# Patient Record
Sex: Male | Born: 1945
Health system: Southern US, Community
[De-identification: ages and names within clinical notes are randomized; demographics above are authoritative.]

## PROBLEM LIST (undated history)

## (undated) DIAGNOSIS — N529 Male erectile dysfunction, unspecified: Secondary | ICD-10-CM

## (undated) DIAGNOSIS — IMO0002 Reserved for concepts with insufficient information to code with codable children: Secondary | ICD-10-CM

## (undated) DIAGNOSIS — F329 Major depressive disorder, single episode, unspecified: Secondary | ICD-10-CM

## (undated) DIAGNOSIS — F32A Depression, unspecified: Secondary | ICD-10-CM

## (undated) DIAGNOSIS — I639 Cerebral infarction, unspecified: Secondary | ICD-10-CM

## (undated) DIAGNOSIS — E785 Hyperlipidemia, unspecified: Secondary | ICD-10-CM

## (undated) DIAGNOSIS — R911 Solitary pulmonary nodule: Secondary | ICD-10-CM

## (undated) DIAGNOSIS — I480 Paroxysmal atrial fibrillation: Secondary | ICD-10-CM

## (undated) DIAGNOSIS — I10 Essential (primary) hypertension: Secondary | ICD-10-CM

## (undated) HISTORY — DX: Essential (primary) hypertension: I10

## (undated) HISTORY — DX: Solitary pulmonary nodule: R91.1

## (undated) HISTORY — DX: Depression, unspecified: F32.A

## (undated) HISTORY — DX: Cerebral infarction, unspecified: I63.9

## (undated) HISTORY — DX: Major depressive disorder, single episode, unspecified: F32.9

## (undated) HISTORY — DX: Paroxysmal atrial fibrillation: I48.0

## (undated) HISTORY — DX: Male erectile dysfunction, unspecified: N52.9

## (undated) HISTORY — DX: Hyperlipidemia, unspecified: E78.5

## (undated) HISTORY — DX: Reserved for concepts with insufficient information to code with codable children: IMO0002

---

## 1999-06-01 ENCOUNTER — Encounter (INDEPENDENT_AMBULATORY_CARE_PROVIDER_SITE_OTHER): Payer: Self-pay | Admitting: Specialist

## 1999-06-01 ENCOUNTER — Ambulatory Visit (HOSPITAL_COMMUNITY): Admission: RE | Admit: 1999-06-01 | Discharge: 1999-06-01 | Payer: Self-pay | Admitting: *Deleted

## 2003-04-01 ENCOUNTER — Ambulatory Visit (HOSPITAL_COMMUNITY): Admission: RE | Admit: 2003-04-01 | Discharge: 2003-04-01 | Payer: Self-pay | Admitting: *Deleted

## 2004-04-13 ENCOUNTER — Ambulatory Visit: Payer: Self-pay | Admitting: Family Medicine

## 2004-04-25 ENCOUNTER — Ambulatory Visit: Payer: Self-pay | Admitting: Family Medicine

## 2004-08-23 ENCOUNTER — Ambulatory Visit: Payer: Self-pay | Admitting: Family Medicine

## 2004-09-12 ENCOUNTER — Ambulatory Visit: Payer: Self-pay | Admitting: Family Medicine

## 2004-10-12 ENCOUNTER — Ambulatory Visit: Payer: Self-pay | Admitting: Family Medicine

## 2005-01-31 ENCOUNTER — Ambulatory Visit: Payer: Self-pay | Admitting: Family Medicine

## 2005-04-20 ENCOUNTER — Ambulatory Visit: Payer: Self-pay | Admitting: Family Medicine

## 2005-04-27 ENCOUNTER — Ambulatory Visit: Payer: Self-pay | Admitting: Family Medicine

## 2005-05-18 ENCOUNTER — Ambulatory Visit: Payer: Self-pay | Admitting: Internal Medicine

## 2005-07-06 ENCOUNTER — Ambulatory Visit: Payer: Self-pay | Admitting: Family Medicine

## 2006-03-26 ENCOUNTER — Ambulatory Visit: Payer: Self-pay | Admitting: Family Medicine

## 2006-04-22 ENCOUNTER — Ambulatory Visit: Payer: Self-pay | Admitting: Family Medicine

## 2006-04-22 LAB — CONVERTED CEMR LAB
Albumin: 3.6 g/dL (ref 3.5–5.2)
Basophils Absolute: 0.1 10*3/uL (ref 0.0–0.1)
Bilirubin, Direct: 0.1 mg/dL (ref 0.0–0.3)
Calcium: 9.2 mg/dL (ref 8.4–10.5)
Eosinophils Absolute: 0.7 10*3/uL — ABNORMAL HIGH (ref 0.0–0.6)
Eosinophils Relative: 10.2 % — ABNORMAL HIGH (ref 0.0–5.0)
GFR calc Af Amer: 98 mL/min
GFR calc non Af Amer: 81 mL/min
Glucose, Bld: 100 mg/dL — ABNORMAL HIGH (ref 70–99)
HDL: 32.3 mg/dL — ABNORMAL LOW (ref >39.0)
Lymphocytes Relative: 30.9 % (ref 12.0–46.0)
MCHC: 35.9 g/dL (ref 30.0–36.0)
MCV: 82.7 fL (ref 78.0–100.0)
Neutro Abs: 3.8 10*3/uL (ref 1.4–7.7)
Neutrophils Relative %: 52.3 % (ref 43.0–77.0)
PSA: 1.14 ng/mL (ref 0.10–4.00)
Platelets: 231 10*3/uL (ref 150–400)
Sodium: 146 meq/L — ABNORMAL HIGH (ref 135–145)
TSH: 1.69 microintl units/mL (ref 0.35–5.50)
Triglycerides: 249 mg/dL (ref 0–149)
WBC: 7.2 10*3/uL (ref 4.5–10.5)

## 2006-04-29 ENCOUNTER — Ambulatory Visit: Payer: Self-pay | Admitting: Family Medicine

## 2006-06-24 ENCOUNTER — Ambulatory Visit: Payer: Self-pay | Admitting: Family Medicine

## 2007-04-25 ENCOUNTER — Ambulatory Visit: Payer: Self-pay | Admitting: Family Medicine

## 2007-04-25 LAB — CONVERTED CEMR LAB
Albumin: 3.8 g/dL (ref 3.5–5.2)
Basophils Absolute: 0 10*3/uL (ref 0.0–0.1)
Creatinine, Ser: 1 mg/dL (ref 0.4–1.5)
Direct LDL: 97.7 mg/dL
Glucose, Urine, Semiquant: NEGATIVE
HCT: 43.6 % (ref 39.0–52.0)
Hemoglobin: 14.8 g/dL (ref 13.0–17.0)
Lymphocytes Relative: 25.2 % (ref 12.0–46.0)
MCHC: 34 g/dL (ref 30.0–36.0)
MCV: 85 fL (ref 78.0–100.0)
Monocytes Absolute: 0.5 10*3/uL (ref 0.2–0.7)
Neutrophils Relative %: 63.5 % (ref 43.0–77.0)
PSA: 1.63 ng/mL (ref 0.10–4.00)
Potassium: 4.8 meq/L (ref 3.5–5.1)
RDW: 12.6 % (ref 11.5–14.6)
Sodium: 142 meq/L (ref 135–145)
Specific Gravity, Urine: >=1.03
Total Bilirubin: 0.7 mg/dL (ref 0.3–1.2)
Total Protein: 6.6 g/dL (ref 6.0–8.3)
WBC Urine, dipstick: NEGATIVE
pH: 5

## 2007-05-02 ENCOUNTER — Ambulatory Visit: Payer: Self-pay | Admitting: Family Medicine

## 2007-05-02 DIAGNOSIS — F528 Other sexual dysfunction not due to a substance or known physiological condition: Secondary | ICD-10-CM

## 2007-05-02 DIAGNOSIS — E785 Hyperlipidemia, unspecified: Secondary | ICD-10-CM

## 2007-05-02 DIAGNOSIS — I493 Ventricular premature depolarization: Secondary | ICD-10-CM | POA: Insufficient documentation

## 2007-05-02 DIAGNOSIS — K219 Gastro-esophageal reflux disease without esophagitis: Secondary | ICD-10-CM

## 2007-07-02 DIAGNOSIS — J45909 Unspecified asthma, uncomplicated: Secondary | ICD-10-CM

## 2007-07-08 ENCOUNTER — Ambulatory Visit: Payer: Self-pay | Admitting: Family Medicine

## 2007-07-08 DIAGNOSIS — F329 Major depressive disorder, single episode, unspecified: Secondary | ICD-10-CM

## 2007-07-08 DIAGNOSIS — F3289 Other specified depressive episodes: Secondary | ICD-10-CM | POA: Insufficient documentation

## 2007-07-08 DIAGNOSIS — I10 Essential (primary) hypertension: Secondary | ICD-10-CM

## 2007-09-01 ENCOUNTER — Telehealth: Payer: Self-pay | Admitting: *Deleted

## 2007-10-09 ENCOUNTER — Encounter: Payer: Self-pay | Admitting: Family Medicine

## 2007-11-24 ENCOUNTER — Ambulatory Visit: Payer: Self-pay | Admitting: Family Medicine

## 2007-11-24 DIAGNOSIS — J45909 Unspecified asthma, uncomplicated: Secondary | ICD-10-CM | POA: Insufficient documentation

## 2008-04-27 ENCOUNTER — Ambulatory Visit: Payer: Self-pay | Admitting: Family Medicine

## 2008-04-27 LAB — CONVERTED CEMR LAB
Albumin: 3.8 g/dL (ref 3.5–5.2)
Alkaline Phosphatase: 67 units/L (ref 39–117)
BUN: 17 mg/dL (ref 6–23)
Basophils Absolute: 0 10*3/uL (ref 0.0–0.1)
Chloride: 104 meq/L (ref 96–112)
Direct LDL: 97.3 mg/dL
Eosinophils Absolute: 0.5 10*3/uL (ref 0.0–0.7)
Eosinophils Relative: 6.7 % — ABNORMAL HIGH (ref 0.0–5.0)
GFR calc Af Amer: 97 mL/min
GFR calc non Af Amer: 80 mL/min
Glucose, Urine, Semiquant: NEGATIVE
HCT: 41.3 % (ref 39.0–52.0)
MCHC: 34.9 g/dL (ref 30.0–36.0)
MCV: 84 fL (ref 78.0–100.0)
Monocytes Absolute: 0.5 10*3/uL (ref 0.1–1.0)
Neutrophils Relative %: 61.8 % (ref 43.0–77.0)
Nitrite: NEGATIVE
Platelets: 227 10*3/uL (ref 150–400)
Potassium: 4.3 meq/L (ref 3.5–5.1)
Protein, U semiquant: NEGATIVE
RDW: 12.4 % (ref 11.5–14.6)
Sodium: 145 meq/L (ref 135–145)
TSH: 1.68 microintl units/mL (ref 0.35–5.50)
Total Bilirubin: 0.6 mg/dL (ref 0.3–1.2)
Total CHOL/HDL Ratio: 5.8
Urobilinogen, UA: 0.2
VLDL: 47 mg/dL — ABNORMAL HIGH (ref 0–40)
WBC Urine, dipstick: NEGATIVE

## 2008-05-03 ENCOUNTER — Ambulatory Visit: Payer: Self-pay | Admitting: Family Medicine

## 2008-05-03 DIAGNOSIS — K921 Melena: Secondary | ICD-10-CM

## 2008-06-22 ENCOUNTER — Encounter: Payer: Self-pay | Admitting: Family Medicine

## 2008-06-22 ENCOUNTER — Ambulatory Visit: Payer: Self-pay | Admitting: Family Medicine

## 2008-06-22 DIAGNOSIS — L820 Inflamed seborrheic keratosis: Secondary | ICD-10-CM

## 2008-07-28 ENCOUNTER — Ambulatory Visit: Payer: Self-pay | Admitting: Family Medicine

## 2008-07-28 DIAGNOSIS — M5020 Other cervical disc displacement, unspecified cervical region: Secondary | ICD-10-CM

## 2008-07-29 ENCOUNTER — Ambulatory Visit: Payer: Self-pay | Admitting: Family Medicine

## 2008-08-03 ENCOUNTER — Ambulatory Visit: Payer: Self-pay | Admitting: Family Medicine

## 2008-08-13 ENCOUNTER — Encounter: Payer: Self-pay | Admitting: Family Medicine

## 2008-08-13 ENCOUNTER — Ambulatory Visit: Payer: Self-pay

## 2008-08-30 ENCOUNTER — Telehealth: Payer: Self-pay | Admitting: Family Medicine

## 2008-10-14 ENCOUNTER — Ambulatory Visit: Payer: Self-pay | Admitting: Family Medicine

## 2008-10-14 DIAGNOSIS — L259 Unspecified contact dermatitis, unspecified cause: Secondary | ICD-10-CM

## 2008-11-09 ENCOUNTER — Telehealth: Payer: Self-pay | Admitting: Family Medicine

## 2009-05-04 ENCOUNTER — Ambulatory Visit: Payer: Self-pay | Admitting: Family Medicine

## 2009-05-04 LAB — CONVERTED CEMR LAB
AST: 20 units/L (ref 0–37)
Albumin: 3.7 g/dL (ref 3.5–5.2)
Basophils Absolute: 0 10*3/uL (ref 0.0–0.1)
Bilirubin Urine: NEGATIVE
Blood in Urine, dipstick: NEGATIVE
CO2: 27 meq/L (ref 19–32)
Cholesterol: 152 mg/dL (ref 0–200)
Eosinophils Absolute: 0.6 10*3/uL (ref 0.0–0.7)
Glucose, Bld: 97 mg/dL (ref 70–99)
Glucose, Urine, Semiquant: NEGATIVE
HCT: 40.9 % (ref 39.0–52.0)
Hemoglobin: 13.6 g/dL (ref 13.0–17.0)
Lymphs Abs: 1.8 10*3/uL (ref 0.7–4.0)
MCHC: 33.3 g/dL (ref 30.0–36.0)
MCV: 87.6 fL (ref 78.0–100.0)
Monocytes Absolute: 0.4 10*3/uL (ref 0.1–1.0)
Neutro Abs: 4.4 10*3/uL (ref 1.4–7.7)
Potassium: 4 meq/L (ref 3.5–5.1)
RDW: 13.1 % (ref 11.5–14.6)
Sodium: 140 meq/L (ref 135–145)
Specific Gravity, Urine: 1.025
TSH: 1.72 microintl units/mL (ref 0.35–5.50)
WBC Urine, dipstick: NEGATIVE
pH: 5

## 2009-05-11 ENCOUNTER — Ambulatory Visit: Payer: Self-pay | Admitting: Family Medicine

## 2009-05-19 ENCOUNTER — Telehealth: Payer: Self-pay | Admitting: Family Medicine

## 2009-10-04 ENCOUNTER — Telehealth: Payer: Self-pay | Admitting: Family Medicine

## 2009-10-20 ENCOUNTER — Telehealth: Payer: Self-pay | Admitting: Family Medicine

## 2009-10-31 ENCOUNTER — Ambulatory Visit: Payer: Self-pay | Admitting: Family Medicine

## 2009-10-31 DIAGNOSIS — R51 Headache: Secondary | ICD-10-CM

## 2009-10-31 DIAGNOSIS — J309 Allergic rhinitis, unspecified: Secondary | ICD-10-CM | POA: Insufficient documentation

## 2009-11-14 ENCOUNTER — Ambulatory Visit: Payer: Self-pay | Admitting: Family Medicine

## 2009-12-20 ENCOUNTER — Ambulatory Visit: Payer: Self-pay | Admitting: Family Medicine

## 2010-01-18 ENCOUNTER — Ambulatory Visit: Payer: Self-pay | Admitting: Family Medicine

## 2010-02-22 ENCOUNTER — Telehealth: Payer: Self-pay | Admitting: Family Medicine

## 2010-03-31 ENCOUNTER — Ambulatory Visit
Admission: RE | Admit: 2010-03-31 | Discharge: 2010-03-31 | Payer: Self-pay | Source: Home / Self Care | Attending: Family Medicine | Admitting: Family Medicine

## 2010-04-06 NOTE — Assessment & Plan Note (Signed)
Summary: URI? // RS   Vital Signs:  Patient profile:   65 year old male Height:      65.5 inches Weight:      197 pounds BMI:     32.40 Temp:     98.7 degrees F oral BP sitting:   130 / 88  (left arm) Cuff size:   regular  Vitals Entered By: Kern Reap CMA Duncan Dull) (March 31, 2010 12:05 PM) CC: chest congestion, cough   CC:  chest congestion and cough.  History of Present Illness: Luke [name] is a 65 year old male, who comes in today with a two week history of wheezing and coughing.  He has a history of underlying allergic rhinitis and asthma and he is a hobby is shaggy, dancing.  He went into a establishment at [facility] to dance with a smoke and that triggered his asthma  Allergies: 1)  Aspirin (Aspirin) 2)  Avapro (Irbesartan)  Past History:  Past medical, surgical, family and social histories (including risk factors) reviewed for relevance to current acute and chronic problems.  Past Medical History: Reviewed history from 11/24/2007 and no changes required. 1980, bleeding, peptic ulcer Depression Hyperlipidemia Hypertension erectile dysfunction Asthma  Family History: Reviewed history from 05/02/2007 and no changes required. father died 61, coronary disease, peripheral vascular disease, and hyperlipidemia mother died 15, stroke, underlying hypertension, and peripheral vascular disease two brothers, one has hypertension.  The other has throat cancer and coronary disease  Social History: Reviewed history from 05/03/2008 and no changes required. Occupation: [employer] in [place] going to retire the end of this month Married Never Smoked Alcohol use-no Drug use-no Regular exercise-yes  Review of Systems      See HPI  Physical Exam  General:  Well-developed,well-nourished,in no acute distress; alert,appropriate and cooperative throughout examination Lungs:  symmetrical breath sounds delayed expiratory wheezing bilaterally   Impression &  Recommendations:  Problem # 1:  ASTHMA (ICD-493.90) Assessment Deteriorated  His updated medication list for this problem includes:    Prednisone 20 Mg Tabs (Prednisone) ..... Uad  Complete Medication List: 1)  Citalopram Hydrobromide 20 Mg Tabs (Citalopram hydrobromide) .... 1/2 once daily 2)  Simvastatin 20 Mg Tabs (Simvastatin) .... Take 1 tablet by mouth once a day 3)  Acyclovir 200 Mg/84ml Susp (Acyclovir) .... Take one tab by mouth once daily 4)  Flonase 50 Mcg/act Susp (Fluticasone propionate) .... Uad 5)  Cozaar 100 Mg Tabs (Losartan potassium) .... Take 1 tablet by mouth every morning 6)  Norvasc 5 Mg Tabs (Amlodipine besylate) .... Take 1 tablet by mouth every morning 7)  Hydrochlorothiazide 25 Mg Tabs (Hydrochlorothiazide) .... Take 1 tablet by mouth every morning as needed 8)  Prednisone 20 Mg Tabs (Prednisone) .... Uad  Patient Instructions: 1)  please reschedule.  His physical examination for the second week in March. 2)  Prednisone two tabs x 3 days, one x 3 days, a half x 3 days, then half a tablet Monday, Wednesday, Friday, for a two week taper.  Return p.r.n. Prescriptions: PREDNISONE 20 MG TABS (PREDNISONE) UAD  #50 x 1   Entered and Authorized by:   Roderick Pee MD   Signed by:   Roderick Pee MD on 03/31/2010   Method used:   Electronically to        [facility]* (retail)       [address]       [place], [place]  [address]       Ph: [phone]  Fax: [phone]   RxID:   [id number]    Orders Added: 1)  Est. Patient Level III [66440]

## 2010-04-06 NOTE — Progress Notes (Signed)
Summary: refill  Phone Note Refill Request Message from:  Fax from Pharmacy on October 04, 2009 5:30 PM  Refills Requested: Medication #1:  ZOVIRAX 200 MG  CAPS Take 1 capsule by mouth once a day Initial call taken by: Kern Reap CMA Duncan Dull),  October 04, 2009 5:30 PM    Prescriptions: ZOVIRAX 200 MG  CAPS (ACYCLOVIR) Take 1 capsule by mouth once a day  #100 x 4   Entered by:   Kern Reap CMA (AAMA)   Authorized by:   Roderick Pee MD   Signed by:   Kern Reap CMA (AAMA) on 10/04/2009   Method used:   Faxed to ...       [facility]* (retail)             ,          Ph: [phone]       Fax: [phone]   RxID:   [id number]

## 2010-04-06 NOTE — Progress Notes (Signed)
Summary: REFILLS  Phone Note Refill Request Message from:  Fax from Pharmacy  Refills Requested: Medication #1:  COZAAR 50 MG TABS Take 1 tablet by mouth every morning.   Brand Name Necessary? No  Medication #2:  ZOVIRAX 200 MG  CAPS Take 1 capsule by mouth once a day  Medication #3:  CITALOPRAM HYDROBROMIDE 20 MG  TABS 1/2 once daily  Medication #4:  SIMVASTATIN 20 MG  TABS Take 1 tablet by mouth once a day [facility] FAX---[phone]  Initial call taken by: Warnell Forester,  May 19, 2009 3:22 PM    Prescriptions: CITALOPRAM HYDROBROMIDE 20 MG  TABS (CITALOPRAM HYDROBROMIDE) 1/2 once daily  #50 x 4   Entered by:   Kern Reap CMA (AAMA)   Authorized by:   Roderick Pee MD   Signed by:   Kern Reap CMA (AAMA) on 05/19/2009   Method used:   Electronically to        [facility]* (mail-order)             ,          Ph: [phone]       Fax: [phone]   RxID:   [id number] COZAAR 50 MG TABS (LOSARTAN POTASSIUM) Take 1 tablet by mouth every morning  #100 x 3   Entered by:   Kern Reap CMA (AAMA)   Authorized by:   Roderick Pee MD   Signed by:   Kern Reap CMA (AAMA) on 05/19/2009   Method used:   Electronically to        [facility]* (mail-order)             ,          Ph: [phone]       Fax: [phone]   RxID:   [id number] SIMVASTATIN 20 MG  TABS (SIMVASTATIN) Take 1 tablet by mouth once a day  #100 x 4   Entered by:   Kern Reap CMA (AAMA)   Authorized by:   Roderick Pee MD   Signed by:   Kern Reap CMA (AAMA) on 05/19/2009   Method used:   Electronically to        [facility]* (mail-order)             ,          Ph: [phone]       Fax: [phone]   RxID:   [id number] ZOVIRAX 200 MG  CAPS (ACYCLOVIR) Take 1 capsule by mouth once a day  #100 x 4   Entered by:   Kern Reap CMA (AAMA)   Authorized by:   Roderick Pee MD   Signed by:   Kern Reap CMA (AAMA) on 05/19/2009   Method used:    Electronically to        [facility]* (mail-order)             ,          Ph: [phone]       Fax: [phone]   RxID:   [id number]

## 2010-04-06 NOTE — Assessment & Plan Note (Signed)
Summary: ?SINUS INF/NJR   Vital Signs:  Patient profile:   65 year old male Weight:      193 pounds Temp:     99.1 degrees F oral BP sitting:   140 / 96  (left arm) Cuff size:   regular  Vitals Entered By: Kern Reap CMA Duncan Dull) (October 31, 2009 10:32 AM) CC: dull headaches   CC:  dull headaches.  History of Present Illness: Luke [name] is a 65 year old, married man nonsmoker, who comes in today with a 3 month history of bifrontal headaches.  He states about 3 months ago he noticed some pressure sensation in both frontal sinuses.  It's been constant since that time.  He rates the discomfort as a 3 on a scale of one to 10.  He sleeps well.  It doesn't wake him up at night nor does he have any neurologic symptoms.  He does have a history of allergic rhinitis.  Also he has trouble in the summer with air conditioning.  He states the air conditioning causes a lot of nasal congestion.  His blood pressures to Cozaar 50 mg daily.  BP today 140/96.  He takes his medication daily.  Again.  Neurologic review of systems negative  Allergies: 1)  Aspirin (Aspirin) 2)  Avapro (Irbesartan)  Past History:  Past medical, surgical, family and social histories (including risk factors) reviewed for relevance to current acute and chronic problems.  Past Medical History: Reviewed history from 11/24/2007 and no changes required. 1980, bleeding, peptic ulcer Depression Hyperlipidemia Hypertension erectile dysfunction Asthma  Family History: Reviewed history from 05/02/2007 and no changes required. father died 10, coronary disease, peripheral vascular disease, and hyperlipidemia mother died 80, stroke, underlying hypertension, and peripheral vascular disease two brothers, one has hypertension.  The other has throat cancer and coronary disease  Social History: Reviewed history from 05/03/2008 and no changes required. Occupation: [employer] in [place] going to retire the end of this  month Married Never Smoked Alcohol use-no Drug use-no Regular exercise-yes  Review of Systems      See HPI  Physical Exam  General:  Well-developed,well-nourished,in no acute distress; alert,appropriate and cooperative throughout examination Head:  Normocephalic and atraumatic without obvious abnormalities. No apparent alopecia or balding. Eyes:  No corneal or conjunctival inflammation noted. EOMI. Perrla. Funduscopic exam benign, without hemorrhages, exudates or papilledema. Vision grossly normal. Ears:  External ear exam shows no significant lesions or deformities.  Otoscopic examination reveals clear canals, tympanic membranes are intact bilaterally without bulging, retraction, inflammation or discharge. Hearing is grossly normal bilaterally. Nose:  the septum is in the midline is 3+ nasal edema bilaterally Mouth:  Oral mucosa and oropharynx without lesions or exudates.  Teeth in good repair. Neck:  No deformities, masses, or tenderness noted. Lungs:  Normal respiratory effort, chest expands symmetrically. Lungs are clear to auscultation, no crackles or wheezes. Neurologic:  No cranial nerve deficits noted. Station and gait are normal. Plantar reflexes are down-going bilaterally. DTRs are symmetrical throughout. Sensory, motor and coordinative functions appear intact.   Problems:  Medical Problems Added: 1)  Dx of Headache  (ICD-784.0) 2)  Dx of Rhinitis  (ICD-477.9)  Impression & Recommendations:  Problem # 1:  HYPERTENSION (ICD-401.9) Assessment Deteriorated  His updated medication list for this problem includes:    Cozaar 50 Mg Tabs (Losartan potassium) .Marland Kitchen... Take 1 tablet by mouth every morning  Problem # 2:  RHINITIS (ICD-477.9) Assessment: New  His updated medication list for this problem includes:    Flonase 50  Mcg/act Susp (Fluticasone propionate) ..... Uad  Problem # 3:  HEADACHE (ICD-784.0) Assessment: New  Complete Medication List: 1)  Citalopram  Hydrobromide 20 Mg Tabs (Citalopram hydrobromide) .... 1/2 once daily 2)  Simvastatin 20 Mg Tabs (Simvastatin) .... Take 1 tablet by mouth once a day 3)  Cozaar 50 Mg Tabs (Losartan potassium) .... Take 1 tablet by mouth every morning 4)  Acyclovir 200 Mg/64ml Susp (Acyclovir) .... Take one tab by mouth once daily 5)  Flonase 50 Mcg/act Susp (Fluticasone propionate) .... Uad  Patient Instructions: 1)  take 10 mg of plain Claritin at bedtime, and one shot of steroid nasal spray up each nostril at bedtime. 2)  Continued D. Cozaar one tablet daily in the morning.  Check your blood pressure twice daily.  Return in two weeks for follow-up. Prescriptions: FLONASE 50 MCG/ACT SUSP (FLUTICASONE PROPIONATE) UAD  #2 units x 6   Entered and Authorized by:   Roderick Pee MD   Signed by:   Roderick Pee MD on 10/31/2009   Method used:   Electronically to        [facility]* (retail)       [address]       [place], [place]  [address]       Ph: [phone]       Fax: [phone]   RxID:   [id number]

## 2010-04-06 NOTE — Assessment & Plan Note (Signed)
Summary: cpx/njr   Vital Signs:  Patient profile:   65 year old male Height:      65.5 inches Weight:      188 pounds BMI:     30.92 Temp:     98.5 degrees F oral BP sitting:   120 / 88  (left arm) Cuff size:   regular  Vitals Entered By: Kern Reap CMA Duncan Dull) (May 11, 2009 9:07 AM)  Reason for Visit cpx  History of Present Illness: niles is a 65 year old, married male, nonsmoker, who comes in today for evaluation of multiple issues.  He has recurrent HSV, for which he takes Zovirax, 200 mg daily asymptomatic on medication.  His history of mild depression, for which he takes Celexa 10 mg daily working well.  Feels well.  Mood good.  He takes simvastatin 20 mg nightly for hyperlipidemia.  Will check lipids.  He takes Benicar 20 mg daily for hypertension.  BP normal 120/88.  Will switch to Cozaar because of cost issue.  History tonight here dental care.  Colonoscopy normal.  Tetanus 2005.  He declines a seasonal flu shot.  He states he wants got the shot and then got the flu.  I explained to them why that was.  Advised annual flu shot.  Allergies: 1)  Aspirin (Aspirin) 2)  Avapro (Irbesartan)  Past History:  Past medical, surgical, family and social histories (including risk factors) reviewed, and no changes noted (except as noted below).  Past Medical History: Reviewed history from 11/24/2007 and no changes required. 1980, bleeding, peptic ulcer Depression Hyperlipidemia Hypertension erectile dysfunction Asthma  Family History: Reviewed history from 05/02/2007 and no changes required. father died 76, coronary disease, peripheral vascular disease, and hyperlipidemia mother died 85, stroke, underlying hypertension, and peripheral vascular disease two brothers, one has hypertension.  The other has throat cancer and coronary disease  Social History: Reviewed history from 05/03/2008 and no changes required. Occupation: [employer] in [place] going to retire the end of  this month Married Never Smoked Alcohol use-no Drug use-no Regular exercise-yes  Review of Systems      See HPI  Physical Exam  General:  Well-developed,well-nourished,in no acute distress; alert,appropriate and cooperative throughout examination Head:  Normocephalic and atraumatic without obvious abnormalities. No apparent alopecia or balding. Eyes:  No corneal or conjunctival inflammation noted. EOMI. Perrla. Funduscopic exam benign, without hemorrhages, exudates or papilledema. Vision grossly normal. Ears:  External ear exam shows no significant lesions or deformities.  Otoscopic examination reveals clear canals, tympanic membranes are intact bilaterally without bulging, retraction, inflammation or discharge. Hearing is grossly normal bilaterally. Nose:  External nasal examination shows no deformity or inflammation. Nasal mucosa are pink and moist without lesions or exudates. Mouth:  Oral mucosa and oropharynx without lesions or exudates.  Teeth in good repair. Neck:  No deformities, masses, or tenderness noted. Chest Wall:  No deformities, masses, tenderness or gynecomastia noted. Breasts:  No masses or gynecomastia noted Lungs:  Normal respiratory effort, chest expands symmetrically. Lungs are clear to auscultation, no crackles or wheezes. Heart:  Normal rate and regular rhythm. S1 and S2 normal without gallop, murmur, click, rub or other extra sounds. Abdomen:  Bowel sounds positive,abdomen soft and non-tender without masses, organomegaly or hernias noted. Rectal:  No external abnormalities noted. Normal sphincter tone. No rectal masses or tenderness. Genitalia:  Testes bilaterally descended without nodularity, tenderness or masses. No scrotal masses or lesions. No penis lesions or urethral discharge. Prostate:  Prostate gland firm and smooth, no enlargement,  nodularity, tenderness, mass, asymmetry or induration. Msk:  No deformity or scoliosis noted of thoracic or lumbar spine.     Pulses:  R and L carotid,radial,femoral,dorsalis pedis and posterior tibial pulses are full and equal bilaterally Extremities:  No clubbing, cyanosis, edema, or deformity noted with normal full range of motion of all joints.   Neurologic:  No cranial nerve deficits noted. Station and gait are normal. Plantar reflexes are down-going bilaterally. DTRs are symmetrical throughout. Sensory, motor and coordinative functions appear intact. Skin:  Intact without suspicious lesions or rashes Cervical Nodes:  No lymphadenopathy noted Axillary Nodes:  No palpable lymphadenopathy Inguinal Nodes:  No significant adenopathy Psych:  Cognition and judgment appear intact. Alert and cooperative with normal attention span and concentration. No apparent delusions, illusions, hallucinations   Impression & Recommendations:  Problem # 1:  HYPERTENSION (ICD-401.9) Assessment Improved  The following medications were removed from the medication list:    Benicar 40 Mg Tabs (Olmesartan medoxomil) ..... .1/2 qam His updated medication list for this problem includes:    Cozaar 50 Mg Tabs (Losartan potassium) .Marland Kitchen... Take 1 tablet by mouth every morning  Orders: Prescription Created Electronically [phone]) EKG w/ Interpretation (93000)  Problem # 2:  DEPRESSION (ICD-311) Assessment: Improved  His updated medication list for this problem includes:    Citalopram Hydrobromide 20 Mg Tabs (Citalopram hydrobromide) .Marland Kitchen... 1/2 once daily  Orders: Prescription Created Electronically [phone])  Problem # 3:  HYPERLIPIDEMIA (ICD-272.4) Assessment: Improved  His updated medication list for this problem includes:    Simvastatin 20 Mg Tabs (Simvastatin) .Marland Kitchen... Take 1 tablet by mouth once a day  Orders: Prescription Created Electronically [phone])  Problem # 4:  Preventive Health Care (ICD-V70.0) Assessment: Unchanged  Complete Medication List: 1)  Zovirax 200 Mg Caps (Acyclovir) .... Take 1 capsule by mouth once a  day 2)  Citalopram Hydrobromide 20 Mg Tabs (Citalopram hydrobromide) .... 1/2 once daily 3)  Simvastatin 20 Mg Tabs (Simvastatin) .... Take 1 tablet by mouth once a day 4)  Cozaar 50 Mg Tabs (Losartan potassium) .... Take 1 tablet by mouth every morning  Patient Instructions: 1)  Please schedule a follow-up appointment in 1 year. 2)  It is important that you exercise regularly at least 20 minutes 5 times a week. If you develop chest pain, have severe difficulty breathing, or feel very tired , stop exercising immediately and seek medical attention. 3)  Schedule a colonoscopy/sigmoidoscopy to help detect colon cancer. 4)  Take an Aspirin every day. Prescriptions: SIMVASTATIN 20 MG  TABS (SIMVASTATIN) Take 1 tablet by mouth once a day  #100 x 4   Entered and Authorized by:   Roderick Pee MD   Signed by:   Roderick Pee MD on 05/11/2009   Method used:   Electronically to        [facility]* (retail)       [address]       [place], [place]  [address]       Ph: [phone]       Fax: [phone]   RxID:   [id number] CITALOPRAM HYDROBROMIDE 20 MG  TABS (CITALOPRAM HYDROBROMIDE) 1/2 once daily  #50 x 4   Entered and Authorized by:   Roderick Pee MD   Signed by:   Roderick Pee MD on 05/11/2009   Method used:   Electronically to        [facility]* (retail)       [address]  [place], [place]  [address]       Ph: [phone]       Fax: [phone]   RxID:   [id number] ZOVIRAX 200 MG  CAPS (ACYCLOVIR) Take 1 capsule by mouth once a day  #100 x 4   Entered and Authorized by:   Roderick Pee MD   Signed by:   Roderick Pee MD on 05/11/2009   Method used:   Electronically to        [facility]* (retail)       [address]       [place], [place]  [address]       Ph: [phone]       Fax: [phone]   RxID:   [id number] COZAAR 50 MG TABS (LOSARTAN POTASSIUM) Take 1 tablet by mouth every morning  #100 x  3   Entered and Authorized by:   Roderick Pee MD   Signed by:   Roderick Pee MD on 05/11/2009   Method used:   Electronically to        [facility]* (retail)       [address]       [place], [place]  [address]       Ph: [phone]       Fax: [phone]   RxID:   [id number]

## 2010-04-06 NOTE — Progress Notes (Signed)
Summary: hctz refill  Phone Note Refill Request Message from:  Fax from Pharmacy on February 22, 2010 2:44 PM  Refills Requested: Medication #1:  HYDROCHLOROTHIAZIDE 25 MG TABS Take 1 tablet by mouth every morning as needed. Initial call taken by: Kern Reap CMA Duncan Dull),  February 22, 2010 2:44 PM    Prescriptions: HYDROCHLOROTHIAZIDE 25 MG TABS (HYDROCHLOROTHIAZIDE) Take 1 tablet by mouth every morning as needed  #100 x 2   Entered by:   Kern Reap CMA (AAMA)   Authorized by:   Roderick Pee MD   Signed by:   Kern Reap CMA (AAMA) on 02/22/2010   Method used:   Electronically to        [facility]* (retail)             ,          Ph: [phone]       Fax: [phone]   RxID:   [id number]

## 2010-04-06 NOTE — Assessment & Plan Note (Signed)
Summary: 1 month rov/njr----PT [facility] // RS   Vital Signs:  Patient profile:   65 year old male Weight:      197 pounds Temp:     98.8 degrees F oral BP sitting:   160 / 100  (left arm) Cuff size:   regular  Vitals Entered By: Kern Reap CMA Duncan Dull) (December 20, 2009 10:58 AM) CC: follow-up visit   CC:  follow-up visit.  History of Present Illness: Luke [name] comes in today for evaluation of hypertension.  We increased his Cozaar to 100 mg daily a couple weeks ago.  He is here for follow-up.  He states his blood pressure still elevated systolics running 140 to 160 diastolics running 85 to 95  Allergies: 1)  Aspirin (Aspirin) 2)  Avapro (Irbesartan)  Review of Systems      See HPI       Flu Vaccine Consent Questions     Do you have a history of severe allergic reactions to this vaccine? no    Any prior history of allergic reactions to egg and/or gelatin? no    Do you have a sensitivity to the preservative Thimersol? no    Do you have a past history of Guillan-Barre Syndrome? no    Do you currently have an acute febrile illness? no    Have you ever had a severe reaction to latex? no    Vaccine information given and explained to patient? yes    Are you currently pregnant? no    Lot Number:[id number]   Exp Date:09/02/2010   Site Given  Left Deltoid IM   Physical Exam  General:  Well-developed,well-nourished,in no acute distress; alert,appropriate and cooperative throughout examination   Impression & Recommendations:  Problem # 1:  HYPERTENSION (ICD-401.9) Assessment Unchanged  His updated medication list for this problem includes:    Cozaar 100 Mg Tabs (Losartan potassium) .Marland Kitchen... Take 1 tablet by mouth every morning    Norvasc 5 Mg Tabs (Amlodipine besylate) .Marland Kitchen... Take 1 tablet by mouth every morning  Complete Medication List: 1)  Citalopram Hydrobromide 20 Mg Tabs (Citalopram hydrobromide) .... 1/2 once daily 2)  Simvastatin 20 Mg Tabs (Simvastatin) .... Take 1 tablet  by mouth once a day 3)  Acyclovir 200 Mg/60ml Susp (Acyclovir) .... Take one tab by mouth once daily 4)  Flonase 50 Mcg/act Susp (Fluticasone propionate) .... Uad 5)  Cozaar 100 Mg Tabs (Losartan potassium) .... Take 1 tablet by mouth every morning 6)  Norvasc 5 Mg Tabs (Amlodipine besylate) .... Take 1 tablet by mouth every morning  Other Orders: Admin 1st Vaccine ([address]) Flu Vaccine 46yrs + (60454)  Patient Instructions: 1)  add Norvasc to 5 mg daily in the morning.  Check y  blood pressure daily in the morning.  Return in 4 weeks for follow-up 2)  Remember ............complete salt free diet Prescriptions: NORVASC 5 MG TABS (AMLODIPINE BESYLATE) Take 1 tablet by mouth every morning  #100 x 3   Entered and Authorized by:   Roderick Pee MD   Signed by:   Roderick Pee MD on 12/20/2009   Method used:   Electronically to        [facility]* (retail)       [address]       [place], [place]  [address]       Ph: [phone]       Fax: [phone]   RxID:   [id number]    Orders Added: 1)  Admin 1st Vaccine [  90471] 2)  Flu Vaccine 61yrs + [90658] 3)  Est. Patient Level III [16109]

## 2010-04-06 NOTE — Assessment & Plan Note (Signed)
Summary: 1 month rov/njr   Vital Signs:  Patient profile:   65 year old male Weight:      199 pounds Temp:     98.1 degrees F oral BP sitting:   130 / 92  (left arm) Cuff size:   regular  Vitals Entered By: Kern Reap CMA Duncan Dull) (January 18, 2010 8:24 AM) CC: follow-up visit   CC:  follow-up visit.  History of Present Illness: Luke [name] is a 65 year old, married male, nonsmoker, who comes in today for follow-up of hypertension.  He was on Cozaar 100 mg daily, however, was not holding his blood pressure.  On October the 18th we added Norvasc 5 mg daily.  Since that time.  His blood pressure slowly dropped and now is back to normal.  Averaging 130/80.  No side effects except for some occasional flushing and some occasional fluid retention.  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  Allergies: 1)  Aspirin (Aspirin) 2)  Avapro (Irbesartan)  Past History:  Past medical, surgical, family and social histories (including risk factors) reviewed for relevance to current acute and chronic problems.  Past Medical History: Reviewed history from 11/24/2007 and no changes required. 1980, bleeding, peptic ulcer Depression Hyperlipidemia Hypertension erectile dysfunction Asthma  Family History: Reviewed history from 05/02/2007 and no changes required. father died 75, coronary disease, peripheral vascular disease, and hyperlipidemia mother died 52, stroke, underlying hypertension, and peripheral vascular disease two brothers, one has hypertension.  The other has throat cancer and coronary disease  Social History: Reviewed history from 05/03/2008 and no changes required. Occupation: [employer] in [place] going to retire the end of this month Married Never Smoked Alcohol use-no Drug use-no Regular exercise-yes  Review of Systems      See HPI  Physical Exam  General:  Well-developed,well-nourished,in no acute distress; alert,appropriate and cooperative throughout  examination   Impression & Recommendations:  Problem # 1:  HYPERTENSION (ICD-401.9) Assessment Improved  His updated medication list for this problem includes:    Cozaar 100 Mg Tabs (Losartan potassium) .Marland Kitchen... Take 1 tablet by mouth every morning    Norvasc 5 Mg Tabs (Amlodipine besylate) .Marland Kitchen... Take 1 tablet by mouth every morning    Hydrochlorothiazide 25 Mg Tabs (Hydrochlorothiazide) .Marland Kitchen... Take 1 tablet by mouth every morning as needed  Complete Medication List: 1)  Citalopram Hydrobromide 20 Mg Tabs (Citalopram hydrobromide) .... 1/2 once daily 2)  Simvastatin 20 Mg Tabs (Simvastatin) .... Take 1 tablet by mouth once a day 3)  Acyclovir 200 Mg/24ml Susp (Acyclovir) .... Take one tab by mouth once daily 4)  Flonase 50 Mcg/act Susp (Fluticasone propionate) .... Uad 5)  Cozaar 100 Mg Tabs (Losartan potassium) .... Take 1 tablet by mouth every morning 6)  Norvasc 5 Mg Tabs (Amlodipine besylate) .... Take 1 tablet by mouth every morning 7)  Hydrochlorothiazide 25 Mg Tabs (Hydrochlorothiazide) .... Take 1 tablet by mouth every morning as needed  Patient Instructions: 1)  continue current medications.  Check your blood pressure daily in the morning ...........on Sunday 2)  If you get an elevated reading then check your blood pressure  daily for two weeks in a row.  If after two weeks [place]. [place]. all your blood pressures were back to normal then, just go back and check it  weekly.  If however, u  see a persistent elevation bring the data and the device and come back to see [place] so we can readjust y  medication Prescriptions: HYDROCHLOROTHIAZIDE 25 MG TABS (  HYDROCHLOROTHIAZIDE) Take 1 tablet by mouth every morning as needed  #100 x 2   Entered and Authorized by:   Roderick Pee MD   Signed by:   Roderick Pee MD on 01/18/2010   Method used:   Print then Give to Patient   RxID:   [id number]    Orders Added: 1)  Est. Patient Level III [25427]

## 2010-04-06 NOTE — Progress Notes (Signed)
Summary: rx change  Phone Note From Pharmacy   Summary of Call: patient would like to change from zovirax to acyclovir - [facility] Initial call taken by: Kern Reap CMA Duncan Dull),  October 20, 2009 1:54 PM    New/Updated Medications: ACYCLOVIR 200 MG/5ML SUSP (ACYCLOVIR) take one tab by mouth once daily

## 2010-04-06 NOTE — Assessment & Plan Note (Signed)
Summary: 2 week fup//ccm   Vital Signs:  Patient profile:   65 year old male Weight:      194 pounds Temp:     98.2 degrees F oral BP sitting:   130 / 90  (right arm) Cuff size:   regular  Vitals Entered By: Kathrynn Speed CMA (November 14, 2009 8:52 AM) CC: 2 wk fup BP & headaches, src Is Patient Diabetic? No   CC:  2 wk fup BP & headaches and src.  History of Present Illness: Luke [name] is a 65 year old, married male, nonsmoker, who comes in today for evaluation of two problems.  His blood pressure has been reading different numbers at home however, he has an old machine.  BP checked by me today 160/90 right arm sitting position on Cozaar 50 mg daily.  Will increase Cozaar 200 mg tablets daily.  Have him get a new digital blood pressure cuff, and follow-up in 4 weeks.  He also has underlying allergic rhinitis.  We added Flonase nasal spray in August.  His symptoms have markedly decreased.  No side effects from medication  Preventive Screening-Counseling & Management  Alcohol-Tobacco     Smoking Status: never     Passive Smoke Exposure: yes  Current Medications (verified): 1)  Citalopram Hydrobromide 20 Mg  Tabs (Citalopram Hydrobromide) .... 1/2 Once Daily 2)  Simvastatin 20 Mg  Tabs (Simvastatin) .... Take 1 Tablet By Mouth Once A Day 3)  Cozaar 50 Mg Tabs (Losartan Potassium) .... Take 1 Tablet By Mouth Every Morning 4)  Acyclovir 200 Mg/37ml Susp (Acyclovir) .... Take One Tab By Mouth Once Daily 5)  Flonase 50 Mcg/act Susp (Fluticasone Propionate) .... Uad  Allergies (verified): 1)  Aspirin (Aspirin) 2)  Avapro (Irbesartan)  Review of Systems      See HPI  Physical Exam  General:  Well-developed,well-nourished,in no acute distress; alert,appropriate and cooperative throughout examination Heart:  160/94 right arm sitting position   Impression & Recommendations:  Problem # 1:  HEADACHE (ICD-784.0) Assessment Unchanged  Orders: Prescription Created  Electronically [phone])  Problem # 2:  RHINITIS (ICD-477.9) Assessment: Improved  His updated medication list for this problem includes:    Flonase 50 Mcg/act Susp (Fluticasone propionate) ..... Uad  Orders: Prescription Created Electronically [id number])  Problem # 3:  HYPERTENSION (ICD-401.9) Assessment: Deteriorated  The following medications were removed from the medication list:    Cozaar 50 Mg Tabs (Losartan potassium) .Marland Kitchen... Take 1 tablet by mouth every morning His updated medication list for this problem includes:    Cozaar 100 Mg Tabs (Losartan potassium) .Marland Kitchen... Take 1 tablet by mouth every morning  Orders: Prescription Created Electronically [phone])  Complete Medication List: 1)  Citalopram Hydrobromide 20 Mg Tabs (Citalopram hydrobromide) .... 1/2 once daily 2)  Simvastatin 20 Mg Tabs (Simvastatin) .... Take 1 tablet by mouth once a day 3)  Acyclovir 200 Mg/58ml Susp (Acyclovir) .... Take one tab by mouth once daily 4)  Flonase 50 Mcg/act Susp (Fluticasone propionate) .... Uad 5)  Cozaar 100 Mg Tabs (Losartan potassium) .... Take 1 tablet by mouth every morning  Patient Instructions: 1)  continue her allergy medication. 2)  Increase the Cozaar to 100 mg daily check her blood pressure daily in the morning.  Return in 4 weeks with the data and the new digital pump up  device Prescriptions: COZAAR 100 MG TABS (LOSARTAN POTASSIUM) Take 1 tablet by mouth every morning  #100 x 3   Entered and Authorized by:   Roderick Pee MD  Signed by:   Roderick Pee MD on 11/14/2009   Method used:   Electronically to        [facility]* (retail)       [address]       [place], [place]  [id number]       Ph: [phone]       Fax: [phone]   RxID:   [id number]

## 2010-05-15 ENCOUNTER — Other Ambulatory Visit (INDEPENDENT_AMBULATORY_CARE_PROVIDER_SITE_OTHER): Payer: PRIVATE HEALTH INSURANCE | Admitting: Family Medicine

## 2010-05-15 DIAGNOSIS — E785 Hyperlipidemia, unspecified: Secondary | ICD-10-CM

## 2010-05-15 DIAGNOSIS — Z0000 Encounter for general adult medical examination without abnormal findings: Secondary | ICD-10-CM

## 2010-05-15 LAB — LIPID PANEL
HDL: 30.1 mg/dL — ABNORMAL LOW (ref >39.00)
Triglycerides: 238 mg/dL — ABNORMAL HIGH (ref 0.0–149.0)

## 2010-05-15 LAB — HEPATIC FUNCTION PANEL
ALT: 19 U/L (ref 0–53)
AST: 19 U/L (ref 0–37)
Alkaline Phosphatase: 64 U/L (ref 39–117)
Bilirubin, Direct: 0.1 mg/dL (ref 0.0–0.3)
Total Bilirubin: 0.5 mg/dL (ref 0.3–1.2)

## 2010-05-15 LAB — CBC WITH DIFFERENTIAL/PLATELET
Eosinophils Relative: 5.9 % — ABNORMAL HIGH (ref 0.0–5.0)
Lymphocytes Relative: 24.6 % (ref 12.0–46.0)
Monocytes Relative: 5.8 % (ref 3.0–12.0)
Neutrophils Relative %: 63.2 % (ref 43.0–77.0)
Platelets: 255 10*3/uL (ref 150.0–400.0)
WBC: 8.7 10*3/uL (ref 4.5–10.5)

## 2010-05-15 LAB — BASIC METABOLIC PANEL
BUN: 17 mg/dL (ref 6–23)
Creatinine, Ser: 1.1 mg/dL (ref 0.4–1.5)
GFR: 71.59 mL/min (ref >60.00)

## 2010-05-15 LAB — POCT URINALYSIS DIPSTICK
Ketones, UA: NEGATIVE
Protein, UA: NEGATIVE
Spec Grav, UA: 1.025

## 2010-05-15 LAB — LDL CHOLESTEROL, DIRECT: Direct LDL: 87 mg/dL

## 2010-05-15 LAB — PSA: PSA: 1.14 ng/mL (ref 0.10–4.00)

## 2010-05-16 ENCOUNTER — Other Ambulatory Visit: Payer: Self-pay

## 2010-05-16 ENCOUNTER — Encounter: Payer: Self-pay | Admitting: Family Medicine

## 2010-05-17 ENCOUNTER — Ambulatory Visit (INDEPENDENT_AMBULATORY_CARE_PROVIDER_SITE_OTHER): Payer: PRIVATE HEALTH INSURANCE | Admitting: Family Medicine

## 2010-05-17 ENCOUNTER — Encounter: Payer: Self-pay | Admitting: Family Medicine

## 2010-05-17 VITALS — BP 114/80 | HR 72 | Temp 98.5°F | Ht 66.0 in | Wt 196.0 lb

## 2010-05-17 DIAGNOSIS — F528 Other sexual dysfunction not due to a substance or known physiological condition: Secondary | ICD-10-CM

## 2010-05-17 DIAGNOSIS — B009 Herpesviral infection, unspecified: Secondary | ICD-10-CM

## 2010-05-17 DIAGNOSIS — I10 Essential (primary) hypertension: Secondary | ICD-10-CM

## 2010-05-17 DIAGNOSIS — F329 Major depressive disorder, single episode, unspecified: Secondary | ICD-10-CM

## 2010-05-17 DIAGNOSIS — J309 Allergic rhinitis, unspecified: Secondary | ICD-10-CM

## 2010-05-17 DIAGNOSIS — E785 Hyperlipidemia, unspecified: Secondary | ICD-10-CM

## 2010-05-17 MED ORDER — ACYCLOVIR 200 MG PO CAPS: 200.0000 mg | ORAL_CAPSULE | Freq: Every day | ORAL | Status: DC

## 2010-05-17 MED ORDER — SIMVASTATIN 20 MG PO TABS: 20.0000 mg | ORAL_TABLET | Freq: Every day | ORAL | Status: DC

## 2010-05-17 MED ORDER — ALBUTEROL SULFATE HFA 108 (90 BASE) MCG/ACT IN AERS: 2.0000 | INHALATION_SPRAY | Freq: Four times a day (QID) | RESPIRATORY_TRACT | Status: DC | PRN

## 2010-05-17 MED ORDER — CITALOPRAM HYDROBROMIDE 20 MG PO TABS: 10.0000 mg | ORAL_TABLET | Freq: Every day | ORAL | Status: DC

## 2010-05-17 MED ORDER — SILDENAFIL CITRATE 50 MG PO TABS: 50.0000 mg | ORAL_TABLET | ORAL | Status: DC | PRN

## 2010-05-17 MED ORDER — LOSARTAN POTASSIUM 100 MG PO TABS: 100.0000 mg | ORAL_TABLET | Freq: Every day | ORAL | Status: DC

## 2010-05-17 MED ORDER — FLUTICASONE PROPIONATE 50 MCG/ACT NA SUSP: 2.0000 | Freq: Every day | NASAL | Status: DC

## 2010-05-17 MED ORDER — AMLODIPINE BESYLATE 5 MG PO TABS: 5.0000 mg | ORAL_TABLET | Freq: Every day | ORAL | Status: DC

## 2010-05-17 NOTE — Patient Instructions (Signed)
Continue your current medications.  The dose of Viagra is a 50-mg tab......... One half tablet an hour  Prior to sex with water.  Take over the counter Prilosec 20 mg twice daily.  We will do to set up for a GI consult because of the dysphagia.  Return in one year for your annual exam

## 2010-05-17 NOTE — Progress Notes (Signed)
  Subjective:    Patient ID: Luke [name], male    DOB: 13-Apr-1945, 65 y.o.   MRN: [id number]  HPI Luke [name] is a 65 year old, married male, nonsmoker, who comes in today for evaluation of hypertension, depression, allergic rhinitis, hyperlipidemia, erectile dysfunction, reflux, esophagitis, which he states has gotten worse and now is having difficulty swallowing For hypertension.  He takes Norvasc 5 mg daily, and Cozaar 100 mg daily BP 114/80.  He takes acyclovir 200 mg daily to prevent herpes.  He takes Celexa 20 mg dose one half tab nightly for mild depression.  Doing well.  Mood good.  He uses over-the-counter Claritin, along with a steroid nasal spray for allergic rhinitis.  He takes simvastatin 20 mg nightly for hyperlipidemia.  Lipids are goal.  He has trouble with reflux esophagitis and recently is having trouble swallowing and food getting stuck in his upper esophagus.  He is also having more trouble with erectile dysfunction and would like to discuss medication  Review of Systems  Constitutional: Negative.   HENT: Negative.   Eyes: Negative.   Respiratory: Negative.   Cardiovascular: Negative.   Gastrointestinal: Negative.   Genitourinary: Negative.   Musculoskeletal: Negative.   Skin: Negative.   Neurological: Negative.   Hematological: Negative.   Psychiatric/Behavioral: Negative.        Objective:   Physical Exam  Constitutional: He is oriented to person, place, and time. He appears well-developed and well-nourished.  HENT:  Head: Normocephalic and atraumatic.  Right Ear: External ear normal.  Left Ear: External ear normal.  Nose: Nose normal.  Mouth/Throat: Oropharynx is clear and moist.  Eyes: Conjunctivae and EOM are normal. Pupils are equal, round, and reactive to light.  Neck: Normal range of motion. Neck supple. No JVD present. No tracheal deviation present. No thyromegaly present.  Cardiovascular: Normal rate, regular rhythm, normal heart sounds and  intact distal pulses.  Exam reveals no gallop and no friction rub.   No murmur heard. Pulmonary/Chest: Effort normal and breath sounds normal. No stridor. No respiratory distress. He has no wheezes. He has no rales. He exhibits no tenderness.  Abdominal: Soft. Bowel sounds are normal. He exhibits no distension and no mass. There is no tenderness. There is no rebound and no guarding.  Genitourinary: Rectum normal, prostate normal and penis normal. Guaiac negative stool. No penile tenderness.  Musculoskeletal: Normal range of motion. He exhibits no edema and no tenderness.  Lymphadenopathy:    He has no cervical adenopathy.  Neurological: He is alert and oriented to person, place, and time. He has normal reflexes. No cranial nerve deficit. He exhibits normal muscle tone.  Skin: Skin is warm and dry. No rash noted. No erythema. No pallor.  Psychiatric: He has a normal mood and affect. His behavior is normal. Judgment and thought content normal.          Assessment & Plan:  Hypertension continue medication.  History of herpes.  Continue medication.  History of depression.  Continue medication.  History of allergic rhinitis.  Continue medication.  History of hyperlipidemia.  Continue Zocor 20 nightly  History of erectile dysfunction.  Begin Viagra 50 mg p.r.n.  History reflux esophagitis now with food getting stuck in his upper esophagus.  Will begin on Prilosec 20 b.i.d. And refer to GI for consultation

## 2010-08-21 ENCOUNTER — Telehealth: Payer: Self-pay | Admitting: Family Medicine

## 2010-08-21 DIAGNOSIS — E785 Hyperlipidemia, unspecified: Secondary | ICD-10-CM

## 2010-08-21 DIAGNOSIS — I10 Essential (primary) hypertension: Secondary | ICD-10-CM

## 2010-08-21 DIAGNOSIS — F329 Major depressive disorder, single episode, unspecified: Secondary | ICD-10-CM

## 2010-08-21 NOTE — Telephone Encounter (Signed)
Refill Simvastatin, his 2 bp meds, and Citalopram, generic acyclovir. Left his meds at home. He is at the [facility]. Needs 5 day supply of each. He cannot remember what meds he is taking. Please call [facility] [place] [place]ph----[phone].

## 2010-08-22 MED ORDER — SIMVASTATIN 20 MG PO TABS: 20.0000 mg | ORAL_TABLET | Freq: Every day | ORAL | Status: DC

## 2010-08-22 MED ORDER — AMLODIPINE BESYLATE 5 MG PO TABS: 5.0000 mg | ORAL_TABLET | Freq: Every day | ORAL | Status: DC

## 2010-08-22 MED ORDER — LOSARTAN POTASSIUM 100 MG PO TABS: 100.0000 mg | ORAL_TABLET | Freq: Every day | ORAL | Status: DC

## 2010-08-22 MED ORDER — ACYCLOVIR 200 MG PO CAPS: 200.0000 mg | ORAL_CAPSULE | Freq: Every day | ORAL | Status: DC

## 2010-08-22 MED ORDER — CITALOPRAM HYDROBROMIDE 20 MG PO TABS: 10.0000 mg | ORAL_TABLET | Freq: Every day | ORAL | Status: DC

## 2010-08-22 NOTE — Telephone Encounter (Signed)
Pt called back again checking on status of his refills.

## 2010-08-22 NOTE — Telephone Encounter (Signed)
rx sent and patient aware.  

## 2010-08-22 NOTE — Telephone Encounter (Signed)
ok 

## 2010-12-12 ENCOUNTER — Telehealth: Payer: Self-pay | Admitting: Family Medicine

## 2010-12-12 NOTE — Telephone Encounter (Signed)
Pt requesting refill on sildenafil (VIAGRA) 50 MG tablet  °[facility] °

## 2010-12-12 NOTE — Telephone Encounter (Signed)
Pls advise.  

## 2010-12-12 NOTE — Telephone Encounter (Signed)
He has refills until March 2013

## 2010-12-18 ENCOUNTER — Ambulatory Visit (INDEPENDENT_AMBULATORY_CARE_PROVIDER_SITE_OTHER): Payer: PRIVATE HEALTH INSURANCE

## 2010-12-18 DIAGNOSIS — Z23 Encounter for immunization: Secondary | ICD-10-CM

## 2010-12-25 ENCOUNTER — Telehealth: Payer: Self-pay | Admitting: Family Medicine

## 2010-12-25 DIAGNOSIS — F528 Other sexual dysfunction not due to a substance or known physiological condition: Secondary | ICD-10-CM

## 2010-12-25 MED ORDER — SILDENAFIL CITRATE 50 MG PO TABS: 50.0000 mg | ORAL_TABLET | ORAL | Status: DC | PRN

## 2010-12-25 NOTE — Telephone Encounter (Signed)
Pt requesting refill on sildenafil (VIAGRA) 50 MG tablet  Please send to [facility]

## 2010-12-27 ENCOUNTER — Telehealth: Payer: Self-pay | Admitting: Family Medicine

## 2010-12-27 DIAGNOSIS — F528 Other sexual dysfunction not due to a substance or known physiological condition: Secondary | ICD-10-CM

## 2010-12-27 MED ORDER — SILDENAFIL CITRATE 50 MG PO TABS: 50.0000 mg | ORAL_TABLET | ORAL | Status: DC | PRN

## 2010-12-27 NOTE — Telephone Encounter (Signed)
Pt requesting rx for sildenafil (VIAGRA) 50 MG tablet  Be resubmitted to [facility]

## 2011-01-17 ENCOUNTER — Telehealth: Payer: Self-pay | Admitting: Family Medicine

## 2011-01-17 NOTE — Telephone Encounter (Signed)
Pt called and has an uri and has asthma. Cough, chest congestion, wheezing, general malaise.  Pt said that Dr Tawanna Cooler usually treats this with prednisone. Pt req work in ov late this afternoon, or any time tomorrow or Friday.

## 2011-01-17 NOTE — Telephone Encounter (Signed)
This patient can be scheduled depending on what Dr. Tawanna Cooler says or if he wants him to see one of the other partners.  Up to Dr. Tawanna Cooler and Fleet Contras.

## 2011-01-18 NOTE — Telephone Encounter (Signed)
Spoke with patient and appointment made

## 2011-01-19 ENCOUNTER — Encounter: Payer: Self-pay | Admitting: Family Medicine

## 2011-01-19 ENCOUNTER — Ambulatory Visit (INDEPENDENT_AMBULATORY_CARE_PROVIDER_SITE_OTHER): Payer: PRIVATE HEALTH INSURANCE | Admitting: Family Medicine

## 2011-01-19 VITALS — BP 134/90 | HR 74 | Temp 98.4°F | Wt 194.0 lb

## 2011-01-19 DIAGNOSIS — J45901 Unspecified asthma with (acute) exacerbation: Secondary | ICD-10-CM

## 2011-01-19 MED ORDER — PREDNISONE (PAK) 10 MG PO TABS: ORAL_TABLET | ORAL | Status: DC

## 2011-01-19 NOTE — Progress Notes (Signed)
  Subjective:    Patient ID: Luke [name], male    DOB: 1945-11-06, 65 y.o.   MRN: [id number]  HPI Here for 2 weeks of chest tightness and a dry cough. No fever. Some wheezing. He does not use his inhaler very much.    Review of Systems  Constitutional: Negative.   Respiratory: Positive for cough, chest tightness and wheezing. Negative for choking and shortness of breath.        Objective:   Physical Exam  Constitutional: He appears well-developed and well-nourished.  Neck: No thyromegaly present.  Cardiovascular: Normal rate, regular rhythm, normal heart sounds and intact distal pulses.   Pulmonary/Chest: Effort normal and breath sounds normal.  Lymphadenopathy:    He has no cervical adenopathy.          Assessment & Plan:  Use the inhaler as needed. Try a steroid dose pack. Mucinex prn

## 2011-02-13 ENCOUNTER — Encounter: Payer: Self-pay | Admitting: Family Medicine

## 2011-02-13 ENCOUNTER — Ambulatory Visit (INDEPENDENT_AMBULATORY_CARE_PROVIDER_SITE_OTHER): Payer: PRIVATE HEALTH INSURANCE | Admitting: Family Medicine

## 2011-02-13 ENCOUNTER — Telehealth: Payer: Self-pay | Admitting: Family Medicine

## 2011-02-13 VITALS — BP 150/90 | Temp 99.0°F | Wt 200.0 lb

## 2011-02-13 DIAGNOSIS — J069 Acute upper respiratory infection, unspecified: Secondary | ICD-10-CM

## 2011-02-13 MED ORDER — PREDNISONE 20 MG PO TABS: ORAL_TABLET | ORAL | Status: DC

## 2011-02-13 MED ORDER — HYDROCODONE-HOMATROPINE 5-1.5 MG/5ML PO SYRP: 2.5000 mL | ORAL_SOLUTION | Freq: Four times a day (QID) | ORAL | Status: AC | PRN

## 2011-02-13 NOTE — Patient Instructions (Signed)
Drink lots of liquids.  Tylenol p.r.n. For fever, and muscle aches.  Hydromet one half to 1 teaspoon 3 times daily p.r.n. Cough.  Always take a bottle of Afrin nasal spray to use to prevent head congestion from flying

## 2011-02-13 NOTE — Telephone Encounter (Signed)
Ok to work in. Appt made.

## 2011-02-13 NOTE — Telephone Encounter (Signed)
Wants to see Dr Tawanna Cooler today with a low grade fever and body aches. Has to go out of town in the morning. Please advise. Thanks.

## 2011-02-13 NOTE — Progress Notes (Signed)
  Subjective:    Patient ID: Luke [name], male    DOB: [birth year], 65 y.o.   MRN: [id number]  HPIL. Is a 65 year old, married male, nonsmoker, with an underlying history of asthma, who comes in today with a one-day history of fever, chills, and aching all over.  No sore throat.  No cough.  He came in today because he is going to [employer].  He was here couple weeks ago with a flare of his asthma and was given a short course of prednisone, which relieved his symptoms    Review of Systems General and pulmonary is systems otherwise negative    Objective:   Physical Exam  Well-developed well-nourished, male in no acute distress.  Examination of the HEENT were negative.  Neck was supple.  No adenopathy.  Lungs are clear      Assessment & Plan:  Viral syndrome.  Plan treat symptomatically, liquids, Tylenol Hydromet

## 2011-02-28 ENCOUNTER — Other Ambulatory Visit: Payer: Self-pay | Admitting: Family Medicine

## 2011-05-08 ENCOUNTER — Other Ambulatory Visit (INDEPENDENT_AMBULATORY_CARE_PROVIDER_SITE_OTHER): Payer: Medicare Other

## 2011-05-08 DIAGNOSIS — Z125 Encounter for screening for malignant neoplasm of prostate: Secondary | ICD-10-CM

## 2011-05-08 DIAGNOSIS — Z0000 Encounter for general adult medical examination without abnormal findings: Secondary | ICD-10-CM

## 2011-05-08 DIAGNOSIS — E785 Hyperlipidemia, unspecified: Secondary | ICD-10-CM | POA: Diagnosis not present

## 2011-05-08 LAB — POCT URINALYSIS DIPSTICK
Bilirubin, UA: NEGATIVE
Ketones, UA: NEGATIVE
Leukocytes, UA: NEGATIVE
Nitrite, UA: NEGATIVE
Protein, UA: NEGATIVE

## 2011-05-08 LAB — BASIC METABOLIC PANEL
CO2: 25 mEq/L (ref 19–32)
Calcium: 8.8 mg/dL (ref 8.4–10.5)
Potassium: 4.1 mEq/L (ref 3.5–5.1)
Sodium: 135 mEq/L (ref 135–145)

## 2011-05-08 LAB — HEPATIC FUNCTION PANEL
ALT: 18 U/L (ref 0–53)
AST: 15 U/L (ref 0–37)
Albumin: 3.7 g/dL (ref 3.5–5.2)

## 2011-05-08 LAB — TSH: TSH: 0.39 u[IU]/mL (ref 0.35–5.50)

## 2011-05-08 LAB — PSA: PSA: 1.97 ng/mL (ref 0.10–4.00)

## 2011-05-09 LAB — CBC WITH DIFFERENTIAL/PLATELET
Basophils Relative: 0.2 % (ref 0.0–3.0)
Eosinophils Relative: 0 % (ref 0.0–5.0)
HCT: 39 % (ref 39.0–52.0)
Hemoglobin: 13 g/dL (ref 13.0–17.0)
Lymphs Abs: 1.4 10*3/uL (ref 0.7–4.0)
Monocytes Relative: 3.5 % (ref 3.0–12.0)
Neutro Abs: 11.6 10*3/uL — ABNORMAL HIGH (ref 1.4–7.7)
RDW: 14.1 % (ref 11.5–14.6)
WBC: 13.5 10*3/uL — ABNORMAL HIGH (ref 4.5–10.5)

## 2011-05-22 ENCOUNTER — Encounter: Payer: PRIVATE HEALTH INSURANCE | Admitting: Family Medicine

## 2011-05-28 ENCOUNTER — Encounter: Payer: PRIVATE HEALTH INSURANCE | Admitting: Family Medicine

## 2011-06-13 ENCOUNTER — Ambulatory Visit (INDEPENDENT_AMBULATORY_CARE_PROVIDER_SITE_OTHER): Payer: Medicare Other | Admitting: Family Medicine

## 2011-06-13 ENCOUNTER — Encounter: Payer: Self-pay | Admitting: Family Medicine

## 2011-06-13 VITALS — BP 140/90 | Temp 98.6°F | Ht 66.5 in | Wt 199.0 lb

## 2011-06-13 DIAGNOSIS — F528 Other sexual dysfunction not due to a substance or known physiological condition: Secondary | ICD-10-CM | POA: Diagnosis not present

## 2011-06-13 DIAGNOSIS — F329 Major depressive disorder, single episode, unspecified: Secondary | ICD-10-CM

## 2011-06-13 DIAGNOSIS — Z23 Encounter for immunization: Secondary | ICD-10-CM | POA: Diagnosis not present

## 2011-06-13 DIAGNOSIS — I10 Essential (primary) hypertension: Secondary | ICD-10-CM

## 2011-06-13 DIAGNOSIS — Z0000 Encounter for general adult medical examination without abnormal findings: Secondary | ICD-10-CM

## 2011-06-13 DIAGNOSIS — D729 Disorder of white blood cells, unspecified: Secondary | ICD-10-CM

## 2011-06-13 DIAGNOSIS — E785 Hyperlipidemia, unspecified: Secondary | ICD-10-CM | POA: Diagnosis not present

## 2011-06-13 DIAGNOSIS — B009 Herpesviral infection, unspecified: Secondary | ICD-10-CM

## 2011-06-13 MED ORDER — ACYCLOVIR 200 MG PO CAPS: 200.0000 mg | ORAL_CAPSULE | Freq: Every day | ORAL | Status: DC

## 2011-06-13 MED ORDER — SILDENAFIL CITRATE 50 MG PO TABS: 50.0000 mg | ORAL_TABLET | ORAL | Status: DC | PRN

## 2011-06-13 MED ORDER — AMLODIPINE BESYLATE 5 MG PO TABS: 5.0000 mg | ORAL_TABLET | Freq: Every day | ORAL | Status: DC

## 2011-06-13 MED ORDER — LOSARTAN POTASSIUM 100 MG PO TABS: 100.0000 mg | ORAL_TABLET | Freq: Every day | ORAL | Status: DC

## 2011-06-13 MED ORDER — CITALOPRAM HYDROBROMIDE 20 MG PO TABS: 10.0000 mg | ORAL_TABLET | Freq: Every day | ORAL | Status: DC

## 2011-06-13 MED ORDER — SIMVASTATIN 20 MG PO TABS
20.0000 mg | ORAL_TABLET | Freq: Every day | ORAL | Status: DC
Start: 2011-06-13 — End: 2011-10-21

## 2011-06-13 NOTE — Patient Instructions (Signed)
Continue your current medications  Followup in 1 year sooner if any problems  Strongly consider the shingles vaccine 

## 2011-06-13 NOTE — Progress Notes (Signed)
  Subjective:    Patient ID: Luke [name], male    DOB: 09-12-1945, 66 y.o.   MRN: [id number]  HPIlayton is a 66 year old married male nonsmoker who comes in today for general Medicare wellness examination  He has a history of recurrent HSV 1 and takes acyclovir 200 mg daily  He has a history of hypertension for which he takes Norvasc 5 mg daily and losartan 100 mg daily BP at home 135/85  He takes Celexa 20 mg each bedtime for mild depression  He takes Zocor 20 mg daily along with an aspirin tablet for hyperlipidemia  He uses Viagra 50 mg when necessary for EGD  He gets routine eye care, hearing normal, regular dental care, colonoscopy and GI, seasonal flu shot 2012, tetanus 2005, information given on shingles and Pneumovax  Cognitive function normal he walks on a regular basis home health safety reviewed no issues identified, no guns in the house, he does have a health care power of attorney and living will    Review of Systems  Constitutional: Negative.   HENT: Negative.   Eyes: Negative.   Respiratory: Negative.   Cardiovascular: Negative.   Gastrointestinal: Negative.   Genitourinary: Negative.   Musculoskeletal: Negative.   Skin: Negative.   Neurological: Negative.   Hematological: Negative.   Psychiatric/Behavioral: Negative.        Objective:   Physical Exam  Constitutional: He is oriented to person, place, and time. He appears well-developed and well-nourished.  HENT:  Head: Normocephalic and atraumatic.  Right Ear: External ear normal.  Left Ear: External ear normal.  Nose: Nose normal.  Mouth/Throat: Oropharynx is clear and moist.  Eyes: Conjunctivae and EOM are normal. Pupils are equal, round, and reactive to light.  Neck: Normal range of motion. Neck supple. No JVD present. No tracheal deviation present. No thyromegaly present.  Cardiovascular: Normal rate, regular rhythm, normal heart sounds and intact distal pulses.  Exam reveals no gallop and no  friction rub.   No murmur heard. Pulmonary/Chest: Effort normal and breath sounds normal. No stridor. No respiratory distress. He has no wheezes. He has no rales. He exhibits no tenderness.  Abdominal: Soft. Bowel sounds are normal. He exhibits no distension and no mass. There is no tenderness. There is no rebound and no guarding.  Genitourinary: Rectum normal, prostate normal and penis normal. Guaiac negative stool. No penile tenderness.  Musculoskeletal: Normal range of motion. He exhibits no edema and no tenderness.  Lymphadenopathy:    He has no cervical adenopathy.  Neurological: He is alert and oriented to person, place, and time. He has normal reflexes. No cranial nerve deficit. He exhibits normal muscle tone.  Skin: Skin is warm and dry. No rash noted. No erythema. No pallor.  Psychiatric: He has a normal mood and affect. His behavior is normal. Judgment and thought content normal.          Assessment & Plan:  Healthy male  Hypertension continue current therapy  History of HSV 1 continue acyclovir 1 daily  Mild depression continue Celexa 20 mg at bedtime  Hyperlipidemia continue Zocor 20 and an aspirin tablet daily  History of ed,,,,,,,,,, continue Viagra 50 mg when necessary

## 2011-06-27 ENCOUNTER — Other Ambulatory Visit: Payer: Medicare Other

## 2011-06-28 ENCOUNTER — Other Ambulatory Visit: Payer: Medicare Other

## 2011-07-03 ENCOUNTER — Other Ambulatory Visit (INDEPENDENT_AMBULATORY_CARE_PROVIDER_SITE_OTHER): Payer: Medicare Other

## 2011-07-03 DIAGNOSIS — D729 Disorder of white blood cells, unspecified: Secondary | ICD-10-CM | POA: Diagnosis not present

## 2011-07-03 DIAGNOSIS — R7309 Other abnormal glucose: Secondary | ICD-10-CM

## 2011-07-03 LAB — CBC WITH DIFFERENTIAL/PLATELET
Basophils Relative: 0.6 % (ref 0.0–3.0)
Eosinophils Relative: 10 % — ABNORMAL HIGH (ref 0.0–5.0)
Lymphocytes Relative: 26.4 % (ref 12.0–46.0)
Monocytes Absolute: 0.4 10*3/uL (ref 0.1–1.0)
Monocytes Relative: 5.3 % (ref 3.0–12.0)
Neutrophils Relative %: 57.7 % (ref 43.0–77.0)
Platelets: 246 10*3/uL (ref 150.0–400.0)
RBC: 4.86 Mil/uL (ref 4.22–5.81)
WBC: 8.2 10*3/uL (ref 4.5–10.5)

## 2011-07-03 LAB — BASIC METABOLIC PANEL
BUN: 21 mg/dL (ref 6–23)
CO2: 24 mEq/L (ref 19–32)
Chloride: 107 mEq/L (ref 96–112)
GFR: 85.53 mL/min (ref >60.00)
Glucose, Bld: 104 mg/dL — ABNORMAL HIGH (ref 70–99)
Potassium: 4 mEq/L (ref 3.5–5.1)
Sodium: 141 mEq/L (ref 135–145)

## 2011-07-10 ENCOUNTER — Ambulatory Visit (INDEPENDENT_AMBULATORY_CARE_PROVIDER_SITE_OTHER): Payer: Medicare Other | Admitting: Family Medicine

## 2011-07-10 ENCOUNTER — Encounter: Payer: Self-pay | Admitting: Family Medicine

## 2011-07-10 VITALS — BP 120/80 | Temp 98.3°F | Wt 201.0 lb

## 2011-07-10 DIAGNOSIS — J45909 Unspecified asthma, uncomplicated: Secondary | ICD-10-CM | POA: Diagnosis not present

## 2011-07-10 MED ORDER — PREDNISONE 20 MG PO TABS: ORAL_TABLET | ORAL | Status: DC

## 2011-07-10 NOTE — Progress Notes (Signed)
  Subjective:    Patient ID: Luke [name], male    DOB: 1946-01-07, 66 y.o.   MRN: [id number]  HPI Luke [name] is a 66 year old married male nonsmoker who comes in today because of asthma  He has allergic rhinitis for which he uses steroid nasal spray and over-the-counter Claritin or Zyrtec. In the last week he began wheezing. He's using albuterol 2 puffs 3 times a day it helps but only temporarily.   Review of Systems General and pulmonary review of systems otherwise negative    Objective:   Physical Exam  Well-developed well-nourished male in no acute distress HEENT negative neck was supple no adenopathy lungs shows a respiratory rate of 12 and unlabored,,,,,,,,,,,,,, he has symmetrical breath sounds mild late expiratory wheezing      Assessment & Plan:  Allergic asthma plan prednisone burst and taper return when necessary

## 2011-07-10 NOTE — Patient Instructions (Signed)
Take the prednisone as directed  You might need to take the albuterol inhaler 2 puffs 3 times a day for a day or 2 until the prednisone takes affect  Return. When necessary

## 2011-09-14 ENCOUNTER — Other Ambulatory Visit: Payer: Self-pay | Admitting: Family Medicine

## 2011-09-18 ENCOUNTER — Telehealth: Payer: Self-pay | Admitting: Family Medicine

## 2011-09-18 DIAGNOSIS — J45909 Unspecified asthma, uncomplicated: Secondary | ICD-10-CM

## 2011-09-18 MED ORDER — PREDNISONE 20 MG PO TABS: ORAL_TABLET | ORAL | Status: DC

## 2011-09-18 NOTE — Telephone Encounter (Signed)
Prednisone 20 mg dispensed 30 tabs directions 2 tabs x3 days, 1 tab x3 days, a half a tab x3 days, then a half a tablet Monday Wednesday Friday for a 2 week taper

## 2011-09-18 NOTE — Telephone Encounter (Signed)
Rx sent to pharmacy   

## 2011-09-18 NOTE — Telephone Encounter (Signed)
Patient's spouse called stating that he has a bronchial infection again and would like to have prednisone called into a pharmacy that they are vacationing near. [place] ph. [phone]. Please advise.

## 2011-10-21 ENCOUNTER — Other Ambulatory Visit: Payer: Self-pay | Admitting: Family Medicine

## 2012-01-04 ENCOUNTER — Ambulatory Visit (INDEPENDENT_AMBULATORY_CARE_PROVIDER_SITE_OTHER): Payer: Medicare Other | Admitting: *Deleted

## 2012-01-04 DIAGNOSIS — Z23 Encounter for immunization: Secondary | ICD-10-CM

## 2012-01-17 ENCOUNTER — Other Ambulatory Visit: Payer: Self-pay | Admitting: Family Medicine

## 2012-01-18 ENCOUNTER — Telehealth: Payer: Self-pay | Admitting: Family Medicine

## 2012-01-18 DIAGNOSIS — F528 Other sexual dysfunction not due to a substance or known physiological condition: Secondary | ICD-10-CM

## 2012-01-18 MED ORDER — SILDENAFIL CITRATE 50 MG PO TABS: 50.0000 mg | ORAL_TABLET | ORAL | Status: DC | PRN

## 2012-01-18 NOTE — Telephone Encounter (Signed)
Pt called req refills of sildenafil (VIAGRA) 50 MG tablet to [facility]. Pls call pt when done.

## 2012-04-30 ENCOUNTER — Other Ambulatory Visit: Payer: Self-pay | Admitting: *Deleted

## 2012-04-30 MED ORDER — CITALOPRAM HYDROBROMIDE 20 MG PO TABS: ORAL_TABLET | ORAL | Status: DC

## 2012-04-30 MED ORDER — SIMVASTATIN 20 MG PO TABS: ORAL_TABLET | ORAL | Status: DC

## 2012-05-13 ENCOUNTER — Telehealth: Payer: Self-pay | Admitting: Family Medicine

## 2012-05-13 DIAGNOSIS — F329 Major depressive disorder, single episode, unspecified: Secondary | ICD-10-CM

## 2012-05-13 DIAGNOSIS — F528 Other sexual dysfunction not due to a substance or known physiological condition: Secondary | ICD-10-CM

## 2012-05-13 DIAGNOSIS — R5383 Other fatigue: Secondary | ICD-10-CM

## 2012-05-13 DIAGNOSIS — E785 Hyperlipidemia, unspecified: Secondary | ICD-10-CM

## 2012-05-13 DIAGNOSIS — D649 Anemia, unspecified: Secondary | ICD-10-CM

## 2012-05-13 DIAGNOSIS — N400 Enlarged prostate without lower urinary tract symptoms: Secondary | ICD-10-CM

## 2012-05-13 DIAGNOSIS — I10 Essential (primary) hypertension: Secondary | ICD-10-CM

## 2012-05-13 NOTE — Telephone Encounter (Signed)
Pt is on the bump list and has medicare which is normally an all in one appt. Pt does not want to have labs the same day as the cpx and is requesting you contact him.

## 2012-06-25 ENCOUNTER — Encounter: Payer: Medicare Other | Admitting: Family Medicine

## 2012-07-08 ENCOUNTER — Other Ambulatory Visit: Payer: Self-pay | Admitting: Family Medicine

## 2012-07-16 ENCOUNTER — Other Ambulatory Visit (INDEPENDENT_AMBULATORY_CARE_PROVIDER_SITE_OTHER): Payer: Medicare Other

## 2012-07-16 DIAGNOSIS — R5381 Other malaise: Secondary | ICD-10-CM | POA: Diagnosis not present

## 2012-07-16 DIAGNOSIS — D649 Anemia, unspecified: Secondary | ICD-10-CM

## 2012-07-16 DIAGNOSIS — I10 Essential (primary) hypertension: Secondary | ICD-10-CM | POA: Diagnosis not present

## 2012-07-16 DIAGNOSIS — R5383 Other fatigue: Secondary | ICD-10-CM | POA: Diagnosis not present

## 2012-07-16 DIAGNOSIS — N400 Enlarged prostate without lower urinary tract symptoms: Secondary | ICD-10-CM | POA: Diagnosis not present

## 2012-07-16 DIAGNOSIS — E785 Hyperlipidemia, unspecified: Secondary | ICD-10-CM

## 2012-07-16 LAB — CBC WITH DIFFERENTIAL/PLATELET
Basophils Relative: 0.6 % (ref 0.0–3.0)
Eosinophils Relative: 7.3 % — ABNORMAL HIGH (ref 0.0–5.0)
HCT: 40.9 % (ref 39.0–52.0)
Hemoglobin: 14.1 g/dL (ref 13.0–17.0)
Lymphs Abs: 1.8 10*3/uL (ref 0.7–4.0)
Monocytes Relative: 9.2 % (ref 3.0–12.0)
Neutro Abs: 4.2 10*3/uL (ref 1.4–7.7)
WBC: 7.3 10*3/uL (ref 4.5–10.5)

## 2012-07-16 LAB — BASIC METABOLIC PANEL
CO2: 27 mEq/L (ref 19–32)
Calcium: 8.7 mg/dL (ref 8.4–10.5)
Glucose, Bld: 100 mg/dL — ABNORMAL HIGH (ref 70–99)
Sodium: 140 mEq/L (ref 135–145)

## 2012-07-16 LAB — POCT URINALYSIS DIPSTICK
Bilirubin, UA: NEGATIVE
Blood, UA: NEGATIVE
Glucose, UA: NEGATIVE
Spec Grav, UA: >=1.03

## 2012-07-16 LAB — LIPID PANEL
HDL: 27.8 mg/dL — ABNORMAL LOW (ref >39.00)
Total CHOL/HDL Ratio: 5
VLDL: 34.6 mg/dL (ref 0.0–40.0)

## 2012-07-16 LAB — HEPATIC FUNCTION PANEL
AST: 19 U/L (ref 0–37)
Total Bilirubin: 0.6 mg/dL (ref 0.3–1.2)

## 2012-07-16 LAB — TSH: TSH: 2.04 u[IU]/mL (ref 0.35–5.50)

## 2012-07-29 ENCOUNTER — Encounter: Payer: Medicare Other | Admitting: Family Medicine

## 2012-09-11 ENCOUNTER — Ambulatory Visit (INDEPENDENT_AMBULATORY_CARE_PROVIDER_SITE_OTHER): Payer: Medicare Other | Admitting: Family Medicine

## 2012-09-11 ENCOUNTER — Encounter: Payer: Self-pay | Admitting: Family Medicine

## 2012-09-11 VITALS — BP 110/80 | Temp 98.5°F | Ht 65.25 in | Wt 201.0 lb

## 2012-09-11 DIAGNOSIS — J45991 Cough variant asthma: Secondary | ICD-10-CM

## 2012-09-11 DIAGNOSIS — F329 Major depressive disorder, single episode, unspecified: Secondary | ICD-10-CM

## 2012-09-11 DIAGNOSIS — E785 Hyperlipidemia, unspecified: Secondary | ICD-10-CM

## 2012-09-11 DIAGNOSIS — F528 Other sexual dysfunction not due to a substance or known physiological condition: Secondary | ICD-10-CM

## 2012-09-11 DIAGNOSIS — I10 Essential (primary) hypertension: Secondary | ICD-10-CM

## 2012-09-11 DIAGNOSIS — J309 Allergic rhinitis, unspecified: Secondary | ICD-10-CM

## 2012-09-11 DIAGNOSIS — B009 Herpesviral infection, unspecified: Secondary | ICD-10-CM

## 2012-09-11 MED ORDER — CITALOPRAM HYDROBROMIDE 20 MG PO TABS: ORAL_TABLET | ORAL | Status: DC

## 2012-09-11 MED ORDER — LOSARTAN POTASSIUM 100 MG PO TABS: ORAL_TABLET | ORAL | Status: DC

## 2012-09-11 MED ORDER — ACYCLOVIR 200 MG PO CAPS: ORAL_CAPSULE | ORAL | Status: DC

## 2012-09-11 MED ORDER — SILDENAFIL CITRATE 50 MG PO TABS: 50.0000 mg | ORAL_TABLET | ORAL | Status: DC | PRN

## 2012-09-11 MED ORDER — ALBUTEROL SULFATE HFA 108 (90 BASE) MCG/ACT IN AERS: 2.0000 | INHALATION_SPRAY | Freq: Four times a day (QID) | RESPIRATORY_TRACT | Status: DC | PRN

## 2012-09-11 MED ORDER — AMLODIPINE BESYLATE 5 MG PO TABS: ORAL_TABLET | ORAL | Status: DC

## 2012-09-11 MED ORDER — SIMVASTATIN 20 MG PO TABS: ORAL_TABLET | ORAL | Status: DC

## 2012-09-11 NOTE — Patient Instructions (Signed)
Viagra 50 mg dispensed 10,,,,,,,,,,,,,, [email or web address]  I e-mailed all in the rest of your medications  Celexa 20 mg,,,,,,,,,,, one half tab daily for 2 weeks then one half tab Monday Wednesday Friday for 2 weeks then stop  Return in one year for general wellness exam sooner if any problems  Call your insurance company and find out where you can get the best Price on your shingles vaccine

## 2012-09-11 NOTE — Progress Notes (Signed)
  Subjective:    Patient ID: Luke [name], male    DOB: 1945-11-19, 67 y.o.   MRN: [id number]  HPI Luke [name] is in 67 year old married male nonsmoker who comes in today for a Medicare wellness exam because of a history of recurrent HSV 1, occasional cough and asthma, mild hypertension, mild depression, erectile dysfunction, hyperlipidemia  His medicines reviewed the been no changes. He's now taking over-the-counter Prilosec daily because of chronic reflux.  He gets routine eye care, dental care, colonoscopy and GI, vaccinations up-to-date  He stood shingles vaccine  Cognitive function normal he walks on a regular basis and he and his wife dance, home health safety reviewed no issues identified, no guns in the house, he does have a health care power of attorney and living well   Review of Systems  Constitutional: Negative.   HENT: Negative.   Eyes: Negative.   Respiratory: Negative.   Cardiovascular: Negative.   Gastrointestinal: Negative.   Genitourinary: Negative.   Musculoskeletal: Negative.   Skin: Negative.   Neurological: Negative.   Psychiatric/Behavioral: Negative.        Objective:   Physical Exam  Nursing note and vitals reviewed. Constitutional: He is oriented to person, place, and time. He appears well-developed and well-nourished.  HENT:  Head: Normocephalic and atraumatic.  Right Ear: External ear normal.  Left Ear: External ear normal.  Nose: Nose normal.  Mouth/Throat: Oropharynx is clear and moist.  Eyes: Conjunctivae and EOM are normal. Pupils are equal, round, and reactive to light.  Neck: Normal range of motion. Neck supple. No JVD present. No tracheal deviation present. No thyromegaly present.  Cardiovascular: Regular rhythm, normal heart sounds and intact distal pulses.  Exam reveals no gallop and no friction rub.   No murmur heard. No carotid or aortic bruits peripheral pulses 2+ and symmetricalOccasional skip previous history of occasional  asymptomatic PVCs  Pulmonary/Chest: Effort normal and breath sounds normal. No stridor. No respiratory distress. He has no wheezes. He has no rales. He exhibits no tenderness.  Abdominal: Soft. Bowel sounds are normal. He exhibits no distension and no mass. There is no tenderness. There is no rebound and no guarding.  Genitourinary: Rectum normal, prostate normal and penis normal. Guaiac negative stool. No penile tenderness.  Musculoskeletal: Normal range of motion. He exhibits no edema and no tenderness.  Lymphadenopathy:    He has no cervical adenopathy.  Neurological: He is alert and oriented to person, place, and time. He has normal reflexes. No cranial nerve deficit. He exhibits normal muscle tone.  Skin: Skin is warm and dry. No rash noted. No erythema. No pallor.  Total body skin exam normal  Psychiatric: He has a normal mood and affect. His behavior is normal. Judgment and thought content normal.          Assessment & Plan:  Healthy male  History of recurrent HSV 1 continue acyclovir 200 mg daily  History of occasional cough and asthma albuterol when necessary  Hypertension continue Norvasc and Cozaar  History of mild depression taper off medication  Erectile dysfunction continue Viagra when necessary  History of hyperlipidemia continue Zocor 20 mg daily and an aspirin tablet  Reflux esophagitis continue Prilosec 20 mg daily

## 2012-12-17 ENCOUNTER — Ambulatory Visit (INDEPENDENT_AMBULATORY_CARE_PROVIDER_SITE_OTHER): Payer: Medicare Other

## 2012-12-17 DIAGNOSIS — Z23 Encounter for immunization: Secondary | ICD-10-CM

## 2013-01-16 ENCOUNTER — Ambulatory Visit (INDEPENDENT_AMBULATORY_CARE_PROVIDER_SITE_OTHER): Payer: Medicare Other | Admitting: *Deleted

## 2013-01-16 DIAGNOSIS — Z23 Encounter for immunization: Secondary | ICD-10-CM | POA: Diagnosis not present

## 2013-01-16 DIAGNOSIS — Z2911 Encounter for prophylactic immunotherapy for respiratory syncytial virus (RSV): Secondary | ICD-10-CM | POA: Diagnosis not present

## 2013-02-06 ENCOUNTER — Ambulatory Visit (INDEPENDENT_AMBULATORY_CARE_PROVIDER_SITE_OTHER): Payer: Medicare Other | Admitting: Family Medicine

## 2013-02-06 ENCOUNTER — Encounter: Payer: Self-pay | Admitting: Family Medicine

## 2013-02-06 VITALS — BP 130/80 | HR 76 | Temp 98.7°F | Wt 205.0 lb

## 2013-02-06 DIAGNOSIS — J209 Acute bronchitis, unspecified: Secondary | ICD-10-CM

## 2013-02-06 MED ORDER — METHYLPREDNISOLONE ACETATE 80 MG/ML IJ SUSP
120.0000 mg | Freq: Once | INTRAMUSCULAR | Status: AC
  Administered 2013-02-06: 120 mg via INTRAMUSCULAR

## 2013-02-06 MED ORDER — AZITHROMYCIN 250 MG PO TABS: ORAL_TABLET | ORAL | Status: DC

## 2013-02-06 NOTE — Progress Notes (Signed)
   Subjective:    Patient ID: Luke [name], male    DOB: 11/25/1945, 67 y.o.   MRN: [id number]  HPI Here for 5 days of chest tightness, coughing up green sputum, and PND. No fever. Using his inhaler.    Review of Systems  Constitutional: Negative.   HENT: Positive for congestion and postnasal drip.   Eyes: Negative.   Respiratory: Positive for cough, chest tightness and shortness of breath.        Objective:   Physical Exam  Constitutional: He appears well-developed and well-nourished. No distress.  HENT:  Right Ear: External ear normal.  Left Ear: External ear normal.  Nose: Nose normal.  Mouth/Throat: Oropharynx is clear and moist.  Eyes: Conjunctivae are normal.  Pulmonary/Chest: Effort normal. He has no rales.  Soft wheezes   Lymphadenopathy:    He has no cervical adenopathy.          Assessment & Plan:  Recheck prn

## 2013-02-06 NOTE — Addendum Note (Signed)
Addended by: Aniceto Boss A on: 02/06/2013 04:39 PM   Modules accepted: Orders

## 2013-02-06 NOTE — Progress Notes (Signed)
Pre visit review using our clinic review tool, if applicable. No additional management support is needed unless otherwise documented below in the visit note. 

## 2013-02-18 ENCOUNTER — Telehealth: Payer: Self-pay | Admitting: Family Medicine

## 2013-02-18 NOTE — Telephone Encounter (Signed)
Appointment made

## 2013-02-18 NOTE — Telephone Encounter (Signed)
Pt saw dr Clent Ridges 12/5 for bronchitis. Pt is not much better. Pt finished all meds and had pred shot. Pt would like to see dr todd for this. Dr Clent Ridges out of office today No appts/ pls advise

## 2013-02-19 ENCOUNTER — Ambulatory Visit (INDEPENDENT_AMBULATORY_CARE_PROVIDER_SITE_OTHER): Payer: Medicare Other | Admitting: Family Medicine

## 2013-02-19 ENCOUNTER — Encounter: Payer: Self-pay | Admitting: Family Medicine

## 2013-02-19 VITALS — BP 130/90 | Temp 98.1°F | Wt 206.0 lb

## 2013-02-19 DIAGNOSIS — J45991 Cough variant asthma: Secondary | ICD-10-CM | POA: Diagnosis not present

## 2013-02-19 MED ORDER — PREDNISONE 20 MG PO TABS: ORAL_TABLET | ORAL | Status: DC

## 2013-02-19 NOTE — Progress Notes (Signed)
   Subjective:    Patient ID: Luke [name], male    DOB: [birth year], 67 y.o.   MRN: [id number]  HPI Luke [name] is a 67 year old married male nonsmoker who comes in today for evaluation of a cough for 3 weeks  He's had a cough for the past 3 weeks. It started with a cold his cold symptoms went away but the cough persisted and now he feels like he is wheezing. He's bringing up a little bit yellow sputum but not much. No fever   Review of Systems    review of systems negative Objective:   Physical Exam  Well-developed well-nourished male in no acute distress HEENT negative neck was supple no adenopathy lungs are clear except for symmetrical late expiratory wheezing bilaterally      Assessment & Plan:

## 2013-02-19 NOTE — Patient Instructions (Signed)
Prednisone 20 mg......... 2 tablets now then 2 tablets every morning until your cough subsides then taper as outlined  Albuterol 2 puffs 3 times daily for 3-5 days until your airways quiet down

## 2013-03-30 ENCOUNTER — Ambulatory Visit (INDEPENDENT_AMBULATORY_CARE_PROVIDER_SITE_OTHER): Payer: Medicare Other | Admitting: Family Medicine

## 2013-03-30 ENCOUNTER — Ambulatory Visit (INDEPENDENT_AMBULATORY_CARE_PROVIDER_SITE_OTHER)
Admission: RE | Admit: 2013-03-30 | Discharge: 2013-03-30 | Disposition: A | Discharge status: Home / Self Care | Payer: Medicare Other | Source: Ambulatory Visit | Attending: Family Medicine | Admitting: Family Medicine

## 2013-03-30 ENCOUNTER — Encounter: Payer: Self-pay | Admitting: Family Medicine

## 2013-03-30 VITALS — BP 148/88 | Temp 98.2°F | Wt 203.0 lb

## 2013-03-30 DIAGNOSIS — G2581 Restless legs syndrome: Secondary | ICD-10-CM

## 2013-03-30 DIAGNOSIS — J45991 Cough variant asthma: Secondary | ICD-10-CM | POA: Diagnosis not present

## 2013-03-30 DIAGNOSIS — R059 Cough, unspecified: Secondary | ICD-10-CM | POA: Diagnosis not present

## 2013-03-30 DIAGNOSIS — R05 Cough: Secondary | ICD-10-CM | POA: Diagnosis not present

## 2013-03-30 MED ORDER — PRAMIPEXOLE DIHYDROCHLORIDE 0.25 MG PO TABS: ORAL_TABLET | ORAL | Status: DC

## 2013-03-30 MED ORDER — FLUTICASONE-SALMETEROL 100-50 MCG/DOSE IN AEPB: INHALATION_SPRAY | RESPIRATORY_TRACT | Status: DC

## 2013-03-30 NOTE — Patient Instructions (Signed)
Advair,,,,, 2 puffs twice daily,,,,,,,,,, remember to rinse and spit with mouthwash after each use  Albuterol 2 puffs twice daily  Chest x-ray today  We will set up a pulmonary consult ASAP

## 2013-03-30 NOTE — Progress Notes (Signed)
   Subjective:    Patient ID: Luke [name], male    DOB: August 15, 1945, 68 y.o.   MRN: [id number]  HPI Luke [name] is a 68 year old married male nonsmoker who comes in today for evaluation of persistent chest congestion and cough for 2 months  We saw him 2 months ago for this problem. We felt like he had cough variant asthma and gave him a four-week tapering dose of prednisone. We started out with 40 mg daily for 3 days and then taper it  He went to one rather that didn't seem to help therefore we repeated his second round. That did also not helped. He takes albuterol 1 or 2 puffs 2-3 times daily. Has no fever chills sputum production hemoptysis weight loss etc.   Review of Systems    review of systems otherwise negative Objective:   Physical Exam  Well-developed and nourished male no acute distress vital signs stable he is afebrile BP slightly elevated 140/88  ENT negative cardiopulmonary negative except for some mild symmetrical late expiratory wheezing on forced expiration      Assessment & Plan:    Cough and asthma unresolved with 2 rounds of prednisone,,,,,,,,,,,, chest x-ray,,,,,, pulmonary consult,,,,,,,, begin inhaled steroid in the meantime

## 2013-03-30 NOTE — Progress Notes (Signed)
Pre visit review using our clinic review tool, if applicable. No additional management support is needed unless otherwise documented below in the visit note. 

## 2013-04-01 ENCOUNTER — Other Ambulatory Visit: Payer: Self-pay | Admitting: Family Medicine

## 2013-04-01 DIAGNOSIS — R9389 Abnormal findings on diagnostic imaging of other specified body structures: Secondary | ICD-10-CM

## 2013-04-01 DIAGNOSIS — J45991 Cough variant asthma: Secondary | ICD-10-CM

## 2013-04-07 ENCOUNTER — Institutional Professional Consult (permissible substitution): Payer: Medicare Other | Admitting: Internal Medicine

## 2013-04-15 ENCOUNTER — Encounter (INDEPENDENT_AMBULATORY_CARE_PROVIDER_SITE_OTHER): Payer: Self-pay

## 2013-04-15 ENCOUNTER — Encounter: Payer: Self-pay | Admitting: Internal Medicine

## 2013-04-15 ENCOUNTER — Ambulatory Visit (INDEPENDENT_AMBULATORY_CARE_PROVIDER_SITE_OTHER): Payer: Medicare Other | Admitting: Internal Medicine

## 2013-04-15 VITALS — BP 142/92 | HR 76 | Temp 98.6°F | Ht 67.0 in | Wt 204.8 lb

## 2013-04-15 DIAGNOSIS — J45991 Cough variant asthma: Secondary | ICD-10-CM | POA: Diagnosis not present

## 2013-04-15 DIAGNOSIS — R911 Solitary pulmonary nodule: Secondary | ICD-10-CM

## 2013-04-15 MED ORDER — AMOXICILLIN-POT CLAVULANATE 875-125 MG PO TABS: 1.0000 | ORAL_TABLET | Freq: Two times a day (BID) | ORAL | Status: DC

## 2013-04-15 MED ORDER — BUDESONIDE-FORMOTEROL FUMARATE 80-4.5 MCG/ACT IN AERO: INHALATION_SPRAY | RESPIRATORY_TRACT | Status: DC

## 2013-04-15 NOTE — Patient Instructions (Signed)
Stop advair  Start symbicort 80 Take 2 puffs first thing in am and then another 2 puffs about 12 hours later.    change prilosec Take 30-60 min before first meal of the day and Pepcid 20 ac 20 mg one at bedtime  Augmentin 875 mg take one pill twice daily  X 10 days - take at breakfast and supper with large glass of water.  It would help reduce the usual side effects (diarrhea and yeast infections) if you ate cultured yogurt at lunch.   GERD (REFLUX)  is an extremely common cause of respiratory symptoms, many times with no significant heartburn at all.    It can be treated with medication, but also with lifestyle changes including avoidance of late meals, excessive alcohol, smoking cessation, and avoid fatty foods, chocolate, peppermint, colas, red wine, and acidic juices such as orange juice.  NO MINT OR MENTHOL PRODUCTS SO NO COUGH DROPS  USE SUGARLESS CANDY INSTEAD (jolley ranchers or Stover's)  NO OIL BASED VITAMINS - use powdered substitutes.   Please schedule a follow up office visit in 2 weeks, sooner if needed

## 2013-04-15 NOTE — Progress Notes (Signed)
   Subjective:    Patient ID: Luke [name], male    DOB: 06/04/1945  MRN: [id number]  HPI  55 yowm never smoker onset of bad bronchitis as child 100% recovered then noted teenager sneezing and coughing in fall mostly then shifted to spring in 40's > fall typically responsive to pred referred 04/15/2013 to pulmonary clinic by Dr Luke [name] for refractory to pred coughing started Nov 2014  04/15/2013 1st [facility] office visit/ Luke [name] Chief Complaint  Patient presents with  . Pulmonary Consult    Referred per Dr. Stevie [name]. Pt c/o cough and SOB that comes and goes since Nov 2014. Cough is prod in the am and green sputum, but turns to clear throughout the day. Cough seems to be worse at night.   worse at hs assoc with sensation of wheeze and not coughing as much during the day  On advair prn proair last took it sev days prior to OV  Seemed to help prilosec qd not necessarily ac and has freq dyspepsia even on ppi  No obvious day to day or daytime variabilty or assoc   cp or chest tightness, subjective wheeze overt sinus  symptoms. No unusual exp hx or h/o childhood pna/ asthma or knowledge of premature birth.   . Also denies any obvious fluctuation of symptoms with weather or environmental changes or other aggravating or alleviating factors except as outlined above   Current Medications, Allergies, Complete Past Medical History, Past Surgical History, Family History, and Social History were reviewed in [employer] record.        Review of Systems  Constitutional: Negative for fever, chills, activity change, appetite change and unexpected weight change.  HENT: Positive for congestion. Negative for dental problem, postnasal drip, rhinorrhea, sneezing, sore throat, trouble swallowing and voice change.   Eyes: Negative for visual disturbance.  Respiratory: Positive for cough and shortness of breath. Negative for choking.   Cardiovascular: Negative for chest pain  and leg swelling.  Gastrointestinal: Negative for nausea, vomiting and abdominal pain.  Genitourinary: Negative for difficulty urinating.       Indigestion  Musculoskeletal: Negative for arthralgias.  Skin: Negative for rash.  Psychiatric/Behavioral: Negative for behavioral problems and confusion.       Objective:   Physical Exam  amb wm with cough on exp  Wt Readings from Last 3 Encounters:  04/15/13 204 lb 12.8 oz (92.897 kg)  03/30/13 203 lb (92.08 kg)  02/19/13 206 lb (93.441 kg)      HEENT: nl dentition, turbinates, and orophanx. Nl external ear canals without cough reflex   NECK :  without JVD/Nodes/TM/ nl carotid upstrokes bilaterally   LUNGS: no acc muscle use, clear to A and P bilaterally without cough on insp or exp maneuvers   CV:  RRR  no s3 or murmur or increase in P2, no edema   ABD:  soft and nontender with nl excursion in the supine position. No bruits or organomegaly, bowel sounds nl  MS:  warm without deformities, calf tenderness, cyanosis or clubbing  SKIN: warm and dry without lesions    NEURO:  alert, approp, no deficits    cxr 03/30/13  Question left nipple shadow.  10 mm ovoid nodular density right upper lobe, cannot exclude  pulmonary mass/ nodule;        Assessment & Plan:

## 2013-04-16 DIAGNOSIS — R911 Solitary pulmonary nodule: Secondary | ICD-10-CM | POA: Insufficient documentation

## 2013-04-16 HISTORY — DX: Solitary pulmonary nodule: R91.1

## 2013-04-16 NOTE — Assessment & Plan Note (Addendum)
The most common causes of chronic cough in immunocompetent adults include the following: upper airway cough syndrome (UACS), previously referred to as postnasal drip syndrome (PNDS), which is caused by variety of rhinosinus conditions; (2) asthma; (3) GERD; (4) chronic bronchitis from cigarette smoking or other inhaled environmental irritants; (5) nonasthmatic eosinophilic bronchitis; and (6) bronchiectasis.   These conditions, singly or in combination, have accounted for up to 94% of the causes of chronic cough in prospective studies.   Other conditions have constituted no >6% of the causes in prospective studies These have included bronchogenic carcinoma, chronic interstitial pneumonia, sarcoidosis, left ventricular failure, ACEI-induced cough, and aspiration from a condition associated with pharyngeal dysfunction.    Chronic cough is often simultaneously caused by more than one condition. A single cause has been found from 38 to 82% of the time, multiple causes from 18 to 62%. Multiply caused cough has been the result of three diseases up to 42% of the time.      Agree this is probably cough variant asthma ? Flare due to sinus dz or gerd or both  rec  Change advair to symbicort 80 2bid trial basis (less irritation with hfa than dpi in terms of cough potential) Rx sinus dz with augmentin x 10 d since has am green sputum and f/u with sinus Ct ? Acid (or non-acid) GERD also contributing > always difficult to exclude as up to 75% of pts in some series report no assoc GI/ Heartburn symptoms> rec max (24h)  acid suppression and diet restrictions/ reviewed and instructions given in writing.   The proper method of use, as well as anticipated side effects, of a metered-dose inhaler are discussed and demonstrated to the patient. Improved effectiveness after extensive coaching during this visit to a level of approximately  75%

## 2013-04-16 NOTE — Assessment & Plan Note (Signed)
03/30/13  CXR Question left nipple shadow.  10 mm ovoid nodular density right upper lobe, cannot exclude  pulmonary mass/ nodule > f/u planned by Dr Sherren Mocha

## 2013-04-28 ENCOUNTER — Other Ambulatory Visit: Payer: Medicare Other

## 2013-04-29 ENCOUNTER — Ambulatory Visit (INDEPENDENT_AMBULATORY_CARE_PROVIDER_SITE_OTHER)
Admission: RE | Admit: 2013-04-29 | Discharge: 2013-04-29 | Disposition: A | Discharge status: Home / Self Care | Payer: Medicare Other | Source: Ambulatory Visit | Attending: Internal Medicine | Admitting: Internal Medicine

## 2013-04-29 ENCOUNTER — Ambulatory Visit (INDEPENDENT_AMBULATORY_CARE_PROVIDER_SITE_OTHER): Payer: Medicare Other | Admitting: Internal Medicine

## 2013-04-29 ENCOUNTER — Encounter: Payer: Self-pay | Admitting: Internal Medicine

## 2013-04-29 VITALS — BP 140/84 | HR 70 | Temp 97.9°F | Ht 67.0 in | Wt 201.0 lb

## 2013-04-29 DIAGNOSIS — J9819 Other pulmonary collapse: Secondary | ICD-10-CM | POA: Diagnosis not present

## 2013-04-29 DIAGNOSIS — J45991 Cough variant asthma: Secondary | ICD-10-CM | POA: Diagnosis not present

## 2013-04-29 DIAGNOSIS — R911 Solitary pulmonary nodule: Secondary | ICD-10-CM

## 2013-04-29 MED ORDER — MONTELUKAST SODIUM 10 MG PO TABS: ORAL_TABLET | ORAL | Status: DC

## 2013-04-29 MED ORDER — BUDESONIDE-FORMOTEROL FUMARATE 160-4.5 MCG/ACT IN AERO: INHALATION_SPRAY | RESPIRATORY_TRACT | Status: DC

## 2013-04-29 NOTE — Progress Notes (Signed)
Subjective:    Patient ID: Luke [name], male    DOB: 12/15/1945  MRN: [id number]   Brief patient profile:  40 yowm never smoker onset of bad bronchitis as child 100% recovered then noted teenager sneezing and coughing in fall mostly then shifted to spring in 40's > fall typically responsive to pred referred 04/15/2013 to pulmonary clinic by Dr Christie Nottingham for refractory to pred coughing started Nov 2014   History of Present Illness  04/15/2013 1st [facility] office visit/ Gracianna Vink Chief Complaint  Patient presents with  . Pulmonary Consult    Referred per Dr. Stevie Kern. Pt c/o cough and SOB that comes and goes since Nov 2014. Cough is prod in the am and green sputum, but turns to clear throughout the day. Cough seems to be worse at night.   worse at hs assoc with sensation of wheeze and not coughing as much during the day  On advair prn proair last took it sev days prior to OV  Seemed to help prilosec qd not necessarily ac and has freq dyspepsia even on ppi rec Stop advair Start symbicort 80 Take 2 puffs first thing in am and then another 2 puffs about 12 hours later.  change prilosec Take 30-60 min before first meal of the day and Pepcid 20 ac 20 mg one at bedtime Augmentin 875 mg take one pill twice daily  X 10 days - take at breakfast and supper with large glass of water.  It would help reduce the usual side effects (diarrhea and yeast infections) if you ate cultured yogurt at lunch.  GERD   04/29/2013 f/u ov/Juston Goheen re: cough since Nov 2014  Chief Complaint  Patient presents with  . Follow-up    Pt states that his cough is approx 85% better. He stopped the symbiort due to having cramps and is taking advair diskus prn- about every other day.   main problem is sense of pnds at hs  Not limited by breathing from desired activities, not needing any saba at this point  No obvious day to day or daytime variabilty or assoc sob or cp or chest tightness, subjective wheeze overt  sinus or hb symptoms. No unusual exp hx or h/o childhood pna/ asthma or knowledge of premature birth.  Sleeping ok without nocturnal  or early am exacerbation  of respiratory  c/o's or need for noct saba. Also denies any obvious fluctuation of symptoms with weather or environmental changes or other aggravating or alleviating factors except as outlined above   Current Medications, Allergies, Complete Past Medical History, Past Surgical History, Family History, and Social History were reviewed in [employer] record.  ROS  The following are not active complaints unless bolded sore throat, dysphagia, dental problems, itching, sneezing,  nasal congestion or excess/ purulent secretions, ear ache,   fever, chills, sweats, unintended wt loss, pleuritic or exertional cp, hemoptysis,  orthopnea pnd or leg swelling, presyncope, palpitations, heartburn, abdominal pain, anorexia, nausea, vomiting, diarrhea  or change in bowel or urinary habits, change in stools or urine, dysuria,hematuria,  rash, arthralgias, visual complaints, headache, numbness weakness or ataxia or problems with walking or coordination,  change in mood/affect or memory.                   Objective:   Physical Exam  amb wm with cough on exp  04/29/2013       201  Wt Readings from Last 3 Encounters:  04/15/13 204 lb 12.8  oz (92.897 kg)  03/30/13 203 lb (92.08 kg)  02/19/13 206 lb (93.441 kg)      HEENT: nl dentition, turbinates, and orophanx. Nl external ear canals without cough reflex   NECK :  without JVD/Nodes/TM/ nl carotid upstrokes bilaterally   LUNGS: no acc muscle use, clear to A and P bilaterally with min exp wheeze bilaterally    CV:  RRR  no s3 or murmur or increase in P2, no edema   ABD:  soft and nontender with nl excursion in the supine position. No bruits or organomegaly, bowel sounds nl  MS:  warm without deformities, calf tenderness, cyanosis or clubbing         CXR   04/29/2013 :  Low lung volumes with vascular crowding and bibasilar atelectasis.         Assessment & Plan:

## 2013-04-29 NOTE — Patient Instructions (Addendum)
Add chlortrimeton 4 mg at bedtime to the pepcid to see what effect it has on the night time tickle  Restart symbicort 160 one every 12 hours  If not satisfied add singulair 10 mg one each pm   Please remember to go to the  x-ray department downstairs for your tests - we will call you with the results when they are available  Please schedule a follow up office visit in 4 weeks, sooner if needed

## 2013-04-29 NOTE — Progress Notes (Signed)
Quick Note:  lmtcb ______ 

## 2013-04-29 NOTE — Progress Notes (Signed)
Quick Note:  Spoke with pt and notified of results per Dr. Wert. Pt verbalized understanding and denied any questions.  ______ 

## 2013-05-01 NOTE — Assessment & Plan Note (Addendum)
DDX of  difficult airways managment all start with A and  include Adherence, Ace Inhibitors, Acid Reflux, Active Sinus Disease, Alpha 1 Antitripsin deficiency, Anxiety masquerading as Airways dz,  ABPA,  allergy(esp in young), Aspiration (esp in elderly), Adverse effects of DPI,  Active smokers, plus two Bs  = Bronchiectasis and Beta blocker use..and one C= CHF  Adherence is always the initial "prime suspect" and is a multilayered concern that requires a "trust but verify" approach in every patient - starting with knowing how to use medications, especially inhalers, correctly, keeping up with refills and understanding the fundamental difference between maintenance and prns vs those medications only taken for a very short course and then stopped and not refilled.  The proper method of use, as well as anticipated side effects, of a metered-dose inhaler are discussed and demonstrated to the patient. Improved effectiveness after extensive coaching during this visit to a level of approximately  75% so rechallenge with symbicort 160 one bid to see if cramping less of an issue   ? Adverse effect of dpi, esp advair > if advair better for him than symbicort due to cramps then rec use the hfa version  ? Allergy > if not effective add singulair 10 mg at bedtime  ? Active sinus dz/ rhinitis > add H1 at hs to see if eliminates tickle at hs

## 2013-05-27 ENCOUNTER — Ambulatory Visit (INDEPENDENT_AMBULATORY_CARE_PROVIDER_SITE_OTHER): Payer: Medicare Other | Admitting: Internal Medicine

## 2013-05-27 ENCOUNTER — Encounter: Payer: Self-pay | Admitting: Internal Medicine

## 2013-05-27 VITALS — BP 128/80 | HR 62 | Temp 98.0°F | Ht 67.0 in | Wt 201.6 lb

## 2013-05-27 DIAGNOSIS — J45991 Cough variant asthma: Secondary | ICD-10-CM

## 2013-05-27 MED ORDER — MOMETASONE FURO-FORMOTEROL FUM 200-5 MCG/ACT IN AERO: INHALATION_SPRAY | RESPIRATORY_TRACT | Status: DC

## 2013-05-27 NOTE — Progress Notes (Signed)
Subjective:    Patient ID: Luke [name], male    DOB: 03-02-1946  MRN: [id number]   Brief patient profile:  41 yowm never smoker onset of bad bronchitis as child 100% recovered then noted teenager sneezing and coughing in fall mostly then shifted to spring in 40's > fall typically responsive to pred referred 04/15/2013 to pulmonary clinic by Dr Christie Nottingham for refractory to pred coughing started Nov 2014   History of Present Illness  04/15/2013 1st [facility] office visit/ Wert Chief Complaint  Patient presents with  . Pulmonary Consult    Referred per Dr. Stevie Kern. Pt c/o cough and SOB that comes and goes since Nov 2014. Cough is prod in the am and green sputum, but turns to clear throughout the day. Cough seems to be worse at night.   worse at hs assoc with sensation of wheeze and not coughing as much during the day  On advair prn proair last took it sev days prior to OV  Seemed to help prilosec qd not necessarily ac and has freq dyspepsia even on ppi rec Stop advair Start symbicort 80 Take 2 puffs first thing in am and then another 2 puffs about 12 hours later.  change prilosec Take 30-60 min before first meal of the day and Pepcid 20 ac 20 mg one at bedtime Augmentin 875 mg take one pill twice daily  X 10 days - take at breakfast and supper with large glass of water.  It would help reduce the usual side effects (diarrhea and yeast infections) if you ate cultured yogurt at lunch.  GERD   04/29/2013 f/u ov/Wert re: cough since Nov 2014  Chief Complaint  Patient presents with  . Follow-up    Pt states that his cough is approx 85% better. He stopped the symbiort due to having cramps and is taking advair diskus prn- about every other day.   main problem is sense of pnds at hs rec Add chlortrimeton 4 mg at bedtime to the pepcid to see what effect it has on the night time tickle> resolved Restart symbicort 160 one every 12 hours  If not satisfied add singulair 10 mg one  each pm > never added    05/27/2013 f/u ov/Wert re:  Cough variant asthma / on symbicort 160 one puff qod  Chief Complaint  Patient presents with  . Follow-up    Pt states that his cough has almost resolved. He has occ wheezing and uses symbicort about once every other day and this helps.    Not limited by breathing from desired activities, not needing any saba at this point  No obvious day to day or daytime variabilty or assoc sob or cp or chest tightness, subjective wheeze overt sinus or hb symptoms. No unusual exp hx or h/o childhood pna/ asthma or knowledge of premature birth.  Sleeping ok without nocturnal  or early am exacerbation  of respiratory  c/o's or need for noct saba. Also denies any obvious fluctuation of symptoms with weather or environmental changes or other aggravating or alleviating factors except as outlined above   Current Medications, Allergies, Complete Past Medical History, Past Surgical History, Family History, and Social History were reviewed in [employer] record.  ROS  The following are not active complaints unless bolded sore throat, dysphagia, dental problems, itching, sneezing,  nasal congestion or excess/ purulent secretions, ear ache,   fever, chills, sweats, unintended wt loss, pleuritic or exertional cp, hemoptysis,  orthopnea pnd  or leg swelling, presyncope, palpitations, heartburn, abdominal pain, anorexia, nausea, vomiting, diarrhea  or change in bowel or urinary habits, change in stools or urine, dysuria,hematuria,  rash, arthralgias, visual complaints, headache, numbness weakness or ataxia or problems with walking or coordination,  change in mood/affect or memory.                   Objective:   Physical Exam  amb wm    04/29/2013       201  > 202 05/27/2013  Wt Readings from Last 3 Encounters:  04/15/13 204 lb 12.8 oz (92.897 kg)  03/30/13 203 lb (92.08 kg)  02/19/13 206 lb (93.441 kg)      HEENT: nl dentition,  turbinates, and orophanx. Nl external ear canals without cough reflex   NECK :  without JVD/Nodes/TM/ nl carotid upstrokes bilaterally   LUNGS: no acc muscle use, clear to A and P bilaterally     CV:  RRR  no s3 or murmur or increase in P2, no edema   ABD:  soft and nontender with nl excursion in the supine position. No bruits or organomegaly, bowel sounds nl  MS:  warm without deformities, calf tenderness, cyanosis or clubbing         CXR  04/29/2013 :  Low lung volumes with vascular crowding and bibasilar atelectasis.         Assessment & Plan:

## 2013-05-27 NOTE — Patient Instructions (Addendum)
dulera 200 one - two puffs every 12 hours as needed for cough or breathing   If you are satisfied with your treatment plan let your doctor know and he/she can either refill your medications or you can return here when your prescription runs out.     If in any way you are not 100% satisfied,  please tell us.  If 100% better, tell your friends!

## 2013-05-28 NOTE — Assessment & Plan Note (Addendum)
I had an extended summary discussion with the patient today lasting 15 to 20 minutes of a 25 minute visit on the following issues:   He is very sensitive to the LABA and his disease is so mild he could probably get control of cough with qvar 40 2bid but he refuses to take anything on a regular basis so, per his insurance restrictions, best choice to fit his needs is duler 200 dosed as high as 2bid but taper down to one puff qod if doing great.    Each maintenance medication was reviewed in detail including most importantly the difference between maintenance and as needed and under what circumstances the prns are to be used.  Please see instructions for details which were reviewed in writing and the patient given a copy.

## 2013-07-14 ENCOUNTER — Telehealth: Payer: Self-pay | Admitting: Family Medicine

## 2013-07-14 NOTE — Telephone Encounter (Signed)
Please call and schedule patient at 8:15.  Okay per Dr Sherren Mocha

## 2013-07-14 NOTE — Telephone Encounter (Signed)
Pt is requesting Thursday  Morning appt with dr todd for neck pain. Can I sch?

## 2013-07-14 NOTE — Telephone Encounter (Signed)
Pt has been sch

## 2013-07-16 ENCOUNTER — Ambulatory Visit (INDEPENDENT_AMBULATORY_CARE_PROVIDER_SITE_OTHER): Payer: Medicare Other | Admitting: Family Medicine

## 2013-07-16 ENCOUNTER — Ambulatory Visit (INDEPENDENT_AMBULATORY_CARE_PROVIDER_SITE_OTHER)
Admission: RE | Admit: 2013-07-16 | Discharge: 2013-07-16 | Disposition: A | Discharge status: Home / Self Care | Payer: Medicare Other | Source: Ambulatory Visit | Attending: Family Medicine | Admitting: Family Medicine

## 2013-07-16 ENCOUNTER — Encounter: Payer: Self-pay | Admitting: Family Medicine

## 2013-07-16 VITALS — BP 140/90 | Temp 97.9°F | Wt 200.0 lb

## 2013-07-16 DIAGNOSIS — M542 Cervicalgia: Secondary | ICD-10-CM | POA: Diagnosis not present

## 2013-07-16 DIAGNOSIS — M47812 Spondylosis without myelopathy or radiculopathy, cervical region: Secondary | ICD-10-CM | POA: Diagnosis not present

## 2013-07-16 MED ORDER — TRAMADOL HCL 50 MG PO TABS: ORAL_TABLET | ORAL | Status: DC

## 2013-07-16 NOTE — Progress Notes (Signed)
   Subjective:    Patient ID: Luke [name], male    DOB: [birth year], 68 y.o.   MRN: [id number]  HPI Luke [name] is a 68 year old married male nonsmoker who comes in today for evaluation of neck pain  He states it's been going on for approximately 8-12 months. He does not recall any specific incidents that triggered it. He describes it as a cramp that comes and goes and lasts about 30 seconds the frequency is once or twice per day. Sometimes it wakes him up at night. He's denies any numbness or tingling or muscle strength change in his upper chest remedies.  No history of, trauma   Review of Systems    review of systems otherwise negative Objective:   Physical Exam  Well-developed well-nourished male no acute distress vital signs stable he is afebrile neck shows full range of motion with no duplication the pain. Sensation muscle strength reflexes upper extremities are within normal limits        Assessment & Plan:  Cervical discs pain.......Marland Kitchen begin workup with plain x-rays and physical therapy. Avoid NSAIDs because of a history of GI bleed with aspirin

## 2013-07-16 NOTE — Patient Instructions (Signed)
Avoid all aspirin products and over-the-counter NSAIDs. Tramadol 50 mg........... one half to one tablet twice daily. For severe pain otherwise Tylenol 2 tabs 3 times daily  We will set you up a consult for physical therapy  Go to the main office now for x-rays of your neck

## 2013-07-16 NOTE — Progress Notes (Signed)
Pre visit review using our clinic review tool, if applicable. No additional management support is needed unless otherwise documented below in the visit note. 

## 2013-07-20 ENCOUNTER — Telehealth: Payer: Self-pay | Admitting: Family Medicine

## 2013-07-20 DIAGNOSIS — I10 Essential (primary) hypertension: Secondary | ICD-10-CM

## 2013-07-20 MED ORDER — LOSARTAN POTASSIUM 100 MG PO TABS: ORAL_TABLET | ORAL | Status: DC

## 2013-07-20 MED ORDER — AMLODIPINE BESYLATE 5 MG PO TABS: ORAL_TABLET | ORAL | Status: DC

## 2013-07-20 NOTE — Telephone Encounter (Signed)
[facility] is requesting re-fills on the following amLODipine (NORVASC) 5 MG tablet and  losartan (COZAAR) 100 MG tablet

## 2013-07-21 ENCOUNTER — Ambulatory Visit: Payer: Medicare Other | Attending: Family Medicine | Admitting: Physical Therapy

## 2013-07-21 DIAGNOSIS — IMO0001 Reserved for inherently not codable concepts without codable children: Secondary | ICD-10-CM | POA: Diagnosis not present

## 2013-07-21 DIAGNOSIS — M542 Cervicalgia: Secondary | ICD-10-CM | POA: Diagnosis not present

## 2013-07-24 ENCOUNTER — Ambulatory Visit: Payer: Medicare Other | Admitting: Physical Therapy

## 2013-07-24 DIAGNOSIS — IMO0001 Reserved for inherently not codable concepts without codable children: Secondary | ICD-10-CM | POA: Diagnosis not present

## 2013-07-28 ENCOUNTER — Ambulatory Visit: Payer: Medicare Other | Admitting: Physical Therapy

## 2013-07-28 DIAGNOSIS — IMO0001 Reserved for inherently not codable concepts without codable children: Secondary | ICD-10-CM | POA: Diagnosis not present

## 2013-07-30 ENCOUNTER — Ambulatory Visit: Payer: Medicare Other | Admitting: Physical Therapy

## 2013-08-04 ENCOUNTER — Ambulatory Visit: Payer: Medicare Other | Admitting: Physical Therapy

## 2013-08-06 ENCOUNTER — Encounter: Payer: Medicare Other | Admitting: Physical Therapy

## 2013-08-13 ENCOUNTER — Ambulatory Visit: Payer: Medicare Other | Attending: Family Medicine | Admitting: Physical Therapy

## 2013-08-13 DIAGNOSIS — IMO0001 Reserved for inherently not codable concepts without codable children: Secondary | ICD-10-CM | POA: Insufficient documentation

## 2013-08-13 DIAGNOSIS — M542 Cervicalgia: Secondary | ICD-10-CM | POA: Insufficient documentation

## 2013-09-21 ENCOUNTER — Other Ambulatory Visit: Payer: Self-pay | Admitting: Family Medicine

## 2013-09-25 ENCOUNTER — Telehealth: Payer: Self-pay | Admitting: Family Medicine

## 2013-09-25 DIAGNOSIS — E785 Hyperlipidemia, unspecified: Secondary | ICD-10-CM

## 2013-09-25 DIAGNOSIS — I10 Essential (primary) hypertension: Secondary | ICD-10-CM

## 2013-09-25 NOTE — Telephone Encounter (Signed)
Pt would like to have labs in advance pt has medicare. Please put order in system and I will call pt to sch

## 2013-09-28 NOTE — Telephone Encounter (Signed)
Labs ordered.

## 2013-10-07 ENCOUNTER — Telehealth: Payer: Self-pay | Admitting: Family Medicine

## 2013-10-07 DIAGNOSIS — M25511 Pain in right shoulder: Secondary | ICD-10-CM

## 2013-10-07 NOTE — Telephone Encounter (Signed)
Pt states he has an issue w/ his scapula/ (shoulder blade) there is a spot on itop part when you press on, its really sore. Pt states its been going on for months, but he would like to get this looked at prior to leaving on vaction next week.  Prefers first thing thurs am

## 2013-10-08 NOTE — Telephone Encounter (Signed)
Spoke with wife. Per Dr Sherren Mocha patient should go to ortho.  Patient to call back to inform us which shoulder.

## 2013-10-19 DIAGNOSIS — M719 Bursopathy, unspecified: Secondary | ICD-10-CM | POA: Diagnosis not present

## 2013-10-19 DIAGNOSIS — M47812 Spondylosis without myelopathy or radiculopathy, cervical region: Secondary | ICD-10-CM | POA: Diagnosis not present

## 2013-10-19 DIAGNOSIS — M67919 Unspecified disorder of synovium and tendon, unspecified shoulder: Secondary | ICD-10-CM | POA: Diagnosis not present

## 2013-10-27 ENCOUNTER — Other Ambulatory Visit (INDEPENDENT_AMBULATORY_CARE_PROVIDER_SITE_OTHER): Payer: Medicare Other

## 2013-10-27 DIAGNOSIS — E785 Hyperlipidemia, unspecified: Secondary | ICD-10-CM

## 2013-10-27 DIAGNOSIS — I10 Essential (primary) hypertension: Secondary | ICD-10-CM | POA: Diagnosis not present

## 2013-10-27 LAB — CBC WITH DIFFERENTIAL/PLATELET
BASOS ABS: 0 10*3/uL (ref 0.0–0.1)
Basophils Relative: 0.4 % (ref 0.0–3.0)
EOS ABS: 0.5 10*3/uL (ref 0.0–0.7)
Eosinophils Relative: 5.6 % — ABNORMAL HIGH (ref 0.0–5.0)
HCT: 41.1 % (ref 39.0–52.0)
Hemoglobin: 13.9 g/dL (ref 13.0–17.0)
Lymphocytes Relative: 22.9 % (ref 12.0–46.0)
Lymphs Abs: 2.2 10*3/uL (ref 0.7–4.0)
MCHC: 33.8 g/dL (ref 30.0–36.0)
MCV: 84.6 fl (ref 78.0–100.0)
MONO ABS: 0.5 10*3/uL (ref 0.1–1.0)
Monocytes Relative: 4.8 % (ref 3.0–12.0)
NEUTROS PCT: 66.3 % (ref 43.0–77.0)
Neutro Abs: 6.4 10*3/uL (ref 1.4–7.7)
PLATELETS: 296 10*3/uL (ref 150.0–400.0)
RBC: 4.86 Mil/uL (ref 4.22–5.81)
RDW: 13.8 % (ref 11.5–15.5)
WBC: 9.7 10*3/uL (ref 4.0–10.5)

## 2013-10-27 LAB — POCT URINALYSIS DIPSTICK
Bilirubin, UA: NEGATIVE
Blood, UA: NEGATIVE
Glucose, UA: NEGATIVE
Ketones, UA: NEGATIVE
Nitrite, UA: NEGATIVE
PH UA: 5
Spec Grav, UA: 1.02
UROBILINOGEN UA: 0.2

## 2013-10-27 LAB — BASIC METABOLIC PANEL
BUN: 16 mg/dL (ref 6–23)
CALCIUM: 9.2 mg/dL (ref 8.4–10.5)
CO2: 28 mEq/L (ref 19–32)
CREATININE: 1.1 mg/dL (ref 0.4–1.5)
Chloride: 104 mEq/L (ref 96–112)
GFR: 73.93 mL/min (ref >60.00)
Glucose, Bld: 108 mg/dL — ABNORMAL HIGH (ref 70–99)
Potassium: 4.5 mEq/L (ref 3.5–5.1)
Sodium: 139 mEq/L (ref 135–145)

## 2013-10-27 LAB — LIPID PANEL
Cholesterol: 150 mg/dL (ref 0–200)
HDL: 26.5 mg/dL — ABNORMAL LOW (ref >39.00)
NonHDL: 123.5
Total CHOL/HDL Ratio: 6
Triglycerides: 290 mg/dL — ABNORMAL HIGH (ref 0.0–149.0)
VLDL: 58 mg/dL — ABNORMAL HIGH (ref 0.0–40.0)

## 2013-10-27 LAB — MICROALBUMIN / CREATININE URINE RATIO
Creatinine,U: 204.6 mg/dL
Microalb Creat Ratio: 0.6 mg/g (ref 0.0–30.0)
Microalb, Ur: 1.3 mg/dL (ref 0.0–1.9)

## 2013-10-27 LAB — TSH: TSH: 1.74 u[IU]/mL (ref 0.35–4.50)

## 2013-10-27 LAB — HEPATIC FUNCTION PANEL
ALK PHOS: 78 U/L (ref 39–117)
ALT: 19 U/L (ref 0–53)
AST: 23 U/L (ref 0–37)
Albumin: 3.6 g/dL (ref 3.5–5.2)
BILIRUBIN TOTAL: 0.5 mg/dL (ref 0.2–1.2)
Bilirubin, Direct: 0.1 mg/dL (ref 0.0–0.3)
Total Protein: 6.6 g/dL (ref 6.0–8.3)

## 2013-10-27 LAB — PSA: PSA: 2.11 ng/mL (ref 0.10–4.00)

## 2013-10-27 LAB — LDL CHOLESTEROL, DIRECT: Direct LDL: 82.7 mg/dL

## 2013-11-12 ENCOUNTER — Ambulatory Visit (INDEPENDENT_AMBULATORY_CARE_PROVIDER_SITE_OTHER): Payer: Medicare Other | Admitting: Family Medicine

## 2013-11-12 ENCOUNTER — Encounter: Payer: Self-pay | Admitting: Family Medicine

## 2013-11-12 VITALS — BP 140/90 | Temp 97.9°F | Ht 66.25 in | Wt 201.0 lb

## 2013-11-12 DIAGNOSIS — B009 Herpesviral infection, unspecified: Secondary | ICD-10-CM

## 2013-11-12 DIAGNOSIS — Z23 Encounter for immunization: Secondary | ICD-10-CM | POA: Diagnosis not present

## 2013-11-12 DIAGNOSIS — K2100 Gastro-esophageal reflux disease with esophagitis, without bleeding: Secondary | ICD-10-CM

## 2013-11-12 DIAGNOSIS — M542 Cervicalgia: Secondary | ICD-10-CM

## 2013-11-12 DIAGNOSIS — M5020 Other cervical disc displacement, unspecified cervical region: Secondary | ICD-10-CM

## 2013-11-12 DIAGNOSIS — F528 Other sexual dysfunction not due to a substance or known physiological condition: Secondary | ICD-10-CM

## 2013-11-12 DIAGNOSIS — J45909 Unspecified asthma, uncomplicated: Secondary | ICD-10-CM

## 2013-11-12 DIAGNOSIS — K921 Melena: Secondary | ICD-10-CM

## 2013-11-12 DIAGNOSIS — E785 Hyperlipidemia, unspecified: Secondary | ICD-10-CM | POA: Diagnosis not present

## 2013-11-12 DIAGNOSIS — I10 Essential (primary) hypertension: Secondary | ICD-10-CM

## 2013-11-12 MED ORDER — SILDENAFIL CITRATE 20 MG PO TABS: 20.0000 mg | ORAL_TABLET | Freq: Three times a day (TID) | ORAL | Status: DC

## 2013-11-12 MED ORDER — OMEPRAZOLE 20 MG PO CPDR: 20.0000 mg | DELAYED_RELEASE_CAPSULE | Freq: Every day | ORAL | Status: DC

## 2013-11-12 MED ORDER — LOSARTAN POTASSIUM 100 MG PO TABS: ORAL_TABLET | ORAL | Status: DC

## 2013-11-12 MED ORDER — BUDESONIDE-FORMOTEROL FUMARATE 160-4.5 MCG/ACT IN AERO: 2.0000 | INHALATION_SPRAY | Freq: Two times a day (BID) | RESPIRATORY_TRACT | Status: DC

## 2013-11-12 MED ORDER — ACYCLOVIR 200 MG PO CAPS: ORAL_CAPSULE | ORAL | Status: DC

## 2013-11-12 MED ORDER — SIMVASTATIN 20 MG PO TABS: ORAL_TABLET | ORAL | Status: DC

## 2013-11-12 MED ORDER — TRAMADOL HCL 50 MG PO TABS: ORAL_TABLET | ORAL | Status: DC

## 2013-11-12 MED ORDER — AMLODIPINE BESYLATE 5 MG PO TABS: ORAL_TABLET | ORAL | Status: DC

## 2013-11-12 NOTE — Progress Notes (Signed)
   Subjective:    Patient ID: Luke [name], male    DOB: 04-02-1945, 68 y.o.   MRN: [id number]  HPI Luke [name] is a 68 year old male nonsmoker who comes in today for evaluation of hypertension, asthma, allergic rhinitis, reflux esophagitis, hyperlipidemia, chronic neck pain, erectile dysfunction  He says overall he feels well except his neck seems to be getting worse. He saw Dr. Alfonso [name] the family physician with the orthopedic group and was told a year ago he had arthritis in his neck. His pain is fairly constant it's a dull pain sometimes the 12 and up to 5. It does not radiate down his arms he has no numbness or weakness. No previous history of trauma  I gets routine eye care, dental care, do for followup colonoscopy.  Vaccinations updated by Luke [name]  Cognitive function normal he walks daily he and his wife continue to advance. Home health safety reviewed no issues identified, no guns in the house, he does have a health care power of attorney and living well  Review of Systems  Constitutional: Negative.   HENT: Negative.   Eyes: Negative.   Respiratory: Negative.   Cardiovascular: Negative.   Gastrointestinal: Negative.   Genitourinary: Negative.   Musculoskeletal: Negative.   Skin: Negative.   Neurological: Negative.   Psychiatric/Behavioral: Negative.        Objective:   Physical Exam  Nursing note and vitals reviewed. Constitutional: He is oriented to person, place, and time. He appears well-developed and well-nourished.  HENT:  Head: Normocephalic and atraumatic.  Right Ear: External ear normal.  Left Ear: External ear normal.  Nose: Nose normal.  Mouth/Throat: Oropharynx is clear and moist.  Eyes: Conjunctivae and EOM are normal. Pupils are equal, round, and reactive to light.  Neck: Normal range of motion. Neck supple. No JVD present. No tracheal deviation present. No thyromegaly present.  Cardiovascular: Normal rate, regular rhythm, normal heart sounds and intact  distal pulses.  Exam reveals no gallop and no friction rub.   No murmur heard. No carotid bruits for pulses 1+ symmetrical  Pulmonary/Chest: Effort normal and breath sounds normal. No stridor. No respiratory distress. He has no wheezes. He has no rales. He exhibits no tenderness.  Abdominal: Soft. Bowel sounds are normal. He exhibits no distension and no mass. There is no tenderness. There is no rebound and no guarding.  Genitourinary: Prostate normal and penis normal. Guaiac positive stool. No penile tenderness.  Musculoskeletal: Normal range of motion. He exhibits no edema and no tenderness.  Lymphadenopathy:    He has no cervical adenopathy.  Neurological: He is alert and oriented to person, place, and time. He has normal reflexes. No cranial nerve deficit. He exhibits normal muscle tone.  Skin: Skin is warm and dry. No rash noted. No erythema. No pallor.  Total body skin exam normal except for scar midline neck from previous lesion that was removed  Psychiatric: He has a normal mood and affect. His behavior is normal. Judgment and thought content normal.          Assessment & Plan:  Healthy male  Hypertension at goal continue current therapy  Asthma continue Symbicort  Reflux esophagitis continue Prilosec  Hyperlipidemia continue Zocor  Chronic neck pain Aleve twice a day tramadol each bedtime PT consult  Erectile dysfunction refill generic Viagra

## 2013-11-12 NOTE — Patient Instructions (Signed)
Aleve,,,,,,,, one twice daily with food  Tramadol 50 mg.....Marland Kitchen one half to one tablet twice daily when necessary for neck pain  PT consult  Also GI consult because of the heme positive stools  Continue your other medications  Return in one year sooner if any problems

## 2013-11-12 NOTE — Progress Notes (Signed)
Pre visit review using our clinic review tool, if applicable. No additional management support is needed unless otherwise documented below in the visit note. 

## 2013-11-18 DIAGNOSIS — M542 Cervicalgia: Secondary | ICD-10-CM | POA: Diagnosis not present

## 2013-11-18 DIAGNOSIS — M5020 Other cervical disc displacement, unspecified cervical region: Secondary | ICD-10-CM | POA: Diagnosis not present

## 2013-11-20 DIAGNOSIS — M542 Cervicalgia: Secondary | ICD-10-CM | POA: Diagnosis not present

## 2013-11-20 DIAGNOSIS — M5020 Other cervical disc displacement, unspecified cervical region: Secondary | ICD-10-CM | POA: Diagnosis not present

## 2013-11-23 ENCOUNTER — Other Ambulatory Visit: Payer: Self-pay | Admitting: *Deleted

## 2013-11-23 DIAGNOSIS — M542 Cervicalgia: Secondary | ICD-10-CM | POA: Diagnosis not present

## 2013-11-23 DIAGNOSIS — F528 Other sexual dysfunction not due to a substance or known physiological condition: Secondary | ICD-10-CM

## 2013-11-23 DIAGNOSIS — M5020 Other cervical disc displacement, unspecified cervical region: Secondary | ICD-10-CM | POA: Diagnosis not present

## 2013-11-23 MED ORDER — SILDENAFIL CITRATE 20 MG PO TABS: 20.0000 mg | ORAL_TABLET | Freq: Three times a day (TID) | ORAL | Status: DC

## 2013-12-03 DIAGNOSIS — M542 Cervicalgia: Secondary | ICD-10-CM | POA: Diagnosis not present

## 2013-12-03 DIAGNOSIS — M5020 Other cervical disc displacement, unspecified cervical region: Secondary | ICD-10-CM | POA: Diagnosis not present

## 2013-12-18 ENCOUNTER — Encounter: Payer: Self-pay | Admitting: Family Medicine

## 2014-04-27 DIAGNOSIS — K621 Rectal polyp: Secondary | ICD-10-CM | POA: Diagnosis not present

## 2014-04-27 DIAGNOSIS — D126 Benign neoplasm of colon, unspecified: Secondary | ICD-10-CM | POA: Diagnosis not present

## 2014-04-27 DIAGNOSIS — Z86010 Personal history of colonic polyps: Secondary | ICD-10-CM | POA: Diagnosis not present

## 2014-04-27 DIAGNOSIS — Z09 Encounter for follow-up examination after completed treatment for conditions other than malignant neoplasm: Secondary | ICD-10-CM | POA: Diagnosis not present

## 2014-04-27 DIAGNOSIS — K5730 Diverticulosis of large intestine without perforation or abscess without bleeding: Secondary | ICD-10-CM | POA: Diagnosis not present

## 2014-04-27 DIAGNOSIS — D120 Benign neoplasm of cecum: Secondary | ICD-10-CM | POA: Diagnosis not present

## 2014-04-27 DIAGNOSIS — K641 Second degree hemorrhoids: Secondary | ICD-10-CM | POA: Diagnosis not present

## 2014-04-27 HISTORY — PX: COLONOSCOPY: SHX174

## 2014-04-27 LAB — HM COLONOSCOPY

## 2014-04-30 ENCOUNTER — Encounter: Payer: Self-pay | Admitting: Family Medicine

## 2014-05-24 ENCOUNTER — Encounter: Payer: Self-pay | Admitting: Internal Medicine

## 2014-05-24 ENCOUNTER — Ambulatory Visit (INDEPENDENT_AMBULATORY_CARE_PROVIDER_SITE_OTHER): Payer: Medicare Other | Admitting: Internal Medicine

## 2014-05-24 ENCOUNTER — Ambulatory Visit (INDEPENDENT_AMBULATORY_CARE_PROVIDER_SITE_OTHER)
Admission: RE | Admit: 2014-05-24 | Discharge: 2014-05-24 | Disposition: A | Discharge status: Home / Self Care | Payer: Medicare Other | Source: Ambulatory Visit | Attending: Internal Medicine | Admitting: Internal Medicine

## 2014-05-24 VITALS — BP 146/82 | HR 67 | Ht 64.2 in | Wt 199.0 lb

## 2014-05-24 DIAGNOSIS — J45991 Cough variant asthma: Secondary | ICD-10-CM

## 2014-05-24 DIAGNOSIS — R0600 Dyspnea, unspecified: Secondary | ICD-10-CM | POA: Diagnosis not present

## 2014-05-24 DIAGNOSIS — J45909 Unspecified asthma, uncomplicated: Secondary | ICD-10-CM | POA: Diagnosis not present

## 2014-05-24 DIAGNOSIS — R0602 Shortness of breath: Secondary | ICD-10-CM | POA: Diagnosis not present

## 2014-05-24 MED ORDER — BUDESONIDE-FORMOTEROL FUMARATE 160-4.5 MCG/ACT IN AERO: 2.0000 | INHALATION_SPRAY | Freq: Two times a day (BID) | RESPIRATORY_TRACT | Status: DC

## 2014-05-24 NOTE — Progress Notes (Signed)
Subjective:    Patient ID: Luke [name], male    DOB: 06-Jun-1945  MRN: [id number]   Brief patient profile:  43 yowm never smoker onset of bad bronchitis as child 100% recovered then noted teenager sneezing and coughing in fall mostly then shifted to spring in 40's > fall typically responsive to pred referred 04/15/2013 to pulmonary clinic by Dr Christie Nottingham for refractory to pred coughing started Nov 2014   History of Present Illness  04/15/2013 1st [facility] office visit/ Luke [name] Chief Complaint  Patient presents with  . Pulmonary Consult    Referred per Dr. Stevie Kern. Pt c/o cough and SOB that comes and goes since Nov 2014. Cough is prod in the am and green sputum, but turns to clear throughout the day. Cough seems to be worse at night.   worse at hs assoc with sensation of wheeze and not coughing as much during the day  On advair prn proair last took it sev days prior to OV  Seemed to help prilosec qd not necessarily ac and has freq dyspepsia even on ppi rec Stop advair Start symbicort 80 Take 2 puffs first thing in am and then another 2 puffs about 12 hours later.  change prilosec Take 30-60 min before first meal of the day and Pepcid 20 ac 20 mg one at bedtime Augmentin 875 mg take one pill twice daily  X 10 days - take at breakfast and supper with large glass of water.  It would help reduce the usual side effects (diarrhea and yeast infections) if you ate cultured yogurt at lunch.  GERD   04/29/2013 f/u ov/Luke [name] re: cough since Nov 2014  Chief Complaint  Patient presents with  . Follow-up    Pt states that his cough is approx 85% better. He stopped the symbiort due to having cramps and is taking advair diskus prn- about every other day.   main problem is sense of pnds at hs rec Add chlortrimeton 4 mg at bedtime to the pepcid to see what effect it has on the night time tickle> resolved Restart symbicort 160 one every 12 hours  If not satisfied add singulair 10 mg one  each pm > never added    05/27/2013 f/u ov/Luke [name] re:  Cough variant asthma / on symbicort 160 one puff qod  Chief Complaint  Patient presents with  . Follow-up    Pt states that his cough has almost resolved. He has occ wheezing and uses symbicort about once every other day and this helps.    Not limited by breathing from desired activities, not needing any saba at this point rec dulera 200 one - two puffs every 12 hours as needed for cough or breathing    05/24/2014 f/u ov/Luke [name] re: symbicort 160 .2bid Chief Complaint  Patient presents with  . Acute Visit    Pt c/o sob, he is having trouble taking deep breath. pt denies chest tightness/wheeze or cough.  cough is resolved/ new symptoms = sensation of low IC x sev months much worse but in retrospect x years Making progress with treadmil > becoming easier to do more exercise and chest tightness just as severe at rest as with ex, comes an goes with several breaths.  No obvious day to day or daytime variabilty or assoc cough or sob or cp   , subjective wheeze overt sinus or hb symptoms. No unusual exp hx or h/o childhood pna/ asthma or knowledge of premature birth.  Sleeping ok without  nocturnal  or early am exacerbation  of respiratory  c/o's or need for noct saba. Also denies any obvious fluctuation of symptoms with weather or environmental changes or other aggravating or alleviating factors except as outlined above   Current Medications, Allergies, Complete Past Medical History, Past Surgical History, Family History, and Social History were reviewed in [employer] record.  ROS  The following are not active complaints unless bolded sore throat, dysphagia, dental problems, itching, sneezing,  nasal congestion or excess/ purulent secretions, ear ache,   fever, chills, sweats, unintended wt loss, pleuritic or exertional cp, hemoptysis,  orthopnea pnd or leg swelling, presyncope, palpitations, heartburn, abdominal pain,  anorexia, nausea, vomiting, diarrhea  or change in bowel or urinary habits, change in stools or urine, dysuria,hematuria,  rash, arthralgias, visual complaints, headache, numbness weakness or ataxia or problems with walking or coordination,  change in mood/affect or memory.                   Objective:   Physical Exam  amb wm    04/29/2013       201  >  202 05/27/2013 >  05/24/2014 199 Wt Readings from Last 3 Encounters:  04/15/13 204 lb 12.8 oz (92.897 kg)  03/30/13 203 lb (92.08 kg)  02/19/13 206 lb (93.441 kg)      HEENT: nl dentition, turbinates, and orophanx. Nl external ear canals without cough reflex   NECK :  without JVD/Nodes/TM/ nl carotid upstrokes bilaterally   LUNGS: no acc muscle use, clear to A and P bilaterally     CV:  RRR  no s3 or murmur or increase in P2, no edema   ABD:  soft and nontender with nl excursion in the supine position. No bruits or organomegaly, bowel sounds nl  MS:  warm without deformities, calf tenderness, cyanosis or clubbing        CXR PA and Lateral:   05/24/2014 :     I personally reviewed images and agree with radiology impression as follows:     No active cardiopulmonary disease./ nl lung vol        Assessment & Plan:

## 2014-05-24 NOTE — Patient Instructions (Addendum)
Try Symbicort 160 automatically Take 2 puffs first thing in am and then another 2 puffs about 12 hours later.   Prilosec 20 mg Take 30-60 min before first meal of the day and pepcid 20 mg at bedtime until you return   GERD (REFLUX)  is an extremely common cause of respiratory symptoms just like yours , many times with no obvious heartburn at all.    It can be treated with medication, but also with lifestyle changes including avoidance of late meals, excessive alcohol, smoking cessation, and avoid fatty foods, chocolate, peppermint, colas, red wine, and acidic juices such as orange juice.  NO MINT OR MENTHOL PRODUCTS SO NO COUGH DROPS  USE SUGARLESS CANDY INSTEAD (Jolley ranchers or Stover's or Life Savers) or even ice chips will also do - the key is to swallow to prevent all throat clearing. NO OIL BASED VITAMINS - use powdered substitutes.  Please remember to go to the   x-ray department downstairs for your tests - we will call you with the results when they are available.      Please schedule a follow up office visit in 4 weeks, sooner if needed with pfts on return

## 2014-05-25 ENCOUNTER — Encounter: Payer: Self-pay | Admitting: Internal Medicine

## 2014-05-25 ENCOUNTER — Telehealth: Payer: Self-pay | Admitting: Internal Medicine

## 2014-05-25 DIAGNOSIS — R0600 Dyspnea, unspecified: Secondary | ICD-10-CM | POA: Insufficient documentation

## 2014-05-25 NOTE — Assessment & Plan Note (Signed)
05/24/2014  Walked RA x 3 laps @ 185 ft each stopped due to  End of study, fast pace, no chest tightness or sob or desat  See comments re ddx under cough variant asthma/ no specific rx needed

## 2014-05-25 NOTE — Telephone Encounter (Signed)
Pt's Symbicort needs a PA. [place]. We did not receive fax yesterday. Will leave in triage to follow up on.

## 2014-05-25 NOTE — Assessment & Plan Note (Addendum)
Cough has improved on rx with laba/ics but now Symptoms of chest tightness at rest no worse with ex  are   disproportionate to objective findings and not clear this is a lung problem but pt does appear to have difficult airway management issues. DDX of  difficult airways management all start with A and  include Adherence, Ace Inhibitors, Acid Reflux, Active Sinus Disease, Alpha 1 Antitripsin deficiency, Anxiety masquerading as Airways dz,  ABPA,  allergy(esp in young), Aspiration (esp in elderly), Adverse effects of DPI,  Active smokers, plus two Bs  = Bronchiectasis and Beta blocker use..and one C= CHF    Adherence is always the initial "prime suspect" and is a multilayered concern that requires a "trust but verify" approach in every patient - starting with knowing how to use medications, especially inhalers, correctly, keeping up with refills and understanding the fundamental difference between maintenance and prns vs those medications only taken for a very short course and then stopped and not refilled.  The proper method of use, as well as anticipated side effects, of a metered-dose inhaler are discussed and demonstrated to the patient. Improved effectiveness after extensive coaching during this visit to a level of approximately  90% so continue symbicort for now  ? Acid (or non-acid) GERD > always difficult to exclude as up to 75% of pts in some series report no assoc GI/ Heartburn symptoms> rec max (24h)  acid suppression and diet restrictions/ reviewed and instructions given in writing.   ? Anxiety/ usually dx of exclusion but higher on list based on classic pattern of sob x 1-2 breaths then back to baseline > reviewed need to relax between breaths   F/u with pfts next ov   See instructions for specific recommendations which were reviewed directly with the patient who was given a copy with highlighter outlining the key components.

## 2014-05-26 NOTE — Progress Notes (Signed)
Quick Note:  Spoke with pt and notified of results per Dr. Wert. Pt verbalized understanding and denied any questions.  ______ 

## 2014-05-26 NOTE — Telephone Encounter (Signed)
Called and spoke to [employer]. PA was faxed to wrong fax number. PA re-faxed. Will hold in triage till fax is received.

## 2014-05-27 ENCOUNTER — Other Ambulatory Visit: Payer: Self-pay | Admitting: *Deleted

## 2014-05-27 MED ORDER — MOMETASONE FURO-FORMOTEROL FUM 200-5 MCG/ACT IN AERO: 2.0000 | INHALATION_SPRAY | Freq: Two times a day (BID) | RESPIRATORY_TRACT | Status: DC

## 2014-05-27 NOTE — Telephone Encounter (Signed)
Called PA department at [phone]. Initiated PA for Symbicort (pt has already tried and failed [place] and Advair per med list). PA approved through 05/27/15. Confirmation #: [id number]. [facility] to notify them of approval. Rx ran with approval. Nothing further needed.

## 2014-05-27 NOTE — Progress Notes (Signed)
Paper refill from Pharmacy, Ins will not cover Symbicort suggest changing to [facility]. Per DrMarland Kitchen [place] 200 2 puffs bid. Rx submitted.

## 2014-06-29 ENCOUNTER — Ambulatory Visit (INDEPENDENT_AMBULATORY_CARE_PROVIDER_SITE_OTHER): Payer: Medicare Other | Admitting: Family Medicine

## 2014-06-29 ENCOUNTER — Encounter: Payer: Self-pay | Admitting: Family Medicine

## 2014-06-29 ENCOUNTER — Ambulatory Visit: Payer: PRIVATE HEALTH INSURANCE | Admitting: Internal Medicine

## 2014-06-29 VITALS — BP 130/90 | Temp 98.7°F | Wt 198.0 lb

## 2014-06-29 DIAGNOSIS — L989 Disorder of the skin and subcutaneous tissue, unspecified: Secondary | ICD-10-CM

## 2014-06-29 DIAGNOSIS — L02415 Cutaneous abscess of right lower limb: Secondary | ICD-10-CM | POA: Insufficient documentation

## 2014-06-29 MED ORDER — CEPHALEXIN 500 MG PO CAPS: ORAL_CAPSULE | ORAL | Status: DC

## 2014-06-29 NOTE — Progress Notes (Signed)
Pre visit review using our clinic review tool, if applicable. No additional management support is needed unless otherwise documented below in the visit note. 

## 2014-06-29 NOTE — Progress Notes (Signed)
   Subjective:    Patient ID: Luke [name], male    DOB: [birth year], 69 y.o.   MRN: [id number]  HPI  Luke [name] is a 69 year old married male nonsmoker comes in today for evaluation of a painful swollen red lesion in his right groin  He says it started last Thursday. Discomfort progressively bigger over the last 24 hours. No history of trauma   Review of Systems Review of systems negative    Objective:   Physical Exam Well-developed well-nourished male no acute distress vital signs stable he is afebrile in the supine position there is an egg sized abscess right groin.  After sterile alcohol prep pus was expressed and cultured.       Assessment & Plan:  Abscess right groin............. culture done.....Marland Kitchen oral antibiotics....... warm soaks 4 times a day...Marland KitchenMarland KitchenMarland Kitchen follow-up in 48 hours.Marland Kitchen

## 2014-06-29 NOTE — Patient Instructions (Signed)
Keflex 500 mg............ 2 tabs twice daily  Warm soaks 4 times daily...........Marland Kitchen mash it.........Marland Kitchen redress it  Return Thursday afternoon for follow-up

## 2014-07-01 ENCOUNTER — Ambulatory Visit (INDEPENDENT_AMBULATORY_CARE_PROVIDER_SITE_OTHER): Payer: Medicare Other | Admitting: Family Medicine

## 2014-07-01 ENCOUNTER — Encounter: Payer: Self-pay | Admitting: Family Medicine

## 2014-07-01 VITALS — BP 122/82 | Temp 98.9°F | Wt 196.0 lb

## 2014-07-01 DIAGNOSIS — L02415 Cutaneous abscess of right lower limb: Secondary | ICD-10-CM | POA: Diagnosis not present

## 2014-07-01 NOTE — Progress Notes (Signed)
Pre visit review using our clinic review tool, if applicable. No additional management support is needed unless otherwise documented below in the visit note. 

## 2014-07-01 NOTE — Patient Instructions (Signed)
Starting tomorrow night soak that area twice daily and change the dressing  Continue the oral antibiotics  Return when necessary

## 2014-07-01 NOTE — Progress Notes (Signed)
   Subjective:    Patient ID: Luke [name], male    DOB: 06/14/1945, 69 y.o.   MRN: [id number]  HPI Luke [name] is a 69 year old male comes in today for reevaluation of an abscess in his right groin  We saw him 48 hours ago he had an abscess in his right groin. We started him on oral antibiotics. And hot soaks. It was draining spontaneously then. He comes back today for follow-up.   Review of Systems Review of systems otherwise negative    Objective:   Physical Exam  Well-developed well-nourished male no acute distress vital signs stable he is afebrile examination right groin shows an excise abscess is not draining is very fluctuant.  He was taken the treatment room and after informed consent the area was cleaned with alcohol and Betadine and infuse with 1% Xylocaine with epinephrine. Incision was made pus was drained dry sterile dressing was applied      Assessment & Plan:  Abscess right groin,,,,,,, simple I&D,,,,,,, continue oral antibiotics,,,,,,, soak twice daily return when necessary

## 2014-07-02 LAB — WOUND CULTURE
GRAM STAIN: NONE SEEN
GRAM STAIN: NONE SEEN
Gram Stain: NONE SEEN
Organism ID, Bacteria: NO GROWTH

## 2014-09-14 ENCOUNTER — Telehealth: Payer: Self-pay | Admitting: Family Medicine

## 2014-09-14 NOTE — Telephone Encounter (Signed)
Error

## 2014-09-16 ENCOUNTER — Telehealth: Payer: Self-pay | Admitting: Family Medicine

## 2014-09-16 DIAGNOSIS — R351 Nocturia: Secondary | ICD-10-CM

## 2014-09-16 DIAGNOSIS — I10 Essential (primary) hypertension: Secondary | ICD-10-CM

## 2014-09-16 DIAGNOSIS — E785 Hyperlipidemia, unspecified: Secondary | ICD-10-CM

## 2014-09-16 DIAGNOSIS — F528 Other sexual dysfunction not due to a substance or known physiological condition: Secondary | ICD-10-CM

## 2014-09-16 NOTE — Telephone Encounter (Signed)
Labs ordered.

## 2014-09-16 NOTE — Telephone Encounter (Signed)
Pt is on medicare and would like to have labs in advance. Please put order in system

## 2014-09-17 DIAGNOSIS — N529 Male erectile dysfunction, unspecified: Secondary | ICD-10-CM | POA: Diagnosis not present

## 2014-11-10 ENCOUNTER — Other Ambulatory Visit: Payer: Self-pay | Admitting: *Deleted

## 2014-11-10 DIAGNOSIS — E785 Hyperlipidemia, unspecified: Secondary | ICD-10-CM

## 2014-11-10 MED ORDER — SIMVASTATIN 20 MG PO TABS: 20.0000 mg | ORAL_TABLET | Freq: Every day | ORAL | Status: DC

## 2015-01-06 ENCOUNTER — Ambulatory Visit (INDEPENDENT_AMBULATORY_CARE_PROVIDER_SITE_OTHER): Payer: Medicare Other | Admitting: Family Medicine

## 2015-01-06 VITALS — BP 152/80 | Temp 98.6°F | Wt 196.0 lb

## 2015-01-06 DIAGNOSIS — F32A Depression, unspecified: Secondary | ICD-10-CM

## 2015-01-06 DIAGNOSIS — Z23 Encounter for immunization: Secondary | ICD-10-CM | POA: Diagnosis not present

## 2015-01-06 DIAGNOSIS — F329 Major depressive disorder, single episode, unspecified: Secondary | ICD-10-CM

## 2015-01-06 LAB — CBC
HEMATOCRIT: 42.6 % (ref 39.0–52.0)
Hemoglobin: 14.4 g/dL (ref 13.0–17.0)
MCHC: 33.9 g/dL (ref 30.0–36.0)
MCV: 84.2 fl (ref 78.0–100.0)
PLATELETS: 270 10*3/uL (ref 150.0–400.0)
RBC: 5.07 Mil/uL (ref 4.22–5.81)
RDW: 13.8 % (ref 11.5–15.5)
WBC: 11.1 10*3/uL — AB (ref 4.0–10.5)

## 2015-01-06 LAB — COMPREHENSIVE METABOLIC PANEL
ALBUMIN: 3.8 g/dL (ref 3.5–5.2)
ALK PHOS: 73 U/L (ref 39–117)
ALT: 23 U/L (ref 0–53)
AST: 14 U/L (ref 0–37)
BUN: 16 mg/dL (ref 6–23)
CALCIUM: 9.4 mg/dL (ref 8.4–10.5)
CHLORIDE: 104 meq/L (ref 96–112)
CO2: 30 mEq/L (ref 19–32)
CREATININE: 1.07 mg/dL (ref 0.40–1.50)
GFR: 72.87 mL/min (ref >60.00)
Glucose, Bld: 133 mg/dL — ABNORMAL HIGH (ref 70–99)
POTASSIUM: 4.5 meq/L (ref 3.5–5.1)
Sodium: 141 mEq/L (ref 135–145)
TOTAL PROTEIN: 6.9 g/dL (ref 6.0–8.3)
Total Bilirubin: 0.4 mg/dL (ref 0.2–1.2)

## 2015-01-06 LAB — TSH: TSH: 1.25 u[IU]/mL (ref 0.35–4.50)

## 2015-01-06 NOTE — Progress Notes (Signed)
Luke Reddish, MD Phone: [phone]  Subjective:  Chief complaint-noted  Concern for Depression  HPI:  Onset/Duration (>2 weeks required): several months Factors: best friend died 09-18-14 from cancer (over a year now).   Symptoms (SIGECAPS):  Also complains that he just generally doesn't feel well. Trouble sleeping. Thinks about death a lot and his own mortality but no SI/HI  1. Depressed Mood: Yes (required)  2. Decreased Interest: Yes (required)  3. SI/HI: No, thinks about death coming much more  4. PHQ9 10  5. MDQ if indicated : not indicated   6. Level of Impairment (What do these symptoms get in the way of you doing) somewhat difficult  ROS- No SI/HI as above. No chest pain or shortness of breath.   Medical History including Psychiatric   1. Ever on psych meds: citalopram for anxiety in past- off for several years. Worked well at the time.    Name of meds and # failures: no failures, titrated off  2. Psych history/ever hospitalized: never  3. Alcohol drinks per week: 2; smoking no; other drugs: no  4. History of thyroid disease or anemia: no and labs last year were normal  5. History of traumatic event (PTSD?): no   Specifically abuse: no  Patient Active Problem List   Diagnosis Date Noted  . Depression 01/06/2015    Priority: Medium  . Abscess of right leg 06/29/2014  . Dyspnea 05/25/2014  . Neck pain, bilateral 07/16/2013  . Solitary pulmonary nodule 04/16/2013  . Restless leg syndrome 03/30/2013  . RHINITIS 10/31/2009  . HEADACHE 10/31/2009  . CONTACT DERMATITIS&OTHER ECZEMA DUE UNSPEC CAUSE 10/14/2008  . DEGENERATIVE DISC DISEASE, CERVICAL SPINE, W/RADICULOPATHY 07/28/2008  . INFLAMED SEBORRHEIC KERATOSIS 06/22/2008  . GUAIAC POSITIVE STOOL 05/03/2008  . ASTHMA 11/24/2007  . DEPRESSION 07/08/2007  . Essential hypertension 07/08/2007  . Cough variant asthma 07/02/2007  . Hyperlipidemia 05/02/2007  . ERECTILE DYSFUNCTION, MILD 05/02/2007  . PREMATURE  VENTRICULAR CONTRACTIONS 05/02/2007  . ESOPHAGITIS, REFLUX 05/02/2007    Family history:  1. Any history of psychiatric issues: mother may have death with depression  2. Specifically bipolar: no  Medications- reviewed and updated Current Outpatient Prescriptions  Medication Sig Dispense Refill  . acyclovir (ZOVIRAX) 200 MG capsule TAKE 1 CAPSULE DAILY 90 capsule 3  . amLODipine (NORVASC) 5 MG tablet TAKE 1 TABLET DAILY 100 tablet 3  . fluticasone (FLONASE) 50 MCG/ACT nasal spray Place 2 sprays into both nostrils daily as needed for allergies or rhinitis.    Marland Kitchen losartan (COZAAR) 100 MG tablet TAKE 1 TABLET DAILY 100 tablet 3  . omeprazole (PRILOSEC) 20 MG capsule Take 1 capsule (20 mg total) by mouth daily. 100 capsule 3  . sildenafil (REVATIO) 20 MG tablet Take 1 tablet (20 mg total) by mouth 3 (three) times daily. (Patient taking differently: Take 20 mg by mouth as needed. ) 10 tablet 0  . simvastatin (ZOCOR) 20 MG tablet Take 1 tablet (20 mg total) by mouth at bedtime. 90 tablet 3  . SYMBICORT 160-4.5 MCG/ACT inhaler     . traMADol (ULTRAM) 50 MG tablet 1/2-1 tab twice a day when necessary for pain (Patient not taking: Reported on 01/06/2015) 50 tablet 2   Objective: BP 152/80 mmHg  Temp(Src) 98.6 F (37 C)  Wt 196 lb (88.905 kg) Gen: NAD, resting comfortably CV: RRR no murmurs rubs or gallops Lungs: CTAB no crackles, wheeze, rhonchi Abdomen: soft/nontender/nondistended/normal bowel sounds. No rebound or guarding.  Ext: no edema Skin: warm, dry Neuro:  grossly normal, moves all extremities Psych: no depressed mood or anxiety  Recent TSH:  Lab Results  Component Value Date   TSH 1.25 01/06/2015   Recent CBC (Hgb):  Lab Results  Component Value Date   HGB 14.4 01/06/2015   Recent CMET (baseline):   Chemistry      Component Value Date/Time   NA 141 01/06/2015 1146   K 4.5 01/06/2015 1146   CL 104 01/06/2015 1146   CO2 30 01/06/2015 1146   BUN 16 01/06/2015 1146    CREATININE 1.07 01/06/2015 1146      Component Value Date/Time   CALCIUM 9.4 01/06/2015 1146   ALKPHOS 73 01/06/2015 1146   AST 14 01/06/2015 1146   ALT 23 01/06/2015 1146   BILITOT 0.4 01/06/2015 1146      Baseline EKG if over 40: QT prolongation not proonged on ekg 11/2013    Assessment/Plan:  Depression Start celexa 20mg . Will call if his prior rx has expired. Previously used for anxiety. PHQ9 10  PHQ9 10-14: Moderate Depression-discussed options of medication or counseling/CBT and patient opted for medication. Really thought with loss of close friend that therapy would be best choice. Will follow on monthly basis using PHQ9.  Regarding medication, will assess safety (SI), tolerability (SE), and efficacy (repeat PHQ9) at next visit and titrate up as needed.  Regarding SI, patient denies. Patient declined psychotherapy. Discussed remission may take several months and will reassesses medication/therapy needs after 6 months of remission.  TSH, CBC, CMP without organic cause of depression.    BP previously controlled. Monitor at CPE. Could be anxiety related as tough subject for patient.  BP Readings from Last 3 Encounters:  01/06/15 152/80  07/01/14 122/82  06/29/14 130/90    On labs- mild leukocytosis- repeat with DIff at physical. CBG elevated- consider a1c at CPE Orders Placed This Encounter  Procedures  . Flu Vaccine QUAD 36+ mos IM  . CBC    [place]  . Comprehensive metabolic panel    Luke  . TSH

## 2015-01-06 NOTE — Progress Notes (Deleted)
BP Readings from Last 3 Encounters:  01/06/15 152/80  07/01/14 122/82  06/29/14 130/90   Sinus headaches  Depression- thinks about death a lot

## 2015-01-06 NOTE — Patient Instructions (Addendum)
Patient to check bottle to make sure not expired and can provide refill if it is.   Start Celexa 20mg   Return for physical and update Dr. Sherren Mocha on how you are doing  Update labs in areas that can overlap between depression and physical causes of same symptoms  I am happy to work you in your wife in 6 months past the visit with Dr. Sherren Mocha- they can schedule you at front desk (and wife as well if you have a DPR on file). May create a spot. Does not have to be in my typical >57 years old slots.   Taking the medicine as directed and not missing any doses is one of the best things you can do to treat your depression.  Here are some things to keep in mind:  For medicine citalopram:  1) Side effects (stomach upset, some increased anxiety) may happen before you notice a benefit.  These side effects typically go away over time. 2) Changes to your dose of medicine or a change in medication all together is sometimes necessary 3) Most people need to be on medication at least 6-12 months 4) Many people will notice an improvement within two weeks but the full effect of the medication can take up to 4-6 weeks 5) Stopping the medication when you start feeling better often results in a return of symptoms 6) If you start having thoughts of hurting yourself or others after starting this medicine, please call me at [phone] immediately.

## 2015-01-06 NOTE — Assessment & Plan Note (Signed)
Start celexa 20mg . Will call if his prior rx has expired. Previously used for anxiety. PHQ9 10  PHQ9 10-14: Moderate Depression-discussed options of medication or counseling/CBT and patient opted for medication. Really thought with loss of close friend that therapy would be best choice. Will follow on monthly basis using PHQ9.  Regarding medication, will assess safety (SI), tolerability (SE), and efficacy (repeat PHQ9) at next visit and titrate up as needed.  Regarding SI, patient denies. Patient declined psychotherapy. Discussed remission may take several months and will reassesses medication/therapy needs after 6 months of remission.  TSH, CBC, CMP without organic cause of depression.

## 2015-01-10 ENCOUNTER — Encounter: Payer: Self-pay | Admitting: Family Medicine

## 2015-01-10 ENCOUNTER — Ambulatory Visit (INDEPENDENT_AMBULATORY_CARE_PROVIDER_SITE_OTHER): Payer: Medicare Other | Admitting: Family Medicine

## 2015-01-10 VITALS — BP 153/100 | HR 70 | Temp 98.9°F | Wt 197.0 lb

## 2015-01-10 DIAGNOSIS — R4781 Slurred speech: Secondary | ICD-10-CM | POA: Diagnosis not present

## 2015-01-10 DIAGNOSIS — I10 Essential (primary) hypertension: Secondary | ICD-10-CM | POA: Diagnosis not present

## 2015-01-10 DIAGNOSIS — R1310 Dysphagia, unspecified: Secondary | ICD-10-CM | POA: Diagnosis not present

## 2015-01-10 MED ORDER — AMLODIPINE BESYLATE 10 MG PO TABS: ORAL_TABLET | ORAL | Status: DC

## 2015-01-10 NOTE — Assessment & Plan Note (Signed)
S: poorly controlled. On Amlodipine 5mg , losartan 100mg   BP Readings from Last 3 Encounters:  01/10/15 153/100  01/06/15 152/80  07/01/14 122/82  A/P:Continue current meds but increase amlodipine to 10mg  and have BP recheck in 2 weeks.

## 2015-01-10 NOTE — Progress Notes (Signed)
Garret Reddish, MD  Subjective:  Luke [name] is a 69 y.o. year old very pleasant male patient who presents for/with See problem oriented charting ROS- No chest pain or shortness of breath. No headache or blurry vision. No extremity weakness. No facial asymmetry or weakness.   Past Medical History-  Patient Active Problem List   Diagnosis Date Noted  . Depression 01/06/2015    Priority: Medium  . Essential hypertension 07/08/2007    Priority: Medium  . Abscess of right leg 06/29/2014  . Dyspnea 05/25/2014  . Neck pain, bilateral 07/16/2013  . Solitary pulmonary nodule 04/16/2013  . Restless leg syndrome 03/30/2013  . RHINITIS 10/31/2009  . HEADACHE 10/31/2009  . CONTACT DERMATITIS&OTHER ECZEMA DUE UNSPEC CAUSE 10/14/2008  . DEGENERATIVE DISC DISEASE, CERVICAL SPINE, W/RADICULOPATHY 07/28/2008  . INFLAMED SEBORRHEIC KERATOSIS 06/22/2008  . GUAIAC POSITIVE STOOL 05/03/2008  . ASTHMA 11/24/2007  . DEPRESSION 07/08/2007  . Cough variant asthma 07/02/2007  . Hyperlipidemia 05/02/2007  . ERECTILE DYSFUNCTION, MILD 05/02/2007  . PREMATURE VENTRICULAR CONTRACTIONS 05/02/2007  . ESOPHAGITIS, REFLUX 05/02/2007    Medications- reviewed and updated Current Outpatient Prescriptions  Medication Sig Dispense Refill  . amLODipine (NORVASC) 5 MG tablet TAKE 1 TABLET DAILY 30 tablet 5  . fluticasone (FLONASE) 50 MCG/ACT nasal spray Place 2 sprays into both nostrils daily as needed for allergies or rhinitis.    Marland Kitchen losartan (COZAAR) 100 MG tablet TAKE 1 TABLET DAILY 100 tablet 3  . omeprazole (PRILOSEC) 20 MG capsule Take 1 capsule (20 mg total) by mouth daily. 100 capsule 3  . simvastatin (ZOCOR) 20 MG tablet Take 1 tablet (20 mg total) by mouth at bedtime. 90 tablet 3  . SYMBICORT 160-4.5 MCG/ACT inhaler     . acyclovir (ZOVIRAX) 200 MG capsule TAKE 1 CAPSULE DAILY (Patient not taking: Reported on 01/10/2015) 90 capsule 3  . sildenafil (REVATIO) 20 MG tablet Take 1 tablet (20 mg total) by  mouth 3 (three) times daily. (Patient not taking: Reported on 01/10/2015) 10 tablet 0  . traMADol (ULTRAM) 50 MG tablet 1/2-1 tab twice a day when necessary for pain (Patient not taking: Reported on 01/06/2015) 50 tablet 2   No current facility-administered medications for this visit.    Objective: BP 153/100 mmHg  Pulse 70  Temp(Src) 98.9 F (37.2 C)  Wt 197 lb (89.359 kg) Gen: NAD, resting comfortably CV: RRR no murmurs rubs or gallops Lungs: CTAB no crackles, wheeze, rhonchi Abdomen: soft/nontender/nondistended/normal bowel sounds. No rebound or guarding.  Ext: no edema Skin: warm, dry Neuro: CN II-XII intact, sensation and reflexes normal throughout, 5/5 muscle strength in bilateral upper and lower extremities. Normal finger to nose. Normal rapid alternating movements. Normal gait. No pronator drift. Normal romberg.    Assessment/Plan:  Slurred speech/dyphagia <2 minutes S:At an outside wedding with temperature in upper 60s. Small cup of bear and suddenly felt like he could not swallow- spit it out, difficulty with speech- speech slurred, felt like he was in altered state and felt confused. After 1-2 minutes improved. Very hydrated, wasn't overheated. No new medications recently- had not even started celexa that had been advised.   In regards to aspirin- History of GI bleed after taking 8-9 aspirin a day for several days leading to gastric ulcers. No issues when not taking such high doses. Does not take an aspirin a day  In regards to risk stratification- no diabetes, LDL 83 on simvastatin 20mg . Blood pressure has traditionally been controlled except last 2 visits. age  A/P:  69 year old with <2 minute episode of slurred speech/dysphagia/confusion lasting less than a minute without clear cause. TIA obviously in differential. Neuro exam reassuring for no stroke. We discussed even if we did full MRI/echo/heart monitor/carotid doppler workup- the likely outcome would be to put him on an  aspirin 81mg  as well as consider pushing LDL <70. I told patient I would discuss with his PCP Dr. Sherren Mocha on Wednesday and we would consider more exhaustive workup- patient does not feel strongly about having workup done. My main 2 concerns are 1. Unclear cause and 2. BP riding higher- could this have led to TIA? Emergent precautions to go to ED advised including recurrence.    Essential hypertension S: poorly controlled. On Amlodipine 5mg , losartan 100mg   BP Readings from Last 3 Encounters:  01/10/15 153/100  01/06/15 152/80  07/01/14 122/82  A/P:Continue current meds but increase amlodipine to 10mg  and have BP recheck in 2 weeks.   Meds ordered this encounter  Medications  . amLODipine (NORVASC) 10 MG tablet    Sig: TAKE 1 TABLET DAILY    Dispense:  30 tablet    Refill:  5   Also note,celexa- hasnt started Patient requests Call on cell after consult with Dr. Sherren Mocha

## 2015-01-10 NOTE — Patient Instructions (Addendum)
Start aspirin 81mg   If this recurs, we will need to pursue much deeper investigation  I will run your symptoms by Dr. Sherren Mocha and see if he wants to do this larger workup sooner. i will try to reach out to you by Wednesday or thursday

## 2015-01-26 ENCOUNTER — Other Ambulatory Visit: Payer: Self-pay | Admitting: Family Medicine

## 2015-02-01 ENCOUNTER — Other Ambulatory Visit: Payer: Self-pay | Admitting: Family Medicine

## 2015-02-01 ENCOUNTER — Encounter: Payer: Self-pay | Admitting: Family Medicine

## 2015-02-01 ENCOUNTER — Ambulatory Visit (INDEPENDENT_AMBULATORY_CARE_PROVIDER_SITE_OTHER): Payer: Medicare Other | Admitting: Family Medicine

## 2015-02-01 VITALS — BP 140/90 | Temp 98.5°F | Wt 198.0 lb

## 2015-02-01 DIAGNOSIS — I10 Essential (primary) hypertension: Secondary | ICD-10-CM | POA: Diagnosis not present

## 2015-02-01 DIAGNOSIS — M25562 Pain in left knee: Secondary | ICD-10-CM | POA: Diagnosis not present

## 2015-02-01 MED ORDER — HYDROCHLOROTHIAZIDE 12.5 MG PO TABS: 12.5000 mg | ORAL_TABLET | Freq: Every day | ORAL | Status: DC

## 2015-02-01 NOTE — Progress Notes (Signed)
   Subjective:    Patient ID: Luke [name], male    DOB: [birth year], 69 y.o.   MRN: [id number]  HPI Keiren is a 69 year old married male nonsmoker who comes in today for follow-up of hypertension and left knee pain  He's on 10 mg of Norvasc and Cozaar 100 mg daily. The Norvasc was increased from 5 mg to 10 mg 2 weeks ago because his blood pressure was not normal. Now his BP is 140/90. His blood pressure at home is unknown. He's not checking his blood pressure. He's got [place] cuff that's about 53 or 69 years old.  He's having some fluid retention from increase in Norvasc.  He has been very physically active through the years been involved with dancing. He's had some soreness in his left knee. No locking or swelling. He can't take NSAIDs because of a history of a bleeding ulcer many years ago. He does take an 81 mg baby aspirin daily.   Review of Systems Review of systems otherwise negative except he is due for physical examination soon    Objective:   Physical Exam  Well-developed well-nourished male no acute distress vital signs stable he is afebrile BP right arm sitting position 140/90      Assessment & Plan:  Hypertension not at goal........ continue current therapy follow-up in 2 weeks add HCTZ 12.5 mg  Osteoarthritis left knee........ Tylenol twice a day and ice...Marland KitchenMarland KitchenMarland Kitchen refer to Dr. Tamala Julian if symptoms persist for consideration of injection of cortisone. He's not a candidate for oral anti-inflammatories because of his history of bleeding ulcer

## 2015-02-01 NOTE — Progress Notes (Signed)
Pre visit review using our clinic review tool, if applicable. No additional management support is needed unless otherwise documented below in the visit note. 

## 2015-02-01 NOTE — Patient Instructions (Signed)
Purchase a [employer]ron pump up digital blood pressure cuff..........Marland Kitchen [place]........ check your blood pressure daily in the morning,,,,,,,,,,,, when you return bring a record of all your blood pressure readings and the new device  Continue your current medications  Add HCTZ,,,,,,,,, 12.5 mg daily in the morning  Salt free diet  Walk 30 minutes daily  2 Tylenol twice daily,,,,,,, elevation and ice for 15 minutes prior to bedtime,,,,,,, if the symptoms persist or get worse Dr. Gardenia Phlegm with our group. You can call make urine appointment

## 2015-02-08 ENCOUNTER — Other Ambulatory Visit (INDEPENDENT_AMBULATORY_CARE_PROVIDER_SITE_OTHER): Payer: Medicare Other

## 2015-02-08 DIAGNOSIS — R351 Nocturia: Secondary | ICD-10-CM

## 2015-02-08 DIAGNOSIS — E785 Hyperlipidemia, unspecified: Secondary | ICD-10-CM

## 2015-02-08 DIAGNOSIS — F528 Other sexual dysfunction not due to a substance or known physiological condition: Secondary | ICD-10-CM | POA: Diagnosis not present

## 2015-02-08 DIAGNOSIS — I10 Essential (primary) hypertension: Secondary | ICD-10-CM | POA: Diagnosis not present

## 2015-02-08 LAB — CBC WITH DIFFERENTIAL/PLATELET
BASOS ABS: 0.1 10*3/uL (ref 0.0–0.1)
Basophils Relative: 0.6 % (ref 0.0–3.0)
EOS ABS: 0.5 10*3/uL (ref 0.0–0.7)
EOS PCT: 5.5 % — AB (ref 0.0–5.0)
HCT: 42.4 % (ref 39.0–52.0)
HEMOGLOBIN: 14.1 g/dL (ref 13.0–17.0)
LYMPHS ABS: 2.2 10*3/uL (ref 0.7–4.0)
Lymphocytes Relative: 24.5 % (ref 12.0–46.0)
MCHC: 33.3 g/dL (ref 30.0–36.0)
MCV: 83.9 fl (ref 78.0–100.0)
MONO ABS: 0.5 10*3/uL (ref 0.1–1.0)
Monocytes Relative: 5.9 % (ref 3.0–12.0)
NEUTROS PCT: 63.5 % (ref 43.0–77.0)
Neutro Abs: 5.8 10*3/uL (ref 1.4–7.7)
Platelets: 287 10*3/uL (ref 150.0–400.0)
RBC: 5.05 Mil/uL (ref 4.22–5.81)
RDW: 13.7 % (ref 11.5–15.5)
WBC: 9.1 10*3/uL (ref 4.0–10.5)

## 2015-02-08 LAB — HEPATIC FUNCTION PANEL
ALBUMIN: 4 g/dL (ref 3.5–5.2)
ALT: 20 U/L (ref 0–53)
AST: 15 U/L (ref 0–37)
Alkaline Phosphatase: 77 U/L (ref 39–117)
BILIRUBIN TOTAL: 0.5 mg/dL (ref 0.2–1.2)
Bilirubin, Direct: 0.1 mg/dL (ref 0.0–0.3)
Total Protein: 6.7 g/dL (ref 6.0–8.3)

## 2015-02-08 LAB — LIPID PANEL
CHOL/HDL RATIO: 6
CHOLESTEROL: 138 mg/dL (ref 0–200)
HDL: 25.1 mg/dL — AB (ref >39.00)
NonHDL: 112.97
Triglycerides: 224 mg/dL — ABNORMAL HIGH (ref 0.0–149.0)
VLDL: 44.8 mg/dL — AB (ref 0.0–40.0)

## 2015-02-08 LAB — POCT URINALYSIS DIPSTICK
Bilirubin, UA: NEGATIVE
Blood, UA: NEGATIVE
Glucose, UA: NEGATIVE
Ketones, UA: NEGATIVE
Leukocytes, UA: NEGATIVE
Nitrite, UA: NEGATIVE
Protein, UA: NEGATIVE
Spec Grav, UA: 1.015
Urobilinogen, UA: 0.2
pH, UA: 5

## 2015-02-08 LAB — LDL CHOLESTEROL, DIRECT: LDL DIRECT: 73 mg/dL

## 2015-02-08 LAB — BASIC METABOLIC PANEL
BUN: 21 mg/dL (ref 6–23)
CO2: 28 mEq/L (ref 19–32)
Calcium: 9.4 mg/dL (ref 8.4–10.5)
Chloride: 105 mEq/L (ref 96–112)
Creatinine, Ser: 1.1 mg/dL (ref 0.40–1.50)
GFR: 70.56 mL/min (ref >60.00)
GLUCOSE: 107 mg/dL — AB (ref 70–99)
POTASSIUM: 4.4 meq/L (ref 3.5–5.1)
SODIUM: 142 meq/L (ref 135–145)

## 2015-02-08 LAB — TSH: TSH: 2.08 u[IU]/mL (ref 0.35–4.50)

## 2015-02-08 LAB — PSA: PSA: 1.46 ng/mL (ref 0.10–4.00)

## 2015-02-15 ENCOUNTER — Ambulatory Visit (INDEPENDENT_AMBULATORY_CARE_PROVIDER_SITE_OTHER): Payer: Medicare Other | Admitting: Family Medicine

## 2015-02-15 ENCOUNTER — Encounter: Payer: Self-pay | Admitting: Family Medicine

## 2015-02-15 VITALS — BP 140/90 | Temp 98.4°F | Ht 66.0 in | Wt 197.0 lb

## 2015-02-15 DIAGNOSIS — E785 Hyperlipidemia, unspecified: Secondary | ICD-10-CM

## 2015-02-15 DIAGNOSIS — I10 Essential (primary) hypertension: Secondary | ICD-10-CM

## 2015-02-15 DIAGNOSIS — B009 Herpesviral infection, unspecified: Secondary | ICD-10-CM

## 2015-02-15 DIAGNOSIS — K2100 Gastro-esophageal reflux disease with esophagitis, without bleeding: Secondary | ICD-10-CM

## 2015-02-15 DIAGNOSIS — Z0000 Encounter for general adult medical examination without abnormal findings: Secondary | ICD-10-CM | POA: Insufficient documentation

## 2015-02-15 MED ORDER — OMEPRAZOLE 20 MG PO CPDR: 20.0000 mg | DELAYED_RELEASE_CAPSULE | Freq: Every day | ORAL | Status: DC

## 2015-02-15 MED ORDER — HYDROCHLOROTHIAZIDE 12.5 MG PO TABS: 12.5000 mg | ORAL_TABLET | Freq: Every day | ORAL | Status: DC

## 2015-02-15 MED ORDER — SIMVASTATIN 20 MG PO TABS
20.0000 mg | ORAL_TABLET | Freq: Every day | ORAL | Status: DC
Start: 2015-02-15 — End: 2016-02-16

## 2015-02-15 MED ORDER — AMLODIPINE BESYLATE 10 MG PO TABS: ORAL_TABLET | ORAL | Status: DC

## 2015-02-15 MED ORDER — LOSARTAN POTASSIUM 100 MG PO TABS: 100.0000 mg | ORAL_TABLET | Freq: Every day | ORAL | Status: DC

## 2015-02-15 MED ORDER — ACYCLOVIR 200 MG PO CAPS: ORAL_CAPSULE | ORAL | Status: DC

## 2015-02-15 MED ORDER — FLUTICASONE PROPIONATE 50 MCG/ACT NA SUSP: 2.0000 | Freq: Every day | NASAL | Status: DC | PRN

## 2015-02-15 NOTE — Progress Notes (Signed)
Pre visit review using our clinic review tool, if applicable. No additional management support is needed unless otherwise documented below in the visit note. 

## 2015-02-15 NOTE — Progress Notes (Signed)
   Subjective:    Patient ID: Luke [name], male    DOB: 1945-12-14, 69 y.o.   MRN: [id number]  HPI Luke [name] is a 69 year old married male nonsmoker who comes in today for general physical examination because of a history of hypertension, allergic rhinitis, hyperlipidemia, erectile dysfunction  He takes acyclovir 200 mg daily to prevent HSV one  His blood pressures well controlled on Norvasc 10 mg had a core thiazide 12.5 and Cozaar 100. BP today 140/90. He's been checking his blood pressure daily at home and they're averaging less than that  He uses steroid nasal spray for allergic rhinitis when necessary, Zocor 20 mg daily for hyperlipidemia, ED is been evaluated by urology  He gets routine eye care, dental care, colonoscopy 2016 normal, vaccinations updated today.  Cognitive function normal he walks on a daily basis home health safety reviewed no issues identified, no guns in the house, he does have a healthcare power of attorney and living well.  Weight is 197 obesity class I. He's been counseled the past about diet exercise and weight loss.   Review of Systems  Constitutional: Negative.   HENT: Negative.   Eyes: Negative.   Respiratory: Negative.   Cardiovascular: Negative.   Gastrointestinal: Negative.   Endocrine: Negative.   Genitourinary: Negative.   Musculoskeletal: Negative.   Skin: Negative.   Allergic/Immunologic: Negative.   Neurological: Negative.   Hematological: Negative.   Psychiatric/Behavioral: Negative.        Objective:   Physical Exam  Constitutional: He is oriented to person, place, and time. He appears well-developed and well-nourished.  HENT:  Head: Normocephalic and atraumatic.  Right Ear: External ear normal.  Left Ear: External ear normal.  Nose: Nose normal.  Mouth/Throat: Oropharynx is clear and moist.  Eyes: Conjunctivae and EOM are normal. Pupils are equal, round, and reactive to light.  Neck: Normal range of motion. Neck supple. No  JVD present. No tracheal deviation present. No thyromegaly present.  Cardiovascular: Normal rate, regular rhythm, normal heart sounds and intact distal pulses.  Exam reveals no gallop and no friction rub.   No murmur heard. No carotid neurologic bruits peripheral pulses 1+ and symmetrical  Pulmonary/Chest: Effort normal and breath sounds normal. No stridor. No respiratory distress. He has no wheezes. He has no rales. He exhibits no tenderness.  Abdominal: Soft. Bowel sounds are normal. He exhibits no distension and no mass. There is no tenderness. There is no rebound and no guarding.  Genitourinary: Rectum normal and penis normal. Guaiac negative stool. No penile tenderness.  1+ symmetrical nonnodular BPH  Musculoskeletal: Normal range of motion. He exhibits no edema or tenderness.  Lymphadenopathy:    He has no cervical adenopathy.  Neurological: He is alert and oriented to person, place, and time. He has normal reflexes. No cranial nerve deficit. He exhibits normal muscle tone.  Skin: Skin is warm and dry. No rash noted. No erythema. No pallor.  Total body skin exam normal  Psychiatric: He has a normal mood and affect. His behavior is normal. Judgment and thought content normal.  Nursing note and vitals reviewed.         Assessment & Plan:  Hypertension at goal....... continue current therapy  Hyperlipidemia goal......... continue therapy  Obesity class I............ again as we have before stressed diet exercise and weight loss  Erectile dysfunction,,,,,,,,, followed by urology  Allergic rhinitis continue steroid nasal spray  History of HSV-1........ acyclovir when necessary or daily

## 2015-02-15 NOTE — Patient Instructions (Addendum)
Continue current medications  Follow-up in one year sooner if any problems  Call in August for your physical examination in December.............. Tommi Rumps or Almyra Free are 2 new nurse practitioners or Dr. Araceli Bouche  Walk 30 minutes daily  Carbohydrate free diet........... set a goal lose 12 pounds in the next 12 months  If the knee continues to bother-year-old recommend Dr. Joni Fears

## 2015-02-17 DIAGNOSIS — M1712 Unilateral primary osteoarthritis, left knee: Secondary | ICD-10-CM | POA: Diagnosis not present

## 2015-02-17 DIAGNOSIS — M722 Plantar fascial fibromatosis: Secondary | ICD-10-CM | POA: Diagnosis not present

## 2015-02-18 ENCOUNTER — Telehealth: Payer: Self-pay | Admitting: *Deleted

## 2015-02-18 NOTE — Telephone Encounter (Signed)
Open in error

## 2015-03-16 ENCOUNTER — Other Ambulatory Visit: Payer: Self-pay | Admitting: *Deleted

## 2015-03-16 DIAGNOSIS — I10 Essential (primary) hypertension: Secondary | ICD-10-CM

## 2015-03-16 MED ORDER — AMLODIPINE BESYLATE 10 MG PO TABS: ORAL_TABLET | ORAL | Status: DC

## 2015-03-16 NOTE — Telephone Encounter (Signed)
Rx done. 

## 2015-04-27 ENCOUNTER — Other Ambulatory Visit: Payer: Self-pay | Admitting: Family Medicine

## 2015-05-05 ENCOUNTER — Other Ambulatory Visit: Payer: Self-pay | Admitting: Family Medicine

## 2015-05-31 ENCOUNTER — Ambulatory Visit: Payer: Medicare Other | Admitting: Family Medicine

## 2015-05-31 ENCOUNTER — Encounter (HOSPITAL_COMMUNITY): Payer: Self-pay | Admitting: Emergency Medicine

## 2015-05-31 ENCOUNTER — Telehealth: Payer: Self-pay | Admitting: Family Medicine

## 2015-05-31 ENCOUNTER — Emergency Department (HOSPITAL_COMMUNITY)
Admission: EM | Admit: 2015-05-31 | Discharge: 2015-05-31 | Disposition: A | Discharge status: Home / Self Care | Payer: Medicare Other | Attending: Emergency Medicine | Admitting: Emergency Medicine

## 2015-05-31 DIAGNOSIS — Z872 Personal history of diseases of the skin and subcutaneous tissue: Secondary | ICD-10-CM | POA: Insufficient documentation

## 2015-05-31 DIAGNOSIS — M549 Dorsalgia, unspecified: Secondary | ICD-10-CM | POA: Diagnosis not present

## 2015-05-31 DIAGNOSIS — Z79899 Other long term (current) drug therapy: Secondary | ICD-10-CM | POA: Insufficient documentation

## 2015-05-31 DIAGNOSIS — Z8659 Personal history of other mental and behavioral disorders: Secondary | ICD-10-CM | POA: Insufficient documentation

## 2015-05-31 DIAGNOSIS — N529 Male erectile dysfunction, unspecified: Secondary | ICD-10-CM | POA: Diagnosis not present

## 2015-05-31 DIAGNOSIS — J45909 Unspecified asthma, uncomplicated: Secondary | ICD-10-CM | POA: Insufficient documentation

## 2015-05-31 DIAGNOSIS — M545 Low back pain, unspecified: Secondary | ICD-10-CM

## 2015-05-31 DIAGNOSIS — I10 Essential (primary) hypertension: Secondary | ICD-10-CM | POA: Diagnosis not present

## 2015-05-31 DIAGNOSIS — E785 Hyperlipidemia, unspecified: Secondary | ICD-10-CM | POA: Diagnosis not present

## 2015-05-31 DIAGNOSIS — R52 Pain, unspecified: Secondary | ICD-10-CM | POA: Diagnosis not present

## 2015-05-31 MED ORDER — DIAZEPAM 5 MG PO TABS
5.0000 mg | ORAL_TABLET | Freq: Once | ORAL | Status: AC
  Administered 2015-05-31: 5 mg via ORAL
  Filled 2015-05-31: qty 1

## 2015-05-31 MED ORDER — IBUPROFEN 800 MG PO TABS
800.0000 mg | ORAL_TABLET | Freq: Once | ORAL | Status: AC
  Administered 2015-05-31: 800 mg via ORAL
  Filled 2015-05-31: qty 1

## 2015-05-31 MED ORDER — HYDROCODONE-ACETAMINOPHEN 5-325 MG PO TABS
2.0000 | ORAL_TABLET | Freq: Once | ORAL | Status: AC
  Administered 2015-05-31: 2 via ORAL
  Filled 2015-05-31: qty 2

## 2015-05-31 MED ORDER — HYDROCODONE-ACETAMINOPHEN 5-325 MG PO TABS: 1.0000 | ORAL_TABLET | Freq: Four times a day (QID) | ORAL | Status: DC | PRN

## 2015-05-31 NOTE — Telephone Encounter (Signed)
noted 

## 2015-05-31 NOTE — Discharge Instructions (Signed)

## 2015-05-31 NOTE — Telephone Encounter (Signed)
Patient currently admitted

## 2015-05-31 NOTE — ED Notes (Signed)
Per EMS, coming from home. Saturday went to pick something up and felt a tingle in his right lower back. Over the weekend the pain increased until he's now unable to walk or move. EMS states whenever he takes the pressure off his back from lying down the pain has become unbearable.

## 2015-05-31 NOTE — ED Provider Notes (Signed)
CSN: [id number]     Arrival date & time 05/31/15  1049 History   First MD Initiated Contact with Patient 05/31/15 1055     Chief Complaint  Patient presents with  . Back Pain     (Consider location/radiation/quality/duration/timing/severity/associated sxs/prior Treatment) HPI Comments: Patient presents to the emergency department with chief complaint of low back pain. He states that he bent over to pick something up on Saturday, and felt a sharp right-sided low back pain. He reports having a "tingling sensation" on the right side of his low back. It does not radiate to his lower extremities. He states that when he got up to use the bathroom, the pain was so great that he thought he was going to pass out. He states that he has been unable to ambulate this morning because of pain. He denies any fevers chills. Denies any history of back surgery. Denies any history of cancer. States that he took an ibuprofen yesterday with some relief, and Aleve today with no relief. His symptoms are exacerbated with palpation and movement.  The history is provided by the patient. No language interpreter was used.    Past Medical History  Diagnosis Date  . Ulcer   . Depression   . Hypertension   . Hyperlipidemia   . Asthma   . ED (erectile dysfunction)    History reviewed. No pertinent past surgical history. Family History  Problem Relation Age of Onset  . Stroke Mother   . Hypertension Mother   . Peripheral vascular disease Mother   . Hyperlipidemia Father   . Coronary artery disease Father   . Peripheral vascular disease Father   . Hypertension Brother   . Coronary artery disease Brother   . Cancer Brother     throat, smoked  . Emphysema Brother     smoked  . Lung cancer Brother     smoked   Social History  Substance Use Topics  . Smoking status: Never Smoker   . Smokeless tobacco: Never Used  . Alcohol Use: 1.0 oz/week    2 drink(s) per week    Review of Systems  Constitutional:  Negative for fever and chills.  Gastrointestinal:       No bowel incontinence  Genitourinary:       No urinary incontinence  Musculoskeletal: Positive for myalgias, back pain and arthralgias.  Neurological:       No saddle anesthesia  All other systems reviewed and are negative.     Allergies  Aspirin; Irbesartan; and Symbicort  Home Medications   Prior to Admission medications   Medication Sig Start Date End Date Taking? Authorizing Provider  acyclovir (ZOVIRAX) 200 MG capsule TAKE 1 CAPSULE DAILY 02/15/15   Dorena Cookey, MD  amLODipine (NORVASC) 10 MG tablet TAKE 1 TABLET DAILY 03/16/15   Dorena Cookey, MD  fluticasone [facility]) 50 MCG/ACT nasal spray Place 2 sprays into both nostrils daily as needed for allergies or rhinitis. 02/15/15   Dorena Cookey, MD  hydrochlorothiazide (HYDRODIURIL) 12.5 MG tablet Take 1 tablet (12.5 mg total) by mouth daily. 02/15/15   Dorena Cookey, MD  losartan (COZAAR) 100 MG tablet Take 1 tablet (100 mg total) by mouth daily. 02/15/15   Dorena Cookey, MD  losartan (COZAAR) 100 MG tablet TAKE 1 TABLET DAILY 04/27/15   Dorena Cookey, MD  omeprazole (PRILOSEC) 20 MG capsule Take 1 capsule (20 mg total) by mouth daily. 02/15/15   Dorena Cookey, MD  omeprazole (Pharr) 20  MG capsule TAKE 1 CAPSULE DAILY 05/06/15   Dorena Cookey, MD  sildenafil (REVATIO) 20 MG tablet Take 1 tablet (20 mg total) by mouth 3 (three) times daily. 11/23/13   Dorena Cookey, MD  simvastatin (ZOCOR) 20 MG tablet Take 1 tablet (20 mg total) by mouth at bedtime. 02/15/15   Dorena Cookey, MD   BP 134/86 mmHg  Pulse 78  Temp(Src) 98.7 F (37.1 C) (Oral)  Resp 16  SpO2 100% Physical Exam  Constitutional: He is oriented to person, place, and time. He appears well-developed and well-nourished. No distress.  HENT:  Head: Normocephalic and atraumatic.  Eyes: Conjunctivae and EOM are normal. Right eye exhibits no discharge. Left eye exhibits no discharge. No scleral icterus.   Neck: Normal range of motion. Neck supple. No tracheal deviation present.  Cardiovascular: Normal rate, regular rhythm and normal heart sounds.  Exam reveals no gallop and no friction rub.   No murmur heard. Pulmonary/Chest: Effort normal and breath sounds normal. No respiratory distress. He has no wheezes.  Abdominal: Soft. He exhibits no distension. There is no tenderness.  Musculoskeletal: Normal range of motion.  Right lumbar paraspinal muscles tender to palpation, no bony tenderness, step-offs, or gross abnormality or deformity of spine, patient is able to ambulate, moves all extremities  Bilateral great toe extension intact Bilateral plantar/dorsiflexion intact  Neurological: He is alert and oriented to person, place, and time. He has normal reflexes.  Sensation and strength intact bilaterally Symmetrical reflexes  Skin: Skin is warm. He is not diaphoretic.  Psychiatric: He has a normal mood and affect. His behavior is normal. Judgment and thought content normal.  Nursing note and vitals reviewed.   ED Course  Procedures (including critical care time)   MDM   Final diagnoses:  Right-sided low back pain without sciatica    Patient with back pain. Likely muscle spasm.  No neurological deficits and normal neuro exam.  Patient feels much better after treatment.  Patient is ambulatory.  No loss of bowel or bladder control.  Doubt cauda equina.  Denies fever,  doubt epidural abscess or other lesion. Recommend back exercises, stretching, RICE, and will treat with a short course of norco.      Montine Circle, PA-C 05/31/15 1251  Dorie Rank, MD 05/31/15 1316

## 2015-05-31 NOTE — Telephone Encounter (Signed)
Patient Name: [name]  DOB: [birth year]    Initial Comment Caller states her husband has been having troubles with the right side on his back towards his hip- this morning he was not able to get out of bed- has an appt this afternoon   Nurse Assessment  Nurse: Mallie Mussel, RN, Alveta Heimlich Date/Time Eilene Ghazi Time): 05/31/2015 9:52:44 AM  Confirm and document reason for call. If symptomatic, describe symptoms. You must click the next button to save text entered. ---Caller states that her husband cannot stand and bear any weight at all this morning. He has had back issues from time to time, but never like this. The pain he has this morning is on the right side of his back about 2-3" off the spine. He rates his pain as 9 on 0-10 scale. He was able to get up once during the night to go to the bathroom but he felt he was about to pass out. He only has 1 kidney, but he does not know which side it is on. He was born with it. Denies blood in the urine, denies vomiting. and denies burning/pain with urination. Denies fever.  Has the patient traveled out of the country within the last 30 days? ---No  Does the patient have any new or worsening symptoms? ---Yes  Will a triage be completed? ---Yes  Related visit to physician within the last 2 weeks? ---No  Does the PT have any chronic conditions? (i.e. diabetes, asthma, etc.) ---Yes  List chronic conditions. ---Asthma  Is this a behavioral health or substance abuse call? ---No     Guidelines    Guideline Title Affirmed Question Affirmed Notes  Flank Pain [1] SEVERE pain (e.g., excruciating, scale 8-10) AND [2] present > 1 hour    Final Disposition User   Go to ED Now Mallie Mussel, RN, Alveta Heimlich    Comments  I advised her that if he is not able to walk at all to where she can get him into a car, will need to call 911. She verbalized understanding.   Referrals  [facility] - ED   Disagree/Comply: Comply

## 2015-05-31 NOTE — ED Notes (Signed)
Pt up out of bed, standing, holding on to table. Pt states he feels as if he is having spasms.

## 2015-06-03 ENCOUNTER — Encounter: Payer: Self-pay | Admitting: Family Medicine

## 2015-06-03 ENCOUNTER — Ambulatory Visit (INDEPENDENT_AMBULATORY_CARE_PROVIDER_SITE_OTHER): Payer: Medicare Other | Admitting: Family Medicine

## 2015-06-03 VITALS — BP 148/88 | HR 74 | Temp 98.4°F | Wt 195.0 lb

## 2015-06-03 DIAGNOSIS — M545 Low back pain, unspecified: Secondary | ICD-10-CM

## 2015-06-03 DIAGNOSIS — I10 Essential (primary) hypertension: Secondary | ICD-10-CM | POA: Diagnosis not present

## 2015-06-03 NOTE — Assessment & Plan Note (Signed)
S: controlled per [facility] on Amlodipine 10mg , losartan 100mg  but close to both #s. He did not tolerat the hctz- reported cramps at night BP Readings from Last 3 Encounters:  06/03/15 148/88  05/31/15 134/86  02/15/15 140/90  A/P:Continue current meds:  We discussed following up in 2 months- focus on dash, increasing exercise, hopefully weight loss. May be forced to trial beta blocker such as bystolic.

## 2015-06-03 NOTE — Progress Notes (Signed)
Garret Reddish, MD  Subjective:  Luke [name] is a 70 y.o. year old very pleasant male patient who presents for/with See problem oriented charting ROS- No chest pain or shortness of breath. No headache or blurry vision. No leg weakness. No fecal or urinary incontinence. No pain into leg. No saddle anesthesia.   Past Medical History-  Patient Active Problem List   Diagnosis Date Noted  . DEGENERATIVE DISC DISEASE, CERVICAL SPINE, W/RADICULOPATHY 07/28/2008    Priority: Medium  . Essential hypertension 07/08/2007    Priority: Medium  . Hyperlipidemia 05/02/2007    Priority: Medium  . GERD (gastroesophageal reflux disease) 05/02/2007    Priority: Medium  . Left knee pain 02/01/2015    Priority: Low  . Restless leg syndrome 03/30/2013    Priority: Low  . Allergic rhinitis 10/31/2009    Priority: Low  . Headache 10/31/2009    Priority: Low  . ERECTILE DYSFUNCTION, MILD 05/02/2007    Priority: Low  . PVC (premature ventricular contraction) 05/02/2007    Priority: Low    Medications- reviewed and updated Current Outpatient Prescriptions  Medication Sig Dispense Refill  . acyclovir (ZOVIRAX) 200 MG capsule TAKE 1 CAPSULE DAILY 90 capsule 3  . amLODipine (NORVASC) 10 MG tablet TAKE 1 TABLET DAILY 90 tablet 1  . Famotidine (PEPCID AC PO) Take 1 tablet by mouth daily as needed (acid reflux).    . fluticasone (FLONASE) 50 MCG/ACT nasal spray Place 2 sprays into both nostrils daily as needed for allergies or rhinitis. 32 g 6  . losartan (COZAAR) 100 MG tablet Take 1 tablet (100 mg total) by mouth daily. 90 tablet 3  . naproxen sodium (ANAPROX) 220 MG tablet Take 440 mg by mouth 2 (two) times daily as needed (pain).    Marland Kitchen omeprazole (PRILOSEC) 20 MG capsule Take 1 capsule (20 mg total) by mouth daily. (Patient taking differently: Take 20 mg by mouth every other day. ) 90 capsule 3  . simvastatin (ZOCOR) 20 MG tablet Take 1 tablet (20 mg total) by mouth at bedtime. (Patient taking  differently: Take 20 mg by mouth every morning. ) 90 tablet 3  . HYDROcodone-acetaminophen (NORCO/VICODIN) 5-325 MG tablet Take 1-2 tablets by mouth every 6 (six) hours as needed. (Patient not taking: Reported on 06/03/2015) 10 tablet 0     Objective: BP 148/88 mmHg  Pulse 74  Temp(Src) 98.4 F (36.9 C)  Wt 195 lb (88.451 kg) Gen: NAD, resting comfortably CV: RRR no murmurs rubs or gallops Lungs: CTAB no crackles, wheeze, rhonchi Abdomen: soft/nontender/nondistended/normal bowel sounds. No rebound or guarding.  Ext: no edema Skin: warm, dry Back - Normal skin, Spine with normal alignment and no deformity.  No tenderness to vertebral process palpation.  Paraspinous muscles mildly tender and with spasm to the right, normal to left.    Range of motion is full at neck and lumbar sacral regions. Negative Straight leg raise.  Neuro- no saddle anesthesia, 5/5 strength lower extremities, 2+ reflexes  Assessment/Plan:  Low Back spasm S: Patient states a week ago he noted some tightness in his right low back. Seemed to worsen and by monday of this past week his pain was so severe that he had a hard time getting off toilet. Next morning he could not get out of bed and had to call EMS. 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 Admits  to weak core. In ED, he was given pain medication and discharged with #10 of norco of which he still has 5 left. Pain at rest was 10/10 bu tnow down to 0/10 but can still responde to movement.  A/P:Patient with weak core as predisposing factor- refer to PT at this time. Hopeful he can increase activity level both to strengthen back but also to lose weight and help with blood pressure  Essential hypertension S: controlled per [facility] on Amlodipine 10mg , losartan 100mg  but close to both #s. He did not tolerat the hctz- reported cramps at night BP Readings from Last 3  Encounters:  06/03/15 148/88  05/31/15 134/86  02/15/15 140/90  A/P:Continue current meds:  We discussed following up in 2 months- focus on dash, increasing exercise, hopefully weight loss. May be forced to trial beta blocker such as bystolic.    2 month BP recheck.  Return precautions advised.   Orders Placed This Encounter  Procedures  . Ambulatory referral to Physical Therapy    Referral Priority:  Routine    Referral Type:  Physical Medicine    Referral Reason:  Specialty Services Required    Requested Specialty:  Physical Therapy    Number of Visits Requested:  1

## 2015-06-03 NOTE — Patient Instructions (Addendum)
Blood pressure is running higher than I would like. Ideally like you <140/90 BP Readings from Last 3 Encounters:  06/03/15 148/88  05/31/15 134/86  02/15/15 140/90   Wt Readings from Last 3 Encounters:  06/03/15 195 lb (88.451 kg)  02/15/15 197 lb (89.359 kg)  02/01/15 198 lb (89.812 kg)  You have lost a few lbs over last few months which is great- would continue efforts. Dash eating plan can help lower blood pressure without medication (see below)  Let's follow up in 2 months May 30th at 9:15. We will follow up on blood pressure, back pain, try to review history (may be able to cancel your January appointment)  We will call you within a week about your referral to PT. If you do not hear within 2 weeks, give [place] a call. If pain goes in opposite direction, more than happy to see you back  DASH Eating Plan DASH stands for "Dietary Approaches to Stop Hypertension." The DASH eating plan is a healthy eating plan that has been shown to reduce high blood pressure (hypertension). Additional health benefits may include reducing the risk of type 2 diabetes mellitus, heart disease, and stroke. The DASH eating plan may also help with weight loss. WHAT DO I NEED TO KNOW ABOUT THE DASH EATING PLAN? For the DASH eating plan, you will follow these general guidelines:  Choose foods with a percent daily value for sodium of less than 5% (as listed on the food label).  Use salt-free seasonings or herbs instead of table salt or sea salt.  Check with your health care provider or pharmacist before using salt substitutes.  Eat lower-sodium products, often labeled as "lower sodium" or "no salt added."  Eat fresh foods.  Eat more vegetables, fruits, and low-fat dairy products.  Choose whole grains. Look for the word "whole" as the first word in the ingredient list.  Choose fish and skinless chicken or [place] more often than red meat. Limit fish, poultry, and meat to 6 oz (170 g) each day.  Limit sweets,  desserts, sugars, and sugary drinks.  Choose heart-healthy fats.  Limit cheese to 1 oz (28 g) per day.  Eat more home-cooked food and less restaurant, buffet, and fast food.  Limit fried foods.  Cook foods using methods other than frying.  Limit canned vegetables. If you do use them, rinse them well to decrease the sodium.  When eating at a restaurant, ask that your food be prepared with less salt, or no salt if possible. WHAT FOODS CAN I EAT? Seek help from a dietitian for individual calorie needs. Grains Whole grain or whole wheat bread. Brown rice. Whole grain or whole wheat pasta. Quinoa, bulgur, and whole grain cereals. Low-sodium cereals. Corn or whole wheat flour tortillas. Whole grain cornbread. Whole grain crackers. Low-sodium crackers. Vegetables Fresh or frozen vegetables (raw, steamed, roasted, or grilled). Low-sodium or reduced-sodium tomato and vegetable juices. Low-sodium or reduced-sodium tomato sauce and paste. Low-sodium or reduced-sodium canned vegetables.  Fruits All fresh, canned (in natural juice), or frozen fruits. Meat and Other Protein Products Ground beef (85% or leaner), grass-fed beef, or beef trimmed of fat. Skinless chicken or [place]. Ground chicken or [place]. Pork trimmed of fat. All fish and seafood. Eggs. Dried beans, peas, or lentils. Unsalted nuts and seeds. Unsalted canned beans. Dairy Low-fat dairy products, such as skim or 1% milk, 2% or reduced-fat cheeses, low-fat ricotta or cottage cheese, or plain low-fat yogurt. Low-sodium or reduced-sodium cheeses. Fats and Oils Tub margarines without  trans fats. Light or reduced-fat mayonnaise and salad dressings (reduced sodium). Avocado. Safflower, olive, or canola oils. Natural peanut or almond butter. Other Unsalted popcorn and pretzels. The items listed above may not be a complete list of recommended foods or beverages. Contact your dietitian for more options. WHAT FOODS ARE NOT  RECOMMENDED? Grains White bread. White pasta. White rice. Refined cornbread. Bagels and croissants. Crackers that contain trans fat. Vegetables Creamed or fried vegetables. Vegetables in a cheese sauce. Regular canned vegetables. Regular canned tomato sauce and paste. Regular tomato and vegetable juices. Fruits Dried fruits. Canned fruit in light or heavy syrup. Fruit juice. Meat and Other Protein Products Fatty cuts of meat. Ribs, chicken wings, bacon, sausage, bologna, salami, chitterlings, fatback, hot dogs, bratwurst, and packaged luncheon meats. Salted nuts and seeds. Canned beans with salt. Dairy Whole or 2% milk, cream, half-and-half, and cream cheese. Whole-fat or sweetened yogurt. Full-fat cheeses or blue cheese. Nondairy creamers and whipped toppings. Processed cheese, cheese spreads, or cheese curds. Condiments Onion and garlic salt, seasoned salt, table salt, and sea salt. Canned and packaged gravies. Worcestershire sauce. Tartar sauce. Barbecue sauce. Teriyaki sauce. Soy sauce, including reduced sodium. Steak sauce. Fish sauce. Oyster sauce. Cocktail sauce. Horseradish. Ketchup and mustard. Meat flavorings and tenderizers. Bouillon cubes. Hot sauce. Tabasco sauce. Marinades. Taco seasonings. Relishes. Fats and Oils Butter, stick margarine, lard, shortening, ghee, and bacon fat. Coconut, palm kernel, or palm oils. Regular salad dressings. Other Pickles and olives. Salted popcorn and pretzels. The items listed above may not be a complete list of foods and beverages to avoid. Contact your dietitian for more information. WHERE CAN I FIND MORE INFORMATION? National Heart, Lung, and Blood Institute: travelstabloid.com   This information is not intended to replace advice given to you by your health care provider. Make sure you discuss any questions you have with your health care provider.   Document Released: 02/08/2011 Document Revised: 03/12/2014  Document Reviewed: 12/24/2012 Elsevier Interactive Patient Education [employer].

## 2015-06-09 ENCOUNTER — Ambulatory Visit: Payer: Medicare Other | Attending: Family Medicine | Admitting: Physical Therapy

## 2015-06-09 DIAGNOSIS — M545 Low back pain, unspecified: Secondary | ICD-10-CM

## 2015-06-09 DIAGNOSIS — M6281 Muscle weakness (generalized): Secondary | ICD-10-CM | POA: Diagnosis not present

## 2015-06-09 NOTE — Patient Instructions (Signed)
   Limit sitting. Use lumbar roll.   Limit forward forward bending.     Ruben Im PT [facility] [address] [place], [place] [address] Phone # [phone] Fax [phone]

## 2015-06-09 NOTE — Therapy (Signed)
[facility] [address] [place], [place], [address] Phone: [phone]   Fax:  [phone]  Physical Therapy Evaluation  Patient Details  Name: Luke [name] MRN: [id number] Date of Birth: 1945-04-09 Referring Provider: Dr. Yong Channel  Encounter Date: 06/09/2015      PT End of Session - 06/09/15 1850    Visit Number 1   Number of Visits 10   Date for PT Re-Evaluation 08/04/15   Authorization Type Medicare G codes;  Kx at 15   PT Start Time 1145   PT Stop Time 1230   PT Time Calculation (min) 45 min   Activity Tolerance Patient tolerated treatment well      Past Medical History  Diagnosis Date  . Ulcer   . Depression   . Hypertension   . Hyperlipidemia   . Asthma   . ED (erectile dysfunction)   . Solitary pulmonary nodule 04/16/2013    03/30/13  CXR Question left nipple shadow.  10 mm ovoid nodular density right upper lobe, cannot exclude  pulmonary mass/ nodule > f/u cxr 04/29/2013 > no nodule      No past surgical history on file.  There were no vitals filed for this visit.       Subjective Assessment - 06/09/15 1149    Subjective 2 weeks ago, shag dancing doing a move that torques the back, 2 days later back started bothering.  Right LBP;   Progressively worsened, bent over left and forward.  Hurt to get in/out of chair, rolling in bed, coughing.  Last Tuesday, get up to go the bathroom and was in severe pain and couldn't get out of bed.  Needed son to help him but couldn't do it.  Had to call EMS.   In ED had hydrocodone and Vicodin.  Worst ever episode.  Took 1/2 hour to get up steps into house.     Limitations House hold activities;Standing;Walking;Lifting;Sitting   Diagnostic tests No x-rays or MRI   Patient Stated Goals reassure that I won't have a repeat of this   Currently in Pain? Yes   Pain Score 1   tiredness in pain    Pain Location Back   Pain Orientation Right   Pain Type Acute pain   Pain Onset 1 to 4 weeks ago   Pain Frequency Intermittent   Aggravating Factors  lifting weighted object, stretching   Pain Relieving Factors walking on treadmill;              [facility] PT Assessment - 06/09/15 0001    Assessment   Medical Diagnosis Right sided LBP no sciatica   Referring Provider Dr. Yong Channel   Onset Date/Surgical Date 05/25/15   Hand Dominance Right   Next MD Visit 3rd week of May   Prior Therapy 1-2 years ago OA in neck   Precautions   Precautions None   Restrictions   Weight Bearing Restrictions No   Balance Screen   Has the patient fallen in the past 6 months No   Has the patient had a decrease in activity level because of a fear of falling?  No   Is the patient reluctant to leave their home because of a fear of falling?  No   Home Environment   Living Environment Private residence   Living Arrangements Spouse/significant other   Type of [place] Access Stairs to enter   Home Layout Two level   Alternate Level Stairs-Number of Steps 12  Prior Function   Level of Independence Independent   Vocation Retired   Leisure shag dance competitions; travel    Observation/Other Assessments   Focus on Therapeutic Outcomes (FOTO)  39% limitationg   Posture/Postural Control   Posture/Postural Control Postural limitations   Postural Limitations Weight shift left;Decreased lumbar lordosis   ROM / Strength   AROM / PROM / Strength AROM;Strength   AROM   AROM Assessment Site Lumbar   Lumbar Flexion 55   Lumbar Extension 10   Lumbar - Right Side Bend 24   Lumbar - Left Side Bend 25   Strength   Overall Strength --  Grossly 4/5   Overall Strength Comments Decreased activation of transverse abdominals and lumbar multifidi   Flexibility   Soft Tissue Assessment /Muscle Length yes   Hamstrings 70 B   Quadriceps Bilateral   Palpation   Palpation comment No tenderness in paraspinals or gluteals today   Special Tests    Special Tests Lumbar   Lumbar  Tests Slump Test;Prone Knee Bend Test;Straight Leg Raise   Slump test   Findings Negative   Prone Knee Bend Test   Findings Negative   Straight Leg Raise   Findings Negative                           PT Education - 06/09/15 1850    Education provided Yes   Education Details Prone press ups or standing extension;  sitting posture correction;  flexion avoidance   Person(s) Educated Patient   Methods Explanation;Demonstration;Handout   Comprehension Verbalized understanding;Returned demonstration          PT Short Term Goals - 06/09/15 1903    PT SHORT TERM GOAL #1   Title The patient will have basic understanding of postural correction and body mechanics to promote healing  07/07/15   Time 4   Period Weeks   Status New   PT SHORT TERM GOAL #2   Title The patient will be able to walk 20 min with min increase in pain   Time 4   Period Weeks   Status New   PT SHORT TERM GOAL #3   Title The patient will have improved lumbar flexion to 60 degrees, extension to 15 degrees, right and left sidebending improved to 30 degrees needed for return to dancing   Time 4   Period Weeks   Status New   PT SHORT TERM GOAL #4   Title Pain with sitting and standing improved by 25%    Time 4   Period Weeks   Status New           PT Long Term Goals - 06/09/15 1908    PT LONG TERM GOAL #1   Title The patient will be independent in safe self progression of HEP   08/04/2015   Time 8   Period Weeks   Status New   PT LONG TERM GOAL #2   Title The patient will have improved HS, psoas, QL muscle lengths (symmetrical) needed for shag dancing   Time 8   Period Weeks   Status New   PT LONG TERM GOAL #3   Title The patient will have improved core/trunk strength to 4+/5 needed for lifting moderate weight objects (lifting a suitcase).     Time 8   Period Weeks   Status New   PT LONG TERM GOAL #4   Title The patient will be able to to walk  30 min with minimal to no increase in  pain   Time 8   Period Weeks   Status New   PT LONG TERM GOAL #5   Title The patient will report a 60% decrease in pain with usual ADLs   Time 8   Period Weeks   Status New   Additional Long Term Goals   Additional Long Term Goals Yes   PT LONG TERM GOAL #6   Title FOTO functional outcome score improved from 39% to 27% indicating improved function with less pain   Time 8   Period Weeks   Status New               Plan - 06/09/15 1851    Clinical Impression Statement The patient is of low complexity.  He had an acute onset of right LBP 2 weeks ago which he attributes to a specific move in [place] dancing which involves spinal rotation.  The pain was so severe he was unable to get out of bed and had to be transported to the ED via EMS.  He was unable to stand up straight and reports being shifted to the left.  He reports feeling much better but with some pain with lifting or stretching too far although mostly he is too fearful of all mobility.  Today he has a mild left shift and decreased lumbar lordosis.  Lumbar AROM;  limited in all planes flex 55, ext 10, right /left sidebend 25 degrees.  Decreased HS, QL and psoas muscle lengths right > left.   No worsening or production of symptoms with repeated movement testing.  Decreased core strength grossly 4/5 with decreased activation of transverse abdominals and lumbar multifidi.  No tenderness in spinal musculature today.  He would benefit from PT to return to his previous active lifestyle competitive shag dancing and traveling.     Rehab Potential Good   PT Frequency 2x / week   PT Duration 8 weeks   PT Treatment/Interventions ADLs/Self Care Home Management;Cryotherapy;Electrical Stimulation;Moist Heat;Therapeutic exercise;Ultrasound;Traction;Patient/family education;Manual techniques;Taping;Dry needling   PT Next Visit Plan assess response to trial of lumbar extension;  discuss basic body mechanics with ADLs to promote further healing;   abdominal bracing;  gentle lumbar mobility ex;   modalities for pain control as needed      Patient will benefit from skilled therapeutic intervention in order to improve the following deficits and impairments:  Pain, Decreased activity tolerance, Decreased range of motion, Decreased strength  Visit Diagnosis: Right-sided low back pain without sciatica - Plan: PT plan of care cert/re-cert  Muscle weakness (generalized) - Plan: PT plan of care cert/re-cert      G-Codes - [id number]    Functional Assessment Tool Used FOTO; clinical judgement    Functional Limitation Mobility: Walking and moving around   Mobility: Walking and Moving Around Current Status [phone]) At least 20 percent but less than 40 percent impaired, limited or restricted   Mobility: Walking and Moving Around Goal Status [id number]) At least 20 percent but less than 40 percent impaired, limited or restricted       Problem List Patient Active Problem List   Diagnosis Date Noted  . Left knee pain 02/01/2015  . Restless leg syndrome 03/30/2013  . Allergic rhinitis 10/31/2009  . Headache 10/31/2009  . DEGENERATIVE DISC DISEASE, CERVICAL SPINE, W/RADICULOPATHY 07/28/2008  . Essential hypertension 07/08/2007  . Hyperlipidemia 05/02/2007  . ERECTILE DYSFUNCTION, MILD 05/02/2007  . PVC (premature ventricular contraction) 05/02/2007  . GERD (gastroesophageal  reflux disease) 05/02/2007    Alvera Singh 06/09/2015, 7:37 PM  [facility] [address] [place], [place], [address] Phone: [phone]   Fax:  [phone]  Name: Luke [name] MRN: [id number] Date of Birth: [birth year]   Ruben Im, PT 06/09/2015 7:37 PM Phone: [phone] Fax: [phone]

## 2015-06-12 ENCOUNTER — Other Ambulatory Visit: Payer: Self-pay | Admitting: Family Medicine

## 2015-06-16 ENCOUNTER — Encounter: Payer: Self-pay | Admitting: Physical Therapy

## 2015-06-16 ENCOUNTER — Ambulatory Visit: Payer: Medicare Other | Admitting: Physical Therapy

## 2015-06-16 DIAGNOSIS — M545 Low back pain, unspecified: Secondary | ICD-10-CM

## 2015-06-16 DIAGNOSIS — M6281 Muscle weakness (generalized): Secondary | ICD-10-CM

## 2015-06-16 NOTE — Therapy (Addendum)
[facility] [address], [place], [place], [address] Phone: [phone]   Fax:  [phone]  Physical Therapy Treatment/Discharge Summary  Patient Details  Name: Luke [name] MRN: [id number] Date of Birth: May 21, 1945 Referring Provider: Dr. Yong Channel  Encounter Date: 06/16/2015      PT End of Session - 06/16/15 1119    Visit Number 2   Number of Visits 10   Date for PT Re-Evaluation 08/04/15   Authorization Type Medicare G codes;  Kx at 15   PT Start Time 1100   PT Stop Time 1202   PT Time Calculation (min) 62 min   Activity Tolerance Patient tolerated treatment well      Past Medical History  Diagnosis Date  . Ulcer   . Depression   . Hypertension   . Hyperlipidemia   . Asthma   . ED (erectile dysfunction)   . Solitary pulmonary nodule 04/16/2013    03/30/13  CXR Question left nipple shadow.  10 mm ovoid nodular density right upper lobe, cannot exclude  pulmonary mass/ nodule > f/u cxr 04/29/2013 > no nodule      History reviewed. No pertinent past surgical history.  There were no vitals filed for this visit.      Subjective Assessment - 06/16/15 1110    Subjective Pt reports intermittend pain in low back on Rt side rated as 2-3/10. Pt walking with guarded motion.    Pertinent History end of March sudden onset of low back pain, 2 day's after shag dancing. Right LBP. difficult to negotiate stairs   Limitations House hold activities;Standing;Walking;Lifting;Sitting   Diagnostic tests No x-rays or MRI   Patient Stated Goals reassure that I won't have a repeat of this   Currently in Pain? Yes   Pain Score 2    Pain Location Back   Pain Orientation Right   Pain Descriptors / Indicators Aching;Sore   Pain Type Acute pain   Pain Onset 1 to 4 weeks ago   Pain Frequency Intermittent   Aggravating Factors  lifting weighted object, stretching   Pain Relieving Factors walking on treadmill   Multiple Pain  Sites No                         [facility] Adult PT Treatment/Exercise - 06/16/15 0001    Bed Mobility   Bed Mobility --  Educated on proper bodymechnaics and lifting   Exercises   Exercises Lumbar   Lumbar Exercises: Stretches   Active Hamstring Stretch 4 reps;20 seconds  each leg in sitting   Lumbar Exercises: Standing   Other Standing Lumbar Exercises McKenzie extension 2 x 10 in standing   Lumbar Exercises: Prone   Other Prone Lumbar Exercises [place] push ups 2x10   Other Prone Lumbar Exercises Lat shift to Rt 2 x10   Modalities   Modalities Electrical Stimulation;Moist Heat   Moist Heat Therapy   Number Minutes Moist Heat 20 Minutes   Moist Heat Location Lumbar Spine   Electrical Stimulation   Electrical Stimulation Location low mid back   Electrical Stimulation Action IFC   Electrical Stimulation Parameters 73m   Electrical Stimulation Goals Pain                  PT Short Term Goals - 06/16/15 1132    PT SHORT TERM GOAL #1   Title The patient will have basic understanding of postural correction and body mechanics to promote  healing  07/07/15   Time 4   Period Weeks   Status On-going   PT SHORT TERM GOAL #2   Title The patient will be able to walk 20 min with min increase in pain   Time 4   Period Weeks   Status On-going   PT SHORT TERM GOAL #3   Title The patient will have improved lumbar flexion to 60 degrees, extension to 15 degrees, right and left sidebending improved to 30 degrees needed for return to dancing   Time 4   Period Weeks   Status On-going   PT SHORT TERM GOAL #4   Title Pain with sitting and standing improved by 25%   40%   Time 4   Period Weeks   Status On-going           PT Long Term Goals - 06/09/15 1908    PT LONG TERM GOAL #1   Title The patient will be independent in safe self progression of HEP   08/04/2015   Time 8   Period Weeks   Status New   PT LONG TERM GOAL #2   Title The patient will have  improved HS, psoas, QL muscle lengths (symmetrical) needed for shag dancing   Time 8   Period Weeks   Status New   PT LONG TERM GOAL #3   Title The patient will have improved core/trunk strength to 4+/5 needed for lifting moderate weight objects (lifting a suitcase).     Time 8   Period Weeks   Status New   PT LONG TERM GOAL #4   Title The patient will be able to to walk 30 min with minimal to no increase in pain   Time 8   Period Weeks   Status New   PT LONG TERM GOAL #5   Title The patient will report a 60% decrease in pain with usual ADLs   Time 8   Period Weeks   Status New   Additional Long Term Goals   Additional Long Term Goals Yes   PT LONG TERM GOAL #6   Title FOTO functional outcome score improved from 39% to 27% indicating improved function with less pain   Time 8   Period Weeks   Status New               Plan - 06/16/15 1124    Clinical Impression Statement Pt with pelvic hift to left, guarded posture and walking, however, was able to negotiate stairs without pain. Pt will benefit from skilled PT to address posture, flexibility and strength to be able to return to his previous acitve lifestylle   Rehab Potential Good   PT Frequency 2x / week   PT Duration 8 weeks   PT Treatment/Interventions ADLs/Self Care Home Management;Cryotherapy;Electrical Stimulation;Moist Heat;Therapeutic exercise;Ultrasound;Traction;Patient/family education;Manual techniques;Taping;Dry needling   PT Next Visit Plan Continue with McKenzie extension, review bodymechanics and HS stretch. promote further healing;  abdominal bracing;  gentle lumbar mobility ex;   asses benefits from e-stim and heat   Consulted and Agree with Plan of Care Patient      Patient will benefit from skilled therapeutic intervention in order to improve the following deficits and impairments:  Pain, Decreased activity tolerance, Decreased range of motion, Decreased strength  Visit Diagnosis: Right-sided low  back pain without sciatica  Muscle weakness (generalized)    PHYSICAL THERAPY DISCHARGE SUMMARY  Visits from Start of Care: 2  Current functional level related to goals / functional outcomes:  The patient called to request discharge from PT stating he was "feeling better."  Unable to assess completion of goals.     Remaining deficits: As above   Education / Equipment: Basic HEP and self care, postural correction and body mechanics Plan: Patient agrees to discharge.  Patient goals were not met. Patient is being discharged due to the patient's request.  ?????       G codes:  Mobility/moving around  Goal CJ, Discharge CJ    Problem List Patient Active Problem List   Diagnosis Date Noted  . Left knee pain 02/01/2015  . Restless leg syndrome 03/30/2013  . Allergic rhinitis 10/31/2009  . Headache 10/31/2009  . DEGENERATIVE DISC DISEASE, CERVICAL SPINE, W/RADICULOPATHY 07/28/2008  . Essential hypertension 07/08/2007  . Hyperlipidemia 05/02/2007  . ERECTILE DYSFUNCTION, MILD 05/02/2007  . PVC (premature ventricular contraction) 05/02/2007  . GERD (gastroesophageal reflux disease) 05/02/2007   Ruben Im, PT 07/08/2015 7:44 AM Phone: [phone] Fax: [phone]  NAUMANN-HOUEGNIFIO,Alayssa Flinchum PTA 06/16/2015, 2:03 PM  [facility] [address], [place], [place], [address] Phone: [phone]   Fax:  [phone]  Name: Luke [name] MRN: [id number] Date of Birth: [birth year]

## 2015-06-16 NOTE — Patient Instructions (Signed)
Lifting Principles  .Maintain proper posture and head alignment. .Slide object as close as possible before lifting. .Move obstacles out of the way. .Test before lifting; ask for help if too heavy. .Tighten stomach muscles without holding breath. .Use smooth movements; do not jerk. .Use legs to do the work, and pivot with feet. .Distribute the work load symmetrically and close to the center of trunk. .Push instead of pull whenever possible.   Squat down and hold basket close to stand. Use leg muscles to do the work.    Avoid twisting or bending back. Pivot around using foot movements, and bend at knees if needed when reaching for articles.        Getting Into / Out of Bed   Lower self to lie down on one side by raising legs and lowering head at the same time. Use arms to assist moving without twisting. Bend both knees to roll onto back if desired. To sit up, start from lying on side, and use same move-ments in reverse. Keep trunk aligned with legs.    Shift weight from front foot to back foot as item is lifted off shelf.     Golferlift    When leaning forward to pick object up from floor, extend one leg out behind. Keep back straight. Hold onto a sturdy support with other hand.      Sit upright, head facing forward. Try using a roll to support lower back. Keep shoulders relaxed, and avoid rounded back. Keep hips level with knees. Avoid crossing legs for long periods.    Leg Extension (Hamstring)     All stretches are performed at least x 3 with 20 sec hold, repeat 2-3 x a day   Sit toward front edge of chair, with leg out straight, heel on floor, toes pointing toward body.  Keeping back straight, bend forward at hip, HOLD for 20 seconds  Return, Repeat _3__ times. Repeat with other leg. Do _2-3__ sessions per day. Variation: Perform from standing position, with support.  Hamstring Step 1   Straighten left knee. Keep knee level with other knee or on  bolster. Hold ___ seconds. Relax knee by returning foot to start. Repeat ___ times.  Hamstring Stretch   With other leg bent, foot flat, grasp right leg and slowly try to straighten knee. Hold ____ seconds. Repeat ____ times. Do ____ sessions per day.  http://gt2.exer.us/279   Hamstring Stretch (Standing)   Standing, place one heel on chair or bench. Use one or both hands on thigh for support. Keeping torso straight, lean forward slowly until a stretch is felt in back of same thigh. Hold ____ seconds. Repeat with other leg.  Hamstring Step 2   Left foot relaxed, knee straight, other leg bent, foot flat. Raise straight leg further upward to maximal range. Hold ___ seconds. Relax leg completely down. Repeat ___ times.  Hamstring Step 3   Left leg in maximal straight leg raise, heel at maximal stretch, straighten knee further by tightening knee cap. Warning: Intense stretch. Stay within tolerance. Hold ___ seconds. Relax knee cap only. Repeat ___ times.  Hamstring Step 4   Left leg and foot in maximal stretch. Slowly lengthen and press other leg down as close to floor as possible. Keep lower abdominals tight. Warning: Intense stretch. Stay within tolerance. Hold ___ seconds. Relax lengthened leg slightly. Do not re-bend knee. Repeat press and lengthen ___ times.  Hamstring Stretch, Reclined (Strap, Doorframe)   Lengthen bottom leg on floor. Extend top  leg along edge of doorframe or press foot up into yoga strap. Hold for ____ breaths. Repeat ____ times each leg.  Stretching: Hamstring (Sitting)   With right leg straight, tuck other foot near groin. Reach down until stretch is felt in back of thigh. Keep back straight. Hold ____ seconds. Repeat ____ times per set. Do ____ sets per session. Do ____ sessions per day.  http://orth.exer.us/661   Copyright  VHI. All rights reserved.

## 2015-06-21 ENCOUNTER — Encounter: Payer: Medicare Other | Admitting: Physical Therapy

## 2015-06-23 ENCOUNTER — Encounter: Payer: Medicare Other | Admitting: Physical Therapy

## 2015-06-28 ENCOUNTER — Encounter: Payer: Medicare Other | Admitting: Physical Therapy

## 2015-06-30 ENCOUNTER — Encounter: Payer: Medicare Other | Admitting: Physical Therapy

## 2015-07-05 ENCOUNTER — Encounter: Payer: Medicare Other | Admitting: Physical Therapy

## 2015-07-07 ENCOUNTER — Encounter: Payer: Medicare Other | Admitting: Physical Therapy

## 2015-07-12 ENCOUNTER — Encounter: Payer: Medicare Other | Admitting: Physical Therapy

## 2015-07-14 ENCOUNTER — Encounter: Payer: Medicare Other | Admitting: Physical Therapy

## 2015-08-02 ENCOUNTER — Encounter: Payer: Self-pay | Admitting: Family Medicine

## 2015-08-02 ENCOUNTER — Ambulatory Visit (INDEPENDENT_AMBULATORY_CARE_PROVIDER_SITE_OTHER): Payer: Medicare Other | Admitting: Family Medicine

## 2015-08-02 VITALS — BP 136/86 | HR 62 | Wt 195.0 lb

## 2015-08-02 DIAGNOSIS — M545 Low back pain, unspecified: Secondary | ICD-10-CM

## 2015-08-02 DIAGNOSIS — N50819 Testicular pain, unspecified: Secondary | ICD-10-CM

## 2015-08-02 DIAGNOSIS — I10 Essential (primary) hypertension: Secondary | ICD-10-CM

## 2015-08-02 NOTE — Patient Instructions (Addendum)
Goal at least 5 lbs weight loss on our scales which would be 190  I like your idea of regular exercise walking 30 minutes a day  Blood pressure ok on recheck but would really like even lower to reduce risk of heart attack and stroke  Glad the back is better- try to get in 1-2x a week doing those core exercises  You can use any slot 8:15, 8:45, 1:15 or 1:45 any day of the week in the next 4 months for wife for establish visit- keep January slot for likely physical.

## 2015-08-02 NOTE — Assessment & Plan Note (Signed)
S: controlled on repeat on on amlodipine 10mg , losartan 100mg  BP Readings from Last 3 Encounters:  08/02/15 136/86  06/03/15 148/88  05/31/15 134/86  A/P:Continue current meds:  Discussed dash diet and weight loss importance- would prefer him to be in 120s 130s instead of 130s and 140s

## 2015-08-02 NOTE — Progress Notes (Signed)
Subjective:  Luke [name] is a 70 y.o. year old very pleasant male patient who presents for/with See problem oriented charting ROS- no leg weakness, no fever, chills, nausea, vomiting.see any ROS included in HPI as well.   Past Medical History-  Patient Active Problem List   Diagnosis Date Noted  . DEGENERATIVE DISC DISEASE, CERVICAL SPINE, W/RADICULOPATHY 07/28/2008    Priority: Medium  . Essential hypertension 07/08/2007    Priority: Medium  . Hyperlipidemia 05/02/2007    Priority: Medium  . GERD (gastroesophageal reflux disease) 05/02/2007    Priority: Medium  . Left knee pain 02/01/2015    Priority: Low  . Restless leg syndrome 03/30/2013    Priority: Low  . Allergic rhinitis 10/31/2009    Priority: Low  . Headache 10/31/2009    Priority: Low  . ERECTILE DYSFUNCTION, MILD 05/02/2007    Priority: Low  . PVC (premature ventricular contraction) 05/02/2007    Priority: Low    Medications- reviewed and updated Current Outpatient Prescriptions  Medication Sig Dispense Refill  . acyclovir (ZOVIRAX) 200 MG capsule TAKE 1 CAPSULE DAILY 90 capsule 3  . albuterol (PROVENTIL HFA;VENTOLIN HFA) 108 (90 Base) MCG/ACT inhaler Inhale 2 puffs into the lungs every 6 (six) hours as needed for wheezing or shortness of breath.    Marland Kitchen amLODipine (NORVASC) 10 MG tablet TAKE 1 TABLET DAILY 90 tablet 1  . aspirin 81 MG tablet Take 81 mg by mouth daily.    . fluticasone (FLONASE) 50 MCG/ACT nasal spray Place 2 sprays into both nostrils daily as needed for allergies or rhinitis. 32 g 6  . losartan (COZAAR) 100 MG tablet Take 1 tablet (100 mg total) by mouth daily. 90 tablet 3  . omeprazole (PRILOSEC) 20 MG capsule Take 1 capsule (20 mg total) by mouth daily. (Patient taking differently: Take 20 mg by mouth every other day. ) 90 capsule 3  . simvastatin (ZOCOR) 20 MG tablet Take 1 tablet (20 mg total) by mouth at bedtime. (Patient taking differently: Take 20 mg by mouth every morning. ) 90 tablet 3   . HYDROcodone-acetaminophen (NORCO/VICODIN) 5-325 MG tablet Take 1-2 tablets by mouth every 6 (six) hours as needed. (Patient not taking: Reported on 08/02/2015) 10 tablet 0   No current facility-administered medications for this visit.    Objective: BP 136/86 mmHg  Pulse 62  Wt 195 lb (88.451 kg)  SpO2 95% Gen: NAD, resting comfortably CV: RRR no murmurs rubs or gallops Lungs: CTAB no crackles, wheeze, rhonchi Abdomen: soft/nontender/nondistended/normal bowel sounds. No rebound or guarding.  Ext: no edema Male genitalia: normal, normal findings: normal circumcised penis, no urethral discharge, normal testes palpated bilaterally, normal cremasteric reflex and no varicocele present  Skin: warm, dry Neuro: grossly normal, moves all extremities  Assessment/Plan:  Essential hypertension S: controlled on repeat on on amlodipine 10mg , losartan 100mg  BP Readings from Last 3 Encounters:  08/02/15 136/86  06/03/15 148/88  05/31/15 134/86  A/P:Continue current meds:  Discussed dash diet and weight loss importance- would prefer him to be in 120s 130s instead of 130s and 140s   Right-sided low back pain without sciatica S: Right low back complained of pain 06/03/15 and had an ED visit a few days prior. Happened while working on a dance move called "the dig". We referred to PT at last visit- need to lose weight and strengthen core A/P: he still has issues if doing certain dance moves but at baseline pain down to 0/10. He has stopped doing core strengthening and  I advised him to restart at least 1-2x a week to help with maintenance/prevention  Testicular pain bilateral S: Mild soreness 1/10 for 2 weeks, not worsening since started. Does not remember triggering event. Not sexually active outside marriage.  A/P: Exam reassuring- given minimal pain doubt something like torsion. No pain along epididymis- doubt epididymitis. We discussed STD testing, UA, ultrasound. Patient declined and stated if  persists in another 2-4 weeks or worsens he would return to care for the abvoe workup.   Return in about 6 months (around 02/02/2016) for annual wellness visit. Return precautions advised.   Meds ordered this encounter  Medications  . albuterol (PROVENTIL HFA;VENTOLIN HFA) 108 (90 Base) MCG/ACT inhaler    Sig: Inhale 2 puffs into the lungs every 6 (six) hours as needed for wheezing or shortness of breath.  Marland Kitchen aspirin 81 MG tablet    Sig: Take 81 mg by mouth daily.   The duration of face-to-face time during this visit was 25 minutes. Greater than 50% of this time was spent in counseling, explanation of diagnosis, planning of further management, and/or coordination of care.   Garret Reddish, MD

## 2015-09-19 ENCOUNTER — Other Ambulatory Visit: Payer: Self-pay | Admitting: Family Medicine

## 2015-11-28 ENCOUNTER — Encounter: Payer: Self-pay | Admitting: Family Medicine

## 2015-11-28 ENCOUNTER — Ambulatory Visit (INDEPENDENT_AMBULATORY_CARE_PROVIDER_SITE_OTHER): Payer: Medicare Other | Admitting: Family Medicine

## 2015-11-28 VITALS — BP 130/76 | HR 78 | Temp 98.4°F | Wt 197.8 lb

## 2015-11-28 DIAGNOSIS — J3089 Other allergic rhinitis: Secondary | ICD-10-CM

## 2015-11-28 MED ORDER — PREDNISONE 20 MG PO TABS: ORAL_TABLET | ORAL | 0 refills | Status: DC

## 2015-11-28 NOTE — Patient Instructions (Addendum)
Health Maintenance Due  . INFLUENZA VACCINE - when feeling better 10/04/2015   Take some OTC claritin daily. If not improved by Friday, pick up prednisone and take for 7 days. If do not improve after that or new or worsening symptoms please return to see [place]

## 2015-11-28 NOTE — Progress Notes (Signed)
PCP: Garret Reddish, MD  Subjective:  Luke [name] is a 70 y.o. year old very pleasant male patient who presents with  symptoms including runny nose, watery itchy eyes, nasal congestion and postnasal drip, mild sore throat, cough mainly dry -started: on Saturday night after being outside all week then out in yard on Saturday. Symptoms are worsening -previous treatments: has not tried anything other than a few doses of flonase which help -sick contacts/travel/risks: denies flu exposure.  -Hx of: allergies- on flonase regularly in past but has been missing doses  ROS-denies fever, SOB, NVD, tooth pain  Pertinent Past Medical History- hyperlipidemia, hypertension, GERD  Medications- reviewed  Current Outpatient Prescriptions  Medication Sig Dispense Refill  . acyclovir (ZOVIRAX) 200 MG capsule TAKE 1 CAPSULE DAILY 90 capsule 3  . albuterol (PROVENTIL HFA;VENTOLIN HFA) 108 (90 Base) MCG/ACT inhaler Inhale 2 puffs into the lungs every 6 (six) hours as needed for wheezing or shortness of breath.    Marland Kitchen amLODipine (NORVASC) 10 MG tablet TAKE 1 TABLET DAILY 90 tablet 1  . aspirin 81 MG tablet Take 81 mg by mouth daily.    . fluticasone (FLONASE) 50 MCG/ACT nasal spray Place 2 sprays into both nostrils daily as needed for allergies or rhinitis. 32 g 6  . losartan (COZAAR) 100 MG tablet Take 1 tablet (100 mg total) by mouth daily. 90 tablet 3  . omeprazole (PRILOSEC) 20 MG capsule Take 1 capsule (20 mg total) by mouth daily. (Patient taking differently: Take 20 mg by mouth every other day. ) 90 capsule 3  . simvastatin (ZOCOR) 20 MG tablet Take 1 tablet (20 mg total) by mouth at bedtime. (Patient taking differently: Take 20 mg by mouth every morning. ) 90 tablet 3   Objective: BP 130/76 (BP Location: Left Arm, Patient Position: Sitting, Cuff Size: Normal)   Pulse 78   Temp 98.4 F (36.9 C) (Oral)   Wt 197 lb 12.8 oz (89.7 kg)   SpO2 95%   BMI 31.93 kg/m  Gen: NAD HEENT: Turbinates  erythematous with clear drainage, TM normal, pharynx mildly erythematous with no tonsilar exudate or edema, no sinus tenderness CV: RRR no murmurs rubs or gallops Lungs: CTAB no crackles, wheeze, rhonchi Ext: no edema Skin: warm, dry, no rash Neuro: grossly normal, moves all extremities  Assessment/Plan:  Allergic rhinitis LIkely flare of allergic rhinitis as off flonase and a lot of outdoor exposure. Start claritin, restart regular flonase. He has responded well to prednisone in past - given rx if he is not turning the corner by Friday.   Finally, we reviewed reasons to return to care including if symptoms worsen or persist or new concerns arise.  Meds ordered this encounter  Medications  . predniSONE (DELTASONE) 20 MG tablet    Sig: Take 2 pills for 1 day then 1 pill for 6 days    Dispense:  8 tablet    Refill:  0    Garret Reddish, MD

## 2015-11-28 NOTE — Progress Notes (Signed)
Pre visit review using our clinic review tool, if applicable. No additional management support is needed unless otherwise documented below in the visit note. 

## 2015-12-13 ENCOUNTER — Ambulatory Visit (INDEPENDENT_AMBULATORY_CARE_PROVIDER_SITE_OTHER): Payer: Medicare Other | Admitting: Family Medicine

## 2015-12-13 DIAGNOSIS — Z23 Encounter for immunization: Secondary | ICD-10-CM | POA: Diagnosis not present

## 2016-02-15 ENCOUNTER — Telehealth: Payer: Self-pay

## 2016-02-15 NOTE — Telephone Encounter (Signed)
Call to Luke [name], He is coming in 8:45 to see Dr. Yong Channel Agreed to come in at 8:15 for AWV with Khanh Tanori prior to seeing Dr. Yong Channel

## 2016-02-16 ENCOUNTER — Other Ambulatory Visit: Payer: Self-pay | Admitting: Family Medicine

## 2016-02-16 DIAGNOSIS — E785 Hyperlipidemia, unspecified: Secondary | ICD-10-CM

## 2016-02-17 ENCOUNTER — Encounter: Payer: Self-pay | Admitting: Family Medicine

## 2016-02-17 ENCOUNTER — Ambulatory Visit (INDEPENDENT_AMBULATORY_CARE_PROVIDER_SITE_OTHER): Payer: Medicare Other | Admitting: Family Medicine

## 2016-02-17 ENCOUNTER — Other Ambulatory Visit: Payer: Self-pay | Admitting: Family Medicine

## 2016-02-17 VITALS — BP 130/84 | HR 63 | Ht 66.5 in | Wt 192.4 lb

## 2016-02-17 DIAGNOSIS — N401 Enlarged prostate with lower urinary tract symptoms: Secondary | ICD-10-CM

## 2016-02-17 DIAGNOSIS — K219 Gastro-esophageal reflux disease without esophagitis: Secondary | ICD-10-CM

## 2016-02-17 DIAGNOSIS — Z7289 Other problems related to lifestyle: Secondary | ICD-10-CM | POA: Diagnosis not present

## 2016-02-17 DIAGNOSIS — E785 Hyperlipidemia, unspecified: Secondary | ICD-10-CM | POA: Diagnosis not present

## 2016-02-17 DIAGNOSIS — R739 Hyperglycemia, unspecified: Secondary | ICD-10-CM

## 2016-02-17 DIAGNOSIS — I10 Essential (primary) hypertension: Secondary | ICD-10-CM | POA: Diagnosis not present

## 2016-02-17 DIAGNOSIS — R351 Nocturia: Secondary | ICD-10-CM | POA: Diagnosis not present

## 2016-02-17 DIAGNOSIS — Z0000 Encounter for general adult medical examination without abnormal findings: Secondary | ICD-10-CM | POA: Diagnosis not present

## 2016-02-17 LAB — LIPID PANEL
CHOLESTEROL: 126 mg/dL (ref 0–200)
HDL: 29.4 mg/dL — AB (ref >39.00)
LDL CALC: 62 mg/dL (ref 0–99)
NonHDL: 96.79
TRIGLYCERIDES: 176 mg/dL — AB (ref 0.0–149.0)
Total CHOL/HDL Ratio: 4
VLDL: 35.2 mg/dL (ref 0.0–40.0)

## 2016-02-17 LAB — POC URINALSYSI DIPSTICK (AUTOMATED)
BILIRUBIN UA: NEGATIVE
Blood, UA: NEGATIVE
GLUCOSE UA: NEGATIVE
KETONES UA: NEGATIVE
Leukocytes, UA: NEGATIVE
Nitrite, UA: NEGATIVE
SPEC GRAV UA: 1.02
Urobilinogen, UA: 0.2
pH, UA: 5.5

## 2016-02-17 LAB — COMPREHENSIVE METABOLIC PANEL
ALBUMIN: 4.3 g/dL (ref 3.5–5.2)
ALT: 17 U/L (ref 0–53)
AST: 15 U/L (ref 0–37)
Alkaline Phosphatase: 85 U/L (ref 39–117)
BUN: 18 mg/dL (ref 6–23)
CALCIUM: 9.2 mg/dL (ref 8.4–10.5)
CHLORIDE: 106 meq/L (ref 96–112)
CO2: 31 mEq/L (ref 19–32)
Creatinine, Ser: 1 mg/dL (ref 0.40–1.50)
GFR: 78.53 mL/min (ref >60.00)
Glucose, Bld: 113 mg/dL — ABNORMAL HIGH (ref 70–99)
POTASSIUM: 4.4 meq/L (ref 3.5–5.1)
SODIUM: 143 meq/L (ref 135–145)
Total Bilirubin: 0.5 mg/dL (ref 0.2–1.2)
Total Protein: 6.6 g/dL (ref 6.0–8.3)

## 2016-02-17 LAB — PSA: PSA: 2.17 ng/mL (ref 0.10–4.00)

## 2016-02-17 LAB — CBC
HEMATOCRIT: 43.6 % (ref 39.0–52.0)
Hemoglobin: 14.9 g/dL (ref 13.0–17.0)
MCHC: 34.1 g/dL (ref 30.0–36.0)
MCV: 84.3 fl (ref 78.0–100.0)
Platelets: 295 10*3/uL (ref 150.0–400.0)
RBC: 5.18 Mil/uL (ref 4.22–5.81)
RDW: 13.4 % (ref 11.5–15.5)
WBC: 10.5 10*3/uL (ref 4.0–10.5)

## 2016-02-17 LAB — HEPATITIS C ANTIBODY: HCV Ab: NEGATIVE

## 2016-02-17 MED ORDER — TETANUS-DIPHTH-ACELL PERTUSSIS 5-2.5-18.5 LF-MCG/0.5 IM SUSP: 0.5000 mL | Freq: Once | INTRAMUSCULAR | 0 refills | Status: AC

## 2016-02-17 NOTE — Progress Notes (Signed)
Subjective:   Luke [name] is a 70 y.o. male who presents for Medicare Annual/Subsequent preventive examination.  The Patient was informed that the wellness visit is to identify future health risk and educate and initiate measures that can reduce risk for increased disease through the lifespan.    NO ROS; Medicare Wellness Visit  Lives with spouse x  51 years 2 children; 4 grands   Describes health as good, fair or great? Good   Preventive Screening -Counseling & Management  Colonoscopy -04/2014; no issues; received records from [facility] today and abstracted in (noted by Dr. Yong Channel)  Tdap to take at pharmacy and will get if he has dirty cut etc. Per Dr. Yong Channel- to get this ahead of time and let us know by mychart.  Hep c agrees to draw screen with labs today  Current smoking/ tobacco status /never smoked  ETOH  YES; occasionally   RISK FACTORS Regular exercise  Stays active; errands; project every day [employer];    Diet/ slow dow on sugar    Cardiac Risk Factors:  Advanced aged > 38 in men; Hyperlipidemia - to be drawn; last year trig 70; HDL 25  Diabetes- fbs elevated; 107; A1c 2013  Family History (+ for stroke; HTN; pvd; hyperlipidemia, CAD ) 2 brothers; one had throat cancer and one lung cancer )  Obesity 31   Eye exam; last spring;  Dr. Marica Otter   Depression Screen neg PhQ 2: negative  Activities of Daily Living - See functional screen   Cognitive testing; Ad8 score; 0 or less than 2  MMSE deferred or completed if AD8 + 2 issues  Advanced Directives given copy of [place]   Patient Care Team: Marin Olp, MD as PCP - General (Family Medicine)   Immunization History  Administered Date(s) Administered  . Influenza Split 12/18/2010, 01/04/2012  . Influenza Whole 12/20/2009  . Influenza, High Dose Seasonal PF 12/17/2012, 12/13/2015  . Influenza,inj,Quad PF,36+ Mos 11/12/2013, 01/06/2015  . Pneumococcal Conjugate-13 11/12/2013  .  Pneumococcal Polysaccharide-23 06/13/2011  . Td 04/23/2003  . Zoster 01/16/2013   Required Immunizations needed today  Screening test up to date or reviewed for plan of completion Health Maintenance Due  Topic Date Due  . TETANUS/TDAP  04/22/2013   Agreed to Hep c at next blood draw  The patient stated colonoscopy completed at [facility]; 04/27/2014 per [facility] - Dr Yong Channel noted colonoscopy at [facility]  Call to [place] and confirmed by phone this patient was seen as dated above and will have repeat in 04/2019 (5years)  Will note in epic  Will go to pharmacy for tdap   Cardiac Risk Factors include: advanced age (>40men, >89 women);dyslipidemia;hypertension;male gender     Objective:    Vitals: BP 130/84   Pulse 63   Ht 5' 6.5" (1.689 m)   Wt 192 lb 6 oz (87.3 kg)   SpO2 98%   BMI 30.58 kg/m   Body mass index is 30.58 kg/m.  Tobacco History  Smoking Status  . Never Smoker  Smokeless Tobacco  . Never Used     Counseling given: Yes   Past Medical History:  Diagnosis Date  . Asthma   . Depression   . ED (erectile dysfunction)   . Hyperlipidemia   . Hypertension   . Solitary pulmonary nodule 04/16/2013   03/30/13  CXR Question left nipple shadow.  10 mm ovoid nodular density right upper lobe, cannot exclude  pulmonary mass/ nodule > f/u cxr 04/29/2013 >  no nodule    . Ulcer ([place])    No past surgical history on file. Family History  Problem Relation Age of Onset  . Stroke Mother   . Hypertension Mother   . Peripheral vascular disease Mother   . Hyperlipidemia Father   . Coronary artery disease Father   . Peripheral vascular disease Father   . Hypertension Brother   . Coronary artery disease Brother   . Cancer Brother     throat, smoked  . Emphysema Brother     smoked  . Lung cancer Brother     smoked   History  Sexual Activity  . Sexual activity: Not on file    Outpatient Encounter Prescriptions as of 02/17/2016  Medication Sig  . acyclovir (ZOVIRAX) 200 MG  capsule TAKE 1 CAPSULE DAILY  . albuterol (PROVENTIL HFA;VENTOLIN HFA) 108 (90 Base) MCG/ACT inhaler Inhale 2 puffs into the lungs every 6 (six) hours as needed for wheezing or shortness of breath.  Marland Kitchen amLODipine (NORVASC) 10 MG tablet TAKE 1 TABLET DAILY  . aspirin 81 MG tablet Take 81 mg by mouth daily.  . fluticasone (FLONASE) 50 MCG/ACT nasal spray Place 2 sprays into both nostrils daily as needed for allergies or rhinitis.  Marland Kitchen losartan (COZAAR) 100 MG tablet Take 1 tablet (100 mg total) by mouth daily.  Marland Kitchen omeprazole (PRILOSEC) 20 MG capsule Take 1 capsule (20 mg total) by mouth daily. (Patient taking differently: Take 20 mg by mouth every other day. )  . simvastatin (ZOCOR) 20 MG tablet TAKE 1 TABLET AT BEDTIME  . [DISCONTINUED] predniSONE (DELTASONE) 20 MG tablet Take 2 pills for 1 day then 1 pill for 6 days  . Tdap (BOOSTRIX) 5-2.5-18.5 LF-MCG/0.5 injection Inject 0.5 mLs into the muscle once.   No facility-administered encounter medications on file as of 02/17/2016.     Activities of Daily Living In your present state of health, do you have any difficulty performing the following activities: 02/17/2016  Hearing? N  Vision? Y  Difficulty concentrating or making decisions? N  Walking or climbing stairs? N  Dressing or bathing? N  Doing errands, shopping? N  Preparing Food and eating ? N  Using the Toilet? N  Managing your Medications? N  Managing your Finances? N  Housekeeping or managing your Housekeeping? N  Some recent data might be hidden    Patient Care Team: Marin Olp, MD as PCP - General (Family Medicine)   Assessment:     Exercise Activities and Dietary recommendations Current Exercise Habits: Home exercise routine, Intensity: Mild  Goals    . Weight (lb) < 180 lb (81.6 kg)          Will try to lose 10 lbs; will try to eat less sugar      Fall Risk Fall Risk  02/17/2016 02/17/2016 08/02/2015 01/06/2015 07/16/2013  Falls in the past year? No No No No Yes    Number falls in past yr: - - - - 1  Injury with Fall? - - - - Yes   Depression Screen PHQ 2/9 Scores 02/17/2016 08/02/2015 01/06/2015 07/16/2013  PHQ - 2 Score 0 0 0 0    Cognitive Function MMSE - Mini Mental State Exam 02/17/2016  Not completed: (No Data)        Immunization History  Administered Date(s) Administered  . Influenza Split 12/18/2010, 01/04/2012  . Influenza Whole 12/20/2009  . Influenza, High Dose Seasonal PF 12/17/2012, 12/13/2015  . Influenza,inj,Quad PF,36+ Mos 11/12/2013, 01/06/2015  . Pneumococcal  Conjugate-13 11/12/2013  . Pneumococcal Polysaccharide-23 06/13/2011  . Td 04/23/2003  . Zoster 01/16/2013   Screening Tests Health Maintenance  Topic Date Due  . Hepatitis C Screening  [birth year]  . COLONOSCOPY  03/15/1996  . TETANUS/TDAP  04/22/2013  . INFLUENZA VACCINE  Completed  . ZOSTAVAX  Completed  . PNA vac Low Risk Adult  Completed      Plan:      Call to [facility] and confirmed colonoscopy due 04/2019  The patient took a copy of an Advanced Directive   Will take Tdap at the pharmacy  During the course of the visit the patient was educated and counseled about the following appropriate screening and preventive services:   Vaccines to include Pneumoccal, Influenza, Hepatitis B, Td, Zostavax, HCV  Electrocardiogram  Cardiovascular Disease  Colorectal cancer screening  Diabetes screening  Prostate Cancer Screening  Glaucoma screening  Nutrition counseling   Smoking cessation counseling  Patient Instructions (the written plan) was given to the patient.    [id number], RN  02/17/2016   I have reviewed and agree with note, evaluation, plan.   Garret Reddish, MD

## 2016-02-17 NOTE — Patient Instructions (Addendum)
Labs before you leave  Most important thing for you is weight loss. I would set goal of 10 lbs in 6 months at least- see me back in 6 months. 150 minutes a week exercise goal as long as no chest pain or abnormal shortness of breath. 3-5 servings veggies a day. Water only to drink (black coffee ok in AM)  Take tetanus shot to pharmacy - update [place] when you get this by Luke [name] will sign you uop for mychart before you leave  Sign release of information at the check out desk for colonoscopy from [facility]  Let Luke [name] know if you need refills and she can set these up    Luke [name] , Thank you for taking time to come for your Medicare Wellness Visit. I appreciate your ongoing commitment to your health goals. Please review the following plan we discussed and let me know if I can assist you in the future.   Will take Tetanus with pertussis at pharmacy; or if laceration come to the office TDAP  [facility] in [facility]; [facility]; ? Report      These are the goals we discussed: Goals    . Weight (lb) < 180 lb (81.6 kg)          Will try to lose 10 lbs; will try to eat less sugar       This is a list of the screening recommended for you and due dates:  Health Maintenance  Topic Date Due  .  Hepatitis C: One time screening is recommended by Center for Disease Control  (CDC) for  adults born from [birth year] through 1964.   1945/12/06  . Colon Cancer Screening  03/15/1996  . Tetanus Vaccine  04/22/2013  . Flu Shot  Completed  . Shingles Vaccine  Completed  . Pneumonia vaccines  Completed

## 2016-02-17 NOTE — Progress Notes (Signed)
Subjective:  Luke [name] is a 70 y.o. year old very pleasant male patient who presents for/with See problem oriented charting ROS- nocturia, no chest pain or shortness of breath, no recent wheeze.  Past Medical History-  Patient Active Problem List   Diagnosis Date Noted  . Hyperglycemia 02/18/2016    Priority: Medium  . BPH associated with nocturia 02/17/2016    Priority: Medium  . DEGENERATIVE DISC DISEASE, CERVICAL SPINE, W/RADICULOPATHY 07/28/2008    Priority: Medium  . Essential hypertension 07/08/2007    Priority: Medium  . Asthma 07/02/2007    Priority: Medium  . Hyperlipidemia 05/02/2007    Priority: Medium  . GERD (gastroesophageal reflux disease) 05/02/2007    Priority: Medium  . Genital herpes 02/18/2016    Priority: Low  . Left knee pain 02/01/2015    Priority: Low  . Restless leg syndrome 03/30/2013    Priority: Low  . Allergic rhinitis 10/31/2009    Priority: Low  . Headache 10/31/2009    Priority: Low  . ERECTILE DYSFUNCTION, MILD 05/02/2007    Priority: Low  . PVC (premature ventricular contraction) 05/02/2007    Priority: Low    Medications- reviewed and updated Current Outpatient Prescriptions  Medication Sig Dispense Refill  . acyclovir (ZOVIRAX) 200 MG capsule TAKE 1 CAPSULE DAILY 90 capsule 3  . albuterol (PROVENTIL HFA;VENTOLIN HFA) 108 (90 Base) MCG/ACT inhaler Inhale 2 puffs into the lungs every 6 (six) hours as needed for wheezing or shortness of breath.    Marland Kitchen amLODipine (NORVASC) 10 MG tablet TAKE 1 TABLET DAILY 90 tablet 1  . aspirin 81 MG tablet Take 81 mg by mouth daily.    . fluticasone (FLONASE) 50 MCG/ACT nasal spray Place 2 sprays into both nostrils daily as needed for allergies or rhinitis. 32 g 6  . losartan (COZAAR) 100 MG tablet Take 1 tablet (100 mg total) by mouth daily. 90 tablet 3  . omeprazole (PRILOSEC) 20 MG capsule Take 1 capsule (20 mg total) by mouth daily. (Patient taking differently: Take 20 mg by mouth every other  day. ) 90 capsule 3  . simvastatin (ZOCOR) 20 MG tablet TAKE 1 TABLET AT BEDTIME 90 tablet 3   No current facility-administered medications for this visit.    Objective: BP 130/84   Pulse 63   Ht 5' 6.5" (1.689 m)   Wt 192 lb 6 oz (87.3 kg)   SpO2 98%   BMI 30.58 kg/m  Gen: NAD, resting comfortably CV: RRR no murmurs rubs or gallops Lungs: CTAB no crackles, wheeze, rhonchi Abdomen: soft/nontender/nondistended/normal bowel sounds. overweight Ext: no edema Skin: warm, dry  Assessment/Plan:  Hyperlipdemia S: reasonably controlled on simvastatin 20mg . No myalgias.  Lab Results  Component Value Date   CHOL 126 02/17/2016   HDL 29.40 (L) 02/17/2016   LDLCALC 62 02/17/2016   LDLDIRECT 73.0 02/08/2015   TRIG 176.0 (H) 02/17/2016   CHOLHDL 4 02/17/2016   A/P: update lipids today  GERD S: patient compliant with omeprazole 20mg . Peptic ulcer over 30 years ago. On aspirin.  A/P: discussed trial of zantac- he states even if misses a dose of omeprazole gets severe symptoms so wants to continue  Hypertension S: controlled per [facility] on amlodipine 10mg  and losartan 100mg .  BP Readings from Last 3 Encounters:  02/17/16 130/84  11/28/15 130/76  08/02/15 136/86  A/P:Continue current meds:  Well controlled  Acyclovir -genital herpes- no flare ups   Albuterol certain months- asthma. No recent use in over 2 months  Some  BPH- will monitor but there has been variability. Nocturia about once a night sometimes two Lab Results  Component Value Date   PSA 2.17 02/17/2016   PSA 1.46 02/08/2015   PSA 2.11 10/27/2013    Discussed importance of weight loss- cbg suggest increased risk of diabetes  Please also see avs for portion of discussion 6 months verbal  Orders Placed This Encounter  Procedures  . PSA  . CBC    [place]  . Comprehensive metabolic panel    [place]    Order Specific Question:   Has the patient fasted?    Answer:   No  . Lipid panel        Order Specific  Question:   Has the patient fasted?    Answer:   No  . Hepatitis C antibody, reflex    [facility]  . POCT Urinalysis Dipstick (Automated)  . HM COLONOSCOPY    This external order was created through the Results Console.   Will let us know after he gets this at pharmacy Meds ordered this encounter  Medications  . Tdap (BOOSTRIX) 5-2.5-18.5 LF-MCG/0.5 injection    Sig: Inject 0.5 mLs into the muscle once.    Dispense:  0.5 mL    Refill:  0    Return precautions advised.  Garret Reddish, MD

## 2016-02-18 ENCOUNTER — Encounter: Payer: Self-pay | Admitting: Family Medicine

## 2016-02-18 DIAGNOSIS — A6000 Herpesviral infection of urogenital system, unspecified: Secondary | ICD-10-CM | POA: Insufficient documentation

## 2016-02-18 DIAGNOSIS — R739 Hyperglycemia, unspecified: Secondary | ICD-10-CM | POA: Insufficient documentation

## 2016-03-12 ENCOUNTER — Ambulatory Visit: Payer: Medicare Other | Admitting: Family Medicine

## 2016-03-19 ENCOUNTER — Other Ambulatory Visit: Payer: Self-pay | Admitting: Family Medicine

## 2016-04-26 ENCOUNTER — Telehealth: Payer: Self-pay | Admitting: Family Medicine

## 2016-05-01 ENCOUNTER — Other Ambulatory Visit: Payer: Self-pay

## 2016-05-01 MED ORDER — LOSARTAN POTASSIUM 100 MG PO TABS: 100.0000 mg | ORAL_TABLET | Freq: Every day | ORAL | 3 refills | Status: DC

## 2016-05-01 NOTE — Telephone Encounter (Signed)
Pt following up on request refill  losartan (COZAAR) 100 MG tablet   Last md to fill was Dr Sherren Mocha.  Pt sees Dr Yong Channel now.   Can you send to  [place], [place] to Registered [employer] Sites

## 2016-05-01 NOTE — Telephone Encounter (Signed)
Prescription sent to pharmacy as requested.

## 2016-05-01 NOTE — Telephone Encounter (Signed)
Duplicate Request. Sent in on 04/28/16

## 2016-06-14 ENCOUNTER — Other Ambulatory Visit: Payer: Self-pay | Admitting: Family Medicine

## 2016-06-14 ENCOUNTER — Encounter: Payer: Self-pay | Admitting: Family Medicine

## 2016-06-15 ENCOUNTER — Encounter: Payer: Self-pay | Admitting: Family Medicine

## 2016-07-23 ENCOUNTER — Other Ambulatory Visit: Payer: Self-pay | Admitting: Family Medicine

## 2016-08-06 ENCOUNTER — Other Ambulatory Visit: Payer: Self-pay | Admitting: Family Medicine

## 2016-08-13 ENCOUNTER — Encounter: Payer: Self-pay | Admitting: Family Medicine

## 2016-08-13 MED ORDER — CYCLOBENZAPRINE HCL 10 MG PO TABS
10.0000 mg | ORAL_TABLET | Freq: Three times a day (TID) | ORAL | 0 refills | Status: DC | PRN
Start: 2016-08-13 — End: 2016-09-27

## 2016-09-02 DIAGNOSIS — I480 Paroxysmal atrial fibrillation: Secondary | ICD-10-CM

## 2016-09-02 HISTORY — DX: Paroxysmal atrial fibrillation: I48.0

## 2016-09-02 HISTORY — PX: TRANSTHORACIC ECHOCARDIOGRAM: SHX275

## 2016-09-25 ENCOUNTER — Ambulatory Visit (INDEPENDENT_AMBULATORY_CARE_PROVIDER_SITE_OTHER): Payer: Medicare Other | Admitting: Family Medicine

## 2016-09-25 ENCOUNTER — Inpatient Hospital Stay (HOSPITAL_COMMUNITY)
Admission: EM | Admit: 2016-09-25 | Discharge: 2016-09-27 | DRG: 310 | Disposition: A | Discharge status: Home / Self Care | Payer: Medicare Other | Attending: Internal Medicine | Admitting: Internal Medicine

## 2016-09-25 ENCOUNTER — Encounter: Payer: Self-pay | Admitting: Family Medicine

## 2016-09-25 ENCOUNTER — Emergency Department (HOSPITAL_COMMUNITY): Payer: Medicare Other

## 2016-09-25 ENCOUNTER — Encounter (HOSPITAL_COMMUNITY): Payer: Self-pay | Admitting: Emergency Medicine

## 2016-09-25 VITALS — BP 102/78 | HR 71 | Temp 98.6°F | Ht 66.5 in | Wt 189.0 lb

## 2016-09-25 DIAGNOSIS — J45909 Unspecified asthma, uncomplicated: Secondary | ICD-10-CM | POA: Diagnosis present

## 2016-09-25 DIAGNOSIS — I480 Paroxysmal atrial fibrillation: Secondary | ICD-10-CM | POA: Diagnosis present

## 2016-09-25 DIAGNOSIS — E785 Hyperlipidemia, unspecified: Secondary | ICD-10-CM | POA: Diagnosis present

## 2016-09-25 DIAGNOSIS — I499 Cardiac arrhythmia, unspecified: Secondary | ICD-10-CM | POA: Diagnosis not present

## 2016-09-25 DIAGNOSIS — R509 Fever, unspecified: Secondary | ICD-10-CM | POA: Diagnosis not present

## 2016-09-25 DIAGNOSIS — K219 Gastro-esophageal reflux disease without esophagitis: Secondary | ICD-10-CM | POA: Diagnosis present

## 2016-09-25 DIAGNOSIS — Z79899 Other long term (current) drug therapy: Secondary | ICD-10-CM | POA: Diagnosis not present

## 2016-09-25 DIAGNOSIS — Z888 Allergy status to other drugs, medicaments and biological substances status: Secondary | ICD-10-CM

## 2016-09-25 DIAGNOSIS — Z7951 Long term (current) use of inhaled steroids: Secondary | ICD-10-CM | POA: Diagnosis not present

## 2016-09-25 DIAGNOSIS — Z8249 Family history of ischemic heart disease and other diseases of the circulatory system: Secondary | ICD-10-CM

## 2016-09-25 DIAGNOSIS — R0602 Shortness of breath: Secondary | ICD-10-CM | POA: Diagnosis not present

## 2016-09-25 DIAGNOSIS — I472 Ventricular tachycardia: Secondary | ICD-10-CM

## 2016-09-25 DIAGNOSIS — I4510 Unspecified right bundle-branch block: Secondary | ICD-10-CM | POA: Diagnosis present

## 2016-09-25 DIAGNOSIS — I10 Essential (primary) hypertension: Secondary | ICD-10-CM | POA: Diagnosis present

## 2016-09-25 DIAGNOSIS — R000 Tachycardia, unspecified: Secondary | ICD-10-CM | POA: Diagnosis not present

## 2016-09-25 DIAGNOSIS — I4891 Unspecified atrial fibrillation: Principal | ICD-10-CM | POA: Diagnosis present

## 2016-09-25 DIAGNOSIS — R52 Pain, unspecified: Secondary | ICD-10-CM | POA: Diagnosis not present

## 2016-09-25 DIAGNOSIS — Z886 Allergy status to analgesic agent status: Secondary | ICD-10-CM

## 2016-09-25 DIAGNOSIS — I4729 Other ventricular tachycardia: Secondary | ICD-10-CM

## 2016-09-25 DIAGNOSIS — Z825 Family history of asthma and other chronic lower respiratory diseases: Secondary | ICD-10-CM

## 2016-09-25 DIAGNOSIS — R079 Chest pain, unspecified: Secondary | ICD-10-CM | POA: Diagnosis not present

## 2016-09-25 DIAGNOSIS — E784 Other hyperlipidemia: Secondary | ICD-10-CM | POA: Diagnosis not present

## 2016-09-25 LAB — CBC
HEMATOCRIT: 43.1 % (ref 39.0–52.0)
Hemoglobin: 14.9 g/dL (ref 13.0–17.0)
MCH: 28.8 pg (ref 26.0–34.0)
MCHC: 34.6 g/dL (ref 30.0–36.0)
MCV: 83.2 fL (ref 78.0–100.0)
Platelets: 262 10*3/uL (ref 150–400)
RBC: 5.18 MIL/uL (ref 4.22–5.81)
RDW: 13.6 % (ref 11.5–15.5)
WBC: 10.6 10*3/uL — ABNORMAL HIGH (ref 4.0–10.5)

## 2016-09-25 LAB — I-STAT TROPONIN, ED: Troponin i, poc: 0 ng/mL (ref 0.00–0.08)

## 2016-09-25 LAB — RAPID URINE DRUG SCREEN, HOSP PERFORMED
AMPHETAMINES: NOT DETECTED
Barbiturates: NOT DETECTED
Benzodiazepines: NOT DETECTED
Cocaine: NOT DETECTED
Opiates: NOT DETECTED
Tetrahydrocannabinol: NOT DETECTED

## 2016-09-25 LAB — APTT: APTT: 36 s (ref 24–36)

## 2016-09-25 LAB — BASIC METABOLIC PANEL
ANION GAP: 12 (ref 5–15)
BUN: 16 mg/dL (ref 6–20)
CHLORIDE: 103 mmol/L (ref 101–111)
CO2: 22 mmol/L (ref 22–32)
Calcium: 9 mg/dL (ref 8.9–10.3)
Creatinine, Ser: 1.1 mg/dL (ref 0.61–1.24)
Glucose, Bld: 109 mg/dL — ABNORMAL HIGH (ref 65–99)
POTASSIUM: 3.7 mmol/L (ref 3.5–5.1)
SODIUM: 137 mmol/L (ref 135–145)

## 2016-09-25 LAB — POC INFLUENZA A&B (BINAX/QUICKVUE)
INFLUENZA B, POC: NEGATIVE
Influenza A, POC: NEGATIVE

## 2016-09-25 LAB — I-STAT CG4 LACTIC ACID, ED: Lactic Acid, Venous: 1.55 mmol/L (ref 0.5–1.9)

## 2016-09-25 LAB — PROTIME-INR
INR: 1.05
Prothrombin Time: 13.7 seconds (ref 11.4–15.2)

## 2016-09-25 LAB — BRAIN NATRIURETIC PEPTIDE: B NATRIURETIC PEPTIDE 5: 70 pg/mL (ref 0.0–100.0)

## 2016-09-25 LAB — TROPONIN I

## 2016-09-25 MED ORDER — SIMVASTATIN 20 MG PO TABS
20.0000 mg | ORAL_TABLET | Freq: Every day | ORAL | Status: DC
  Filled 2016-09-25: qty 1

## 2016-09-25 MED ORDER — AMLODIPINE BESYLATE 10 MG PO TABS
10.0000 mg | ORAL_TABLET | Freq: Every day | ORAL | Status: DC
  Administered 2016-09-26 – 2016-09-27 (×2): 10 mg via ORAL
  Filled 2016-09-25: qty 2
  Filled 2016-09-25: qty 1

## 2016-09-25 MED ORDER — ONDANSETRON HCL 4 MG/2ML IJ SOLN
4.0000 mg | Freq: Four times a day (QID) | INTRAMUSCULAR | Status: DC | PRN
Start: 2016-09-25 — End: 2016-09-27
  Administered 2016-09-26: 4 mg via INTRAVENOUS
  Filled 2016-09-25 (×2): qty 2

## 2016-09-25 MED ORDER — DOXYCYCLINE HYCLATE 100 MG PO TABS
100.0000 mg | ORAL_TABLET | Freq: Two times a day (BID) | ORAL | Status: DC
  Administered 2016-09-25 – 2016-09-27 (×4): 100 mg via ORAL
  Filled 2016-09-25 (×4): qty 1

## 2016-09-25 MED ORDER — HEPARIN (PORCINE) IN NACL 100-0.45 UNIT/ML-% IJ SOLN
1100.0000 [IU]/h | INTRAMUSCULAR | Status: DC
  Administered 2016-09-25 – 2016-09-27 (×3): 1250 [IU]/h via INTRAVENOUS
  Filled 2016-09-25 (×3): qty 250

## 2016-09-25 MED ORDER — PANTOPRAZOLE SODIUM 40 MG PO TBEC
40.0000 mg | DELAYED_RELEASE_TABLET | Freq: Every day | ORAL | Status: DC
  Administered 2016-09-26 – 2016-09-27 (×2): 40 mg via ORAL
  Filled 2016-09-25 (×2): qty 1

## 2016-09-25 MED ORDER — ACETAMINOPHEN 325 MG PO TABS: 650.0000 mg | ORAL_TABLET | ORAL | Status: DC | PRN

## 2016-09-25 MED ORDER — LOSARTAN POTASSIUM 50 MG PO TABS
100.0000 mg | ORAL_TABLET | Freq: Every day | ORAL | Status: DC
Start: 2016-09-26 — End: 2016-09-27
  Administered 2016-09-26 – 2016-09-27 (×2): 100 mg via ORAL
  Filled 2016-09-25 (×2): qty 2

## 2016-09-25 MED ORDER — DILTIAZEM LOAD VIA INFUSION
20.0000 mg | Freq: Once | INTRAVENOUS | Status: AC
  Administered 2016-09-25: 20 mg via INTRAVENOUS
  Filled 2016-09-25: qty 20

## 2016-09-25 MED ORDER — HEPARIN BOLUS VIA INFUSION
4000.0000 [IU] | Freq: Once | INTRAVENOUS | Status: AC
  Administered 2016-09-25: 4000 [IU] via INTRAVENOUS
  Filled 2016-09-25: qty 4000

## 2016-09-25 MED ORDER — SODIUM CHLORIDE 0.9 % IV BOLUS (SEPSIS)
1000.0000 mL | Freq: Once | INTRAVENOUS | Status: AC
  Administered 2016-09-25: 1000 mL via INTRAVENOUS

## 2016-09-25 MED ORDER — DILTIAZEM HCL-DEXTROSE 100-5 MG/100ML-% IV SOLN (PREMIX)
5.0000 mg/h | INTRAVENOUS | Status: DC
  Administered 2016-09-25: 5 mg/h via INTRAVENOUS
  Administered 2016-09-26: 10 mg/h via INTRAVENOUS
  Filled 2016-09-25 (×2): qty 100

## 2016-09-25 MED ORDER — ALBUTEROL SULFATE (2.5 MG/3ML) 0.083% IN NEBU: 2.5000 mg | INHALATION_SOLUTION | Freq: Four times a day (QID) | RESPIRATORY_TRACT | Status: DC | PRN

## 2016-09-25 NOTE — ED Triage Notes (Addendum)
Pt sent by PCP for abnormal EKG; was seen at PCP for URI; pt verbalizes SOB,productive cough, and fever. Denies pain.

## 2016-09-25 NOTE — Progress Notes (Signed)
Subjective:  ACEN CRAUN is a 71 y.o. year old very pleasant male patient who presents for/with See problem oriented charting ROS- has had fever and chills. Has had cough and congestion as well as wheeze. Some shortness of breath with stairs which is new.    Past Medical History-  Patient Active Problem List   Diagnosis Date Noted  . Hyperglycemia 02/18/2016    Priority: Medium  . BPH associated with nocturia 02/17/2016    Priority: Medium  . DEGENERATIVE DISC DISEASE, CERVICAL SPINE, W/RADICULOPATHY 07/28/2008    Priority: Medium  . Essential hypertension 07/08/2007    Priority: Medium  . Asthma 07/02/2007    Priority: Medium  . Hyperlipidemia 05/02/2007    Priority: Medium  . GERD (gastroesophageal reflux disease) 05/02/2007    Priority: Medium  . Genital herpes 02/18/2016    Priority: Low  . Left knee pain 02/01/2015    Priority: Low  . Restless leg syndrome 03/30/2013    Priority: Low  . Allergic rhinitis 10/31/2009    Priority: Low  . Headache 10/31/2009    Priority: Low  . ERECTILE DYSFUNCTION, MILD 05/02/2007    Priority: Low  . PVC (premature ventricular contraction) 05/02/2007    Priority: Low    Medications- reviewed and updated No current facility-administered medications for this visit.    Current Outpatient Prescriptions  Medication Sig Dispense Refill  . acyclovir (ZOVIRAX) 200 MG capsule TAKE 1 CAPSULE DAILY 90 capsule 3  . albuterol (PROVENTIL HFA;VENTOLIN HFA) 108 (90 Base) MCG/ACT inhaler Inhale 2 puffs into the lungs every 6 (six) hours as needed for wheezing or shortness of breath.    Marland Kitchen amLODipine (NORVASC) 10 MG tablet TAKE 1 TABLET DAILY 90 tablet 1  . cyclobenzaprine (FLEXERIL) 10 MG tablet Take 1 tablet (10 mg total) by mouth 3 (three) times daily as needed for muscle spasms. (Patient not taking: Reported on 09/25/2016) 30 tablet 0  . fluticasone (FLONASE) 50 MCG/ACT nasal spray Place 2 sprays into both nostrils daily as needed for allergies  or rhinitis. 32 g 6  . losartan (COZAAR) 100 MG tablet Take 1 tablet (100 mg total) by mouth daily. 90 tablet 3  . omeprazole (PRILOSEC) 20 MG capsule TAKE 1 CAPSULE DAILY 90 capsule 3  . simvastatin (ZOCOR) 20 MG tablet TAKE 1 TABLET AT BEDTIME (Patient taking differently: TAKE 1 TABLET in the AM) 90 tablet 3  . DM-Doxylamine-Acetaminophen (VICKS NYQUIL COLD & FLU) 15-6.25-325 MG CAPS Take 1 capsule by mouth at bedtime as needed (cough and fever).       Objective: BP 102/78 (BP Location: Left Arm, Patient Position: Sitting, Cuff Size: Large)   Pulse 71   Temp 98.6 F (37 C) (Oral)   Ht 5' 6.5" (1.689 m)   Wt 189 lb (85.7 kg)   SpO2 95%   BMI 30.05 kg/m  Gen: NAD, appears fatigued, clammy to touch CV: rapid rhythm in 120s with irregular rate,  no murmurs rubs or gallops Lungs: diffuse rhonchi and wheeze noted. No crackles. Does not appear in respiratory distress.  Abdomen: soft/nontender/nondistended/normal bowel sounds.   Ext: no edema Skin: warm, dry, no rash  EKG: atrial fibrillation with rate of 142, left axis, no pr interval due to a fib, prolonged qrs, normal qt interval, possible mild depression in V2-V4 but may be repolarization.   Assessment/Plan:  Body aches, Fever Plan: POC Influenza A&B(BINAX/QUICKVUE) Irregular heart rate - Plan: EKG 12-Lead New onset atrial fibrillation (HCC) with Atrial fibrillation with RVR (HCC) S: On  Sunday patient noted- burning in throat and congestion nasal. Yesterday significant cough and chest congestion. Fever to 102.0. This morning was 100.5- took tylenol before coming in and now no longer febrile. Has been achy all over. Has experienced chills. Feels like  Sore throat better. Short of breath with steps. Cough mostly clear.  Patient states he feels fatigued and somewhat clammy. Took nyquil last night.   No sinus pressure. No purulent drainage from nose.   A/P:  Due to irregular rapid HR,  did EKG and noted A. FIb with RVR. heart rate on  my exam was 120s vs. Pulse ox reading in 70s (likely not picking up all of ventricular rate). Suspect acute asthma flare likely triggered by viral illness and potential bronchitis with rhonchi--> and that respiratory issues provoked atrial fibrillation as this is new onset.   We considered depomedrol 80mg , albuterol refill, chest x-ray to rule out pneumonia, starting diltiazem. Dierdre Searles, LPN ordered flu due to fever and body aches which was negative. Given his age and how rapid the HR was concerned in how patient would do overnight so wanted him monitored while getting HR down- best setting for this would be ER. May be able to be controlled then sent home but may require admission.   We had called EMS but he declined transport as feels well- he is aware of risks but opts to drive home which is 1 mile away and have wife drive him to hospital. He is to call 911 immediately for worsening symptoms  We verified he was in the ER after visit.   Patient Instructions  Proceed directly to the hospital as soon as you get home. Your heart rate is high and irregular (atrial fibrillation) and they need to work to get this down- as well as have you monitored while working on this or at least be very close to medical care (such as in waiting room)  Immediate ER follow up  Orders Placed This Encounter  Procedures  . POC Influenza A&B(BINAX/QUICKVUE)  . EKG 12-Lead    Order Specific Question:   Where should this test be performed    Answer:   Other   The duration of face-to-face time during this visit was greater than 25 minutes. Greater than 50% of this time was spent in counseling, explanation of diagnosis, planning of further management, and/or coordination of care including discussing my concerns about HR and need for ER evaluation and transport, discussing risks given patient declining transport , discussing atrial fibrillation and high HR significance.   Return precautions advised.  Garret Reddish,  MD

## 2016-09-25 NOTE — ED Notes (Signed)
Unable to get monitor to store and send; EKG strips printed timed 1542/1543 given to Sharon, MD and copies made to have scanned in pt chart; EKG to be done in RES B.

## 2016-09-25 NOTE — Progress Notes (Signed)
ANTICOAGULATION CONSULT NOTE - Initial Consult  Pharmacy Consult for Heparin Indication: atrial fibrillation  Allergies  Allergen Reactions  . Aspirin     REACTION: ULCER @LARGE  DOSES  . Hydrochlorothiazide     Leg cramps  . Irbesartan Other (See Comments)    Headaches   . Symbicort [Budesonide-Formoterol Fumarate] Other (See Comments)    legcramps    Patient Measurements: Height: 5' 6.5" (168.9 cm) Weight: 189 lb (85.7 kg) IBW/kg (Calculated) : 64.95 Heparin Dosing Weight: 83 kg  Vital Signs: Temp: 100.6 F (38.1 C) (07/24 1758) Temp Source: Rectal (07/24 1758) BP: 128/81 (07/24 1900) Pulse Rate: 87 (07/24 1900)  Labs:  Recent Labs  09/25/16 1550 09/25/16 1620  HGB 14.9  --   HCT 43.1  --   PLT 262  --   LABPROT  --  13.7  INR  --  1.05  CREATININE 1.10  --     Estimated Creatinine Clearance: 64.8 mL/min (by C-G formula based on SCr of 1.1 mg/dL).   Medical History: Past Medical History:  Diagnosis Date  . Asthma   . Depression   . ED (erectile dysfunction)   . Hyperlipidemia   . Hypertension   . Solitary pulmonary nodule 04/16/2013   03/30/13  CXR Question left nipple shadow.  10 mm ovoid nodular density right upper lobe, cannot exclude  pulmonary mass/ nodule > f/u cxr 04/29/2013 > no nodule    . Ulcer     Assessment:  71 yr male sent from PCP office with new onset AFib with RVR  Patient on no oral anticoagulation PTA  Pharmacy consulted to begin IV heparin  Goal of Therapy:  Heparin level 0.3-0.7 units/ml Monitor platelets by anticoagulation protocol: Yes   Plan:   Add baseline aPTT onto INR drawn earlier today  Heparin 4000 unit IV bolus x 1 followed by heparin infusion @ 1200 units/hr  Check heparin level 8 hr after heparin started  Follow heparin level and CBC daily while on heparin   Everette Rank, PharmD 09/25/2016,7:26 PM

## 2016-09-25 NOTE — ED Notes (Signed)
ED Provider at bedside. 

## 2016-09-25 NOTE — ED Provider Notes (Signed)
[facility] DEPT Provider Note   CSN: [id number] Arrival date & time: 09/25/16  1414     History   Chief Complaint Chief Complaint  Patient presents with  . Shortness of Breath    HPI Luke [name] is a 71 y.o. male.  The history is provided by the patient and the spouse.  Palpitations   This is a new problem. Episode onset: unsure start. The problem occurs rarely. The problem has not changed since onset.Associated symptoms include a fever, irregular heartbeat, cough, shortness of breath and sputum production. Pertinent negatives include no diaphoresis, no chest pain, no chest pressure, no exertional chest pressure, no syncope, no abdominal pain, no nausea, no vomiting, no back pain, no dizziness and no hemoptysis. He has tried nothing for the symptoms. The treatment provided no relief.    Past Medical History:  Diagnosis Date  . Asthma   . Depression   . ED (erectile dysfunction)   . Hyperlipidemia   . Hypertension   . Solitary pulmonary nodule 04/16/2013   03/30/13  CXR Question left nipple shadow.  10 mm ovoid nodular density right upper lobe, cannot exclude  pulmonary mass/ nodule > f/u cxr 04/29/2013 > no nodule    . Ulcer     Patient Active Problem List   Diagnosis Date Noted  . Genital herpes 02/18/2016  . Hyperglycemia 02/18/2016  . BPH associated with nocturia 02/17/2016  . Left knee pain 02/01/2015  . Restless leg syndrome 03/30/2013  . Allergic rhinitis 10/31/2009  . Headache 10/31/2009  . DEGENERATIVE DISC DISEASE, CERVICAL SPINE, W/RADICULOPATHY 07/28/2008  . Essential hypertension 07/08/2007  . Asthma 07/02/2007  . Hyperlipidemia 05/02/2007  . ERECTILE DYSFUNCTION, MILD 05/02/2007  . PVC (premature ventricular contraction) 05/02/2007  . GERD (gastroesophageal reflux disease) 05/02/2007    History reviewed. No pertinent surgical history.     Home Medications    Prior to Admission medications   Medication Sig Start Date End Date Taking?  Authorizing Provider  acyclovir (ZOVIRAX) 200 MG capsule TAKE 1 CAPSULE DAILY 02/15/15   Dorena Cookey, MD  albuterol (PROVENTIL HFA;VENTOLIN HFA) 108 (90 Base) MCG/ACT inhaler Inhale 2 puffs into the lungs every 6 (six) hours as needed for wheezing or shortness of breath.    [provider]  amLODipine (NORVASC) 10 MG tablet TAKE 1 TABLET DAILY 03/19/16   Marin Olp, MD  amLODipine (NORVASC) 10 MG tablet TAKE 1 TABLET DAILY 08/06/16   Marin Olp, MD  aspirin 81 MG tablet Take 81 mg by mouth daily.    [provider]  cyclobenzaprine (FLEXERIL) 10 MG tablet Take 1 tablet (10 mg total) by mouth 3 (three) times daily as needed for muscle spasms. 08/13/16   Marin Olp, MD  fluticasone (FLONASE) 50 MCG/ACT nasal spray Place 2 sprays into both nostrils daily as needed for allergies or rhinitis. 02/15/15   Dorena Cookey, MD  losartan (COZAAR) 100 MG tablet Take 1 tablet (100 mg total) by mouth daily. 05/01/16   Marin Olp, MD  omeprazole (PRILOSEC) 20 MG capsule Take 1 capsule (20 mg total) by mouth daily. Patient taking differently: Take 20 mg by mouth every other day.  02/15/15   Dorena Cookey, MD  omeprazole (PRILOSEC) 20 MG capsule TAKE 1 CAPSULE DAILY 07/23/16   Dorena Cookey, MD  simvastatin (ZOCOR) 20 MG tablet TAKE 1 TABLET AT BEDTIME 02/16/16   Marin Olp, MD    Family History Family History  Problem Relation Age of  Onset  . Stroke Mother   . Hypertension Mother   . Peripheral vascular disease Mother   . Hyperlipidemia Father   . Coronary artery disease Father   . Peripheral vascular disease Father   . Hypertension Brother   . Coronary artery disease Brother   . Cancer Brother        throat, smoked  . Emphysema Brother        smoked  . Lung cancer Brother        smoked    Social History Social History  Substance Use Topics  . Smoking status: Never Smoker  . Smokeless tobacco: Never Used  . Alcohol use 1.0 oz/week    2  Standard drinks or equivalent per week     Comment: socially occasionally      Allergies   Aspirin; Hydrochlorothiazide; Irbesartan; and Symbicort [budesonide-formoterol fumarate]   Review of Systems Review of Systems  Constitutional: Positive for chills and fever. Negative for appetite change and diaphoresis.  HENT: Positive for congestion and rhinorrhea.   Eyes: Negative for visual disturbance.  Respiratory: Positive for cough, sputum production and shortness of breath. Negative for hemoptysis, chest tightness, wheezing and stridor.   Cardiovascular: Positive for palpitations. Negative for chest pain and syncope.  Gastrointestinal: Negative for abdominal pain, constipation, diarrhea, nausea and vomiting.  Genitourinary: Negative for dysuria, flank pain and frequency.  Musculoskeletal: Negative for back pain.  Skin: Negative for rash and wound.  Neurological: Negative for dizziness and light-headedness.  Psychiatric/Behavioral: Negative for agitation.     Physical Exam Updated Vital Signs BP (!) 144/88 (BP Location: Left Arm)   Pulse 79   Temp 98.1 F (36.7 C) (Oral)   Resp 18   Ht 5' 6.5" (1.689 m)   Wt 85.7 kg (189 lb)   SpO2 96%   BMI 30.05 kg/m   Physical Exam  Constitutional: He appears well-developed and well-nourished. No distress.  HENT:  Head: Normocephalic.  Mouth/Throat: Oropharynx is clear and moist. No oropharyngeal exudate.  Eyes: Pupils are equal, round, and reactive to light. Conjunctivae and EOM are normal.  Neck: Normal range of motion.  Cardiovascular: Intact distal pulses.  An irregularly irregular rhythm present. Tachycardia present.   No murmur heard. Pulmonary/Chest: Breath sounds normal. No stridor. No respiratory distress. He has no wheezes. He has no rales. He exhibits no tenderness.  Abdominal: There is no tenderness.  Musculoskeletal: He exhibits no tenderness.  Neurological: No sensory deficit. He exhibits normal muscle tone.  Skin:  Capillary refill takes less than 2 seconds. He is not diaphoretic. No pallor.  Psychiatric: He has a normal mood and affect.  Nursing note and vitals reviewed.    ED Treatments / Results  Labs (all labs ordered are listed, but only abnormal results are displayed) Labs Reviewed  BASIC METABOLIC PANEL - Abnormal; Notable for the following:       Result Value   Glucose, Bld 109 (*)    All other components within normal limits  CBC - Abnormal; Notable for the following:    WBC 10.6 (*)    All other components within normal limits  LIPID PANEL - Abnormal; Notable for the following:    HDL 25 (*)    All other components within normal limits  BASIC METABOLIC PANEL - Abnormal; Notable for the following:    Calcium 8.7 (*)    All other components within normal limits  MRSA PCR SCREENING  PROTIME-INR  BRAIN NATRIURETIC PEPTIDE  CBC  TROPONIN I  APTT  RAPID URINE DRUG SCREEN, HOSP PERFORMED  HEPARIN LEVEL (UNFRACTIONATED)  HEPARIN LEVEL (UNFRACTIONATED)  TSH  I-STAT TROPONIN, ED  I-STAT CG4 LACTIC ACID, ED    EKG  EKG Interpretation  Date/Time:  Tuesday September 25 2016 15:50:28 EDT Ventricular Rate:  134 PR Interval:    QRS Duration: 137 QT Interval:  328 QTC Calculation: 490 R Axis:   -74 Text Interpretation:  Atrial fibrillation with RVR RBBB and LAFB no prior ECG for comparison.  No STEMI Confirmed by Antony Blackbird [phone]) on 09/25/2016 4:09:33 PM Also confirmed by Antony Blackbird [phone]), editor Hattie Perch [phone])  on 09/26/2016 7:17:24 AM       Radiology Dg Chest 2 View  Result Date: 09/25/2016 CLINICAL DATA:  Onset of chest pain and shortness of breath 3 days ago. History of cardiac dysrhythmia as, asthma, nonsmoker. EXAM: CHEST  2 VIEW COMPARISON:  Chest x-ray of May 24, 2014 FINDINGS: The lungs are well-expanded. The interstitial markings are coarse. There is no alveolar infiltrate, pleural effusion, or pneumothorax. The heart and pulmonary vascularity are  normal. There is calcification in the wall of the aortic arch. There is mild multilevel degenerative disc disease of the thoracic spine. IMPRESSION: Mild interstitial prominence consistent with known reactive airway disease. No pneumonia, CHF, nor other acute cardiopulmonary abnormality. Thoracic aortic atherosclerosis. Electronically Signed   By: David  Martinique M.D.   On: 09/25/2016 16:43    Procedures Procedures (including critical care time)  Medications Ordered in ED Medications  albuterol (PROVENTIL) (2.5 MG/3ML) 0.083% nebulizer solution 2.5 mg (not administered)  amLODipine (NORVASC) tablet 10 mg (10 mg Oral Given 09/26/16 0916)  losartan (COZAAR) tablet 100 mg (100 mg Oral Given 09/26/16 0915)  pantoprazole (PROTONIX) EC tablet 40 mg (40 mg Oral Given 09/26/16 0916)  simvastatin (ZOCOR) tablet 20 mg (20 mg Oral Not Given 09/25/16 2117)  acetaminophen (TYLENOL) tablet 650 mg (not administered)  ondansetron (ZOFRAN) injection 4 mg (4 mg Intravenous Given 09/26/16 1011)  doxycycline (VIBRA-TABS) tablet 100 mg (100 mg Oral Given 09/26/16 0915)  heparin bolus via infusion 4,000 Units (4,000 Units Intravenous Bolus from Bag 09/25/16 2033)    Followed by  heparin ADULT infusion 100 units/mL (25000 units/242mL sodium chloride 0.45%) (1,250 Units/hr Intravenous Rate/Dose Verify 09/26/16 0600)  diltiazem (CARDIZEM) tablet 30 mg (30 mg Oral Given 09/26/16 0915)  sodium chloride 0.9 % bolus 1,000 mL (0 mLs Intravenous Stopped 09/25/16 1750)  diltiazem (CARDIZEM) 1 mg/mL load via infusion 20 mg (20 mg Intravenous Bolus from Bag 09/25/16 1641)    CHA2Ds2-VASc Score for Atrial Fibrillation    Patient Score  Age <65 = 0 65-74 = 1 > 75 = 2 1  Sex Male = 0 Male = 1 0  CHF History No = 0  Yes = 1 0  HTN History No = 0  Yes = 1 1  Stroke/TIA/TE History No = 0  Yes = 1 0  Vascular Disease History No = 0  Yes = 1 0  Diabetes History No = 0  Yes = 1 0  Total:  2   2.2 % stroke rate/year from a  score of 2    Initial Impression / Assessment and Plan / ED Course  I have reviewed the triage vital signs and the nursing notes.  Pertinent labs & imaging results that were available during my care of the patient were reviewed by me and considered in my medical decision making (see chart for details).     Luke [name] is  a 71 y.o. male with a past medical history significant for hypertension, hyperlipidemia, GERD, and remote history of atrial fibrillation not on anticoagulation who presents from his PCP office for tachycardia with arrhythmia. Patient reports that for the last several days, he has had some congestion, fever, and productive cough. He reports that he has not been feeling any chest pain or shortness of breath with his symptoms. He occasionally feels palpitations but does not have a definite time when his tachycardia began. He reports clear production from his cough with no hemoptysis. He reports that chronically he intermittently gets bilateral lower extremity edema when he stands for long period of time but has no history of heart failure. He has no history of DVT or PE. He denies any nausea, vomiting, diaphoresis, urinary symptoms, constipation, or diarrhea. He denies any abdominal pain or back pain. He denies any recent traumatic injuries. He denies any new medications.  Patient reports that he went to see his PCP today for his URI and fever but after was placed onto monitor was found to have a heart rate in the 150s. Patient then had EKG showing concern for atrial fibrillation with RVR and was sent to the ED for evaluation.  On exam, patient has clear lungs. Chest is nontender. Abdomen nontender. Pulses in all joints. No significant lower extremity edema. Back nontender. No CVA tenderness. No focal neurologic deficits. Patient is tachycardic with a regular pulse.  Initial EKG showed atrial fibrillation with RVR.  Patient will have diagnostic workup to look for a pneumonia or  other occult infection as cause of his symptoms. He will also be started on diltiazem to rate control. As patient did not feel they start of this, do not know if patient has had symptoms for greater than 48 hours. Patient is off of anticoagulant.  Anticipate admission for A. fib with RVR and further management.        Final Clinical Impressions(s) / ED Diagnoses   Final diagnoses:  Atrial fibrillation with RVR (HCC)    Clinical Impression: 1. Atrial fibrillation with RVR ([place])      Disposition: Admit to Hospitalist service    Romey Cohea, Gwenyth Allegra, MD 09/26/16 [phone]

## 2016-09-25 NOTE — H&P (Signed)
History and Physical  Luke [name]:[id number] DOB: March 11, 1945 DOA: 09/25/2016  PCP:  Marin Olp, MD   Chief Complaint:  Sent for Afib with RVR  History of Present Illness:  Pt is a 71 yo male with hx of HTN who was sent from PCP office for Afib with RVR. He was in his usual state of health until 3 days ago when he started getting throat burning sensation followed by a fever and chest congestion with cough productive of sputum and muscle aches with watery eyes/nose. He went to see his PCP today and was found to be tachycardic then EKG showed Afib with RVR. Later he said he may have had some palpitations but denied chest pain or dyspnea. No other complaints. He is not sure about a previous history of afib but thinks he was found to have "irratic heart beats" at some point and it "resolved".   Review of Systems:  CONSTITUTIONAL:     No night sweats.  +fatigue.  +fever. No chills. Eyes:                            No visual changes.  No eye pain.  No eye discharge.   ENT:                              No epistaxis.  No sinus pain.  +sore throat.   +congestion. RESPIRATORY:           +cough.  No wheeze.  No hemoptysis.  No dyspnea CARDIOVASCULAR   :  No chest pains.  No palpitations. GASTROINTESTINAL:  No abdominal pain.  No nausea. No vomiting.  No diarrhea. No  constipation.  No hematemesis.  No hematochezia.  No melena. GENITOURINARY:      No urgency.  No frequency.  No dysuria.  No hematuria.  No obstructive symptoms.  No discharge.  No pain.   MUSCULOSKELETAL:  No musculoskeletal pain.  No joint swelling.  No arthritis. NEUROLOGICAL:        No confusion.  No weakness. No headache. No seizure. PSYCHIATRIC:             No depression. No anxiety. No suicidal ideation. SKIN:                             No rashes.  No lesions.  No wounds. ENDOCRINE:                No weight loss.  No polydipsia.  No polyuria.  No polyphagia. HEMATOLOGIC:           No purpura.  No  petechiae.  No bleeding.  ALLERGIC                 : No pruritus.  No angioedema Other:  Past Medical and Surgical History:   Past Medical History:  Diagnosis Date  . Asthma   . Depression   . ED (erectile dysfunction)   . Hyperlipidemia   . Hypertension   . Solitary pulmonary nodule 04/16/2013   03/30/13  CXR Question left nipple shadow.  10 mm ovoid nodular density right upper lobe, cannot exclude  pulmonary mass/ nodule > f/u cxr 04/29/2013 > no nodule    . Ulcer    History reviewed. No pertinent surgical history.  Social History:   reports that he has never  smoked. He has never used smokeless tobacco. He reports that he drinks about 1.0 oz of alcohol per week . He reports that he does not use drugs.   Allergies  Allergen Reactions  . Aspirin     REACTION: ULCER @LARGE  DOSES  . Hydrochlorothiazide     Leg cramps  . Irbesartan Other (See Comments)    Headaches   . Symbicort [Budesonide-Formoterol Fumarate] Other (See Comments)    legcramps    Family History  Problem Relation Age of Onset  . Stroke Mother   . Hypertension Mother   . Peripheral vascular disease Mother   . Hyperlipidemia Father   . Coronary artery disease Father   . Peripheral vascular disease Father   . Hypertension Brother   . Coronary artery disease Brother   . Cancer Brother        throat, smoked  . Emphysema Brother        smoked  . Lung cancer Brother        smoked      Prior to Admission medications   Medication Sig Start Date End Date Taking? Authorizing Provider  acyclovir (ZOVIRAX) 200 MG capsule TAKE 1 CAPSULE DAILY 02/15/15  Yes Dorena Cookey, MD  albuterol (PROVENTIL HFA;VENTOLIN HFA) 108 (90 Base) MCG/ACT inhaler Inhale 2 puffs into the lungs every 6 (six) hours as needed for wheezing or shortness of breath.   Yes [provider]  amLODipine (NORVASC) 10 MG tablet TAKE 1 TABLET DAILY 03/19/16  Yes Marin Olp, MD  DM-Doxylamine-Acetaminophen (VICKS NYQUIL COLD &  FLU) 15-6.25-325 MG CAPS Take 1 capsule by mouth at bedtime as needed (cough and fever).   Yes [provider]  fluticasone (FLONASE) 50 MCG/ACT nasal spray Place 2 sprays into both nostrils daily as needed for allergies or rhinitis. 02/15/15  Yes Dorena Cookey, MD  losartan (COZAAR) 100 MG tablet Take 1 tablet (100 mg total) by mouth daily. 05/01/16  Yes Marin Olp, MD  omeprazole (PRILOSEC) 20 MG capsule TAKE 1 CAPSULE DAILY 07/23/16  Yes Dorena Cookey, MD  simvastatin (ZOCOR) 20 MG tablet TAKE 1 TABLET AT BEDTIME Patient taking differently: TAKE 1 TABLET in the AM 02/16/16  Yes Marin Olp, MD  cyclobenzaprine (FLEXERIL) 10 MG tablet Take 1 tablet (10 mg total) by mouth 3 (three) times daily as needed for muscle spasms. Patient not taking: Reported on 09/25/2016 08/13/16   Marin Olp, MD    Physical Exam: BP 128/81   Pulse 87   Temp (!) 100.6 F (38.1 C) (Rectal)   Resp 15   Ht 5' 6.5" (1.689 m)   Wt 85.7 kg (189 lb)   SpO2 95%   BMI 30.05 kg/m   GENERAL :   Alert and cooperative, and appears to be in no acute distress. HEAD:           normocephalic. EYES:            PERRL, EOMI.  vision is grossly intact. EARS:           hearing grossly intact. NOSE:           No nasal discharge. NECK:          supple CARDIAC:    Normal S1 and S2. No gallop. No murmurs. irregular rhythm  Vascular:     no peripheral edema.  LUNGS:       Clear to auscultation  ABDOMEN: Positive bowel sounds. Soft, nondistended, nontender. No guarding or rebound.  MSK:           No joint erythema or tenderness. EXT           : No significant deformity or joint abnormality. Neuro        : Alert, oriented to person, place, and time.                      CN II-XII intact.  SKIN:            No rash. No lesions. PSYCH:       No hallucination. Patient is not suicidal.          Labs on Admission:  Reviewed.   Radiological Exams on Admission: Dg Chest 2 View  Result Date:  09/25/2016 CLINICAL DATA:  Onset of chest pain and shortness of breath 3 days ago. History of cardiac dysrhythmia as, asthma, nonsmoker. EXAM: CHEST  2 VIEW COMPARISON:  Chest x-ray of May 24, 2014 FINDINGS: The lungs are well-expanded. The interstitial markings are coarse. There is no alveolar infiltrate, pleural effusion, or pneumothorax. The heart and pulmonary vascularity are normal. There is calcification in the wall of the aortic arch. There is mild multilevel degenerative disc disease of the thoracic spine. IMPRESSION: Mild interstitial prominence consistent with known reactive airway disease. No pneumonia, CHF, nor other acute cardiopulmonary abnormality. Thoracic aortic atherosclerosis. Electronically Signed   By: David  Martinique M.D.   On: 09/25/2016 16:43    EKG:  Independently reviewed. Afib with RVR  Assessment/Plan  Atrial fibrillation/flutter: Likely related to underlying hypertensive heart disease and/or CAD. Will check UDS EKG with afib / RVR Echo will be ordered to check for valve disease  New onset is most likely but can't tell if less/more than 48 hours. CHADS2 score : 2 Anticoagulation: will start heparin drip with pharmacy dosing and consult cardiology for new onset afib/ discuss AC on discharge.  Rate/Rythm control: currently on Cardizem drip, will switch to oral in am Rule out MI : serial trops and EKGs.  HTN: cont home medications. May need to switch Norvasc to Cardizem in am.                                                                Check lipid profile in am. Continue zocor.   counseled about AC and stroke risk. Agree with continuing AC. He has hx of bleeding ulcer 20 years ago. No stroke/TIA. No MI/CAD. No recent bleed or surgery.   Input & Output:  NA Lines & Tubes: PIV DVT prophylaxis:  On AC GI prophylaxis: PPI Consultants: Cardio/pharmacy  Code Status: FUll Family Communication: wife at bedside  Disposition Plan: TBD    Gennaro Africa M.D [facility]

## 2016-09-25 NOTE — ED Notes (Signed)
Pt still in atrial fibrillation. Cardizem initiated.

## 2016-09-25 NOTE — Patient Instructions (Signed)
Proceed directly to the hospital as soon as you get home. Your heart rate is high and irregular (atrial fibrillation) and they need to work to get this down- as well as have you monitored while working on this or at least be very close to medical care (such as in waiting room)

## 2016-09-26 ENCOUNTER — Inpatient Hospital Stay (HOSPITAL_COMMUNITY): Payer: Medicare Other

## 2016-09-26 DIAGNOSIS — I4891 Unspecified atrial fibrillation: Secondary | ICD-10-CM | POA: Diagnosis present

## 2016-09-26 DIAGNOSIS — I10 Essential (primary) hypertension: Secondary | ICD-10-CM | POA: Diagnosis present

## 2016-09-26 DIAGNOSIS — E784 Other hyperlipidemia: Secondary | ICD-10-CM

## 2016-09-26 LAB — HEPARIN LEVEL (UNFRACTIONATED)
HEPARIN UNFRACTIONATED: 0.51 [IU]/mL (ref 0.30–0.70)
HEPARIN UNFRACTIONATED: 0.55 [IU]/mL (ref 0.30–0.70)

## 2016-09-26 LAB — LIPID PANEL
Cholesterol: 109 mg/dL (ref 0–200)
HDL: 25 mg/dL — ABNORMAL LOW (ref >40)
LDL CALC: 56 mg/dL (ref 0–99)
Total CHOL/HDL Ratio: 4.4 RATIO
Triglycerides: 139 mg/dL (ref <150)
VLDL: 28 mg/dL (ref 0–40)

## 2016-09-26 LAB — ECHOCARDIOGRAM COMPLETE
HEIGHTINCHES: 66.5 in
Weight: 3024 oz

## 2016-09-26 LAB — MRSA PCR SCREENING: MRSA by PCR: NEGATIVE

## 2016-09-26 LAB — BASIC METABOLIC PANEL
ANION GAP: 9 (ref 5–15)
BUN: 16 mg/dL (ref 6–20)
CHLORIDE: 107 mmol/L (ref 101–111)
CO2: 25 mmol/L (ref 22–32)
Calcium: 8.7 mg/dL — ABNORMAL LOW (ref 8.9–10.3)
Creatinine, Ser: 1.03 mg/dL (ref 0.61–1.24)
Glucose, Bld: 98 mg/dL (ref 65–99)
POTASSIUM: 3.8 mmol/L (ref 3.5–5.1)
SODIUM: 141 mmol/L (ref 135–145)

## 2016-09-26 LAB — CBC
HEMATOCRIT: 39.9 % (ref 39.0–52.0)
HEMOGLOBIN: 13.5 g/dL (ref 13.0–17.0)
MCH: 28.2 pg (ref 26.0–34.0)
MCHC: 33.8 g/dL (ref 30.0–36.0)
MCV: 83.5 fL (ref 78.0–100.0)
Platelets: 233 10*3/uL (ref 150–400)
RBC: 4.78 MIL/uL (ref 4.22–5.81)
RDW: 13.8 % (ref 11.5–15.5)
WBC: 7.6 10*3/uL (ref 4.0–10.5)

## 2016-09-26 LAB — TSH: TSH: 2.438 u[IU]/mL (ref 0.350–4.500)

## 2016-09-26 MED ORDER — ATORVASTATIN CALCIUM 10 MG PO TABS
10.0000 mg | ORAL_TABLET | Freq: Every day | ORAL | Status: DC
  Administered 2016-09-26: 10 mg via ORAL
  Filled 2016-09-26: qty 1

## 2016-09-26 MED ORDER — DILTIAZEM HCL 30 MG PO TABS
30.0000 mg | ORAL_TABLET | Freq: Four times a day (QID) | ORAL | Status: DC
  Administered 2016-09-26 – 2016-09-27 (×4): 30 mg via ORAL
  Filled 2016-09-26 (×4): qty 1

## 2016-09-26 MED ORDER — PERFLUTREN LIPID MICROSPHERE
1.0000 mL | INTRAVENOUS | Status: AC | PRN
  Administered 2016-09-26: 2 mL via INTRAVENOUS
  Filled 2016-09-26: qty 10

## 2016-09-26 MED ORDER — PERFLUTREN LIPID MICROSPHERE
INTRAVENOUS | Status: AC
  Filled 2016-09-26: qty 10

## 2016-09-26 NOTE — Progress Notes (Signed)
  Echocardiogram 2D Echocardiogram has been performed.  Shaine [place] L Androw 09/26/2016, 2:34 PM

## 2016-09-26 NOTE — Progress Notes (Signed)
Patient currently still on Cardizem drip. RN paged MD twice during the night concerning patient's conversion back to NSR and the heart rate. RN questioned PO Cardizem. RN did not get a response from MD. Patient HR monitored and patient remained stable.

## 2016-09-26 NOTE — Care Management Note (Signed)
Case Management Note  Patient Details  Name: RODERICK CALO MRN: [id number] Date of Birth: [birth year]  Subjective/Objective:            A.fib with rvr and iv cardizem drip        Action/Plan: Date:  September 26, 2016 Chart reviewed for concurrent status and case management needs. Will continue to follow patient progress. Discharge Planning: following for needs Expected discharge date: [id number] Velva Harman, BSN, [place], [facility]  Expected Discharge Date:   (unknown)               Expected Discharge Plan:  Home/Self Care  In-House Referral:     Discharge planning Services  CM Consult  Post Acute Care Choice:    Choice offered to:     DME Arranged:    DME Agency:     HH Arranged:    [place] Agency:     Status of Service:  In process, will continue to follow  If discussed at Long Length of Stay Meetings, dates discussed:    Additional Comments:  Leeroy Cha, RN 09/26/2016, 8:51 AM

## 2016-09-26 NOTE — Progress Notes (Signed)
ANTICOAGULATION CONSULT NOTE - Follow Up Consult  Pharmacy Consult for Heparin Indication: atrial fibrillation  Allergies  Allergen Reactions  . Aspirin     REACTION: ULCER @LARGE  DOSES  . Hydrochlorothiazide     Leg cramps  . Irbesartan Other (See Comments)    Headaches   . Symbicort [Budesonide-Formoterol Fumarate] Other (See Comments)    legcramps    Patient Measurements: Height: 5' 6.5" (168.9 cm) Weight: 189 lb (85.7 kg) IBW/kg (Calculated) : 64.95 Heparin Dosing Weight: 83 kg  Vital Signs: Temp: 98.7 F (37.1 C) (07/25 0400) Temp Source: Oral (07/25 0400) BP: 147/76 (07/25 0600) Pulse Rate: 58 (07/25 0600)  Labs:  Recent Labs  09/25/16 1550 09/25/16 1620 09/25/16 2234 09/26/16 0412  HGB 14.9  --   --  13.5  HCT 43.1  --   --  39.9  PLT 262  --   --  233  APTT  --  36  --   --   LABPROT  --  13.7  --   --   INR  --  1.05  --   --   HEPARINUNFRC  --   --   --  0.51  CREATININE 1.10  --   --  1.03  TROPONINI  --   --  <0.03  --     Estimated Creatinine Clearance: 69.2 mL/min (by C-G formula based on SCr of 1.03 mg/dL).   Medications:  Scheduled:  . amLODipine  10 mg Oral Daily  . doxycycline  100 mg Oral Q12H  . losartan  100 mg Oral Daily  . pantoprazole  40 mg Oral Daily  . simvastatin  20 mg Oral QHS   Infusions:  . diltiazem (CARDIZEM) infusion 2.5 mg/hr (09/26/16 0600)  . heparin 1,250 Units/hr (09/26/16 0600)    Assessment: 30 yoM admitted on 7/24 from PCP office for new onset AFib with RVR.  No anticoagulation prior to admission.  Pharmacy is consulted to dose IV heparin.  Today, 09/26/2016: Heparin level 0.55, remains therapeutic in 1250 units/hr CBC: Hgb 13.5, Plt 233 SCr 1.03  Goal of Therapy:  Heparin level 0.3-0.7 units/ml Monitor platelets by anticoagulation protocol: Yes   Plan:  Continue heparin IV infusion at 1250 units/hr Daily heparin level and CBC Continue to monitor H&H and platelets Follow up long-term  anticoagulation plans - Planning transition to Eliquis per [facility].  Gretta Arab PharmD, BCPS Pager [phone] 09/26/2016 1:10 PM

## 2016-09-26 NOTE — Progress Notes (Signed)
PROGRESS NOTE                                                                                                                                                                                                             Patient Demographics:    Luke [name], is a 71 y.o. male, DOB - 02-19-1946, GYF:[id number]  Admit date - 09/25/2016   Admitting Physician Gennaro Africa, MD  Outpatient Primary MD for the patient is Marin Olp, MD  LOS - 1  Outpatient Specialists: none  Chief Complaint  Patient presents with  . Shortness of Breath       Brief Narrative   71 year old male with history of hypertension was sent to the ED by his PCP for new onset rapid A. fib. Patient had URI symptoms 2-3 days prior to admission and even had a fever of 102F at home. Patient admitted to stepdown unit on Cardizem drip.    Subjective:   Report some nasal congestion and nonproductive cough. Has remained in sinus rhythm overnight. Denies further palpitations.   Assessment  & Plan :   Principal problem Atrial fibrillation with RVR ([place]) -New onset. Etiology is uncontrolled hypertension versus recent URI symptoms. Urine drug screen negative. Check TSH. -returned to sinus rhythm on Cardizem drip which has been discontinued this morning. Start oral Cardizem 30 mg every 6 hours and adjust dose as needed. Continue home antihypertensives (amlodipine and losartan). -Check 2-D echo. CHADS2vasc of 2. He would benefit from being on anticoagulation. I discussed various   anticoagulation options and patient prefers to be started on eliquis. Continue him on IV heparin until his heart rate is stable on by mouth Cardizem and echo results obtained (possibly later this afternoon). -I will refer him to outpatient cardiology.  Essential/uncontrolled hypertension Continue amlodipine and losartan. Will adjust Cardizem dose.  URI symptoms On empiric  doxycycline.  Hyperlipidemia Continue statin.   Code Status : Full code  Family Communication  : None at bedside  Disposition Plan  : Transfer to telemetry. Home tomorrow if heart rate stable.  Barriers For Discharge : Improving symptoms  Consults  :  none  Procedures  : 2-D echo  DVT Prophylaxis  : IV heparin  Lab Results  Component Value Date   PLT 233 09/26/2016    Antibiotics  :    Anti-infectives  Start     Dose/Rate Route Frequency Ordered Stop   09/25/16 2200  doxycycline (VIBRA-TABS) tablet 100 mg     100 mg Oral Every 12 hours 09/25/16 1922          Objective:   Vitals:   09/26/16 0900 09/26/16 0915 09/26/16 0916 09/26/16 1000  BP: (!) 156/80 (!) 156/80 (!) 156/80 (!) 170/90  Pulse: 70   66  Resp: 15   16  Temp:      TempSrc:      SpO2: 95%   94%  Weight:      Height:        Wt Readings from Last 3 Encounters:  09/25/16 85.7 kg (189 lb)  09/25/16 85.7 kg (189 lb)  02/17/16 87.3 kg (192 lb 6 oz)     Intake/Output Summary (Last 24 hours) at 09/26/16 1119 Last data filed at 09/26/16 1000  Gross per 24 hour  Intake          1176.46 ml  Output              650 ml  Net           526.46 ml     Physical Exam  Gen: not in distress HEENT: no pallor, moist mucosa, supple neck Chest: clear b/l, no added sounds CVS: N S1&S2, no murmurs, rubs or gallop GI: soft, NT, ND,  Musculoskeletal: warm, no edema     Data Review:    CBC  Recent Labs Lab 09/25/16 1550 09/26/16 0412  WBC 10.6* 7.6  HGB 14.9 13.5  HCT 43.1 39.9  PLT 262 233  MCV 83.2 83.5  MCH 28.8 28.2  MCHC 34.6 33.8  RDW 13.6 13.8    Chemistries   Recent Labs Lab 09/25/16 1550 09/26/16 0412  NA 137 141  K 3.7 3.8  CL 103 107  CO2 22 25  GLUCOSE 109* 98  BUN 16 16  CREATININE 1.10 1.03  CALCIUM 9.0 8.7*   ------------------------------------------------------------------------------------------------------------------  Recent Labs  09/26/16 0412  CHOL  109  HDL 25*  LDLCALC 56  TRIG 139  CHOLHDL 4.4    Lab Results  Component Value Date   HGBA1C 6.0 07/03/2011   ------------------------------------------------------------------------------------------------------------------ No results for input(s): TSH, T4TOTAL, T3FREE, THYROIDAB in the last 72 hours.  Invalid input(s): FREET3 ------------------------------------------------------------------------------------------------------------------ No results for input(s): VITAMINB12, FOLATE, FERRITIN, TIBC, IRON, RETICCTPCT in the last 72 hours.  Coagulation profile  Recent Labs Lab 09/25/16 1620  INR 1.05    No results for input(s): DDIMER in the last 72 hours.  Cardiac Enzymes  Recent Labs Lab 09/25/16 2234  TROPONINI <0.03   ------------------------------------------------------------------------------------------------------------------    Component Value Date/Time   BNP 70.0 09/25/2016 1550    Inpatient Medications  Scheduled Meds: . amLODipine  10 mg Oral Daily  . diltiazem  30 mg Oral Q6H  . doxycycline  100 mg Oral Q12H  . losartan  100 mg Oral Daily  . pantoprazole  40 mg Oral Daily  . simvastatin  20 mg Oral QHS   Continuous Infusions: . heparin 1,250 Units/hr (09/26/16 0600)   PRN Meds:.acetaminophen, albuterol, ondansetron (ZOFRAN) IV  Micro Results Recent Results (from the past 240 hour(s))  MRSA PCR Screening     Status: None   Collection Time: 09/26/16  1:29 AM  Result Value Ref Range Status   MRSA by PCR NEGATIVE NEGATIVE Final    Comment:        The GeneXpert MRSA Assay (FDA approved for NASAL  specimens only), is one component of a comprehensive MRSA colonization surveillance program. It is not intended to diagnose MRSA infection nor to guide or monitor treatment for MRSA infections.     Radiology Reports Dg Chest 2 View  Result Date: 09/25/2016 CLINICAL DATA:  Onset of chest pain and shortness of breath 3 days ago. History of  cardiac dysrhythmia as, asthma, nonsmoker. EXAM: CHEST  2 VIEW COMPARISON:  Chest x-ray of May 24, 2014 FINDINGS: The lungs are well-expanded. The interstitial markings are coarse. There is no alveolar infiltrate, pleural effusion, or pneumothorax. The heart and pulmonary vascularity are normal. There is calcification in the wall of the aortic arch. There is mild multilevel degenerative disc disease of the thoracic spine. IMPRESSION: Mild interstitial prominence consistent with known reactive airway disease. No pneumonia, CHF, nor other acute cardiopulmonary abnormality. Thoracic aortic atherosclerosis. Electronically Signed   By: David  Martinique M.D.   On: 09/25/2016 16:43    Time Spent in minutes  35   Louellen Molder M.D on 09/26/2016 at 11:19 AM  Between 7am to 7pm - Pager - [phone]  After 7pm go to [email or web address] - password [facility]  [facility] -  Office  [phone]

## 2016-09-26 NOTE — Progress Notes (Signed)
[place] for Heparin Indication: atrial fibrillation  Allergies  Allergen Reactions  . Aspirin     REACTION: ULCER @LARGE  DOSES  . Hydrochlorothiazide     Leg cramps  . Irbesartan Other (See Comments)    Headaches   . Symbicort [Budesonide-Formoterol Fumarate] Other (See Comments)    legcramps    Patient Measurements: Height: 5' 6.5" (168.9 cm) Weight: 189 lb (85.7 kg) IBW/kg (Calculated) : 64.95 Heparin Dosing Weight: 83 kg  Vital Signs: Temp: 98.7 F (37.1 C) (07/25 0400) Temp Source: Oral (07/25 0400) BP: 132/58 (07/25 0100) Pulse Rate: 59 (07/25 0100)  Labs:  Recent Labs  09/25/16 1550 09/25/16 1620 09/25/16 2234 09/26/16 0412  HGB 14.9  --   --  13.5  HCT 43.1  --   --  39.9  PLT 262  --   --  233  APTT  --  36  --   --   LABPROT  --  13.7  --   --   INR  --  1.05  --   --   HEPARINUNFRC  --   --   --  0.51  CREATININE 1.10  --   --  1.03  TROPONINI  --   --  <0.03  --     Estimated Creatinine Clearance: 69.2 mL/min (by C-G formula based on SCr of 1.03 mg/dL).  Assessment:  71 yr male sent from PCP office with new onset AFib with RVR  Patient on no oral anticoagulation PTA  Pharmacy consulted to begin IV heparin  First heparin level therapeutic at 0.51 after 4000 unit bolus and drip at 1200 units/hr.  Hg 14.9>13.5. No bleeding reported.   Goal of Therapy:  Heparin level 0.3-0.7 units/ml Monitor platelets by anticoagulation protocol: Yes   Plan:   continue heparin infusion @ 1200 units/hr  Follow heparin level and CBC daily while on heparin   Eudelia Bunch, Pharm.D. [phone] 09/26/2016 5:12 AM

## 2016-09-27 DIAGNOSIS — I4891 Unspecified atrial fibrillation: Principal | ICD-10-CM

## 2016-09-27 DIAGNOSIS — I472 Ventricular tachycardia: Secondary | ICD-10-CM

## 2016-09-27 DIAGNOSIS — E785 Hyperlipidemia, unspecified: Secondary | ICD-10-CM

## 2016-09-27 DIAGNOSIS — I4729 Other ventricular tachycardia: Secondary | ICD-10-CM

## 2016-09-27 DIAGNOSIS — I10 Essential (primary) hypertension: Secondary | ICD-10-CM

## 2016-09-27 LAB — CBC
HCT: 40.4 % (ref 39.0–52.0)
HEMOGLOBIN: 13.8 g/dL (ref 13.0–17.0)
MCH: 28.5 pg (ref 26.0–34.0)
MCHC: 34.2 g/dL (ref 30.0–36.0)
MCV: 83.5 fL (ref 78.0–100.0)
PLATELETS: 233 10*3/uL (ref 150–400)
RBC: 4.84 MIL/uL (ref 4.22–5.81)
RDW: 13.6 % (ref 11.5–15.5)
WBC: 8.1 10*3/uL (ref 4.0–10.5)

## 2016-09-27 LAB — BASIC METABOLIC PANEL
Anion gap: 11 (ref 5–15)
BUN: 18 mg/dL (ref 6–20)
CALCIUM: 8.9 mg/dL (ref 8.9–10.3)
CO2: 22 mmol/L (ref 22–32)
CREATININE: 1.13 mg/dL (ref 0.61–1.24)
Chloride: 107 mmol/L (ref 101–111)
GFR calc Af Amer: >60 mL/min (ref >60)
GLUCOSE: 102 mg/dL — AB (ref 65–99)
Potassium: 3.7 mmol/L (ref 3.5–5.1)
SODIUM: 140 mmol/L (ref 135–145)

## 2016-09-27 LAB — MAGNESIUM: MAGNESIUM: 1.7 mg/dL (ref 1.7–2.4)

## 2016-09-27 LAB — HEPARIN LEVEL (UNFRACTIONATED): HEPARIN UNFRACTIONATED: 0.86 [IU]/mL — AB (ref 0.30–0.70)

## 2016-09-27 MED ORDER — DILTIAZEM HCL ER COATED BEADS 120 MG PO CP24: 120.0000 mg | ORAL_CAPSULE | Freq: Every day | ORAL | 0 refills | Status: DC

## 2016-09-27 MED ORDER — LORATADINE 10 MG PO TABS
10.0000 mg | ORAL_TABLET | Freq: Every day | ORAL | Status: DC
  Administered 2016-09-27: 10 mg via ORAL
  Filled 2016-09-27: qty 1

## 2016-09-27 MED ORDER — APIXABAN 5 MG PO TABS: 5.0000 mg | ORAL_TABLET | Freq: Two times a day (BID) | ORAL | 0 refills | Status: DC

## 2016-09-27 MED ORDER — DOXYCYCLINE HYCLATE 100 MG PO TABS: 100.0000 mg | ORAL_TABLET | Freq: Two times a day (BID) | ORAL | 0 refills | Status: AC

## 2016-09-27 MED ORDER — APIXABAN 5 MG PO TABS
5.0000 mg | ORAL_TABLET | Freq: Two times a day (BID) | ORAL | Status: DC
  Administered 2016-09-27: 5 mg via ORAL
  Filled 2016-09-27: qty 1

## 2016-09-27 MED ORDER — POTASSIUM CHLORIDE CRYS ER 20 MEQ PO TBCR
40.0000 meq | EXTENDED_RELEASE_TABLET | Freq: Once | ORAL | Status: AC
  Administered 2016-09-27: 40 meq via ORAL
  Filled 2016-09-27: qty 2

## 2016-09-27 MED ORDER — ATORVASTATIN CALCIUM 10 MG PO TABS: 10.0000 mg | ORAL_TABLET | Freq: Every day | ORAL | 0 refills | Status: DC

## 2016-09-27 MED ORDER — DILTIAZEM HCL ER COATED BEADS 120 MG PO CP24
120.0000 mg | ORAL_CAPSULE | Freq: Every day | ORAL | Status: DC
  Administered 2016-09-27: 120 mg via ORAL
  Filled 2016-09-27: qty 1

## 2016-09-27 MED ORDER — LORATADINE 10 MG PO TABS: 10.0000 mg | ORAL_TABLET | Freq: Every day | ORAL | 0 refills | Status: DC

## 2016-09-27 MED ORDER — MAGNESIUM SULFATE 4 GM/100ML IV SOLN
4.0000 g | Freq: Once | INTRAVENOUS | Status: AC
  Administered 2016-09-27: 4 g via INTRAVENOUS
  Filled 2016-09-27 (×2): qty 100

## 2016-09-27 NOTE — Progress Notes (Signed)
ANTICOAGULATION CONSULT NOTE - Follow Up Consult  Pharmacy Consult for Heparin Indication: atrial fibrillation  Allergies  Allergen Reactions  . Aspirin     REACTION: ULCER @LARGE  DOSES  . Hydrochlorothiazide     Leg cramps  . Irbesartan Other (See Comments)    Headaches   . Symbicort [Budesonide-Formoterol Fumarate] Other (See Comments)    legcramps    Patient Measurements: Height: 5\' 7"  (170.2 cm) Weight: 188 lb (85.3 kg) IBW/kg (Calculated) : 66.1 Heparin Dosing Weight: 83 kg  Vital Signs: Temp: 98.1 F (36.7 C) (07/26 0531) Temp Source: Oral (07/26 0531) BP: 111/70 (07/26 0531) Pulse Rate: 60 (07/26 0531)  Labs:  Recent Labs  09/25/16 1550 09/25/16 1620 09/25/16 2234 09/26/16 0412 09/26/16 1207 09/27/16 0501  HGB 14.9  --   --  13.5  --  13.8  HCT 43.1  --   --  39.9  --  40.4  PLT 262  --   --  233  --  233  APTT  --  36  --   --   --   --   LABPROT  --  13.7  --   --   --   --   INR  --  1.05  --   --   --   --   HEPARINUNFRC  --   --   --  0.51 0.55 0.86*  CREATININE 1.10  --   --  1.03  --  1.13  TROPONINI  --   --  <0.03  --   --   --     Estimated Creatinine Clearance: 63.5 mL/min (by C-G formula based on SCr of 1.13 mg/dL).   Medications:  Scheduled:  . amLODipine  10 mg Oral Daily  . atorvastatin  10 mg Oral QHS  . diltiazem  30 mg Oral Q6H  . doxycycline  100 mg Oral Q12H  . losartan  100 mg Oral Daily  . pantoprazole  40 mg Oral Daily   Infusions:  . heparin 1,250 Units/hr (09/27/16 7353)    Assessment: 37 yoM admitted on 7/24 from PCP office for new onset AFib with RVR.  No anticoagulation prior to admission.  Pharmacy is consulted to dose IV heparin.  Today, 09/27/2016:  Heparin level 0.86, supra-therapeutic on 1250 units/hr  CBC WNL, stable  No bleeding or infusion related issues per nursing  Goal of Therapy:  Heparin level 0.3-0.7 units/ml Monitor platelets by anticoagulation protocol: Yes   Plan:  Decrease heparin  IV infusion to 1100 units/hr Re-check 6h heparin level Daily heparin level and CBC Follow up long-term anticoagulation plans - Planning transition to Eliquis per [facility].  Netta Cedars, PharmD, BCPS Pager: [phone] 09/27/2016 7:25 AM

## 2016-09-27 NOTE — Discharge Summary (Addendum)
Physician Discharge Summary  Luke [name] JQB:[id number] DOB: 1945/06/08 DOA: 09/25/2016  PCP: Marin Olp, MD  Admit date: 09/25/2016 Discharge date: 09/27/2016  Time spent: 60 minutes  Recommendations for Outpatient Follow-up:  1. Follow-up with Marin Olp, MD in 2 weeks. On follow-up patient will need a basic metabolic profile and magnesium done to follow-up on electrolytes and renal function. 2. Follow-up with cardiology and the A. fib clinic on 10/01/2016.   Discharge Diagnoses:  Principal Problem:   Atrial fibrillation with RVR ([place]) Active Problems:   Hyperlipidemia   Essential hypertension   GERD (gastroesophageal reflux disease)   New onset atrial fibrillation (HCC)   Atrial fibrillation (HCC)   Uncontrolled hypertension   NSVT (nonsustained ventricular tachycardia) ([place])   Discharge Condition: Stable and improved  Diet recommendation: heart healthy.  Filed Weights   09/25/16 1528 09/26/16 1545  Weight: 85.7 kg (189 lb) 85.3 kg (188 lb)    History of present illness:  Per Dr Dreama Saa  Pt is a 71 yo male with hx of HTN who was sent from PCP office for Afib with RVR. He was in his usual state of health until 3 days prior to admission, when he started getting throat burning sensation followed by a fever and chest congestion with cough productive of sputum and muscle aches with watery eyes/nose. He went to see his PCP on the day of admission, and was found to be tachycardic then EKG showed Afib with RVR. Later he said he may have had some palpitations but denied chest pain or dyspnea. No other complaints. He was not sure about a previous history of afib but thinks he was found to have "irratic heart beats" at some point and it "resolved  Hospital Course:  Atrial fibrillation with RVR ([place]) CHA2DS2VASC2 score of 2 -New onset. Etiology is uncontrolled hypertension versus recent URI symptoms. Urine drug screen negative.  - Patient was admitted from PCPs office  with A. fib. Patient placed on a Cardizem drip and initially placed in the step down unit. Cardiac enzymes ordered were negative. TSH obtained was within normal limits at 2.438. - 2-D echo obtained at a year for 55-60% with no wall motion abnormalities, grade 1 diastolic dysfunction. -Patient converted back to to sinus rhythm on Cardizem drip which was subsequently discontinued and patient started on oral Cardizem 30 mg every 6 hours with good rate control. Patient was also maintained on home antihypertensive medications of amlodipine and losartan. and adjust dose as needed. Continue home antihypertensives (amlodipine and losartan). -CHADS2vasc of 2. He would benefit from being on anticoagulation. Dr Clementeen Graham discussed various anticoagulation options and patient preferred to be started on eliquis.  - Patient was initially placed on IV heparin and once patient's heart rate remained controlled on oral Cardizem with 2-D echo results of EF of 55-60% with normal wall motion abnormalities, grade 1 diastolic dysfunction patient was subsequent transition to eliquis 5 mg twice a day. Patient had no bleeding. Patient was very insistent on being discharged even though his oral Cardizem dose was subsequently changed from 30 mg every 6 to daily and patient started on oral eliquis. Patient's heart rate remained controlled and patient remained in sinus rhythm.  Patient will be discharged home in stable and improved condition and is to follow-up with cardiology in outpatient setting in the A. fib clinic.   Essential/uncontrolled hypertension Patient was maintained on home regimen of amlodipine and losartan. Diltiazem was added to patient's regimen secondary to A. fib for better rate  control. Patient's blood pressure remained well-controlled. Outpatient follow-up.   URI symptoms Patient placed on Claritin, empiric doxycycline with clinical improvement. Patient be discharged home on 5 more days of oral doxycycline to  complete a one-week course of treatment.   Hyperlipidemia Patient's statin was changed from Zocor to Lipitor due to possible drug drug interactions as patient had been started on diltiazem. Patient will be discharged on Lipitor with close outpatient follow-up.   Nonsustained V. Tach During the hospitalization patient was noted to have a run of nonsustained V. tach hilar remained asymptomatic. Patient's potassium was noted to be 3.7 and patient given 40 mEq of K 12 by mouth 1. Patient's magnesium noted to be at 1.7 and patient given 4 g of IV magnesium. Patient had no further episodes. Patient was insistent on being discharged. Patient will be discharged in stable condition. Outpatient follow-up.  Procedures:  2-D echo 09/26/2016  Chest x-ray 09/25/2016  Consultations:  None  Discharge Exam: Vitals:   09/27/16 0903 09/27/16 1340  BP: 132/78 139/84  Pulse:    Resp:  18  Temp:  98 F (36.7 C)    General: NAD Cardiovascular: RRR Respiratory: CTAB  Discharge Instructions   Discharge Instructions    Diet - low sodium heart healthy    Complete by:  As directed    Increase activity slowly    Complete by:  As directed      Current Discharge Medication List    START taking these medications   Details  apixaban (ELIQUIS) 5 MG TABS tablet Take 1 tablet (5 mg total) by mouth 2 (two) times daily. Qty: 60 tablet, Refills: 0    atorvastatin (LIPITOR) 10 MG tablet Take 1 tablet (10 mg total) by mouth at bedtime. Qty: 30 tablet, Refills: 0    diltiazem (CARDIZEM CD) 120 MG 24 hr capsule Take 1 capsule (120 mg total) by mouth daily. Qty: 30 capsule, Refills: 0    doxycycline (VIBRA-TABS) 100 MG tablet Take 1 tablet (100 mg total) by mouth every 12 (twelve) hours. Take for 5 days then stop. Qty: 10 tablet, Refills: 0    loratadine (CLARITIN) 10 MG tablet Take 1 tablet (10 mg total) by mouth daily. Qty: 5 tablet, Refills: 0      CONTINUE these medications which have NOT  CHANGED   Details  acyclovir (ZOVIRAX) 200 MG capsule TAKE 1 CAPSULE DAILY Qty: 90 capsule, Refills: 3   Associated Diagnoses: HSV-1 infection    albuterol (PROVENTIL HFA;VENTOLIN HFA) 108 (90 Base) MCG/ACT inhaler Inhale 2 puffs into the lungs every 6 (six) hours as needed for wheezing or shortness of breath.    amLODipine (NORVASC) 10 MG tablet TAKE 1 TABLET DAILY Qty: 90 tablet, Refills: 1    DM-Doxylamine-Acetaminophen (VICKS NYQUIL COLD & FLU) 15-6.25-325 MG CAPS Take 1 capsule by mouth at bedtime as needed (cough and fever).    fluticasone (FLONASE) 50 MCG/ACT nasal spray Place 2 sprays into both nostrils daily as needed for allergies or rhinitis. Qty: 32 g, Refills: 6    losartan (COZAAR) 100 MG tablet Take 1 tablet (100 mg total) by mouth daily. Qty: 90 tablet, Refills: 3    omeprazole (PRILOSEC) 20 MG capsule TAKE 1 CAPSULE DAILY Qty: 90 capsule, Refills: 3      STOP taking these medications     simvastatin (ZOCOR) 20 MG tablet      cyclobenzaprine (FLEXERIL) 10 MG tablet        Allergies  Allergen Reactions  . Aspirin  REACTION: ULCER @LARGE  DOSES  . Hydrochlorothiazide     Leg cramps  . Irbesartan Other (See Comments)    Headaches   . Symbicort [Budesonide-Formoterol Fumarate] Other (See Comments)    legcramps   Follow-up Information    [facility] Follow up on 10/01/2016.   Specialty:  Cardiology Why:  at 9:30AM Contact information: [address] [phone] [place]       Marin Olp, MD. Schedule an appointment as soon as possible for a visit in 2 week(s).   Specialty:  Family Medicine Contact information: [address] [place] [address] [phone]            The results of significant diagnostics from this hospitalization (including imaging, microbiology, ancillary and laboratory) are listed below for reference.    Significant Diagnostic  Studies: Dg Chest 2 View  Result Date: 09/25/2016 CLINICAL DATA:  Onset of chest pain and shortness of breath 3 days ago. History of cardiac dysrhythmia as, asthma, nonsmoker. EXAM: CHEST  2 VIEW COMPARISON:  Chest x-ray of May 24, 2014 FINDINGS: The lungs are well-expanded. The interstitial markings are coarse. There is no alveolar infiltrate, pleural effusion, or pneumothorax. The heart and pulmonary vascularity are normal. There is calcification in the wall of the aortic arch. There is mild multilevel degenerative disc disease of the thoracic spine. IMPRESSION: Mild interstitial prominence consistent with known reactive airway disease. No pneumonia, CHF, nor other acute cardiopulmonary abnormality. Thoracic aortic atherosclerosis. Electronically Signed   By: David  Martinique M.D.   On: 09/25/2016 16:43    Microbiology: Recent Results (from the past 240 hour(s))  MRSA PCR Screening     Status: None   Collection Time: 09/26/16  1:29 AM  Result Value Ref Range Status   MRSA by PCR NEGATIVE NEGATIVE Final    Comment:        The GeneXpert MRSA Assay (FDA approved for NASAL specimens only), is one component of a comprehensive MRSA colonization surveillance program. It is not intended to diagnose MRSA infection nor to guide or monitor treatment for MRSA infections.      Labs: Basic Metabolic Panel:  Recent Labs Lab 09/25/16 1550 09/26/16 0412 09/27/16 0501  NA 137 141 140  K 3.7 3.8 3.7  CL 103 107 107  CO2 22 25 22   GLUCOSE 109* 98 102*  BUN 16 16 18   CREATININE 1.10 1.03 1.13  CALCIUM 9.0 8.7* 8.9  MG  --   --  1.7   Liver Function Tests: No results for input(s): AST, ALT, ALKPHOS, BILITOT, PROT, ALBUMIN in the last 168 hours. No results for input(s): LIPASE, AMYLASE in the last 168 hours. No results for input(s): AMMONIA in the last 168 hours. CBC:  Recent Labs Lab 09/25/16 1550 09/26/16 0412 09/27/16 0501  WBC 10.6* 7.6 8.1  HGB 14.9 13.5 13.8  HCT 43.1 39.9  40.4  MCV 83.2 83.5 83.5  PLT 262 233 233   Cardiac Enzymes:  Recent Labs Lab 09/25/16 2234  TROPONINI <0.03   BNP: BNP (last 3 results)  Recent Labs  09/25/16 1550  BNP 70.0    ProBNP (last 3 results) No results for input(s): PROBNP in the last 8760 hours.  CBG: No results for input(s): GLUCAP in the last 168 hours.     SignedIrine Seal MD.  [facility] 09/27/2016, 5:04 PM

## 2016-09-27 NOTE — Progress Notes (Signed)
Patient discharge home, discharge instructions given and explained to patient/wife and they verbalized understanding, patient denies any pain/distress. Accompanied home by wife, transported to the car by staff via wheelchair.

## 2016-09-27 NOTE — Progress Notes (Signed)
Patient had a 14 beat run of vtach. Patient asymptomatic. Vitals stable. NP on call made aware. New orders placed. Will continue to monitor closely.

## 2016-09-27 NOTE — Discharge Instructions (Signed)
You have an appointment set up with the [facility].  Multiple studies have shown that being followed by a dedicated atrial fibrillation clinic in addition to the standard care you receive from your other physicians improves health. We believe that enrollment in the atrial fibrillation clinic will allow [place] to better care for you.   The phone number to the [facility] is [phone]. The clinic is staffed Monday through Friday from 8:30am to 5pm.  Parking Directions: The clinic is located in the Heart and Vascular Building connected to [facility]. 1)From [address] turn on to [address] and go to the 3rd entrance  (Heart and Vascular entrance) on the right. 2)Look to the right for Heart &Vascular Parking Garage. 3)A code for the entrance is required please call the clinic to receive this.   4)Take the elevators to the 1st floor. Registration is in the room with the glass walls at the end of the hallway.  If you have any trouble parking or locating the clinic, please dont hesitate to call [phone].    Information on my medicine - ELIQUIS (apixaban)  This medication education was reviewed with me or my healthcare representative as part of my discharge preparation.  The pharmacist that spoke with me during my hospital stay was:  Henreitta Leber. PharmD  Why was Eliquis prescribed for you? Eliquis was prescribed for you to reduce the risk of a blood clot forming that can cause a stroke if you have a medical condition called atrial fibrillation (a type of irregular heartbeat).  What do You need to know about Eliquis ? Take your Eliquis TWICE DAILY - one tablet in the morning and one tablet in the evening with or without food. If you have difficulty swallowing the tablet whole please discuss with your pharmacist how to take the medication safely.  Take Eliquis exactly as prescribed by your doctor and DO NOT stop taking Eliquis without talking to  the doctor who prescribed the medication.  Stopping may increase your risk of developing a stroke.  Refill your prescription before you run out.  After discharge, you should have regular check-up appointments with your healthcare provider that is prescribing your Eliquis.  In the future your dose may need to be changed if your kidney function or weight changes by a significant amount or as you get older.  What do you do if you miss a dose? If you miss a dose, take it as soon as you remember on the same day and resume taking twice daily.  Do not take more than one dose of ELIQUIS at the same time to make up a missed dose.  Important Safety Information A possible side effect of Eliquis is bleeding. You should call your healthcare provider right away if you experience any of the following: ? Bleeding from an injury or your nose that does not stop. ? Unusual colored urine (red or dark brown) or unusual colored stools (red or black). ? Unusual bruising for unknown reasons. ? A serious fall or if you hit your head (even if there is no bleeding).  Some medicines may interact with Eliquis and might increase your risk of bleeding or clotting while on Eliquis. To help avoid this, consult your healthcare provider or pharmacist prior to using any new prescription or non-prescription medications, including herbals, vitamins, non-steroidal anti-inflammatory drugs (NSAIDs) and supplements.  This website has more information on Eliquis (apixaban): http://www.eliquis.com/eliquis/home

## 2016-10-01 ENCOUNTER — Ambulatory Visit (INDEPENDENT_AMBULATORY_CARE_PROVIDER_SITE_OTHER): Payer: Medicare Other | Admitting: Family Medicine

## 2016-10-01 ENCOUNTER — Encounter: Payer: Self-pay | Admitting: Family Medicine

## 2016-10-01 ENCOUNTER — Encounter (HOSPITAL_COMMUNITY): Payer: Medicare Other | Admitting: Nurse Practitioner

## 2016-10-01 VITALS — BP 120/78 | HR 75 | Temp 98.4°F | Wt 187.4 lb

## 2016-10-01 DIAGNOSIS — I10 Essential (primary) hypertension: Secondary | ICD-10-CM

## 2016-10-01 DIAGNOSIS — I4891 Unspecified atrial fibrillation: Secondary | ICD-10-CM

## 2016-10-01 LAB — BASIC METABOLIC PANEL
BUN: 21 mg/dL (ref 6–23)
CHLORIDE: 108 meq/L (ref 96–112)
CO2: 23 meq/L (ref 19–32)
CREATININE: 1.03 mg/dL (ref 0.40–1.50)
Calcium: 9.3 mg/dL (ref 8.4–10.5)
GFR: 75.76 mL/min (ref >60.00)
GLUCOSE: 109 mg/dL — AB (ref 70–99)
Potassium: 4.3 mEq/L (ref 3.5–5.1)
Sodium: 140 mEq/L (ref 135–145)

## 2016-10-01 LAB — MAGNESIUM: MAGNESIUM: 1.6 mg/dL (ref 1.5–2.5)

## 2016-10-01 NOTE — Patient Instructions (Signed)
Atrial Fibrillation Atrial fibrillation is a type of irregular or rapid heartbeat (arrhythmia). In atrial fibrillation, the heart quivers continuously in a chaotic pattern. This occurs when parts of the heart receive disorganized signals that make the heart unable to pump blood normally. This can increase the risk for stroke, heart failure, and other heart-related conditions. There are different types of atrial fibrillation, including:  Paroxysmal atrial fibrillation. This type starts suddenly, and it usually stops on its own shortly after it starts.  Persistent atrial fibrillation. This type often lasts longer than a week. It may stop on its own or with treatment.  Long-lasting persistent atrial fibrillation. This type lasts longer than 12 months.  Permanent atrial fibrillation. This type does not go away.  Talk with your health care provider to learn about the type of atrial fibrillation that you have. What are the causes? This condition is caused by some heart-related conditions or procedures, including:  A heart attack.  Coronary artery disease.  Heart failure.  Heart valve conditions.  High blood pressure.  Inflammation of the sac that surrounds the heart (pericarditis).  Heart surgery.  Certain heart rhythm disorders, such as Wolf-Parkinson-White syndrome.  Other causes include:  Pneumonia.  Obstructive sleep apnea.  Blockage of an artery in the lungs (pulmonary embolism, or PE).  Lung cancer.  Chronic lung disease.  Thyroid problems, especially if the thyroid is overactive (hyperthyroidism).  Caffeine.  Excessive alcohol use or illegal drug use.  Use of some medicines, including certain decongestants and diet pills.  Sometimes, the cause cannot be found. What increases the risk? This condition is more likely to develop in:  People who are older in age.  People who smoke.  People who have diabetes mellitus.  People who are overweight  (obese).  Athletes who exercise vigorously.  What are the signs or symptoms? Symptoms of this condition include:  A feeling that your heart is beating rapidly or irregularly.  A feeling of discomfort or pain in your chest.  Shortness of breath.  Sudden light-headedness or weakness.  Getting tired easily during exercise.  In some cases, there are no symptoms. How is this diagnosed? Your health care provider may be able to detect atrial fibrillation when taking your pulse. If detected, this condition may be diagnosed with:  An electrocardiogram (ECG).  A Holter monitor test that records your heartbeat patterns over a 24-hour period.  Transthoracic echocardiogram (TTE) to evaluate how blood flows through your heart.  Transesophageal echocardiogram (TEE) to view more detailed images of your heart.  A stress test.  Imaging tests, such as a CT scan or chest X-ray.  Blood tests.  How is this treated? The main goals of treatment are to prevent blood clots from forming and to keep your heart beating at a normal rate and rhythm. The type of treatment that you receive depends on many factors, such as your underlying medical conditions and how you feel when you are experiencing atrial fibrillation. This condition may be treated with:  Medicine to slow down the heart rate, bring the heart's rhythm back to normal, or prevent clots from forming.  Electrical cardioversion. This is a procedure that resets your heart's rhythm by delivering a controlled, low-energy shock to the heart through your skin.  Different types of ablation, such as catheter ablation, catheter ablation with pacemaker, or surgical ablation. These procedures destroy the heart tissues that send abnormal signals. When the pacemaker is used, it is placed under your skin to help your heart beat in   a regular rhythm.  Follow these instructions at home:  Take over-the counter and prescription medicines only as told by your  health care provider.  If your health care provider prescribed a blood-thinning medicine (anticoagulant), take it exactly as told. Taking too much blood-thinning medicine can cause bleeding. If you do not take enough blood-thinning medicine, you will not have the protection that you need against stroke and other problems.  Do not use tobacco products, including cigarettes, chewing tobacco, and e-cigarettes. If you need help quitting, ask your health care provider.  If you have obstructive sleep apnea, manage your condition as told by your health care provider.  Do not drink alcohol.  Do not drink beverages that contain caffeine, such as coffee, soda, and tea.  Maintain a healthy weight. Do not use diet pills unless your health care provider approves. Diet pills may make heart problems worse.  Follow diet instructions as told by your health care provider.  Exercise regularly as told by your health care provider.  Keep all follow-up visits as told by your health care provider. This is important. How is this prevented?  Avoid drinking beverages that contain caffeine or alcohol.  Avoid certain medicines, especially medicines that are used for breathing problems.  Avoid certain herbs and herbal medicines, such as those that contain ephedra or ginseng.  Do not use illegal drugs, such as cocaine and amphetamines.  Do not smoke.  Manage your high blood pressure. Contact a health care provider if:  You notice a change in the rate, rhythm, or strength of your heartbeat.  You are taking an anticoagulant and you notice increased bruising.  You tire more easily when you exercise or exert yourself. Get help right away if:  You have chest pain, abdominal pain, sweating, or weakness.  You feel nauseous.  You notice blood in your vomit, bowel movement, or urine.  You have shortness of breath.  You suddenly have swollen feet and ankles.  You feel dizzy.  You have sudden weakness or  numbness of the face, arm, or leg, especially on one side of the body.  You have trouble speaking, trouble understanding, or both (aphasia).  Your face or your eyelid droops on one side. These symptoms may represent a serious problem that is an emergency. Do not wait to see if the symptoms will go away. Get medical help right away. Call your local emergency services (911 in the [place] Do not drive yourself to the hospital. This information is not intended to replace advice given to you by your health care provider. Make sure you discuss any questions you have with your health care provider. Document Released: 02/19/2005 Document Revised: 06/29/2015 Document Reviewed: 06/16/2014 Elsevier Interactive Patient Education  2017 [employer].  We will set up referral to atrial fib clinic.

## 2016-10-01 NOTE — Progress Notes (Signed)
Subjective:     Patient ID: Luke [name], male   DOB: 14-May-1945, 71 y.o.   MRN: [id number]  HPI Patient seen for hospital follow-up in absence of his primary who is out of town this week. He presented here last week with upper respiratory symptoms and was noted to be in atrial fibrillation with rapid ventricular response. His heart rate was around 140. He was admitted for further evaluation. He related fever, chest congestion and cough for a few days prior to admission. No prior history of atrial fibrillation. No recent chest pain.   Patent ruled out for MI. Was placed initially on Cardizem drip and then transitioned to oral Cardizem. TSH normal. Echocardiogram ejection fraction 55-60% with no wall motion abnormalities.  Patient converted to sinus rhythm on Cardizem drip. He was sent home on oral Cardizem in addition to his amlodipine and losartan. He had CHADS2Vasc score of 2.  Started on Eliquis 5 mg twice daily. Patient was scheduled to follow-up in A fib clinic but apparently canceled that appointment.  Upper respiratory symptoms have improved. He was changed from simvastatin to Lipitor to avoid potential drug interaction with diltiazem. There is also notation that patient had episode of nonsustained V. tach. Potassium borderline low 3.7 with magnesium 1.7. He was given supplement with potassium and magnesium and recommendation for outpatient follow-up.  Feels well this time. No chest pains. No dyspnea. No dizziness.  Past Medical History:  Diagnosis Date  . Asthma   . Depression   . ED (erectile dysfunction)   . Hyperlipidemia   . Hypertension   . Solitary pulmonary nodule 04/16/2013   03/30/13  CXR Question left nipple shadow.  10 mm ovoid nodular density right upper lobe, cannot exclude  pulmonary mass/ nodule > f/u cxr 04/29/2013 > no nodule    . Ulcer    No past surgical history on file.  reports that he has never smoked. He has never used smokeless tobacco. He reports that he  drinks about 1.0 oz of alcohol per week . He reports that he does not use drugs. family history includes Cancer in his brother; Coronary artery disease in his brother and father; Emphysema in his brother; Hyperlipidemia in his father; Hypertension in his brother and mother; Lung cancer in his brother; Peripheral vascular disease in his father and mother; Stroke in his mother. Allergies  Allergen Reactions  . Aspirin     REACTION: ULCER @LARGE  DOSES  . Hydrochlorothiazide     Leg cramps  . Irbesartan Other (See Comments)    Headaches   . Symbicort [Budesonide-Formoterol Fumarate] Other (See Comments)    legcramps     Review of Systems  Constitutional: Negative for fatigue.  Eyes: Negative for visual disturbance.  Respiratory: Negative for cough, chest tightness and shortness of breath.   Cardiovascular: Negative for chest pain, palpitations and leg swelling.  Neurological: Negative for dizziness, syncope, weakness, light-headedness and headaches.       Objective:   Physical Exam  Constitutional: He is oriented to person, place, and time. He appears well-developed and well-nourished.  HENT:  Right Ear: External ear normal.  Left Ear: External ear normal.  Mouth/Throat: Oropharynx is clear and moist.  Eyes: Pupils are equal, round, and reactive to light.  Neck: Neck supple. No thyromegaly present.  Cardiovascular: Normal rate and regular rhythm.  Exam reveals no gallop.   Pulmonary/Chest: Effort normal and breath sounds normal. No respiratory distress. He has no wheezes. He has no rales.  Musculoskeletal: He exhibits  no edema.  Neurological: He is alert and oriented to person, place, and time.       Assessment:     #1 New onset atrial fibrillation with rapid ventricular response.  Patient appears to be in sinus rhythm today  #2 hypertension currently well controlled    Plan:     -Check basic metabolic panel and magnesium -Patient is encouraged to follow through with  appointment with atrial fibrillation clinic -Continue eliquis 5 mg twice a day and Diltiazem.  Eulas Post MD [facility]

## 2016-10-02 ENCOUNTER — Telehealth: Payer: Self-pay | Admitting: Family Medicine

## 2016-10-02 NOTE — Telephone Encounter (Signed)
Pt called back and stated that he will call back when ready to schedule.

## 2016-10-02 NOTE — Telephone Encounter (Signed)
Called pt and we got disconnect will try back later.

## 2016-10-02 NOTE — Telephone Encounter (Signed)
Yes I can see both of them, thanks

## 2016-10-02 NOTE — Telephone Encounter (Signed)
Left message on machine returning patient's call 

## 2016-10-02 NOTE — Telephone Encounter (Signed)
Luke [name] pt would like to have a call on a personal matter.

## 2016-10-02 NOTE — Telephone Encounter (Signed)
Pt would like to see if Dr. Sarajane Jews would accept him and his wife they do not want to transfer to [place].  Is it okay to transfer to Dr. Sarajane Jews?

## 2016-10-03 HISTORY — PX: NM MYOVIEW LTD: HXRAD82

## 2016-10-04 NOTE — Telephone Encounter (Signed)
Thanks for asking- no problems here. Great guy- recently diagnosed with atrial fibrillation.   [name]- please tell him its been an honor to have him as a patient and that we will miss him.

## 2016-10-04 NOTE — Telephone Encounter (Signed)
Called patient and let him know that it has been an honor and that he will be in great hands with Dr. Sarajane Jews. He said he appreciated everything we have done for him.

## 2016-10-05 ENCOUNTER — Ambulatory Visit (INDEPENDENT_AMBULATORY_CARE_PROVIDER_SITE_OTHER): Payer: Medicare Other | Admitting: Cardiology

## 2016-10-05 ENCOUNTER — Encounter: Payer: Self-pay | Admitting: Cardiology

## 2016-10-05 VITALS — BP 147/86 | HR 65 | Ht 67.0 in | Wt 187.6 lb

## 2016-10-05 DIAGNOSIS — R0609 Other forms of dyspnea: Secondary | ICD-10-CM

## 2016-10-05 DIAGNOSIS — E7849 Other hyperlipidemia: Secondary | ICD-10-CM

## 2016-10-05 DIAGNOSIS — I472 Ventricular tachycardia: Secondary | ICD-10-CM | POA: Diagnosis not present

## 2016-10-05 DIAGNOSIS — R739 Hyperglycemia, unspecified: Secondary | ICD-10-CM

## 2016-10-05 DIAGNOSIS — E784 Other hyperlipidemia: Secondary | ICD-10-CM

## 2016-10-05 DIAGNOSIS — I4891 Unspecified atrial fibrillation: Secondary | ICD-10-CM

## 2016-10-05 DIAGNOSIS — I10 Essential (primary) hypertension: Secondary | ICD-10-CM

## 2016-10-05 DIAGNOSIS — I4729 Other ventricular tachycardia: Secondary | ICD-10-CM

## 2016-10-05 MED ORDER — APIXABAN 5 MG PO TABS: 5.0000 mg | ORAL_TABLET | Freq: Two times a day (BID) | ORAL | 3 refills | Status: DC

## 2016-10-05 MED ORDER — METOPROLOL SUCCINATE ER 25 MG PO TB24: 25.0000 mg | ORAL_TABLET | Freq: Every day | ORAL | 6 refills | Status: DC

## 2016-10-05 MED ORDER — ATORVASTATIN CALCIUM 10 MG PO TABS: 10.0000 mg | ORAL_TABLET | Freq: Every day | ORAL | 3 refills | Status: DC

## 2016-10-05 NOTE — Progress Notes (Signed)
PCP: Laurey Morale, MD  Clinic Note: Chief Complaint  Patient presents with  . New Patient (Initial Visit)    New diagnosis of atrial fibrillation    HPI: Luke [name] is a 71 y.o. male who is being seen today for the evaluation of Atrial fibrillation at the request of Burchette, Alinda Sierras, MD.  He has PMH notable for HTN &HLD who was recently diagnosed with PAF in the setting of an acute URI. Prior to this recent hospitalization, he had never been diagnosed with Afib -- he states that "he may have had some irratic heart beats, at one time or another  He went into the emergency room on July 24 for URI symptoms and was found to be in A. fib with RVR. 140s. He ruled out for MI and was placed on Cardizem drip. On ELIQUIS 5 mg daily. Echocardiogram done  Luke [name] was seen on 09/25/2016 by Dr. Yong Channel ([facility] [place]) - he presented with fever chills cough and congestion as well as some wheezing and shortness of breath climbing stairs. On exam he was noted to be in A. fib with RVR (new onset A. fib) rates were in the 120s. He also was noted to have diffuse rhonchi and wheezing as well as hypoxia on exam with saturations in the 70s. --> This was thought to be due to a viral URI leading to asthma exacerbation, complicated by bronchitis.  He was admitted to the hospital after being treated with steroids and antibiotics in the clinic. He was offered transport by EMS, but chose to drive himself.  He was given IV Diltiazem upon arrival to the ER.  Recent Hospitalizations: 7/24 - 7/26: He was placed on a diltiazem drip and initially had rate control followed by spontaneous conversion. He was converted to oral diltiazem on discharge however his amlodipine was also continued. With a CHA2DS2Vasc of 2, he was started on ELIQUIS coagulation. He was treated with doxycycline as well as Claritin after prednisone taper. His statin was converted from Zocor to Lipitor and to avoid  interaction with calcium channel blocker. On telemetry had at least one short episode of nonsustained ventricular tachycardia. He did rule out for an MI and TSH was normal. Plan was for follow-up in the A. fib chronic but that appointment was apparently canceled.  Studies Personally Reviewed - (if available, images/films reviewed: From Epic Chart or Care Everywhere)  2 D Echo September 27, 7251:  Normal systolic function. EF 5560%. No RWMA. Gr 1 DD. Mild AoV calcification - no stenosis. Atrial Septum - lipomatous hypertrophy. --Essentially normal.  Interval History: Since his discharge from the hospital, he then saw Dr. Elease Hashimoto, and was feeling much better. His URI symptoms have resolved and he did not have any further sensations of rapid irregular heartbeats or palpitations. He now presents today again feeling well without any major complaints. He is not had any further sensation of rapid irregular heartbeats or palpitations. He really wouldn't know what the A. fib would've felt like otherwise, because he is more concerned with his coughing and fever/chills symptoms symptoms. He does say he may feel occasional palpitations off and on but nothing stained are prolonged. He denied at the time and currently denies any chest tightness pressure with rest or exertion. He now is being deconditioned and mildly short of breath, denies any significant exertional dyspnea, PND, orthopnea or edema.  No  lightheadedness, dizziness, weakness or syncope/near syncope. No TIA/amaurosis fugax symptoms. No melena, hematochezia, hematuria,  or epstaxis. No claudication.  ROS: A comprehensive was performed. Review of Systems  Constitutional: Negative for chills, fever and malaise/fatigue.       Regaining strength from recent URI  HENT: Negative for congestion.   Respiratory: Positive for cough (resolving URI Sx). Negative for shortness of breath and wheezing.   Cardiovascular:       Per HPI  Gastrointestinal: Negative  for abdominal pain, constipation, diarrhea, heartburn and nausea.  Genitourinary: Negative for dysuria and urgency.  Musculoskeletal: Positive for joint pain (OA pain - knees / hips).  Skin: Negative.   Neurological: Positive for dizziness. Negative for focal weakness and loss of consciousness.  Endo/Heme/Allergies: Does not bruise/bleed easily.  Psychiatric/Behavioral: The patient is not nervous/anxious and does not have insomnia.   All other systems reviewed and are negative.   I have reviewed and (if needed) personally updated the patient's problem list, medications, allergies, past medical and surgical history, social and family history.   Past Medical History:  Diagnosis Date  . Asthma   . Depression   . ED (erectile dysfunction)   . Hyperlipidemia   . Hypertension   . Paroxysmal atrial fibrillation ([place]) 09/2016   Initial diagnosis was in setting of acute URI/asthma attack  . Solitary pulmonary nodule 04/16/2013   03/30/13  CXR Question left nipple shadow.  10 mm ovoid nodular density right upper lobe, cannot exclude  pulmonary mass/ nodule > f/u cxr 04/29/2013 > no nodule    . Ulcer     Past Surgical History:  Procedure Laterality Date  . TRANSTHORACIC ECHOCARDIOGRAM  94/1740   Normal systolic function. EF 5560%. No RWMA. Gr 1 DD. Mild AoV calcification - no stenosis. Atrial Septum - lipomatous hypertrophy. --Essentially normal.    Current Meds  Medication Sig  . acyclovir (ZOVIRAX) 200 MG capsule TAKE 1 CAPSULE DAILY  . albuterol (PROVENTIL HFA;VENTOLIN HFA) 108 (90 Base) MCG/ACT inhaler Inhale 2 puffs into the lungs every 6 (six) hours as needed for wheezing or shortness of breath.  Marland Kitchen amLODipine (NORVASC) 10 MG tablet TAKE 1 TABLET DAILY  . apixaban (ELIQUIS) 5 MG TABS tablet Take 1 tablet (5 mg total) by mouth 2 (two) times daily.  Marland Kitchen atorvastatin (LIPITOR) 10 MG tablet Take 1 tablet (10 mg total) by mouth at bedtime.  . fluticasone (FLONASE) 50 MCG/ACT nasal spray Place  2 sprays into both nostrils daily as needed for allergies or rhinitis.  Marland Kitchen losartan (COZAAR) 100 MG tablet Take 1 tablet (100 mg total) by mouth daily.  Marland Kitchen omeprazole (PRILOSEC) 20 MG capsule TAKE 1 CAPSULE DAILY  . [DISCONTINUED] apixaban (ELIQUIS) 5 MG TABS tablet Take 1 tablet (5 mg total) by mouth 2 (two) times daily.  . [DISCONTINUED] atorvastatin (LIPITOR) 10 MG tablet Take 1 tablet (10 mg total) by mouth at bedtime.  . [DISCONTINUED] diltiazem (CARDIZEM CD) 120 MG 24 hr capsule Take 1 capsule (120 mg total) by mouth daily.  . [DISCONTINUED] DM-Doxylamine-Acetaminophen (VICKS NYQUIL COLD & FLU) 15-6.25-325 MG CAPS Take 1 capsule by mouth at bedtime as needed (cough and fever).    Allergies  Allergen Reactions  . Aspirin     REACTION: ULCER @LARGE  DOSES  . Hydrochlorothiazide     Leg cramps  . Irbesartan Other (See Comments)    Headaches   . Symbicort [Budesonide-Formoterol Fumarate] Other (See Comments)    legcramps    Social History   Social History  . Marital status: Married    Spouse name: N/A  . Number of children: N/A  .  Years of education: N/A   Occupational History  . Retired Pharmacist, hospital Retired   Social History Main Topics  . Smoking status: Never Smoker  . Smokeless tobacco: Never Used  . Alcohol use 1.0 oz/week    2 Standard drinks or equivalent per week     Comment: socially occasionally   . Drug use: No  . Sexual activity: Not Asked   Other Topics Concern  . None   Social History Narrative  . None    family history includes Cancer in his brother; Coronary artery disease in his brother and father; Emphysema in his brother; Hyperlipidemia in his father; Hypertension in his brother and mother; Lung cancer in his brother; Peripheral vascular disease in his father and mother; Stroke in his mother.  Wt Readings from Last 3 Encounters:  10/05/16 187 lb 9.6 oz (85.1 kg)  10/01/16 187 lb 6.4 oz (85 kg)  09/26/16 188 lb (85.3 kg)    PHYSICAL EXAM BP (!)  147/86   Pulse 65   Ht 5\' 7"  (1.702 m)   Wt 187 lb 9.6 oz (85.1 kg)   BMI 29.38 kg/m  Physical Exam  Constitutional: He is oriented to person, place, and time. He appears well-developed and well-nourished. He appears distressed.  Borderline obese (truncal)  HENT:  Head: Normocephalic and atraumatic.  Nose: Nose normal.  Mouth/Throat: Oropharynx is clear and moist. No oropharyngeal exudate.  Eyes: Pupils are equal, round, and reactive to light. Conjunctivae and EOM are normal. No scleral icterus.  Neck: Normal range of motion. Neck supple. No hepatojugular reflux and no JVD present. Carotid bruit is not present. No tracheal deviation present. No thyromegaly present.  Cardiovascular: Normal rate, regular rhythm, normal heart sounds and intact distal pulses.  Exam reveals no gallop and no friction rub.   No murmur heard. Pulmonary/Chest: Effort normal and breath sounds normal. No respiratory distress. He has no wheezes. He has no rales.  Abdominal: Soft. Bowel sounds are normal. He exhibits no distension. There is no tenderness. There is no rebound.  Musculoskeletal: Normal range of motion. He exhibits no edema or deformity.  Neurological: He is alert and oriented to person, place, and time. No cranial nerve deficit. Coordination normal.  Skin: Skin is warm and dry. No rash noted. No erythema. No pallor.  Psychiatric: He has a normal mood and affect. His behavior is normal. Thought content normal.  Nursing note and vitals reviewed.   Adult ECG Report  Rate: 61 ;  Rhythm: normal sinus rhythm and Normal axis, intervals and durations. RBBB.;   Narrative Interpretation: Sinus rhythm has replaced A. fib   Other studies Reviewed: Additional studies/ records that were reviewed today include:  Recent Labs:   Lab Results  Component Value Date   CREATININE 1.03 10/01/2016   BUN 21 10/01/2016   NA 140 10/01/2016   K 4.3 10/01/2016   CL 108 10/01/2016   CO2 23 10/01/2016   Lab Results    Component Value Date   CHOL 109 09/26/2016   HDL 25 (L) 09/26/2016   LDLCALC 56 09/26/2016   LDLDIRECT 73.0 02/08/2015   TRIG 139 09/26/2016   CHOLHDL 4.4 09/26/2016    ASSESSMENT / PLAN: Problem List Items Addressed This Visit    Essential hypertension (Chronic)    Blood pressure is slightly high today. He is on amlodipine already along with max dose losartan. Plan will be to convert from diltiazem to Toprol. This will also have a better blood pressure effect for him. He brings  with him a list of blood pressure recordings ranging mostly in the 140s / 80s.  We'll need to reassess what his blood pressure looks like on Toprol. Heart rates were in the 60s to low 70s. Bradycardia may preclude further titration of Toprol.      Relevant Medications   metoprolol succinate (TOPROL XL) 25 MG 24 hr tablet   apixaban (ELIQUIS) 5 MG TABS tablet   atorvastatin (LIPITOR) 10 MG tablet   Hyperglycemia (Chronic)    Presence of hypertension, hyperlipidemia with low LDL along with hyperglycemia and borderline obesity essentially makes the diagnosis of metabolic syndrome.  This combination makes higher risk for CAD and therefore with new onset A. fib as well as nonsustained VT, we need to exclude ischemic CAD with a Myoview stress test.      Hyperlipidemia    Converted from simvastatin to atorvastatin in the hospital. This is being followed by PCP.      Relevant Medications   metoprolol succinate (TOPROL XL) 25 MG 24 hr tablet   apixaban (ELIQUIS) 5 MG TABS tablet   atorvastatin (LIPITOR) 10 MG tablet   New onset atrial fibrillation ([place])    This is a new diagnosis of A. fib in a gentleman with risk factors of hypertension and hyperlipidemia as well as family history of CAD. He did have some dyspnea associated with being in A. fib, and we cannot exclude the potential of underlying ischemic CAD leading to the URI triggering A. Fib.  He also had an episode of nonsustained atrial tachycardia on  the hospital. This increases the desired for ischemic evaluation as his echocardiogram was otherwise normal.  Plan:   Ischemic evaluation Myoview stress test. If he is able to ambulate, would be best to try to do an ambulatory Myoview stress test on treadmill however he does have some arthritis pains, we could convert to [place] if necessary.  I will convert from diltiazem to XL 25 mg daily for rate control. Vitamin he has no recurrence, I think rate control is probably the best option.  I explained to him the pathophysiology of atrial fibrillation and the need for anticoagulation. One major benefit of being on full-time anticoagulation as the potential for outpatient cardioversion if he were to have recurrence of A. Fib --> continue ELIQUIS for now.      Relevant Medications   metoprolol succinate (TOPROL XL) 25 MG 24 hr tablet   apixaban (ELIQUIS) 5 MG TABS tablet   atorvastatin (LIPITOR) 10 MG tablet   Other Relevant Orders   Myocardial Perfusion Imaging   NSVT (nonsustained ventricular tachycardia) ([place])    This was in the setting of acute illness in A. fib RVR. He was a symptomatically and his echocardiogram is essentially normal.  Given his CAD risk factors of age, hypertension and hyperlipidemia as well as family history,  wewill evaluate for ischemic CAD with a Myoview stress test.      Relevant Medications   metoprolol succinate (TOPROL XL) 25 MG 24 hr tablet   apixaban (ELIQUIS) 5 MG TABS tablet   atorvastatin (LIPITOR) 10 MG tablet   Other Relevant Orders   Myocardial Perfusion Imaging    Other Visit Diagnoses    New onset a-fib ([place])    -  Primary   Relevant Medications   metoprolol succinate (TOPROL XL) 25 MG 24 hr tablet   apixaban (ELIQUIS) 5 MG TABS tablet   atorvastatin (LIPITOR) 10 MG tablet   Other Relevant Orders   EKG 12-Lead  Myocardial Perfusion Imaging   DOE (dyspnea on exertion)       Relevant Orders   Myocardial Perfusion Imaging      Current  medicines are reviewed at length with the patient today. (+/- concerns) ? Why was his statin changed & PCP indicated concern re: 2 CCB meds. The following changes have been made: -- I explained that the statin changes may do to desire to avoid interaction between simvastatin and a calcium channel blocker. I also agreed with him that to calcium channel blockers does not actually make sense we'll convert to beta blocker.  Patient Instructions  Schedule at [address]   Your physician has requested that you have en exercise stress myoview. For further information please visit [email or web address]. Please follow instruction sheet, as given.   MEDICATIONS  STOP DILTIAZEM   START METOPROLOL SUCCINATE ( TOPROL XL) 25 MG  AFTER DINNER.  Your physician recommends that you schedule a follow-up appointment in [place].    Studies Ordered:   Orders Placed This Encounter  Procedures  . Myocardial Perfusion Imaging  . EKG 12-Lead      Glenetta Hew, M.D., M.S. Interventional Cardiologist   Pager # [phone] Phone # [phone] [address] [place], [place] [address]

## 2016-10-05 NOTE — Patient Instructions (Addendum)
Schedule at [address]   Your physician has requested that you have en exercise stress myoview. For further information please visit [email or web address]. Please follow instruction sheet, as given.   MEDICATIONS  STOP DILTIAZEM   START METOPROLOL SUCCINATE ( TOPROL XL) 25 MG  AFTER DINNER.  Your physician recommends that you schedule a follow-up appointment in [place].

## 2016-10-07 ENCOUNTER — Encounter: Payer: Self-pay | Admitting: Cardiology

## 2016-10-07 NOTE — Assessment & Plan Note (Signed)
Blood pressure is slightly high today. He is on amlodipine already along with max dose losartan. Plan will be to convert from diltiazem to Toprol. This will also have a better blood pressure effect for him. He brings with him a list of blood pressure recordings ranging mostly in the 140s / 80s.  We'll need to reassess what his blood pressure looks like on Toprol. Heart rates were in the 60s to low 70s. Bradycardia may preclude further titration of Toprol.

## 2016-10-07 NOTE — Assessment & Plan Note (Signed)
Converted from simvastatin to atorvastatin in the hospital. This is being followed by PCP.

## 2016-10-07 NOTE — Assessment & Plan Note (Signed)
Presence of hypertension, hyperlipidemia with low LDL along with hyperglycemia and borderline obesity essentially makes the diagnosis of metabolic syndrome.  This combination makes higher risk for CAD and therefore with new onset A. fib as well as nonsustained VT, we need to exclude ischemic CAD with a Myoview stress test.

## 2016-10-07 NOTE — Assessment & Plan Note (Signed)
This was in the setting of acute illness in A. fib RVR. He was a symptomatically and his echocardiogram is essentially normal.  Given his CAD risk factors of age, hypertension and hyperlipidemia as well as family history,  wewill evaluate for ischemic CAD with a Myoview stress test.

## 2016-10-07 NOTE — Assessment & Plan Note (Addendum)
This is a new diagnosis of A. fib in a gentleman with risk factors of hypertension and hyperlipidemia as well as family history of CAD. He did have some dyspnea associated with being in A. fib, and we cannot exclude the potential of underlying ischemic CAD leading to the URI triggering A. Fib.  He also had an episode of nonsustained atrial tachycardia on the hospital. This increases the desired for ischemic evaluation as his echocardiogram was otherwise normal.  Plan:   Ischemic evaluation Myoview stress test. If he is able to ambulate, would be best to try to do an ambulatory Myoview stress test on treadmill however he does have some arthritis pains, we could convert to [place] if necessary.  I will convert from diltiazem to XL 25 mg daily for rate control. Vitamin he has no recurrence, I think rate control is probably the best option.  I explained to him the pathophysiology of atrial fibrillation and the need for anticoagulation. One major benefit of being on full-time anticoagulation as the potential for outpatient cardioversion if he were to have recurrence of A. Fib --> continue ELIQUIS for now.

## 2016-10-08 ENCOUNTER — Ambulatory Visit: Payer: Medicare Other | Admitting: Family Medicine

## 2016-10-12 ENCOUNTER — Telehealth (HOSPITAL_COMMUNITY): Payer: Self-pay

## 2016-10-12 NOTE — Telephone Encounter (Signed)
Encounter complete. 

## 2016-10-17 ENCOUNTER — Ambulatory Visit (HOSPITAL_COMMUNITY)
Admission: RE | Admit: 2016-10-17 | Discharge: 2016-10-17 | Disposition: A | Discharge status: Home / Self Care | Payer: Medicare Other | Source: Ambulatory Visit | Attending: Cardiovascular Disease | Admitting: Cardiovascular Disease

## 2016-10-17 DIAGNOSIS — I4891 Unspecified atrial fibrillation: Secondary | ICD-10-CM | POA: Diagnosis not present

## 2016-10-17 DIAGNOSIS — I472 Ventricular tachycardia: Secondary | ICD-10-CM

## 2016-10-17 DIAGNOSIS — I4729 Other ventricular tachycardia: Secondary | ICD-10-CM

## 2016-10-17 DIAGNOSIS — R0609 Other forms of dyspnea: Secondary | ICD-10-CM | POA: Insufficient documentation

## 2016-10-17 LAB — MYOCARDIAL PERFUSION IMAGING
CHL CUP NUCLEAR SDS: 5
CHL CUP NUCLEAR SRS: 6
CHL CUP NUCLEAR SSS: 11
CSEPPHR: 110 {beats}/min
LV dias vol: 98 mL (ref 62–150)
LV sys vol: 38 mL
Rest HR: 56 {beats}/min
TID: 1.22

## 2016-10-17 MED ORDER — REGADENOSON 0.4 MG/5ML IV SOLN
0.4000 mg | Freq: Once | INTRAVENOUS | Status: AC
  Administered 2016-10-17: 0.4 mg via INTRAVENOUS

## 2016-10-17 MED ORDER — TECHNETIUM TC 99M TETROFOSMIN IV KIT
30.5000 | PACK | Freq: Once | INTRAVENOUS | Status: AC | PRN
  Administered 2016-10-17: 30.5 via INTRAVENOUS
  Filled 2016-10-17: qty 31

## 2016-10-17 MED ORDER — TECHNETIUM TC 99M TETROFOSMIN IV KIT
10.2000 | PACK | Freq: Once | INTRAVENOUS | Status: AC | PRN
  Administered 2016-10-17: 10.2 via INTRAVENOUS
  Filled 2016-10-17: qty 11

## 2016-10-23 ENCOUNTER — Ambulatory Visit: Payer: Medicare Other | Admitting: Student

## 2016-10-29 ENCOUNTER — Telehealth: Payer: Self-pay | Admitting: Cardiology

## 2016-10-29 NOTE — Telephone Encounter (Signed)
The plan was to discuss long-term [place] on follow-up.   Eliquis & Xarelto will both be more expensive than warfarin which is the other option.  He should be OK not on Eliquis until he returns for f/u - we can discuss then.  Luke [name]

## 2016-10-29 NOTE — Telephone Encounter (Signed)
SPOKE TO PATIENT.  AWARE OF INFORMATION.  MOVED APPOINTMENT TO 11/06/16 TO DISCUSS.  NO SAMPLES AVAILABLE .  PATIENT STATES IT WILL COST $200'S A MONTH PER PHARMACY.  PATIENT WILL CALL INSURANCE AN CHECK ON FORMULARY CHOICE.   AWARE CHECK TO SEE IF SAMPLE MAY COME AVAILABLE WILL CONTACT PATIENT.  PER DR Ellyn Hack if sample availble may take if not can wait until appointment.

## 2016-10-29 NOTE — Telephone Encounter (Signed)
New message    Pt c/o medication issue:  1. Name of Medication: Eliquis   2. How are you currently taking this medication (dosage and times per day)? 5 mg-twice daily.  3. Are you having a reaction (difficulty breathing--STAT)? No.  4. What is your medication issue? Pt is calling to talk about if he needs to continue this medication since he is low risk. He said it's also very expensive.

## 2016-10-29 NOTE — Telephone Encounter (Signed)
Returned the call to the patient. He stated that he has run out of Eliquis. He was told that he was low risk so would like to know if he needed to stay on Eliquis. It was explained that he has a history of atrial fibrillation so he would need to be on some form of this medication. He would like Dr. Allison Quarry opinion on whether he needs to be on an anticoagulant and if so he would like another cheaper option. Samples have been offered to him but he stated he would wait to hear back.

## 2016-11-01 ENCOUNTER — Ambulatory Visit (INDEPENDENT_AMBULATORY_CARE_PROVIDER_SITE_OTHER): Payer: Medicare Other | Admitting: Family Medicine

## 2016-11-01 ENCOUNTER — Encounter: Payer: Self-pay | Admitting: Family Medicine

## 2016-11-01 VITALS — BP 150/90 | HR 61 | Temp 98.2°F | Ht 67.0 in | Wt 189.0 lb

## 2016-11-01 DIAGNOSIS — I4891 Unspecified atrial fibrillation: Secondary | ICD-10-CM | POA: Diagnosis not present

## 2016-11-01 DIAGNOSIS — I10 Essential (primary) hypertension: Secondary | ICD-10-CM | POA: Diagnosis not present

## 2016-11-01 NOTE — Progress Notes (Signed)
   Subjective:    Patient ID: Luke [name], male    DOB: October 18, 1945, 71 y.o.   MRN: [id number]  HPI 71 yr old male to establish with [place] after transferring from Dr. Yong Channel. He feels fine but has some medication questions. He was found to be in atrial fibrillation with a RVR on 09-25-16. He was briefly hospitalized and he spontaneously converted to NSR on a Dilltiazem drip. He was started on Eliquis and he was referred to see Dr. Glenetta Hew in the Cardiology office. An ECHO showed no wall motion abnormalities and his EF was 55-60%. He then underwent a nuclear stress test on 10-17-16 which was normal and revealed no reversible ischemia. His ventricular rate has been well controlled and he has remained in NSR. His BP has been a bit high at home though with a lot of systolic readings in the 409B. He ran out of Eliquis samples 5 days ago and he contacted Dr. Allison Quarry office. Dr. Ellyn Hack replied that it would be okay for [facility] to stay off Eliquis until his follow up visit with Dr. Ellyn Hack on 11-06-16.   Review of Systems  Constitutional: Negative.   Respiratory: Negative.   Cardiovascular: Negative.   Gastrointestinal: Negative.   Neurological: Negative.        Objective:   Physical Exam  Constitutional: He is oriented to person, place, and time. He appears well-developed and well-nourished.  Neck: No thyromegaly present.  Cardiovascular: Normal rate, regular rhythm, normal heart sounds and intact distal pulses.   No murmur heard. Pulmonary/Chest: Effort normal and breath sounds normal. No respiratory distress. He has no wheezes. He has no rales.  Musculoskeletal: He exhibits no edema.  Lymphadenopathy:    He has no cervical adenopathy.  Neurological: He is alert and oriented to person, place, and time.          Assessment & Plan:  This patient has had a recent bout of atrial fibrillation, but he is now in NSR with a controlled ventricular rate. He is off all anticoagulants for now. I  did not suggest he take aspirin because he has a hx of GI bleeds. He will see Dr. Ellyn Hack next week and they will discuss whether he will need further anticoagulation and if so, for how long. In the meantime his BP is a bit high so we will increase the Metoprolol to 2 pills a day (total of 50 mg).  [name], MD

## 2016-11-01 NOTE — Patient Instructions (Signed)
WE NOW OFFER   [facility] FAST TRACK!!!  SAME DAY Appointments for ACUTE CARE  Such as: Sprains, Injuries, cuts, abrasions, rashes, muscle pain, joint pain, back pain Colds, flu, sore throats, headache, allergies, cough, fever  Ear pain, sinus and eye infections Abdominal pain, nausea, vomiting, diarrhea, upset stomach Animal/insect bites  3 Easy Ways to Schedule: Walk-In Scheduling Call in scheduling Mychart Sign-up: [email or web address]         

## 2016-11-06 ENCOUNTER — Encounter: Payer: Self-pay | Admitting: Cardiology

## 2016-11-06 ENCOUNTER — Ambulatory Visit (INDEPENDENT_AMBULATORY_CARE_PROVIDER_SITE_OTHER): Payer: Medicare Other | Admitting: Cardiology

## 2016-11-06 VITALS — BP 138/90 | HR 70 | Ht 67.0 in | Wt 191.6 lb

## 2016-11-06 DIAGNOSIS — E784 Other hyperlipidemia: Secondary | ICD-10-CM | POA: Diagnosis not present

## 2016-11-06 DIAGNOSIS — I472 Ventricular tachycardia: Secondary | ICD-10-CM

## 2016-11-06 DIAGNOSIS — I480 Paroxysmal atrial fibrillation: Secondary | ICD-10-CM | POA: Diagnosis not present

## 2016-11-06 DIAGNOSIS — E7849 Other hyperlipidemia: Secondary | ICD-10-CM

## 2016-11-06 DIAGNOSIS — I4729 Other ventricular tachycardia: Secondary | ICD-10-CM

## 2016-11-06 MED ORDER — ASPIRIN EC 81 MG PO TBEC: 81.0000 mg | DELAYED_RELEASE_TABLET | Freq: Every day | ORAL | 3 refills | Status: DC

## 2016-11-06 NOTE — Patient Instructions (Addendum)
MEDICATION ADDITION  CONTINUE ASPIRIN 32 MG ENTERIC COATED DAILY   Your physician wants you to follow-up in [place] DR [place].You will receive a reminder letter in the mail two months in advance. If you don't receive a letter, please call our office to schedule the follow-up appointment.   If you need a refill on your cardiac medications before your next appointment, please call your pharmacy.

## 2016-11-06 NOTE — Progress Notes (Signed)
PCP: Luke Morale, MD  Clinic Note: Chief Complaint  Patient presents with  . Follow-up    stress test, pt denied chest pain and SOB, pt STOP taking Eliquis  . Atrial Fibrillation    HPI: Luke [name] is a 71 y.o. male who is being seen today for the follow-upevaluation of Atrial fibrillation at the request of Luke Morale, MD.  He has PMH notable for HTN &HLD who was recently diagnosed with PAF in the setting of an acute URI -July 24 for URI . Prior to this recent hospitalization, he had never been diagnosed with Afib -- he states that "he may have had some irratic heart beats, at one time or another  He was initially seen in consultation on 10/05/2016 - he indicated that he was relatively asymptomatic at that point resolved his cold.EKG indicates his back in sinus rhythm. Started on [place] with a pulse of potentially requiring cardioversion. He was evaluated with Myoview to evaluate for ischemic etiology for A. Fib and also her brief episode lasting V. Tach while in hospital.  Studies Personally Reviewed - (if available, images/films reviewed: From Epic Chart or Care Everywhere)  Myoview 10/17/2016: EF 61%. No EKG changes. Medium sized mild severity fixed apical and apical inferior defect with normal wall motion suggesting diaphragmatic attenuation. LOW RISK  2 D Echo September 26, 6376:  Normal systolic function. EF 55-60%. No RWMA. Gr 1 DD. Mild AoV calcification - no stenosis. Atrial Septum - lipomatous hypertrophy. --Essentially normal.  Interval History: [name] returns today asymptomatic.  He is concerned re: the cost of taking a DOAC & questions whether or not he needs it.  He has not noted any rapid or irregular heartbeats.  He just occasionally notes having brief spells of feeling anxious with a "weak feeling" in his head & chest -- like he is going to have a panic attack that dose not come.  He only notes occasional < 10 second heart skipping spells, but nothing prolonged.    No further URI symptoms.    The remainder of Cardiovascular ROS: positive for - rare palpitations negative for - chest pain, dyspnea on exertion, edema, loss of consciousness, murmur, orthopnea, paroxysmal nocturnal dyspnea, rapid heart rate, shortness of breath or near syncope, TIA / amaurosis fugax: No melena, hematochezia, hematuria, epistaxis or easy bruising. No claudication.   ROS: A comprehensive was performed. Review of Systems  Constitutional: Negative for chills, fever and malaise/fatigue.       Regaining strength from recent URI  HENT: Negative for congestion.   Respiratory: Negative for cough, shortness of breath and wheezing.   Cardiovascular: Positive for palpitations.       Per HPI  Gastrointestinal: Negative for abdominal pain, constipation, diarrhea, heartburn and nausea.  Genitourinary: Negative for dysuria and urgency.  Musculoskeletal: Positive for joint pain (OA pain - knees / hips).  Skin: Negative.   Neurological: Negative for dizziness, focal weakness and loss of consciousness.  Endo/Heme/Allergies: Does not bruise/bleed easily.  Psychiatric/Behavioral: The patient is not nervous/anxious and does not have insomnia.   All other systems reviewed and are negative.   I have reviewed and (if needed) personally updated the patient's problem list, medications, allergies, past medical and surgical history, social and family history.   Past Medical History:  Diagnosis Date  . Asthma   . Depression   . ED (erectile dysfunction)   . Hyperlipidemia   . Hypertension   . Paroxysmal atrial fibrillation ([place]) 09/2016   Initial diagnosis  was in setting of acute URI/asthma attack  . Solitary pulmonary nodule 04/16/2013   03/30/13  CXR Question left nipple shadow.  10 mm ovoid nodular density right upper lobe, cannot exclude  pulmonary mass/ nodule > f/u cxr 04/29/2013 > no nodule    . Ulcer     Past Surgical History:  Procedure Laterality Date  . NM MYOVIEW LTD   10/2016   ailable, images/films reviewed: From Epic Chart or Care Everywhere)  . TRANSTHORACIC ECHOCARDIOGRAM  93/2355   Normal systolic function. EF 5560%. No RWMA. Gr 1 DD. Mild AoV calcification - no stenosis. Atrial Septum - lipomatous hypertrophy. --Essentially normal.    Current Meds  Medication Sig  . acyclovir (ZOVIRAX) 200 MG capsule TAKE 1 CAPSULE DAILY  . albuterol (PROVENTIL HFA;VENTOLIN HFA) 108 (90 Base) MCG/ACT inhaler Inhale 2 puffs into the lungs every 6 (six) hours as needed for wheezing or shortness of breath.  Marland Kitchen amLODipine (NORVASC) 10 MG tablet TAKE 1 TABLET DAILY  . atorvastatin (LIPITOR) 10 MG tablet Take 1 tablet (10 mg total) by mouth at bedtime.  . fluticasone (FLONASE) 50 MCG/ACT nasal spray Place 2 sprays into both nostrils daily as needed for allergies or rhinitis.  Marland Kitchen losartan (COZAAR) 100 MG tablet Take 1 tablet (100 mg total) by mouth daily.  . metoprolol succinate (TOPROL XL) 25 MG 24 hr tablet Take 1 tablet (25 mg total) by mouth daily.  Marland Kitchen omeprazole (PRILOSEC) 20 MG capsule TAKE 1 CAPSULE DAILY    Allergies  Allergen Reactions  . Aspirin     REACTION: ULCER @LARGE  DOSES  . Hydrochlorothiazide     Leg cramps  . Irbesartan Other (See Comments)    Headaches   . Symbicort [Budesonide-Formoterol Fumarate] Other (See Comments)    legcramps    Social History   Social History  . Marital status: Married    Spouse name: N/A  . Number of children: N/A  . Years of education: N/A   Occupational History  . Retired Pharmacist, hospital Retired   Social History Main Topics  . Smoking status: Never Smoker  . Smokeless tobacco: Never Used  . Alcohol use 1.0 oz/week    2 Standard drinks or equivalent per week     Comment: socially occasionally   . Drug use: No  . Sexual activity: Not Asked   Other Topics Concern  . None   Social History Narrative  . None    family history includes Cancer in his brother; Coronary artery disease in his brother and father;  Emphysema in his brother; Hyperlipidemia in his father; Hypertension in his brother and mother; Lung cancer in his brother; Peripheral vascular disease in his father and mother; Stroke in his mother.  Wt Readings from Last 3 Encounters:  11/06/16 191 lb 9.6 oz (86.9 kg)  11/01/16 189 lb (85.7 kg)  10/17/16 187 lb (84.8 kg)    PHYSICAL EXAM BP 138/90   Pulse 70   Ht 5\' 7"  (1.702 m)   Wt 191 lb 9.6 oz (86.9 kg)   BMI 30.01 kg/m  Physical Exam  Constitutional: He is oriented to person, place, and time. He appears well-developed and well-nourished. No distress.  Borderline obese (truncal)  HENT:  Head: Normocephalic and atraumatic.  Mouth/Throat: No oropharyngeal exudate.  Eyes: EOM are normal. No scleral icterus.  Neck: No hepatojugular reflux and no JVD present. Carotid bruit is not present.  Cardiovascular: Normal rate, regular rhythm, normal heart sounds and intact distal pulses.  Exam reveals no gallop and  no friction rub.   No murmur heard. Pulmonary/Chest: Effort normal and breath sounds normal. No respiratory distress. He has no wheezes. He has no rales.  Abdominal: Soft. Bowel sounds are normal. He exhibits no distension. There is no tenderness. There is no rebound.  Musculoskeletal: Normal range of motion. He exhibits no edema or deformity.  Neurological: He is alert and oriented to person, place, and time. No cranial nerve deficit. Coordination normal.  Skin: Skin is warm and dry. No rash noted. No erythema.  Psychiatric: He has a normal mood and affect. His behavior is normal. Thought content normal.  Nursing note and vitals reviewed.   Adult ECG Report  Rate: 61 ;  Rhythm: normal sinus rhythm and Normal axis, intervals and durations. RBBB.;   Narrative Interpretation: Sinus rhythm has replaced A. fib  Other studies Reviewed: Additional studies/ records that were reviewed today include:  Recent Labs:   Lab Results  Component Value Date   CREATININE 1.03 10/01/2016    BUN 21 10/01/2016   NA 140 10/01/2016   K 4.3 10/01/2016   CL 108 10/01/2016   CO2 23 10/01/2016   Lab Results  Component Value Date   CHOL 109 09/26/2016   HDL 25 (L) 09/26/2016   LDLCALC 56 09/26/2016   LDLDIRECT 73.0 02/08/2015   TRIG 139 09/26/2016   CHOLHDL 4.4 09/26/2016    This patients CHA2DS2-VASc Score and unadjusted Ischemic Stroke Rate (% per year) is equal to 2.2 % stroke rate/year from a score of 2 Above score calculated as 1 point each if present [CHF, HTN, DM, Vascular=MI/PAD/Aortic Plaque, Age if 65-74, or Male]; 2 points each if present [Age > 75, or Stroke/TIA/TE]   ASSESSMENT / PLAN: Problem List Items Addressed This Visit    Hyperlipidemia (Chronic)    Now on Atorvastatin with Amlodipine. Labs followed by PCP.      Relevant Medications   aspirin EC 81 MG tablet   NSVT (nonsustained ventricular tachycardia) (HCC)    No recurrence. Was in setting of acute illness. Non-ischemic Myoview & normal Echo.  Continue Beta Blocker.      Relevant Medications   aspirin EC 81 MG tablet   PAF (paroxysmal atrial fibrillation) (HCC) - Primary (Chronic)    No recurrent episodes. For now, will plan to use Beta Blocker for breakthrough rate control.  Only consider rhythm control for frequent symptomatic recurrences.  Can consider Pill-in-the-Pocket (Flecainide).  Long discussion - (20 min +) about CHA2DS2Vasc & HAS-BLED scores with CVA risk.  He indicates that a DOAC will cost him ~$500 per 90 day supply (he does not have Medicare Part D plan that covers [place]).  For now, we will continue pursue options for him to afford DOAC (he may need to update is Part D). Until then, will recommend ASA 81 mg.      Relevant Medications   aspirin EC 81 MG tablet      Current medicines are reviewed at length with the patient today. (+/- concerns) stopped Eliquis   Patient Instructions  MEDICATION ADDITION  CONTINUE ASPIRIN 81 MG ENTERIC COATED DAILY   Your physician  wants you to follow-up in [place] DR Nadeen [place].You will receive a reminder letter in the mail two months in advance. If you don't receive a letter, please call our office to schedule the follow-up appointment.   If you need a refill on your cardiac medications before your next appointment, please call your pharmacy.   Studies Ordered:   No orders of  the defined types were placed in this encounter.     Glenetta Hew, M.D., M.S. Interventional Cardiologist   Pager # [phone] Phone # [phone] [address] [place], [place] [address]

## 2016-11-07 ENCOUNTER — Encounter: Payer: Self-pay | Admitting: Cardiology

## 2016-11-07 NOTE — Assessment & Plan Note (Addendum)
No recurrence. Was in setting of acute illness. Non-ischemic Myoview & normal Echo.  Continue Beta Blocker.

## 2016-11-07 NOTE — Assessment & Plan Note (Addendum)
No recurrent episodes. For now, will plan to use Beta Blocker for breakthrough rate control.  Only consider rhythm control for frequent symptomatic recurrences.  Can consider Pill-in-the-Pocket (Flecainide).  Long discussion - (20 min +) about CHA2DS2Vasc & HAS-BLED scores with CVA risk.  He indicates that a DOAC will cost him ~$500 per 90 day supply (he does not have Medicare Part D plan that covers [address]).  For now, we will continue pursue options for him to afford DOAC (he may need to update is Part D). Until then, will recommend ASA 81 mg.

## 2016-11-07 NOTE — Assessment & Plan Note (Signed)
Now on Atorvastatin with Amlodipine. Labs followed by PCP.

## 2016-11-15 ENCOUNTER — Ambulatory Visit: Payer: Medicare Other | Admitting: Cardiology

## 2016-11-22 ENCOUNTER — Encounter: Payer: Self-pay | Admitting: Family Medicine

## 2016-11-26 ENCOUNTER — Ambulatory Visit (INDEPENDENT_AMBULATORY_CARE_PROVIDER_SITE_OTHER): Payer: Medicare Other

## 2016-11-26 DIAGNOSIS — Z23 Encounter for immunization: Secondary | ICD-10-CM | POA: Diagnosis not present

## 2016-11-27 ENCOUNTER — Ambulatory Visit: Payer: Medicare Other | Admitting: Cardiology

## 2016-12-10 ENCOUNTER — Encounter: Payer: Self-pay | Admitting: Family Medicine

## 2016-12-10 NOTE — Telephone Encounter (Signed)
Tell him to stop the Metoprolol. Instead try Carvedilol 3.125 mg bid. Call in #[phone] with 2 rf

## 2016-12-17 ENCOUNTER — Encounter: Payer: Self-pay | Admitting: Family Medicine

## 2016-12-17 ENCOUNTER — Ambulatory Visit (INDEPENDENT_AMBULATORY_CARE_PROVIDER_SITE_OTHER): Payer: Medicare Other | Admitting: Family Medicine

## 2016-12-17 VITALS — BP 150/90 | HR 58 | Temp 98.6°F | Ht 67.0 in | Wt 196.0 lb

## 2016-12-17 DIAGNOSIS — I480 Paroxysmal atrial fibrillation: Secondary | ICD-10-CM

## 2016-12-17 DIAGNOSIS — I10 Essential (primary) hypertension: Secondary | ICD-10-CM | POA: Diagnosis not present

## 2016-12-17 DIAGNOSIS — E782 Mixed hyperlipidemia: Secondary | ICD-10-CM

## 2016-12-17 MED ORDER — CARVEDILOL 6.25 MG PO TABS: 6.2500 mg | ORAL_TABLET | Freq: Two times a day (BID) | ORAL | 5 refills | Status: DC

## 2016-12-17 NOTE — Patient Instructions (Signed)
WE NOW OFFER   [facility] FAST TRACK!!!  SAME DAY Appointments for ACUTE CARE  Such as: Sprains, Injuries, cuts, abrasions, rashes, muscle pain, joint pain, back pain Colds, flu, sore throats, headache, allergies, cough, fever  Ear pain, sinus and eye infections Abdominal pain, nausea, vomiting, diarrhea, upset stomach Animal/insect bites  3 Easy Ways to Schedule: Walk-In Scheduling Call in scheduling Mychart Sign-up: [email or web address]         

## 2016-12-17 NOTE — Progress Notes (Signed)
   Subjective:    Patient ID: Luke [name], male    DOB: December 23, 1945, 71 y.o.   MRN: [id number]  HPI Here to follow up. He feels good except he started having some facial flushing a week ago. He feels a warm sensation in the face and his skin turns a light red. This comes and goes. His BP remains stable. His 2 newest medications are Atorvastatin and Metoprolol so he thinks one of these is responsible for the flushing.    Review of Systems  Constitutional: Negative.   Respiratory: Negative.   Cardiovascular: Negative.   Neurological: Negative.        Objective:   Physical Exam  Constitutional: He is oriented to person, place, and time. He appears well-developed and well-nourished.  Cardiovascular: Normal rate, regular rhythm, normal heart sounds and intact distal pulses.   Pulmonary/Chest: Effort normal and breath sounds normal. No respiratory distress. He has no wheezes. He has no rales.  Musculoskeletal: He exhibits no edema.  Neurological: He is alert and oriented to person, place, and time.          Assessment & Plan:  His cardiac status is stable with good rate control and BP control. No signs of atrial fibrillation. He has been flushing and we think the Metoprolol may be the reason. We will stop this and switch to Carvedilol 6.25 mg bid. Recheck in 3 weeks.  [name], MD

## 2016-12-27 NOTE — Addendum Note (Signed)
Addended by: Therisa Doyne on: 12/27/2016 11:59 AM   Modules accepted: Orders

## 2017-02-21 ENCOUNTER — Ambulatory Visit (INDEPENDENT_AMBULATORY_CARE_PROVIDER_SITE_OTHER): Payer: Medicare Other | Admitting: Family Medicine

## 2017-02-21 ENCOUNTER — Encounter: Payer: Self-pay | Admitting: Family Medicine

## 2017-02-21 VITALS — BP 140/80 | HR 60 | Temp 97.9°F | Ht 65.25 in | Wt 190.6 lb

## 2017-02-21 DIAGNOSIS — S39012A Strain of muscle, fascia and tendon of lower back, initial encounter: Secondary | ICD-10-CM | POA: Diagnosis not present

## 2017-02-21 DIAGNOSIS — I480 Paroxysmal atrial fibrillation: Secondary | ICD-10-CM | POA: Diagnosis not present

## 2017-02-21 DIAGNOSIS — N138 Other obstructive and reflux uropathy: Secondary | ICD-10-CM | POA: Diagnosis not present

## 2017-02-21 DIAGNOSIS — E782 Mixed hyperlipidemia: Secondary | ICD-10-CM | POA: Diagnosis not present

## 2017-02-21 DIAGNOSIS — K219 Gastro-esophageal reflux disease without esophagitis: Secondary | ICD-10-CM

## 2017-02-21 DIAGNOSIS — G2581 Restless legs syndrome: Secondary | ICD-10-CM | POA: Diagnosis not present

## 2017-02-21 DIAGNOSIS — B009 Herpesviral infection, unspecified: Secondary | ICD-10-CM

## 2017-02-21 DIAGNOSIS — N401 Enlarged prostate with lower urinary tract symptoms: Secondary | ICD-10-CM | POA: Diagnosis not present

## 2017-02-21 DIAGNOSIS — R739 Hyperglycemia, unspecified: Secondary | ICD-10-CM

## 2017-02-21 DIAGNOSIS — I10 Essential (primary) hypertension: Secondary | ICD-10-CM

## 2017-02-21 LAB — CBC WITH DIFFERENTIAL/PLATELET
BASOS PCT: 0.9 % (ref 0.0–3.0)
Basophils Absolute: 0.1 10*3/uL (ref 0.0–0.1)
EOS PCT: 6.6 % — AB (ref 0.0–5.0)
Eosinophils Absolute: 0.7 10*3/uL (ref 0.0–0.7)
HCT: 44.3 % (ref 39.0–52.0)
Hemoglobin: 14.5 g/dL (ref 13.0–17.0)
LYMPHS ABS: 1.9 10*3/uL (ref 0.7–4.0)
Lymphocytes Relative: 18.5 % (ref 12.0–46.0)
MCHC: 32.8 g/dL (ref 30.0–36.0)
MCV: 85.8 fl (ref 78.0–100.0)
MONOS PCT: 5.5 % (ref 3.0–12.0)
Monocytes Absolute: 0.6 10*3/uL (ref 0.1–1.0)
NEUTROS ABS: 7.2 10*3/uL (ref 1.4–7.7)
NEUTROS PCT: 68.5 % (ref 43.0–77.0)
PLATELETS: 273 10*3/uL (ref 150.0–400.0)
RBC: 5.17 Mil/uL (ref 4.22–5.81)
RDW: 13.9 % (ref 11.5–15.5)
WBC: 10.4 10*3/uL (ref 4.0–10.5)

## 2017-02-21 LAB — HEPATIC FUNCTION PANEL
ALT: 13 U/L (ref 0–53)
AST: 12 U/L (ref 0–37)
Albumin: 4.2 g/dL (ref 3.5–5.2)
Alkaline Phosphatase: 91 U/L (ref 39–117)
BILIRUBIN DIRECT: 0.1 mg/dL (ref 0.0–0.3)
BILIRUBIN TOTAL: 0.6 mg/dL (ref 0.2–1.2)
TOTAL PROTEIN: 7 g/dL (ref 6.0–8.3)

## 2017-02-21 LAB — BASIC METABOLIC PANEL
BUN: 20 mg/dL (ref 6–23)
CHLORIDE: 105 meq/L (ref 96–112)
CO2: 28 meq/L (ref 19–32)
CREATININE: 1.07 mg/dL (ref 0.40–1.50)
Calcium: 9.3 mg/dL (ref 8.4–10.5)
GFR: 72.42 mL/min (ref >60.00)
GLUCOSE: 101 mg/dL — AB (ref 70–99)
Potassium: 4.2 mEq/L (ref 3.5–5.1)
Sodium: 142 mEq/L (ref 135–145)

## 2017-02-21 LAB — POC URINALSYSI DIPSTICK (AUTOMATED)
BILIRUBIN UA: NEGATIVE
Blood, UA: NEGATIVE
Glucose, UA: NEGATIVE
KETONES UA: NEGATIVE
LEUKOCYTES UA: NEGATIVE
Nitrite, UA: NEGATIVE
Protein, UA: NEGATIVE
SPEC GRAV UA: 1.025 (ref 1.010–1.025)
Urobilinogen, UA: 0.2 E.U./dL
pH, UA: 5.5 (ref 5.0–8.0)

## 2017-02-21 LAB — HEMOGLOBIN A1C: Hgb A1c MFr Bld: 6.3 % (ref 4.6–6.5)

## 2017-02-21 LAB — LIPID PANEL
CHOLESTEROL: 133 mg/dL (ref 0–200)
HDL: 30.6 mg/dL — AB (ref >39.00)
LDL Cholesterol: 64 mg/dL (ref 0–99)
NONHDL: 102.89
Total CHOL/HDL Ratio: 4
Triglycerides: 194 mg/dL — ABNORMAL HIGH (ref 0.0–149.0)
VLDL: 38.8 mg/dL (ref 0.0–40.0)

## 2017-02-21 LAB — TSH: TSH: 2.39 u[IU]/mL (ref 0.35–4.50)

## 2017-02-21 LAB — PSA: PSA: 2.25 ng/mL (ref 0.10–4.00)

## 2017-02-21 MED ORDER — LOSARTAN POTASSIUM 100 MG PO TABS: 100.0000 mg | ORAL_TABLET | Freq: Every day | ORAL | 3 refills | Status: DC

## 2017-02-21 MED ORDER — ACYCLOVIR 200 MG PO CAPS: ORAL_CAPSULE | ORAL | 3 refills | Status: DC

## 2017-02-21 MED ORDER — AMLODIPINE BESYLATE 10 MG PO TABS: 10.0000 mg | ORAL_TABLET | Freq: Every day | ORAL | 3 refills | Status: DC

## 2017-02-21 MED ORDER — OMEPRAZOLE 20 MG PO CPDR: 20.0000 mg | DELAYED_RELEASE_CAPSULE | Freq: Every day | ORAL | 3 refills | Status: DC

## 2017-02-21 MED ORDER — METAXALONE 800 MG PO TABS: 800.0000 mg | ORAL_TABLET | Freq: Four times a day (QID) | ORAL | 2 refills | Status: DC

## 2017-02-21 MED ORDER — ATORVASTATIN CALCIUM 10 MG PO TABS: 10.0000 mg | ORAL_TABLET | Freq: Every day | ORAL | 3 refills | Status: DC

## 2017-02-21 MED ORDER — CARVEDILOL 6.25 MG PO TABS: 6.2500 mg | ORAL_TABLET | Freq: Two times a day (BID) | ORAL | 3 refills | Status: DC

## 2017-02-21 NOTE — Progress Notes (Signed)
   Subjective:    Patient ID: Luke [name], male    DOB: Jul 16, 1945, 71 y.o.   MRN: [id number]  HPI Here to follow up on issues and to check his back. Last week while shoveling snow he felt a twinge in the right lower back and he has had spasms and pain ever since. No radiation to the legs. Using heat and Advil. He tried a Flexeril but this is too sedating for him. Otherwise he has felt well. His BP is stable.    Review of Systems  Constitutional: Negative.   HENT: Negative.   Eyes: Negative.   Respiratory: Negative.   Cardiovascular: Negative.   Gastrointestinal: Negative.   Genitourinary: Negative.   Musculoskeletal: Positive for back pain.  Skin: Negative.   Neurological: Negative.   Psychiatric/Behavioral: Negative.        Objective:   Physical Exam  Constitutional: He is oriented to person, place, and time. He appears well-developed and well-nourished. No distress.  HENT:  Head: Normocephalic and atraumatic.  Right Ear: External ear normal.  Left Ear: External ear normal.  Nose: Nose normal.  Mouth/Throat: Oropharynx is clear and moist. No oropharyngeal exudate.  Eyes: Conjunctivae and EOM are normal. Pupils are equal, round, and reactive to light. Right eye exhibits no discharge. Left eye exhibits no discharge. No scleral icterus.  Neck: Neck supple. No JVD present. No tracheal deviation present. No thyromegaly present.  Cardiovascular: Normal rate, regular rhythm, normal heart sounds and intact distal pulses. Exam reveals no gallop and no friction rub.  No murmur heard. Pulmonary/Chest: Effort normal and breath sounds normal. No respiratory distress. He has no wheezes. He has no rales. He exhibits no tenderness.  Abdominal: Soft. Bowel sounds are normal. He exhibits no distension and no mass. There is no tenderness. There is no rebound and no guarding.  Genitourinary: Rectum normal, prostate normal and penis normal. Rectal exam shows guaiac negative stool. No penile  tenderness.  Musculoskeletal: Normal range of motion. He exhibits no edema.  He is tender in the right lower back, full ROM. Negative SLR  Lymphadenopathy:    He has no cervical adenopathy.  Neurological: He is alert and oriented to person, place, and time. He has normal reflexes. No cranial nerve deficit. He exhibits normal muscle tone. Coordination normal.  Skin: Skin is warm and dry. No rash noted. He is not diaphoretic. No erythema. No pallor.  Psychiatric: He has a normal mood and affect. His behavior is normal. Judgment and thought content normal.          Assessment & Plan:  His HTN is stable. Currently he is in sinus rhythm. He has strained the lower back and he will try Skelaxin for the spasms. Get fasting labs today to check his lipids, his A1c, etc.  [name], MD

## 2017-03-01 ENCOUNTER — Telehealth: Payer: Self-pay | Admitting: Family Medicine

## 2017-03-01 DIAGNOSIS — M62830 Muscle spasm of back: Secondary | ICD-10-CM | POA: Diagnosis not present

## 2017-03-01 DIAGNOSIS — M546 Pain in thoracic spine: Secondary | ICD-10-CM | POA: Diagnosis not present

## 2017-03-01 DIAGNOSIS — M9902 Segmental and somatic dysfunction of thoracic region: Secondary | ICD-10-CM | POA: Diagnosis not present

## 2017-03-01 DIAGNOSIS — M9901 Segmental and somatic dysfunction of cervical region: Secondary | ICD-10-CM | POA: Diagnosis not present

## 2017-03-01 NOTE — Telephone Encounter (Signed)
Copied from [place]. Topic: General - Other >> Mar 01, 2017  3:36 PM Lolita Rieger, [place] wrote: Reason for CRM: pt would like return call in reference to a referral to cardiology  [phone]

## 2017-03-01 NOTE — Telephone Encounter (Signed)
Looks like the referral was placed on 02/21/2017 referral was sent to [facility]. Pt stated that he hasn't heard anything back but his old cardiologist doctor Dr. Ellyn Hack did reach out to him trying to get set up for an appt. However, pt no longer wants to see Dr. Ellyn Hack but he does want to see Dr. Warren Lacy. Pt would like for [place] to look into this. Sent to [employer].

## 2017-03-04 DIAGNOSIS — M9902 Segmental and somatic dysfunction of thoracic region: Secondary | ICD-10-CM | POA: Diagnosis not present

## 2017-03-04 DIAGNOSIS — M546 Pain in thoracic spine: Secondary | ICD-10-CM | POA: Diagnosis not present

## 2017-03-04 DIAGNOSIS — M62830 Muscle spasm of back: Secondary | ICD-10-CM | POA: Diagnosis not present

## 2017-03-04 DIAGNOSIS — M9901 Segmental and somatic dysfunction of cervical region: Secondary | ICD-10-CM | POA: Diagnosis not present

## 2017-03-06 DIAGNOSIS — M9901 Segmental and somatic dysfunction of cervical region: Secondary | ICD-10-CM | POA: Diagnosis not present

## 2017-03-06 DIAGNOSIS — M62830 Muscle spasm of back: Secondary | ICD-10-CM | POA: Diagnosis not present

## 2017-03-06 DIAGNOSIS — M546 Pain in thoracic spine: Secondary | ICD-10-CM | POA: Diagnosis not present

## 2017-03-06 DIAGNOSIS — M9902 Segmental and somatic dysfunction of thoracic region: Secondary | ICD-10-CM | POA: Diagnosis not present

## 2017-03-08 DIAGNOSIS — M9901 Segmental and somatic dysfunction of cervical region: Secondary | ICD-10-CM | POA: Diagnosis not present

## 2017-03-08 DIAGNOSIS — M9902 Segmental and somatic dysfunction of thoracic region: Secondary | ICD-10-CM | POA: Diagnosis not present

## 2017-03-08 DIAGNOSIS — M62830 Muscle spasm of back: Secondary | ICD-10-CM | POA: Diagnosis not present

## 2017-03-08 DIAGNOSIS — M546 Pain in thoracic spine: Secondary | ICD-10-CM | POA: Diagnosis not present

## 2017-03-11 DIAGNOSIS — M9901 Segmental and somatic dysfunction of cervical region: Secondary | ICD-10-CM | POA: Diagnosis not present

## 2017-03-11 DIAGNOSIS — M546 Pain in thoracic spine: Secondary | ICD-10-CM | POA: Diagnosis not present

## 2017-03-11 DIAGNOSIS — M9902 Segmental and somatic dysfunction of thoracic region: Secondary | ICD-10-CM | POA: Diagnosis not present

## 2017-03-11 DIAGNOSIS — M62830 Muscle spasm of back: Secondary | ICD-10-CM | POA: Diagnosis not present

## 2017-03-18 ENCOUNTER — Telehealth: Payer: Self-pay | Admitting: Cardiology

## 2017-03-18 NOTE — Telephone Encounter (Signed)
OK with me.

## 2017-03-18 NOTE — Telephone Encounter (Signed)
New message     Dr Sharlene Motts would like patient to switch from Dr Ellyn Hack to Dr Percival Spanish, because the patient would feel more comfortable with Dr Percival Spanish

## 2017-03-19 NOTE — Telephone Encounter (Signed)
Sure --   DH 

## 2017-04-01 NOTE — Progress Notes (Signed)
Cardiology Office Note   Date:  04/03/2017   ID:  [name], [name] 1945-12-09, MRN [id number]  PCP:  Laurey Morale, MD  Cardiologist:   No primary care provider on file. Referring:  Laurey Morale, MD  Chief Complaint  Patient presents with  . Follow-up  . Headache    A little this past Sunday.      History of Present Illness: Luke [name] is a 72 y.o. male who presents for follow up of atrial fib.  She has previously been seen by another cardiologist and this is my first appt with him.  He was found to have atrial fib in the context of a URI last year.  He had a negative ischemia work up and unremarkable echo  He did not want to continue with the [place].     Since I last saw him he has done well.  The patient denies any new symptoms such as chest discomfort, neck or arm discomfort. There has been no new shortness of breath, PND or orthopnea. There have been no reported palpitations, presyncope or syncope.  Of note he never really felt his atrial fibrillation before.  He has not felt any rapid heart rate since he was seen and he does take his blood pressure occasionally and his heart rate has been normal.   Past Medical History:  Diagnosis Date  . Asthma   . Depression   . ED (erectile dysfunction)   . Hyperlipidemia   . Hypertension   . Paroxysmal atrial fibrillation ([place]) 09/2016   Initial diagnosis was in setting of acute URI/asthma attack  . Solitary pulmonary nodule 04/16/2013   03/30/13  CXR Question left nipple shadow.  10 mm ovoid nodular density right upper lobe, cannot exclude  pulmonary mass/ nodule > f/u cxr 04/29/2013 > no nodule    . Ulcer     Past Surgical History:  Procedure Laterality Date  . NM MYOVIEW LTD  10/2016   ailable, images/films reviewed: From Epic Chart or Care Everywhere)  . TRANSTHORACIC ECHOCARDIOGRAM  85/0277   Normal systolic function. EF 5560%. No RWMA. Gr 1 DD. Mild AoV calcification - no stenosis. Atrial Septum - lipomatous  hypertrophy. --Essentially normal.     Current Outpatient Medications  Medication Sig Dispense Refill  . acyclovir (ZOVIRAX) 200 MG capsule TAKE 1 CAPSULE DAILY 90 capsule 3  . albuterol (PROVENTIL HFA;VENTOLIN HFA) 108 (90 Base) MCG/ACT inhaler Inhale 2 puffs into the lungs every 6 (six) hours as needed for wheezing or shortness of breath.    Marland Kitchen amLODipine (NORVASC) 10 MG tablet Take 1 tablet (10 mg total) by mouth daily. 90 tablet 3  . aspirin EC 81 MG tablet Take 1 tablet (81 mg total) by mouth daily. 90 tablet 3  . atorvastatin (LIPITOR) 10 MG tablet Take 1 tablet (10 mg total) by mouth at bedtime. 90 tablet 3  . carvedilol (COREG) 6.25 MG tablet Take 1 tablet (6.25 mg total) by mouth 2 (two) times daily with a meal. 180 tablet 3  . fluticasone (FLONASE) 50 MCG/ACT nasal spray Place 2 sprays into both nostrils daily as needed for allergies or rhinitis. 32 g 6  . losartan (COZAAR) 100 MG tablet Take 1 tablet (100 mg total) by mouth daily. 90 tablet 3  . metaxalone (SKELAXIN) 800 MG tablet Take 1 tablet (800 mg total) by mouth 4 (four) times daily. (Patient taking differently: Take 800 mg by mouth as needed. ) 60 tablet 2  .  omeprazole (PRILOSEC) 20 MG capsule Take 1 capsule (20 mg total) by mouth daily. 90 capsule 3   No current facility-administered medications for this visit.     Allergies:   Aspirin; Hydrochlorothiazide; Irbesartan; and Symbicort [budesonide-formoterol fumarate]    ROS:  Please see the history of present illness.   Otherwise, review of systems are positive for ED.   All other systems are reviewed and negative.    PHYSICAL EXAM: VS:  BP 124/70   Pulse 61   Ht 5\' 7"  (1.702 m)   Wt 194 lb (88 kg)   BMI 30.38 kg/m  , BMI Body mass index is 30.38 kg/m. GENERAL:  Well appearing HEENT:  Pupils equal round and reactive, fundi not visualized, oral mucosa unremarkable NECK:  No jugular venous distention, waveform within normal limits, carotid upstroke brisk and  symmetric, no bruits, no thyromegaly LYMPHATICS:  No cervical, inguinal adenopathy LUNGS:  Clear to auscultation bilaterally BACK:  No CVA tenderness CHEST:  Unremarkable HEART:  PMI not displaced or sustained,S1 and S2 within normal limits, no S3, no S4, no clicks, no rubs, no murmurs ABD:  Flat, positive bowel sounds normal in frequency in pitch, no bruits, no rebound, no guarding, no midline pulsatile mass, no hepatomegaly, no splenomegaly EXT:  2 plus pulses throughout, no edema, no cyanosis no clubbing SKIN:  No rashes no nodules NEURO:  Cranial nerves II through XII grossly intact, motor grossly intact throughout PSYCH:  Cognitively intact, oriented to person place and time    EKG:  EKG is not ordered today.   Recent Labs: 09/25/2016: B Natriuretic Peptide 70.0 10/01/2016: Magnesium 1.6 02/21/2017: ALT 13; BUN 20; Creatinine, Ser 1.07; Hemoglobin 14.5; Platelets 273.0; Potassium 4.2; Sodium 142; TSH 2.39    Lipid Panel    Component Value Date/Time   CHOL 133 02/21/2017 0954   TRIG 194.0 (H) 02/21/2017 0954   HDL 30.60 (L) 02/21/2017 0954   CHOLHDL 4 02/21/2017 0954   VLDL 38.8 02/21/2017 0954   LDLCALC 64 02/21/2017 0954   LDLDIRECT 73.0 02/08/2015 0906      Wt Readings from Last 3 Encounters:  04/03/17 194 lb (88 kg)  02/21/17 190 lb 9.6 oz (86.5 kg)  12/17/16 196 lb (88.9 kg)      Other studies Reviewed: Additional studies/ records that were reviewed today include: Hospital records. Review of the above records demonstrates:  Please see elsewhere in the note.     ASSESSMENT AND PLAN:    ATRIAL FIB: We had a long discussion about atrial fibrillation.  He had one episode of this related to having a URI and taking an over-the-counter cold medicine.  He did not want to continue a DOAC.  I do not think this is completely unreasonable although he understands if he has a second episode there would clearly be an indication for anticoagulation Mr. [name] has  a CHA2DS2 - VASc score of 2.       Current medicines are reviewed at length with the patient today.  The patient does not have concerns regarding medicines.  The following changes have been made:  no change  Labs/ tests ordered today include: None No orders of the defined types were placed in this encounter.    Disposition:   FU with me in one year or sooner if needed.      Signed, Minus Breeding, MD  04/03/2017 2:36 PM    [facility]

## 2017-04-03 ENCOUNTER — Encounter: Payer: Self-pay | Admitting: Cardiology

## 2017-04-03 ENCOUNTER — Ambulatory Visit (INDEPENDENT_AMBULATORY_CARE_PROVIDER_SITE_OTHER): Payer: Medicare Other | Admitting: Cardiology

## 2017-04-03 VITALS — BP 124/70 | HR 61 | Ht 67.0 in | Wt 194.0 lb

## 2017-04-03 DIAGNOSIS — I480 Paroxysmal atrial fibrillation: Secondary | ICD-10-CM | POA: Diagnosis not present

## 2017-04-03 NOTE — Patient Instructions (Signed)
Medication Instructions:  Continue current medications  If you need a refill on your cardiac medications before your next appointment, please call your pharmacy.  Labwork: None Ordered   Testing/Procedures: None Ordered  Follow-Up: Your physician wants you to follow-up in: 1 Year. You should receive a reminder letter in the mail two months in advance. If you do not receive a letter, please call our office [phone].    Thank you for choosing [facility]!!      

## 2017-09-04 ENCOUNTER — Ambulatory Visit (INDEPENDENT_AMBULATORY_CARE_PROVIDER_SITE_OTHER): Payer: Medicare Other | Admitting: Family Medicine

## 2017-09-04 ENCOUNTER — Encounter: Payer: Self-pay | Admitting: Family Medicine

## 2017-09-04 VITALS — BP 100/68 | HR 61 | Temp 98.1°F | Ht 67.0 in | Wt 188.2 lb

## 2017-09-04 DIAGNOSIS — M545 Low back pain, unspecified: Secondary | ICD-10-CM

## 2017-09-04 DIAGNOSIS — G5602 Carpal tunnel syndrome, left upper limb: Secondary | ICD-10-CM

## 2017-09-04 DIAGNOSIS — L989 Disorder of the skin and subcutaneous tissue, unspecified: Secondary | ICD-10-CM | POA: Diagnosis not present

## 2017-09-04 NOTE — Progress Notes (Signed)
   Subjective:    Patient ID: Luke [name], male    DOB: 1945/07/20, 72 y.o.   MRN: [id number]  HPI Here for 3 issues. First he has had intermittent pain and muscle spasms in the lower back for the past 2 years. He was here in December for a bout of this after shoveling snow. He can usually take one Metaxolone and a Tylenol and go to bed, then the pain is gone by the next morning. No numbness or weakness in the legs. Second 3 days ago he woke up in the morning with numbness and tingling in the left distal forearm and hand. No pain or weakness or swelling. No recent trauma. This then resolved and today it feels fine. Third there is a lesion on the right temple that he wants removed.    Review of Systems  Constitutional: Negative.   Respiratory: Negative.   Cardiovascular: Negative.   Musculoskeletal: Positive for back pain.  Neurological: Positive for numbness. Negative for weakness.       Objective:   Physical Exam  Constitutional: He is oriented to person, place, and time. He appears well-developed and well-nourished.  Cardiovascular: Normal rate, regular rhythm, normal heart sounds and intact distal pulses.  Pulmonary/Chest: Effort normal and breath sounds normal.  Musculoskeletal:  His lower back is normal with no tenderness and full ROM  Neurological: He is alert and oriented to person, place, and time. No cranial nerve deficit or sensory deficit. He exhibits normal muscle tone. Coordination normal.  The left hand and wrist are normal   Skin:  There is a small verrucous lesion on the right temple           Assessment & Plan:  He has intermittent lower back pain. As long as this is controlled with Metaxolone and Tylenol, we agreed to simply watch it for now. He may have some mild carpal tunnel syndrome in the left hand. Observe for now. We will refer him to Dermatology to remove the skin lesion. [name], MD

## 2017-10-22 ENCOUNTER — Telehealth: Payer: Self-pay | Admitting: Family Medicine

## 2017-10-22 NOTE — Telephone Encounter (Signed)
Copied from [place] [id number]. Topic: Quick Communication - See Telephone Encounter >> Oct 22, 2017 11:16 AM Rutherford Nail, NT wrote: CRM for notification. See Telephone encounter for: 10/22/17. Patient calling and states that he would like a call back regarding sildenafil (VIAGRA) 50 MG tablet  that his last provider prescribed. Would like a call back to discuss it. CB#:[phone]

## 2017-10-24 ENCOUNTER — Telehealth: Payer: Self-pay

## 2017-10-24 MED ORDER — SILDENAFIL CITRATE 50 MG PO TABS: 50.0000 mg | ORAL_TABLET | ORAL | 11 refills | Status: DC | PRN

## 2017-10-24 NOTE — Telephone Encounter (Signed)
Call in Viagra 50 mg to use prn, #10 with 11 rf

## 2017-10-24 NOTE — Telephone Encounter (Signed)
Pt called stating that he never heard back in regards to the dermotology referral.   Per note in our Epic system pt stated that he never heard from anyone at the [facility]. However, it looks like pt was schedule but the appt was canceled per [place]. Pt stated that he did NOT receive a phone about this at all.   Sent to [facility] to look into this since Neoma Laming is NOT in the office.

## 2017-10-24 NOTE — Telephone Encounter (Signed)
Pt would like this prescription Viagra refilled for a 30 day supply sent to [facility].   Sent to PCP to advise  Last OV 09/04/2017

## 2017-10-24 NOTE — Telephone Encounter (Signed)
Per [facility] pt called on 10/23/17 and scheduled appt for 01/10/18 @10am . Maybe he does not remember scheduling this appt?  LMTCB

## 2017-10-24 NOTE — Telephone Encounter (Signed)
Rx has been sent in. Patient has been notified. 

## 2017-12-06 ENCOUNTER — Ambulatory Visit (INDEPENDENT_AMBULATORY_CARE_PROVIDER_SITE_OTHER): Payer: Medicare Other

## 2017-12-06 DIAGNOSIS — Z23 Encounter for immunization: Secondary | ICD-10-CM

## 2017-12-27 DIAGNOSIS — R3915 Urgency of urination: Secondary | ICD-10-CM | POA: Diagnosis not present

## 2017-12-27 DIAGNOSIS — N5201 Erectile dysfunction due to arterial insufficiency: Secondary | ICD-10-CM | POA: Diagnosis not present

## 2018-02-14 DIAGNOSIS — D485 Neoplasm of uncertain behavior of skin: Secondary | ICD-10-CM | POA: Diagnosis not present

## 2018-02-14 DIAGNOSIS — L821 Other seborrheic keratosis: Secondary | ICD-10-CM | POA: Diagnosis not present

## 2018-02-14 DIAGNOSIS — L814 Other melanin hyperpigmentation: Secondary | ICD-10-CM | POA: Diagnosis not present

## 2018-02-14 DIAGNOSIS — L918 Other hypertrophic disorders of the skin: Secondary | ICD-10-CM | POA: Diagnosis not present

## 2018-02-20 ENCOUNTER — Encounter: Payer: Self-pay | Admitting: Family Medicine

## 2018-02-24 ENCOUNTER — Ambulatory Visit (INDEPENDENT_AMBULATORY_CARE_PROVIDER_SITE_OTHER): Payer: Medicare Other | Admitting: Family Medicine

## 2018-02-24 ENCOUNTER — Encounter: Payer: Self-pay | Admitting: Family Medicine

## 2018-02-24 VITALS — BP 136/78 | HR 50 | Temp 97.9°F | Ht 66.0 in | Wt 191.1 lb

## 2018-02-24 DIAGNOSIS — N138 Other obstructive and reflux uropathy: Secondary | ICD-10-CM

## 2018-02-24 DIAGNOSIS — G2581 Restless legs syndrome: Secondary | ICD-10-CM | POA: Diagnosis not present

## 2018-02-24 DIAGNOSIS — I10 Essential (primary) hypertension: Secondary | ICD-10-CM | POA: Diagnosis not present

## 2018-02-24 DIAGNOSIS — B009 Herpesviral infection, unspecified: Secondary | ICD-10-CM

## 2018-02-24 DIAGNOSIS — I480 Paroxysmal atrial fibrillation: Secondary | ICD-10-CM | POA: Diagnosis not present

## 2018-02-24 DIAGNOSIS — M546 Pain in thoracic spine: Secondary | ICD-10-CM

## 2018-02-24 DIAGNOSIS — N401 Enlarged prostate with lower urinary tract symptoms: Secondary | ICD-10-CM

## 2018-02-24 DIAGNOSIS — K219 Gastro-esophageal reflux disease without esophagitis: Secondary | ICD-10-CM

## 2018-02-24 DIAGNOSIS — E782 Mixed hyperlipidemia: Secondary | ICD-10-CM | POA: Diagnosis not present

## 2018-02-24 DIAGNOSIS — R739 Hyperglycemia, unspecified: Secondary | ICD-10-CM | POA: Diagnosis not present

## 2018-02-24 LAB — CBC WITH DIFFERENTIAL/PLATELET
Basophils Absolute: 0.1 10*3/uL (ref 0.0–0.1)
Basophils Relative: 0.5 % (ref 0.0–3.0)
Eosinophils Absolute: 0.7 10*3/uL (ref 0.0–0.7)
Eosinophils Relative: 7.1 % — ABNORMAL HIGH (ref 0.0–5.0)
HCT: 43.8 % (ref 39.0–52.0)
Hemoglobin: 14.5 g/dL (ref 13.0–17.0)
Lymphocytes Relative: 23.6 % (ref 12.0–46.0)
Lymphs Abs: 2.5 10*3/uL (ref 0.7–4.0)
MCHC: 33.2 g/dL (ref 30.0–36.0)
MCV: 85 fl (ref 78.0–100.0)
Monocytes Absolute: 0.5 10*3/uL (ref 0.1–1.0)
Monocytes Relative: 5.2 % (ref 3.0–12.0)
Neutro Abs: 6.8 10*3/uL (ref 1.4–7.7)
Neutrophils Relative %: 63.6 % (ref 43.0–77.0)
Platelets: 303 10*3/uL (ref 150.0–400.0)
RBC: 5.15 Mil/uL (ref 4.22–5.81)
RDW: 14 % (ref 11.5–15.5)
WBC: 10.6 10*3/uL — AB (ref 4.0–10.5)

## 2018-02-24 LAB — POC URINALSYSI DIPSTICK (AUTOMATED)
Bilirubin, UA: NEGATIVE
Blood, UA: NEGATIVE
Glucose, UA: NEGATIVE
KETONES UA: NEGATIVE
Leukocytes, UA: NEGATIVE
Nitrite, UA: NEGATIVE
Protein, UA: NEGATIVE
SPEC GRAV UA: 1.02 (ref 1.010–1.025)
UROBILINOGEN UA: 0.2 U/dL
pH, UA: 5.5 (ref 5.0–8.0)

## 2018-02-24 LAB — HEMOGLOBIN A1C: Hgb A1c MFr Bld: 6.3 % (ref 4.6–6.5)

## 2018-02-24 LAB — HEPATIC FUNCTION PANEL
ALT: 14 U/L (ref 0–53)
AST: 13 U/L (ref 0–37)
Albumin: 4.1 g/dL (ref 3.5–5.2)
Alkaline Phosphatase: 97 U/L (ref 39–117)
BILIRUBIN DIRECT: 0.1 mg/dL (ref 0.0–0.3)
TOTAL PROTEIN: 6.7 g/dL (ref 6.0–8.3)
Total Bilirubin: 0.6 mg/dL (ref 0.2–1.2)

## 2018-02-24 LAB — LIPID PANEL
CHOL/HDL RATIO: 4
CHOLESTEROL: 134 mg/dL (ref 0–200)
HDL: 31 mg/dL — ABNORMAL LOW (ref >39.00)
LDL CALC: 69 mg/dL (ref 0–99)
NonHDL: 103.1
TRIGLYCERIDES: 173 mg/dL — AB (ref 0.0–149.0)
VLDL: 34.6 mg/dL (ref 0.0–40.0)

## 2018-02-24 LAB — TSH: TSH: 1.84 u[IU]/mL (ref 0.35–4.50)

## 2018-02-24 LAB — PSA: PSA: 3.13 ng/mL (ref 0.10–4.00)

## 2018-02-24 LAB — BASIC METABOLIC PANEL
BUN: 16 mg/dL (ref 6–23)
CHLORIDE: 106 meq/L (ref 96–112)
CO2: 28 mEq/L (ref 19–32)
Calcium: 9.2 mg/dL (ref 8.4–10.5)
Creatinine, Ser: 1.01 mg/dL (ref 0.40–1.50)
GFR: 77.19 mL/min (ref >60.00)
Glucose, Bld: 107 mg/dL — ABNORMAL HIGH (ref 70–99)
POTASSIUM: 4.8 meq/L (ref 3.5–5.1)
SODIUM: 142 meq/L (ref 135–145)

## 2018-02-24 MED ORDER — TRIAMCINOLONE ACETONIDE 0.1 % EX CREA: 1.0000 "application " | TOPICAL_CREAM | Freq: Two times a day (BID) | CUTANEOUS | 2 refills | Status: DC

## 2018-02-24 MED ORDER — AMLODIPINE BESYLATE 10 MG PO TABS: 10.0000 mg | ORAL_TABLET | Freq: Every day | ORAL | 3 refills | Status: DC

## 2018-02-24 MED ORDER — ATORVASTATIN CALCIUM 10 MG PO TABS: 10.0000 mg | ORAL_TABLET | Freq: Every day | ORAL | 3 refills | Status: DC

## 2018-02-24 MED ORDER — ACYCLOVIR 200 MG PO CAPS: ORAL_CAPSULE | ORAL | 3 refills | Status: DC

## 2018-02-24 MED ORDER — LOSARTAN POTASSIUM 100 MG PO TABS: 100.0000 mg | ORAL_TABLET | Freq: Every day | ORAL | 3 refills | Status: DC

## 2018-02-24 MED ORDER — CARVEDILOL 6.25 MG PO TABS: 6.2500 mg | ORAL_TABLET | Freq: Two times a day (BID) | ORAL | 3 refills | Status: DC

## 2018-02-24 MED ORDER — OMEPRAZOLE 20 MG PO CPDR: 20.0000 mg | DELAYED_RELEASE_CAPSULE | Freq: Every day | ORAL | 3 refills | Status: DC

## 2018-02-24 NOTE — Progress Notes (Signed)
Subjective:    Patient ID: Luke [name], male    DOB: [birth year], 72 y.o.   MRN: [id number]  HPI Here to follow up on issues. He feels well but asks about a pain in the right upper back that started about 2 months ago. No hx of trauma. The pain is sharp and located in the right upper back between the spine and the right scapula. His BP is stable. His GERD is well controlled.    Review of Systems  Constitutional: Negative.   HENT: Negative.   Eyes: Negative.   Respiratory: Negative.   Cardiovascular: Negative.   Gastrointestinal: Negative.   Genitourinary: Negative.   Musculoskeletal: Negative.   Skin: Negative.   Neurological: Negative.   Psychiatric/Behavioral: Negative.        Objective:   Physical Exam Constitutional:      General: He is not in acute distress.    Appearance: He is well-developed. He is not diaphoretic.  HENT:     Head: Normocephalic and atraumatic.     Right Ear: External ear normal.     Left Ear: External ear normal.     Nose: Nose normal.     Mouth/Throat:     Pharynx: No oropharyngeal exudate.  Eyes:     General: No scleral icterus.       Right eye: No discharge.        Left eye: No discharge.     Conjunctiva/sclera: Conjunctivae normal.     Pupils: Pupils are equal, round, and reactive to light.  Neck:     Musculoskeletal: Neck supple.     Thyroid: No thyromegaly.     Vascular: No JVD.     Trachea: No tracheal deviation.  Cardiovascular:     Rate and Rhythm: Normal rate and regular rhythm.     Heart sounds: Normal heart sounds. No murmur. No friction rub. No gallop.   Pulmonary:     Effort: Pulmonary effort is normal. No respiratory distress.     Breath sounds: Normal breath sounds. No wheezing or rales.  Chest:     Chest wall: No tenderness.  Abdominal:     General: Bowel sounds are normal. There is no distension.     Palpations: Abdomen is soft. There is no mass.     Tenderness: There is no abdominal tenderness. There is no  guarding or rebound.  Genitourinary:    Penis: Normal. No tenderness.      Prostate: Normal.     Rectum: Normal. Guaiac result negative.  Musculoskeletal: Normal range of motion.     Comments: He is tender in the right upper back just inferior to the right scapula. No spinal tenderness and ROOM is intact   Lymphadenopathy:     Cervical: No cervical adenopathy.  Skin:    General: Skin is warm and dry.     Coloration: Skin is not pale.     Findings: No erythema or rash.  Neurological:     Mental Status: He is alert and oriented to person, place, and time.     Cranial Nerves: No cranial nerve deficit.     Motor: No abnormal muscle tone.     Coordination: Coordination normal.     Deep Tendon Reflexes: Reflexes are normal and symmetric. Reflexes normal.  Psychiatric:        Behavior: Behavior normal.        Thought Content: Thought content normal.        Judgment: Judgment normal.  Assessment & Plan:  His HTN is stable. GERD is stable. Get fasting labs to check lipids, A1c, etc. The back pain seems to come from a pinched nerve in the back, so we will set up an MRI of the thoracic spine to evaluate. Use Ibuprofen prn.  [name], MD

## 2018-02-27 ENCOUNTER — Encounter: Payer: Self-pay | Admitting: *Deleted

## 2018-03-06 ENCOUNTER — Other Ambulatory Visit: Payer: Medicare Other

## 2018-03-12 ENCOUNTER — Telehealth: Payer: Self-pay | Admitting: Family Medicine

## 2018-03-12 NOTE — Telephone Encounter (Signed)
Copied from [place] [id number]. Topic: General - Other >> Mar 12, 2018 10:15 AM Leward Quan A wrote: Reason for CRM: Patient called to request to speak with Dr Barbie Banner nurse. Asking for a call back to his home phone number. He is requesting a copy of his lab reports sent to him by mail to address on file. Please assist thank you.  Ph# [phone]

## 2018-03-13 NOTE — Telephone Encounter (Signed)
Pt called to check status of request. Advised we would try to complete in the next few days.

## 2018-03-19 ENCOUNTER — Encounter: Payer: Self-pay | Admitting: Family Medicine

## 2018-03-20 NOTE — Telephone Encounter (Signed)
Pt calling to check status on getting his lab results mailed to him and he is requesting a call back in regards.

## 2018-03-21 ENCOUNTER — Telehealth: Payer: Self-pay | Admitting: Family Medicine

## 2018-03-21 NOTE — Telephone Encounter (Signed)
ROI fax to [facility]  for records

## 2018-04-03 NOTE — Progress Notes (Signed)
Cardiology Office Note   Date:  04/04/2018   ID:  Luke [name], Luke [name] 1945-06-09, MRN [id number]  PCP:  Luke Morale, MD  Cardiologist:   No primary care provider on file. Referring:  Luke Morale, MD   Chief Complaint  Patient presents with  . Palpitations      History of Present Illness: Luke [name] is a 73 y.o. male who presents for follow up of atrial fib.    He was found to have atrial fib in the context of a URI.  He had a negative ischemia work up and unremarkable echo  He did not want to continue with the [place].     Since I last saw him he is done okay.  He never really felt the flutter when he was in it but he does not think he has had it since he was 2 event.  He thinks that was related to taking a bunch cold medicines and having an upper respiratory infection.  He has never noticed his heart rate beating fast for any sustained period.  He does occasionally have some rare palpitations.  He might occasionally have a nervous feeling in his chest.  He does have any presyncope or syncope.  He denies any chest pressure, neck or arm discomfort.  He has no shortness of breath, PND or orthopnea.  Of note he has been traveling quite a bit to [place] in [place].  Past Medical History:  Diagnosis Date  . Asthma   . Depression   . ED (erectile dysfunction)   . Hyperlipidemia   . Hypertension   . Paroxysmal atrial fibrillation ([place]) 09/2016   Initial diagnosis was in setting of acute URI/asthma attack  . Solitary pulmonary nodule 04/16/2013   03/30/13  CXR Question left nipple shadow.  10 mm ovoid nodular density right upper lobe, cannot exclude  pulmonary mass/ nodule > f/u cxr 04/29/2013 > no nodule    . Ulcer     Past Surgical History:  Procedure Laterality Date  . COLONOSCOPY  04/27/2014   per [facility], repeat in 5 yrs   . NM MYOVIEW LTD  10/2016   ailable, images/films reviewed: From Epic Chart or Care Everywhere)  . TRANSTHORACIC  ECHOCARDIOGRAM  37/6283   Normal systolic function. EF 5560%. No RWMA. Gr 1 DD. Mild AoV calcification - no stenosis. Atrial Septum - lipomatous hypertrophy. --Essentially normal.     Current Outpatient Medications  Medication Sig Dispense Refill  . acyclovir (ZOVIRAX) 200 MG capsule TAKE 1 CAPSULE DAILY 90 capsule 3  . albuterol (PROVENTIL HFA;VENTOLIN HFA) 108 (90 Base) MCG/ACT inhaler Inhale 2 puffs into the lungs every 6 (six) hours as needed for wheezing or shortness of breath.    Marland Kitchen amLODipine (NORVASC) 10 MG tablet Take 1 tablet (10 mg total) by mouth daily. 90 tablet 3  . aspirin EC 81 MG tablet Take 1 tablet (81 mg total) by mouth daily. 90 tablet 3  . atorvastatin (LIPITOR) 10 MG tablet Take 1 tablet (10 mg total) by mouth at bedtime. 90 tablet 3  . carvedilol (COREG) 6.25 MG tablet Take 1 tablet (6.25 mg total) by mouth 2 (two) times daily with a meal. 180 tablet 3  . fluticasone (FLONASE) 50 MCG/ACT nasal spray Place 2 sprays into both nostrils daily as needed for allergies or rhinitis. 32 g 6  . losartan (COZAAR) 100 MG tablet Take 1 tablet (100 mg total) by mouth daily. 90 tablet 3  .  metaxalone (SKELAXIN) 800 MG tablet Take 1 tablet (800 mg total) by mouth 4 (four) times daily. (Patient taking differently: Take 800 mg by mouth as needed. ) 60 tablet 2  . omeprazole (PRILOSEC) 20 MG capsule Take 1 capsule (20 mg total) by mouth daily. 90 capsule 3  . sildenafil (VIAGRA) 50 MG tablet Take 1 tablet (50 mg total) by mouth as needed. 10 tablet 11  . triamcinolone cream (KENALOG) 0.1 % Apply 1 application topically 2 (two) times daily. 45 g 2   No current facility-administered medications for this visit.     Allergies:   Aspirin; Hydrochlorothiazide; Irbesartan; and Symbicort [budesonide-formoterol fumarate]    ROS:  Please see the history of present illness.   Otherwise, review of systems are positive for none.   All other systems are reviewed and negative.    PHYSICAL  EXAM: VS:  BP (!) 145/76   Pulse (!) 56   Ht 5\' 7"  (1.702 m)   Wt 193 lb 3.2 oz (87.6 kg)   BMI 30.26 kg/m  , BMI Body mass index is 30.26 kg/m. GENERAL:  Well appearing NECK:  No jugular venous distention, waveform within normal limits, carotid upstroke brisk and symmetric, no bruits, no thyromegaly LUNGS:  Clear to auscultation bilaterally CHEST:  Unremarkable HEART:  PMI not displaced or sustained,S1 and S2 within normal limits, no S3, no S4, no clicks, no rubs, no murmurs ABD:  Flat, positive bowel sounds normal in frequency in pitch, no bruits, no rebound, no guarding, no midline pulsatile mass, no hepatomegaly, no splenomegaly EXT:  2 plus pulses throughout, no edema, no cyanosis no clubbing   EKG:  EKG is  ordered today. Sinus bradycardia, rate 56, right bundle branch block, no acute ST-T wave changes.  Recent Labs: 02/24/2018: ALT 14; BUN 16; Creatinine, Ser 1.01; Hemoglobin 14.5; Platelets 303.0; Potassium 4.8; Sodium 142; TSH 1.84    Lipid Panel    Component Value Date/Time   CHOL 134 02/24/2018 1210   TRIG 173.0 (H) 02/24/2018 1210   HDL 31.00 (L) 02/24/2018 1210   CHOLHDL 4 02/24/2018 1210   VLDL 34.6 02/24/2018 1210   LDLCALC 69 02/24/2018 1210   LDLDIRECT 73.0 02/08/2015 0906      Wt Readings from Last 3 Encounters:  04/04/18 193 lb 3.2 oz (87.6 kg)  02/24/18 191 lb 2 oz (86.7 kg)  09/04/17 188 lb 3.2 oz (85.4 kg)      Other studies Reviewed: Additional studies/ records that were reviewed today include:  None Review of the above records demonstrates:     ASSESSMENT AND PLAN:   ATRIAL FIB: We had a long discussion about atrial fibrillation. Mr. Luke [name] has a CHA2DS2 - VASc score of 2.   This seemed to be an isolated event.  He did not want to take DOAC.  There is been no indication of recurrent arrhythmias.  I think it would be prudent for him to have a Apple Watch, Fitbit for [place] [place].  I reviewed all 3 of these with him.  RBBB: No change  in therapy.  HTN: The blood pressure is slightly elevated but he says this is unusual and it has not been elevated at his most recent office visits.  He can keep an eye on this.  I suggested few pounds of weight loss and increased physical activity and to let me know if he is consistently above 130/80.   Current medicines are reviewed at length with the patient today.  The patient does  not have concerns regarding medicines.  The following changes have been made:  no change  Labs/ tests ordered today include: None No orders of the defined types were placed in this encounter.    Disposition:   FU with me in 18 months or sooner if needed.      Signed, Minus Breeding, MD  04/04/2018 3:43 PM    [facility]

## 2018-04-04 ENCOUNTER — Encounter: Payer: Self-pay | Admitting: Cardiology

## 2018-04-04 ENCOUNTER — Ambulatory Visit (INDEPENDENT_AMBULATORY_CARE_PROVIDER_SITE_OTHER): Payer: Medicare Other | Admitting: Cardiology

## 2018-04-04 VITALS — BP 145/76 | HR 56 | Ht 67.0 in | Wt 193.2 lb

## 2018-04-04 DIAGNOSIS — R002 Palpitations: Secondary | ICD-10-CM | POA: Diagnosis not present

## 2018-04-04 NOTE — Patient Instructions (Signed)
Medication Instructions:  Continue current medications  If you need a refill on your cardiac medications before your next appointment, please call your pharmacy.  Labwork: None Ordered   Take the provided lab slips with you to the lab for your blood draw.   When you have your labs (blood work) drawn today and your tests are completely normal, you will receive your results only by MyChart Message (if you have MyChart) -OR-  A paper copy in the mail.  If you have any lab test that is abnormal or we need to change your treatment, we will call you to review these results.  Testing/Procedures: None Ordered  Special Instructions: ALIVECOR  Follow-Up: You will need a follow up appointment in 18 months.  Please call our office 2 months in advance to schedule this appointment.  You may see Dr Percival Spanish or one of the following Advanced Practice Providers on your designated Care Team:   Rosaria Ferries, PA-C . Jory Sims, DNP, ANP    At [facility], you and your health needs are our priority.  As part of our continuing mission to provide you with exceptional heart care, we have created designated Provider Care Teams.  These Care Teams include your primary Cardiologist (physician) and Advanced Practice Providers (APPs -  Physician Assistants and Nurse Practitioners) who all work together to provide you with the care you need, when you need it.   Thank you for choosing [facility] at [facility]!!

## 2018-04-11 ENCOUNTER — Encounter: Payer: Self-pay | Admitting: Family Medicine

## 2018-04-11 NOTE — Telephone Encounter (Signed)
Dr. Sarajane Jews please advise on the colonoscopy from [facility].  thanks

## 2018-04-14 NOTE — Telephone Encounter (Signed)
No I have not received any notes about his colonoscopy. Yes I did the referral fro Luke [name] on 03-25-18.

## 2018-04-19 ENCOUNTER — Encounter (HOSPITAL_COMMUNITY): Payer: Self-pay

## 2018-04-19 ENCOUNTER — Emergency Department (HOSPITAL_COMMUNITY): Payer: Medicare Other

## 2018-04-19 ENCOUNTER — Inpatient Hospital Stay (HOSPITAL_COMMUNITY)
Admission: EM | Admit: 2018-04-19 | Discharge: 2018-04-21 | DRG: 066 | Disposition: A | Discharge status: Home / Self Care | Payer: Medicare Other | Attending: Internal Medicine | Admitting: Internal Medicine

## 2018-04-19 ENCOUNTER — Observation Stay (HOSPITAL_COMMUNITY): Payer: Medicare Other

## 2018-04-19 ENCOUNTER — Telehealth: Payer: Self-pay | Admitting: Physician Assistant

## 2018-04-19 ENCOUNTER — Other Ambulatory Visit: Payer: Self-pay

## 2018-04-19 DIAGNOSIS — I6523 Occlusion and stenosis of bilateral carotid arteries: Secondary | ICD-10-CM | POA: Diagnosis not present

## 2018-04-19 DIAGNOSIS — Z8711 Personal history of peptic ulcer disease: Secondary | ICD-10-CM

## 2018-04-19 DIAGNOSIS — E785 Hyperlipidemia, unspecified: Secondary | ICD-10-CM | POA: Diagnosis present

## 2018-04-19 DIAGNOSIS — I6389 Other cerebral infarction: Principal | ICD-10-CM | POA: Diagnosis present

## 2018-04-19 DIAGNOSIS — F329 Major depressive disorder, single episode, unspecified: Secondary | ICD-10-CM | POA: Diagnosis present

## 2018-04-19 DIAGNOSIS — Z886 Allergy status to analgesic agent status: Secondary | ICD-10-CM

## 2018-04-19 DIAGNOSIS — R200 Anesthesia of skin: Secondary | ICD-10-CM | POA: Diagnosis not present

## 2018-04-19 DIAGNOSIS — I639 Cerebral infarction, unspecified: Secondary | ICD-10-CM | POA: Diagnosis present

## 2018-04-19 DIAGNOSIS — Z7982 Long term (current) use of aspirin: Secondary | ICD-10-CM

## 2018-04-19 DIAGNOSIS — H538 Other visual disturbances: Secondary | ICD-10-CM | POA: Diagnosis not present

## 2018-04-19 DIAGNOSIS — Z823 Family history of stroke: Secondary | ICD-10-CM

## 2018-04-19 DIAGNOSIS — I10 Essential (primary) hypertension: Secondary | ICD-10-CM | POA: Diagnosis present

## 2018-04-19 DIAGNOSIS — Z8249 Family history of ischemic heart disease and other diseases of the circulatory system: Secondary | ICD-10-CM

## 2018-04-19 DIAGNOSIS — I480 Paroxysmal atrial fibrillation: Secondary | ICD-10-CM | POA: Diagnosis present

## 2018-04-19 DIAGNOSIS — E042 Nontoxic multinodular goiter: Secondary | ICD-10-CM | POA: Diagnosis present

## 2018-04-19 DIAGNOSIS — Z8349 Family history of other endocrine, nutritional and metabolic diseases: Secondary | ICD-10-CM

## 2018-04-19 DIAGNOSIS — K219 Gastro-esophageal reflux disease without esophagitis: Secondary | ICD-10-CM | POA: Diagnosis present

## 2018-04-19 DIAGNOSIS — Z825 Family history of asthma and other chronic lower respiratory diseases: Secondary | ICD-10-CM

## 2018-04-19 DIAGNOSIS — E782 Mixed hyperlipidemia: Secondary | ICD-10-CM

## 2018-04-19 DIAGNOSIS — R918 Other nonspecific abnormal finding of lung field: Secondary | ICD-10-CM | POA: Diagnosis present

## 2018-04-19 DIAGNOSIS — Z801 Family history of malignant neoplasm of trachea, bronchus and lung: Secondary | ICD-10-CM

## 2018-04-19 DIAGNOSIS — Z888 Allergy status to other drugs, medicaments and biological substances status: Secondary | ICD-10-CM

## 2018-04-19 DIAGNOSIS — Z79899 Other long term (current) drug therapy: Secondary | ICD-10-CM

## 2018-04-19 DIAGNOSIS — J45909 Unspecified asthma, uncomplicated: Secondary | ICD-10-CM | POA: Diagnosis not present

## 2018-04-19 DIAGNOSIS — R29700 NIHSS score 0: Secondary | ICD-10-CM | POA: Diagnosis present

## 2018-04-19 LAB — COMPREHENSIVE METABOLIC PANEL
ALT: 14 U/L (ref 0–44)
AST: 15 U/L (ref 15–41)
Albumin: 3.5 g/dL (ref 3.5–5.0)
Alkaline Phosphatase: 82 U/L (ref 38–126)
Anion gap: 8 (ref 5–15)
BUN: 15 mg/dL (ref 8–23)
CO2: 23 mmol/L (ref 22–32)
Calcium: 9.1 mg/dL (ref 8.9–10.3)
Chloride: 109 mmol/L (ref 98–111)
Creatinine, Ser: 1.16 mg/dL (ref 0.61–1.24)
GFR calc Af Amer: >60 mL/min (ref >60)
GFR calc non Af Amer: >60 mL/min (ref >60)
Glucose, Bld: 134 mg/dL — ABNORMAL HIGH (ref 70–99)
Potassium: 4.2 mmol/L (ref 3.5–5.1)
SODIUM: 140 mmol/L (ref 135–145)
Total Bilirubin: 0.5 mg/dL (ref 0.3–1.2)
Total Protein: 6.8 g/dL (ref 6.5–8.1)

## 2018-04-19 LAB — CBC
HCT: 41.9 % (ref 39.0–52.0)
Hemoglobin: 13.8 g/dL (ref 13.0–17.0)
MCH: 28.1 pg (ref 26.0–34.0)
MCHC: 32.9 g/dL (ref 30.0–36.0)
MCV: 85.3 fL (ref 80.0–100.0)
Platelets: 322 10*3/uL (ref 150–400)
RBC: 4.91 MIL/uL (ref 4.22–5.81)
RDW: 13.1 % (ref 11.5–15.5)
WBC: 10.3 10*3/uL (ref 4.0–10.5)
nRBC: 0 % (ref 0.0–0.2)

## 2018-04-19 LAB — DIFFERENTIAL
Abs Immature Granulocytes: 0.03 10*3/uL (ref 0.00–0.07)
BASOS PCT: 1 %
Basophils Absolute: 0.1 10*3/uL (ref 0.0–0.1)
Eosinophils Absolute: 0.6 10*3/uL — ABNORMAL HIGH (ref 0.0–0.5)
Eosinophils Relative: 6 %
IMMATURE GRANULOCYTES: 0 %
Lymphocytes Relative: 21 %
Lymphs Abs: 2.1 10*3/uL (ref 0.7–4.0)
Monocytes Absolute: 0.7 10*3/uL (ref 0.1–1.0)
Monocytes Relative: 7 %
Neutro Abs: 6.8 10*3/uL (ref 1.7–7.7)
Neutrophils Relative %: 65 %

## 2018-04-19 LAB — PROTIME-INR
INR: 1.02
Prothrombin Time: 13.3 seconds (ref 11.4–15.2)

## 2018-04-19 LAB — APTT: APTT: 32 s (ref 24–36)

## 2018-04-19 MED ORDER — HEPARIN SODIUM (PORCINE) 5000 UNIT/ML IJ SOLN
5000.0000 [IU] | Freq: Three times a day (TID) | INTRAMUSCULAR | Status: DC
  Administered 2018-04-19 – 2018-04-20 (×2): 5000 [IU] via SUBCUTANEOUS
  Filled 2018-04-19 (×2): qty 1

## 2018-04-19 MED ORDER — PANTOPRAZOLE SODIUM 40 MG PO TBEC
40.0000 mg | DELAYED_RELEASE_TABLET | Freq: Every day | ORAL | Status: DC
  Administered 2018-04-20 – 2018-04-21 (×2): 40 mg via ORAL
  Filled 2018-04-19 (×2): qty 1

## 2018-04-19 MED ORDER — IOPAMIDOL (ISOVUE-370) INJECTION 76%
INTRAVENOUS | Status: AC
  Filled 2018-04-19: qty 100

## 2018-04-19 MED ORDER — IOPAMIDOL (ISOVUE-370) INJECTION 76%
100.0000 mL | Freq: Once | INTRAVENOUS | Status: AC | PRN
  Administered 2018-04-19: 100 mL via INTRAVENOUS

## 2018-04-19 MED ORDER — ASPIRIN 325 MG PO TABS
325.0000 mg | ORAL_TABLET | Freq: Every day | ORAL | Status: DC
  Administered 2018-04-20: 325 mg via ORAL
  Filled 2018-04-19: qty 1

## 2018-04-19 MED ORDER — STROKE: EARLY STAGES OF RECOVERY BOOK: Freq: Once | Status: DC

## 2018-04-19 MED ORDER — ACYCLOVIR 200 MG PO CAPS
200.0000 mg | ORAL_CAPSULE | Freq: Every day | ORAL | Status: DC
  Administered 2018-04-20 – 2018-04-21 (×2): 200 mg via ORAL
  Filled 2018-04-19 (×2): qty 1

## 2018-04-19 MED ORDER — SODIUM CHLORIDE 0.9% FLUSH
3.0000 mL | Freq: Once | INTRAVENOUS | Status: DC
Start: 2018-04-19 — End: 2018-04-21

## 2018-04-19 MED ORDER — METAXALONE 800 MG PO TABS
800.0000 mg | ORAL_TABLET | Freq: Two times a day (BID) | ORAL | Status: DC | PRN
  Administered 2018-04-21: 800 mg via ORAL
  Filled 2018-04-19 (×2): qty 1

## 2018-04-19 MED ORDER — SENNOSIDES-DOCUSATE SODIUM 8.6-50 MG PO TABS: 1.0000 | ORAL_TABLET | Freq: Every evening | ORAL | Status: DC | PRN

## 2018-04-19 MED ORDER — ACETAMINOPHEN 325 MG PO TABS: 650.0000 mg | ORAL_TABLET | ORAL | Status: DC | PRN

## 2018-04-19 MED ORDER — ACETAMINOPHEN 650 MG RE SUPP: 650.0000 mg | RECTAL | Status: DC | PRN

## 2018-04-19 MED ORDER — ATORVASTATIN CALCIUM 80 MG PO TABS
80.0000 mg | ORAL_TABLET | Freq: Every day | ORAL | Status: DC
  Administered 2018-04-20: 80 mg via ORAL
  Filled 2018-04-19: qty 1

## 2018-04-19 MED ORDER — ACETAMINOPHEN 160 MG/5ML PO SOLN: 650.0000 mg | ORAL | Status: DC | PRN

## 2018-04-19 MED ORDER — ASPIRIN 300 MG RE SUPP: 300.0000 mg | Freq: Every day | RECTAL | Status: DC

## 2018-04-19 MED ORDER — ALBUTEROL SULFATE (2.5 MG/3ML) 0.083% IN NEBU: 2.5000 mg | INHALATION_SOLUTION | Freq: Four times a day (QID) | RESPIRATORY_TRACT | Status: DC | PRN

## 2018-04-19 NOTE — ED Notes (Signed)
Patient transported to MRI 

## 2018-04-19 NOTE — Consult Note (Signed)
Neurology Consultation  Reason for Consult: Subacute strokes on MRI, blurred vision Referring Physician: *Dr. Venora Maples  CC: Blurred vision  History is obtained from: Patient, wife, chart  HPI: Luke [name] is a 73 y.o. male past medical history of paroxysmal atrial fibrillation not on anticoagulation, hypertension, hyperlipidemia who presented to the emergency room for 3 to 4 days worth of blurred vision through his left eye.  He said that he started noticing symptoms all of a sudden on Thursday and did not make much of it.  His wife brought him into the emergency room because the symptoms were persistent. He denies any focal tingling numbness weakness.  He reports some right eye heaviness and may be drooping eyelids. Denies any chest pain nausea vomiting shortness of breath. Has not had any symptoms of stroke or strokes in the past. He was told that he has atrial fibrillation that comes and goes and anticoagulation was discussed but he chose not to be on anticoagulation because of the side effects.  He is a retired Land.  Also veteran of the [employer].  In ER, MRI brain was done that showed scattered punctate embolic-looking infarcts in the right cerebral hemisphere.  No bleed.  LKW: At least 3 days ago tpa given?: no, outside the window Premorbid modified Rankin scale (mRS): 0  ROS: ROS was performed and is negative except as noted in the HPI.    Past Medical History:  Diagnosis Date  . Asthma   . Depression   . ED (erectile dysfunction)   . Hyperlipidemia   . Hypertension   . Paroxysmal atrial fibrillation ([place]) 09/2016   Initial diagnosis was in setting of acute URI/asthma attack  . Solitary pulmonary nodule 04/16/2013   03/30/13  CXR Question left nipple shadow.  10 mm ovoid nodular density right upper lobe, cannot exclude  pulmonary mass/ nodule > f/u cxr 04/29/2013 > no nodule    . Ulcer     Family History  Problem Relation Age of Onset  . Stroke  Mother   . Hypertension Mother   . Peripheral vascular disease Mother   . Hyperlipidemia Father   . Coronary artery disease Father   . Peripheral vascular disease Father   . Hypertension Brother   . Coronary artery disease Brother   . Cancer Brother        throat, smoked  . Emphysema Brother        smoked  . Lung cancer Brother        smoked    Social History:   reports that he has never smoked. He has never used smokeless tobacco. He reports current alcohol use of about 2.0 standard drinks of alcohol per week. He reports that he does not use drugs.  Medications  Current Facility-Administered Medications:  .  sodium chloride flush (NS) 0.9 % injection 3 mL, 3 mL, Intravenous, Once, Jola Schmidt, MD  Current Outpatient Medications:  .  acyclovir (ZOVIRAX) 200 MG capsule, TAKE 1 CAPSULE DAILY (Patient taking differently: Take 200 mg by mouth daily. ), Disp: 90 capsule, Rfl: 3 .  albuterol (PROVENTIL HFA;VENTOLIN HFA) 108 (90 Base) MCG/ACT inhaler, Inhale 2 puffs into the lungs every 6 (six) hours as needed for wheezing or shortness of breath., Disp: , Rfl:  .  amLODipine (NORVASC) 10 MG tablet, Take 1 tablet (10 mg total) by mouth daily., Disp: 90 tablet, Rfl: 3 .  aspirin EC 81 MG tablet, Take 1 tablet (81 mg total) by mouth daily., Disp:  90 tablet, Rfl: 3 .  atorvastatin (LIPITOR) 10 MG tablet, Take 1 tablet (10 mg total) by mouth at bedtime., Disp: 90 tablet, Rfl: 3 .  carvedilol (COREG) 6.25 MG tablet, Take 1 tablet (6.25 mg total) by mouth 2 (two) times daily with a meal., Disp: 180 tablet, Rfl: 3 .  losartan (COZAAR) 100 MG tablet, Take 1 tablet (100 mg total) by mouth daily., Disp: 90 tablet, Rfl: 3 .  metaxalone (SKELAXIN) 800 MG tablet, Take 1 tablet (800 mg total) by mouth 4 (four) times daily. (Patient taking differently: Take 800 mg by mouth 2 (two) times daily as needed for muscle spasms. ), Disp: 60 tablet, Rfl: 2 .  omeprazole (PRILOSEC) 20 MG capsule, Take 1 capsule  (20 mg total) by mouth daily., Disp: 90 capsule, Rfl: 3 .  sildenafil (VIAGRA) 50 MG tablet, Take 1 tablet (50 mg total) by mouth as needed. (Patient taking differently: Take 50 mg by mouth as needed for erectile dysfunction. ), Disp: 10 tablet, Rfl: 11 .  triamcinolone cream (KENALOG) 0.1 %, Apply 1 application topically 2 (two) times daily. (Patient taking differently: Apply 1 application topically 2 (two) times daily as needed (rash). ), Disp: 45 g, Rfl: 2  Exam: Current vital signs: BP (!) 155/77 (BP Location: Right Arm)   Pulse 68   Temp 97.8 F (36.6 C) (Oral)   Resp 16   SpO2 100%  Vital signs in last 24 hours: Temp:  [97.8 F (36.6 C)] 97.8 F (36.6 C) (02/15 1511) Pulse Rate:  [58-68] 68 (02/15 1926) Resp:  [14-22] 16 (02/15 1926) BP: (145-160)/(77-89) 155/77 (02/15 1926) SpO2:  [97 %-100 %] 100 % (02/15 1926)  GENERAL: Awake, alert in NAD HEENT: - Normocephalic and atraumatic, dry mm, no LN++, no Thyromegally LUNGS - Clear to auscultation bilaterally with no wheezes CV - S1S2 RRR, no m/r/g, equal pulses bilaterally. ABDOMEN - Soft, nontender, nondistended with normoactive BS Ext: warm, well perfused, intact peripheral pulses, trace edema in both lower extremities  NEURO:  Mental Status: AA&Ox3  Language: speech is non-dysarthric.  Naming, repetition, fluency, and comprehension intact. Cranial Nerves: PERRL.  Possibly mild ptosis of the right eyelid.  Reports blurred vision in the left eye but no decrease in visual acuity on testing in any of the eyes.  EOMI, visual fields full, no facial asymmetry, facial sensation intact, hearing intact, tongue/uvula/soft palate midline, normal sternocleidomastoid and trapezius muscle strength. No evidence of tongue atrophy or fibrillations Motor: 5/5 with no drift. Tone: is normal and bulk is normal Sensation- Intact to light touch bilaterally.  No extinction Coordination: FTN intact bilaterally, no ataxia in BLE. Gait-  deferred  NIHSS-0   Labs I have reviewed labs in epic and the results pertinent to this consultation are:  CBC    Component Value Date/Time   WBC 10.3 04/19/2018 1524   RBC 4.91 04/19/2018 1524   HGB 13.8 04/19/2018 1524   HCT 41.9 04/19/2018 1524   PLT 322 04/19/2018 1524   MCV 85.3 04/19/2018 1524   MCH 28.1 04/19/2018 1524   MCHC 32.9 04/19/2018 1524   RDW 13.1 04/19/2018 1524   LYMPHSABS 2.1 04/19/2018 1524   MONOABS 0.7 04/19/2018 1524   EOSABS 0.6 (H) 04/19/2018 1524   BASOSABS 0.1 04/19/2018 1524    CMP     Component Value Date/Time   NA 140 04/19/2018 1524   K 4.2 04/19/2018 1524   CL 109 04/19/2018 1524   CO2 23 04/19/2018 1524   GLUCOSE 134 (H)  04/19/2018 1524   BUN 15 04/19/2018 1524   CREATININE 1.16 04/19/2018 1524   CALCIUM 9.1 04/19/2018 1524   PROT 6.8 04/19/2018 1524   ALBUMIN 3.5 04/19/2018 1524   AST 15 04/19/2018 1524   ALT 14 04/19/2018 1524   ALKPHOS 82 04/19/2018 1524   BILITOT 0.5 04/19/2018 1524   GFRNONAA >60 04/19/2018 1524   GFRAA >60 04/19/2018 1524    Lipid Panel     Component Value Date/Time   CHOL 134 02/24/2018 1210   TRIG 173.0 (H) 02/24/2018 1210   HDL 31.00 (L) 02/24/2018 1210   CHOLHDL 4 02/24/2018 1210   VLDL 34.6 02/24/2018 1210   LDLCALC 69 02/24/2018 1210   LDLDIRECT 73.0 02/08/2015 0906     Imaging I have reviewed the images obtained: MRI examination of the brain-scattered embolic-looking punctate infarcts in the right cerebral hemisphere. MRA head negative  Assessment: 73 year old man with past medical history of paroxysmal atrial fibrillation not on any anticoagulation by choice, presenting for 3 to 4 days history of blurred vision in the left eye.  MRI of the brain consistent with punctate infarcts in the right cerebral hemisphere-likely cardioembolic. Recommend admission for stroke work-up.   Impression:  Acute ischemic stroke-likely cardioembolic Possible right diagnosis-evaluate for carotid  dissection  Recommendations: -Admit to hospitalist -Telemetry monitoring -Allow for permissive hypertension for the first 24-48h - only treat PRN if SBP >220 mmHg. Blood pressures can be gradually normalized to SBP<140 upon discharge. -CT Angiogram of Head and neck due to concern for dissection because of ptosis in the right eye. -Echocardiogram -HgbA1c, fasting lipid panel -Frequent neuro checks -Prophylactic therapy-Antiplatelet med: Aspirin - dose 325mg  PO or 300mg  PR.  Long-term will need oral anticoagulation.  Strokes is a very small and anticoagulation can be started within 3 to 5 days of stroke.  Stroke team to provide final recommendations. -Atorvastatin 80 mg PO daily -Risk factor modification -I discussed the importance of exercise -PT consult, OT consult, Speech consult  Please page stroke NP/PA/MD (listed on AMION)  from 8am-4 pm as this patient will be followed by the stroke team at this point.    -- Amie Portland, MD [facility] Pager: [phone] If 7pm to 7am, please call on call as listed on AMION.

## 2018-04-19 NOTE — ED Provider Notes (Addendum)
[facility] Provider Note   CSN: [id number] Arrival date & time: 04/19/18  1500     History   Chief Complaint Chief Complaint  Patient presents with  . Eye Problem  . Numbness    HPI JAYMIE MISCH is a 73 y.o. male.  HPI Patient is a 73 year old male with no prior history of stroke who presents the emergency department with right eye blurred vision over the past 4 days as well as some mild left-sided facial numbness.  His symptoms have been constant.  Denies weakness of his arms or legs.  No ataxia.  No difficulty with his speech.  No memory issues.  He does have a history of paroxysmal A. fib which point he was recommended to be on anticoagulants by his cardiology team but the patient deferred.  Denies fevers and chills.  No headache.  Symptoms are mild to moderate in severity.  Came today because his blurred vision was not improving and he spoke with his cardiologist who recommended he come to the ER for further evaluation.   Past Medical History:  Diagnosis Date  . Asthma   . Depression   . ED (erectile dysfunction)   . Hyperlipidemia   . Hypertension   . Paroxysmal atrial fibrillation ([place]) 09/2016   Initial diagnosis was in setting of acute URI/asthma attack  . Solitary pulmonary nodule 04/16/2013   03/30/13  CXR Question left nipple shadow.  10 mm ovoid nodular density right upper lobe, cannot exclude  pulmonary mass/ nodule > f/u cxr 04/29/2013 > no nodule    . Ulcer     Patient Active Problem List   Diagnosis Date Noted  . BPH with urinary obstruction 02/21/2017  . NSVT (nonsustained ventricular tachycardia) ([place]) 09/27/2016  . New onset atrial fibrillation ([place]) 09/25/2016  . PAF (paroxysmal atrial fibrillation) ([place]) 09/25/2016  . Genital herpes 02/18/2016  . Hyperglycemia 02/18/2016  . Left knee pain 02/01/2015  . Restless leg syndrome 03/30/2013  . Allergic rhinitis 10/31/2009  . Headache 10/31/2009  . DEGENERATIVE  DISC DISEASE, CERVICAL SPINE, W/RADICULOPATHY 07/28/2008  . Essential hypertension 07/08/2007  . Asthma 07/02/2007  . Hyperlipidemia 05/02/2007  . ERECTILE DYSFUNCTION, MILD 05/02/2007  . PVC (premature ventricular contraction) 05/02/2007  . GERD (gastroesophageal reflux disease) 05/02/2007    Past Surgical History:  Procedure Laterality Date  . COLONOSCOPY  04/27/2014   per [facility], repeat in 5 yrs   . NM MYOVIEW LTD  10/2016   ailable, images/films reviewed: From Epic Chart or Care Everywhere)  . TRANSTHORACIC ECHOCARDIOGRAM  43/1540   Normal systolic function. EF 5560%. No RWMA. Gr 1 DD. Mild AoV calcification - no stenosis. Atrial Septum - lipomatous hypertrophy. --Essentially normal.        Home Medications    Prior to Admission medications   Medication Sig Start Date End Date Taking? Authorizing Provider  acyclovir (ZOVIRAX) 200 MG capsule TAKE 1 CAPSULE DAILY Patient taking differently: Take 200 mg by mouth daily.  02/24/18  Yes Laurey Morale, MD  albuterol (PROVENTIL HFA;VENTOLIN HFA) 108 (90 Base) MCG/ACT inhaler Inhale 2 puffs into the lungs every 6 (six) hours as needed for wheezing or shortness of breath.   Yes [provider]  amLODipine (NORVASC) 10 MG tablet Take 1 tablet (10 mg total) by mouth daily. 02/24/18  Yes Laurey Morale, MD  aspirin EC 81 MG tablet Take 1 tablet (81 mg total) by mouth daily. 11/06/16  Yes Leonie Man, MD  atorvastatin (  LIPITOR) 10 MG tablet Take 1 tablet (10 mg total) by mouth at bedtime. 02/24/18  Yes Laurey Morale, MD  carvedilol (COREG) 6.25 MG tablet Take 1 tablet (6.25 mg total) by mouth 2 (two) times daily with a meal. 02/24/18  Yes Laurey Morale, MD  losartan (COZAAR) 100 MG tablet Take 1 tablet (100 mg total) by mouth daily. 02/24/18  Yes Laurey Morale, MD  metaxalone (SKELAXIN) 800 MG tablet Take 1 tablet (800 mg total) by mouth 4 (four) times daily. Patient taking differently: Take 800 mg by mouth 2 (two) times  daily as needed for muscle spasms.  02/21/17  Yes Laurey Morale, MD  omeprazole (PRILOSEC) 20 MG capsule Take 1 capsule (20 mg total) by mouth daily. 02/24/18  Yes Laurey Morale, MD  sildenafil (VIAGRA) 50 MG tablet Take 1 tablet (50 mg total) by mouth as needed. Patient taking differently: Take 50 mg by mouth as needed for erectile dysfunction.  10/24/17  Yes Laurey Morale, MD  triamcinolone cream (KENALOG) 0.1 % Apply 1 application topically 2 (two) times daily. Patient taking differently: Apply 1 application topically 2 (two) times daily as needed (rash).  02/24/18  Yes Laurey Morale, MD    Family History Family History  Problem Relation Age of Onset  . Stroke Mother   . Hypertension Mother   . Peripheral vascular disease Mother   . Hyperlipidemia Father   . Coronary artery disease Father   . Peripheral vascular disease Father   . Hypertension Brother   . Coronary artery disease Brother   . Cancer Brother        throat, smoked  . Emphysema Brother        smoked  . Lung cancer Brother        smoked    Social History Social History   Tobacco Use  . Smoking status: Never Smoker  . Smokeless tobacco: Never Used  Substance Use Topics  . Alcohol use: Yes    Alcohol/week: 2.0 standard drinks    Types: 2 Standard drinks or equivalent per week    Comment: socially occasionally   . Drug use: No     Allergies   Aspirin; Hydrochlorothiazide; Irbesartan; and Symbicort [budesonide-formoterol fumarate]   Review of Systems Review of Systems  All other systems reviewed and are negative.    Physical Exam Updated Vital Signs BP (!) 155/77 (BP Location: Right Arm)   Pulse 68   Temp 97.8 F (36.6 C) (Oral)   Resp 16   SpO2 100%   Physical Exam Vitals signs and nursing note reviewed.  Constitutional:      Appearance: He is well-developed.  HENT:     Head: Normocephalic and atraumatic.  Eyes:     Pupils: Pupils are equal, round, and reactive to light.  Neck:      Musculoskeletal: Normal range of motion.  Cardiovascular:     Rate and Rhythm: Normal rate and regular rhythm.  Pulmonary:     Effort: Pulmonary effort is normal. No respiratory distress.     Breath sounds: Normal breath sounds.  Abdominal:     General: There is no distension.     Palpations: Abdomen is soft.     Tenderness: There is no abdominal tenderness.  Musculoskeletal: Normal range of motion.  Skin:    General: Skin is warm and dry.  Neurological:     Mental Status: He is alert and oriented to person, place, and time.     Comments:  5/5 strength in major muscle groups of  bilateral upper and lower extremities. Speech normal. No facial asymetry.  No obvious visual field cuts.  Patient does have what appears to be horizontal nystagmus      ED Treatments / Results  Labs (all labs ordered are listed, but only abnormal results are displayed) Labs Reviewed  DIFFERENTIAL - Abnormal; Notable for the following components:      Result Value   Eosinophils Absolute 0.6 (*)    All other components within normal limits  COMPREHENSIVE METABOLIC PANEL - Abnormal; Notable for the following components:   Glucose, Bld 134 (*)    All other components within normal limits  PROTIME-INR  APTT  CBC  CBG MONITORING, ED    EKG EKG Interpretation  Date/Time:  Saturday April 19 2018 15:11:17 EST Ventricular Rate:  60 PR Interval:  156 QRS Duration: 142 QT Interval:  442 QTC Calculation: 442 R Axis:   18 Text Interpretation:  Sinus rhythm with Fusion complexes Right bundle branch block Abnormal ECG no afib when compared to prior ecg in July 2018 Confirmed by Jola Schmidt [phone]) on 04/19/2018 7:44:30 PM   Radiology [name] Head Wo Contrast  Result Date: 04/19/2018 CLINICAL DATA:  Right eye blurred vision. Transient left-sided facial and arm numbness last week. EXAM: MRI HEAD WITHOUT CONTRAST MRA HEAD WITHOUT CONTRAST TECHNIQUE: Multiplanar, multiecho pulse sequences of the brain and  surrounding structures were obtained without intravenous contrast. Angiographic images of the head were obtained using MRA technique without contrast. COMPARISON:  None. FINDINGS: MRI HEAD FINDINGS Brain: There are several punctate acute to early subacute cortical infarcts in the right parietal and posterior right frontal lobes. No intracranial hemorrhage, mass, midline shift, or extra-axial fluid collection is identified. Generalized cerebral atrophy is mild for age. Patchy T2 hyperintensities in the cerebral white matter bilaterally are nonspecific but compatible with moderate chronic small vessel ischemic disease. Vascular: Major intracranial vascular flow voids are preserved. Skull and upper cervical spine: Unremarkable bone marrow signal. Sinuses/Orbits: Unremarkable orbits. Minimal left maxillary sinus mucosal thickening. Clear mastoid air cells. Other: None. MRA HEAD FINDINGS There is mild motion artifact. The visualized distal vertebral arteries are patent to the basilar and codominant. Patent PICA and SCA origins are seen bilaterally. The basilar artery is widely patent. There is a small left posterior communicating artery. A right posterior communicating artery is not identified and may be diminutive or absent. Both PCAs are patent without evidence of significant proximal stenosis. The internal carotid arteries are patent from skull base to carotid termini with suspected artifact rather than stenosis near the proximal aspects of both petrous segments. ACAs and MCAs are patent without evidence of proximal branch occlusion or significant stenosis. No aneurysm is identified. IMPRESSION: 1. Punctate acute to early subacute infarcts in the right parietal and posterior frontal lobes. 2. Moderate chronic small vessel ischemic disease. 3. Negative head MRA. Electronically Signed   By: Logan Bores M.D.   On: 04/19/2018 19:03   Mr Brain Wo Contrast  Result Date: 04/19/2018 CLINICAL DATA:  Right eye blurred  vision. Transient left-sided facial and arm numbness last week. EXAM: MRI HEAD WITHOUT CONTRAST MRA HEAD WITHOUT CONTRAST TECHNIQUE: Multiplanar, multiecho pulse sequences of the brain and surrounding structures were obtained without intravenous contrast. Angiographic images of the head were obtained using MRA technique without contrast. COMPARISON:  None. FINDINGS: MRI HEAD FINDINGS Brain: There are several punctate acute to early subacute cortical infarcts in the right parietal and posterior right frontal lobes.  No intracranial hemorrhage, mass, midline shift, or extra-axial fluid collection is identified. Generalized cerebral atrophy is mild for age. Patchy T2 hyperintensities in the cerebral white matter bilaterally are nonspecific but compatible with moderate chronic small vessel ischemic disease. Vascular: Major intracranial vascular flow voids are preserved. Skull and upper cervical spine: Unremarkable bone marrow signal. Sinuses/Orbits: Unremarkable orbits. Minimal left maxillary sinus mucosal thickening. Clear mastoid air cells. Other: None. MRA HEAD FINDINGS There is mild motion artifact. The visualized distal vertebral arteries are patent to the basilar and codominant. Patent PICA and SCA origins are seen bilaterally. The basilar artery is widely patent. There is a small left posterior communicating artery. A right posterior communicating artery is not identified and may be diminutive or absent. Both PCAs are patent without evidence of significant proximal stenosis. The internal carotid arteries are patent from skull base to carotid termini with suspected artifact rather than stenosis near the proximal aspects of both petrous segments. ACAs and MCAs are patent without evidence of proximal branch occlusion or significant stenosis. No aneurysm is identified. IMPRESSION: 1. Punctate acute to early subacute infarcts in the right parietal and posterior frontal lobes. 2. Moderate chronic small vessel ischemic  disease. 3. Negative head MRA. Electronically Signed   By: Logan Bores M.D.   On: 04/19/2018 19:03    Procedures .Critical Care Performed by: Jola Schmidt, MD Authorized by: Jola Schmidt, MD   Critical care provider statement:    Critical care time (minutes):  31   Critical care was time spent personally by me on the following activities:  Discussions with consultants, evaluation of patient's response to treatment, examination of patient, ordering and performing treatments and interventions, ordering and review of laboratory studies, ordering and review of radiographic studies, pulse oximetry, re-evaluation of patient's condition, obtaining history from patient or surrogate and review of old charts   (including critical care time)  Medications Ordered in ED Medications  sodium chloride flush (NS) 0.9 % injection 3 mL (3 mLs Intravenous Not Given 04/19/18 1557)     Initial Impression / Assessment and Plan / ED Course  I have reviewed the triage vital signs and the nursing notes.  Pertinent labs & imaging results that were available during my care of the patient were reviewed by me and considered in my medical decision making (see chart for details).     Acute to subacute stroke found on MRI.  Patient be admitted to hospital for additional stroke work-up.  Likely will be a candidate for anticoagulation given his history of paroxysmal A. fib and his new stroke today.  This will need to be discussed further with the patient.  All questions answered.  Neuro hospitalist consultation.  Hospitalist admission  Final Clinical Impressions(s) / ED Diagnoses   Final diagnoses:  Cerebrovascular accident (CVA), unspecified mechanism [facility])    ED Discharge Orders    None       Jola Schmidt, MD 04/19/18 1945    Jola Schmidt, MD 04/30/18 [phone]

## 2018-04-19 NOTE — ED Notes (Signed)
Patient transported to CT scan . 

## 2018-04-19 NOTE — H&P (Signed)
History and Physical    Luke [name] IHW:[id number] DOB: [birth year] DOA: 04/19/2018  PCP: Laurey Morale, MD Patient coming from: Home  Chief Complaint: Blurry vision, numbness  HPI: Luke [name] is a 73 y.o. male with medical history significant of asthma, depression, hypertension, hyperlipidemia, paroxysmal atrial fibrillation not on anticoagulation presenting to the hospital for evaluation of blurry vision and numbness.  Symptoms started 3 to 4 days ago.  Patient experienced drooping of his right eyelid and blurry vision in that eye.  He also experienced numbness of his left cheek, jaw, and left arm.  States the numbness has now resolved but he continues to have blurry vision in his right eye.  Denies having any headaches.  Denies prior history of stroke.  States he has A. fib and his cardiologist had talked to him about starting a blood thinner but he was hesitant about starting a new medication.  States he had a GI bleed from a stomach ulcer 35 years ago in the setting of taking very large doses of aspirin for tennis elbow.  States he has not had any GI bleeds since then.  Review of Systems: As per HPI otherwise 10 point review of systems negative.  Past Medical History:  Diagnosis Date  . Asthma   . Depression   . ED (erectile dysfunction)   . Hyperlipidemia   . Hypertension   . Paroxysmal atrial fibrillation ([place]) 09/2016   Initial diagnosis was in setting of acute URI/asthma attack  . Solitary pulmonary nodule 04/16/2013   03/30/13  CXR Question left nipple shadow.  10 mm ovoid nodular density right upper lobe, cannot exclude  pulmonary mass/ nodule > f/u cxr 04/29/2013 > no nodule    . Ulcer     Past Surgical History:  Procedure Laterality Date  . COLONOSCOPY  04/27/2014   per [facility], repeat in 5 yrs   . NM MYOVIEW LTD  10/2016   ailable, images/films reviewed: From Epic Chart or Care Everywhere)  . TRANSTHORACIC ECHOCARDIOGRAM  49/1791   Normal systolic function.  EF 5560%. No RWMA. Gr 1 DD. Mild AoV calcification - no stenosis. Atrial Septum - lipomatous hypertrophy. --Essentially normal.     reports that he has never smoked. He has never used smokeless tobacco. He reports current alcohol use of about 2.0 standard drinks of alcohol per week. He reports that he does not use drugs.  Allergies  Allergen Reactions  . Aspirin Other (See Comments)    REACTION: ULCER @LARGE  DOSES  . Hydrochlorothiazide Other (See Comments)    Leg cramps  . Irbesartan Other (See Comments)    Headaches   . Symbicort [Budesonide-Formoterol Fumarate] Other (See Comments)    legcramps    Family History  Problem Relation Age of Onset  . Stroke Mother   . Hypertension Mother   . Peripheral vascular disease Mother   . Hyperlipidemia Father   . Coronary artery disease Father   . Peripheral vascular disease Father   . Hypertension Brother   . Coronary artery disease Brother   . Cancer Brother        throat, smoked  . Emphysema Brother        smoked  . Lung cancer Brother        smoked    Prior to Admission medications   Medication Sig Start Date End Date Taking? Authorizing Provider  acyclovir (ZOVIRAX) 200 MG capsule TAKE 1 CAPSULE DAILY Patient taking differently: Take 200 mg by mouth daily.  02/24/18  Yes Laurey Morale, MD  albuterol (PROVENTIL HFA;VENTOLIN HFA) 108 (90 Base) MCG/ACT inhaler Inhale 2 puffs into the lungs every 6 (six) hours as needed for wheezing or shortness of breath.   Yes [provider]  amLODipine (NORVASC) 10 MG tablet Take 1 tablet (10 mg total) by mouth daily. 02/24/18  Yes Laurey Morale, MD  aspirin EC 81 MG tablet Take 1 tablet (81 mg total) by mouth daily. 11/06/16  Yes Leonie Man, MD  atorvastatin (LIPITOR) 10 MG tablet Take 1 tablet (10 mg total) by mouth at bedtime. 02/24/18  Yes Laurey Morale, MD  carvedilol (COREG) 6.25 MG tablet Take 1 tablet (6.25 mg total) by mouth 2 (two) times daily with a meal. 02/24/18   Yes Laurey Morale, MD  losartan (COZAAR) 100 MG tablet Take 1 tablet (100 mg total) by mouth daily. 02/24/18  Yes Laurey Morale, MD  metaxalone (SKELAXIN) 800 MG tablet Take 1 tablet (800 mg total) by mouth 4 (four) times daily. Patient taking differently: Take 800 mg by mouth 2 (two) times daily as needed for muscle spasms.  02/21/17  Yes Laurey Morale, MD  omeprazole (PRILOSEC) 20 MG capsule Take 1 capsule (20 mg total) by mouth daily. 02/24/18  Yes Laurey Morale, MD  sildenafil (VIAGRA) 50 MG tablet Take 1 tablet (50 mg total) by mouth as needed. Patient taking differently: Take 50 mg by mouth as needed for erectile dysfunction.  10/24/17  Yes Laurey Morale, MD  triamcinolone cream (KENALOG) 0.1 % Apply 1 application topically 2 (two) times daily. Patient taking differently: Apply 1 application topically 2 (two) times daily as needed (rash).  02/24/18  Yes Laurey Morale, MD    Physical Exam: Vitals:   04/19/18 2030 04/19/18 2100 04/19/18 2130 04/19/18 2245  BP: (!) 150/89 (!) 168/90 (!) 152/92 (!) 143/84  Pulse: 73 67 68 65  Resp:  14 18 14   Temp:      TempSrc:      SpO2: 96% 97% 98% 96%    Physical Exam  Constitutional: He is oriented to person, place, and time. He appears well-developed and well-nourished. No distress.  HENT:  Head: Normocephalic.  Mouth/Throat: Oropharynx is clear and moist.  Eyes: Pupils are equal, round, and reactive to light. EOM are normal.  Neck: Neck supple.  Cardiovascular: Normal rate, regular rhythm and intact distal pulses.  Pulmonary/Chest: Breath sounds normal. No respiratory distress. He has no wheezes. He has no rales.  Abdominal: Soft. Bowel sounds are normal. He exhibits no distension. There is no abdominal tenderness. There is no guarding.  Musculoskeletal:        General: No edema.  Neurological: He is alert and oriented to person, place, and time. No cranial nerve deficit.  Speech fluent, tongue midline, no facial droop Strength 5 out  of 5 in bilateral upper and lower extremities Sensation to light touch intact throughout  Skin: Skin is warm and dry. He is not diaphoretic.  Psychiatric: He has a normal mood and affect. His behavior is normal.     Labs on Admission: I have personally reviewed following labs and imaging studies  CBC: Recent Labs  Lab 04/19/18 1524  WBC 10.3  NEUTROABS 6.8  HGB 13.8  HCT 41.9  MCV 85.3  PLT 373   Basic Metabolic Panel: Recent Labs  Lab 04/19/18 1524  NA 140  K 4.2  CL 109  CO2 23  GLUCOSE 134*  BUN 15  CREATININE 1.16  CALCIUM 9.1   GFR: CrCl cannot be calculated (Unknown ideal weight.). Liver Function Tests: Recent Labs  Lab 04/19/18 1524  AST 15  ALT 14  ALKPHOS 82  BILITOT 0.5  PROT 6.8  ALBUMIN 3.5   No results for input(s): LIPASE, AMYLASE in the last 168 hours. No results for input(s): AMMONIA in the last 168 hours. Coagulation Profile: Recent Labs  Lab 04/19/18 1524  INR 1.02   Cardiac Enzymes: No results for input(s): CKTOTAL, CKMB, CKMBINDEX, TROPONINI in the last 168 hours. BNP (last 3 results) No results for input(s): PROBNP in the last 8760 hours. HbA1C: No results for input(s): HGBA1C in the last 72 hours. CBG: No results for input(s): GLUCAP in the last 168 hours. Lipid Profile: No results for input(s): CHOL, HDL, LDLCALC, TRIG, CHOLHDL, LDLDIRECT in the last 72 hours. Thyroid Function Tests: No results for input(s): TSH, T4TOTAL, FREET4, T3FREE, THYROIDAB in the last 72 hours. Anemia Panel: No results for input(s): VITAMINB12, FOLATE, FERRITIN, TIBC, IRON, RETICCTPCT in the last 72 hours. Urine analysis:    Component Value Date/Time   COLORURINE yellow 05/04/2009 0838   APPEARANCEUR Clear 05/04/2009 0838   LABSPEC 1.025 05/04/2009 0838   PHURINE 5.0 05/04/2009 0838   HGBUR negative 05/04/2009 0838   BILIRUBINUR neg 02/24/2018 1222   PROTEINUR Negative 02/24/2018 1222   UROBILINOGEN 0.2 02/24/2018 1222   UROBILINOGEN 0.2  05/04/2009 0838   NITRITE neg 02/24/2018 1222   NITRITE negative 05/04/2009 0838   LEUKOCYTESUR Negative 02/24/2018 1222    Radiological Exams on Admission: [name] Head Wo Contrast  Result Date: 04/19/2018 CLINICAL DATA:  Right eye blurred vision. Transient left-sided facial and arm numbness last week. EXAM: MRI HEAD WITHOUT CONTRAST MRA HEAD WITHOUT CONTRAST TECHNIQUE: Multiplanar, multiecho pulse sequences of the brain and surrounding structures were obtained without intravenous contrast. Angiographic images of the head were obtained using MRA technique without contrast. COMPARISON:  None. FINDINGS: MRI HEAD FINDINGS Brain: There are several punctate acute to early subacute cortical infarcts in the right parietal and posterior right frontal lobes. No intracranial hemorrhage, mass, midline shift, or extra-axial fluid collection is identified. Generalized cerebral atrophy is mild for age. Patchy T2 hyperintensities in the cerebral white matter bilaterally are nonspecific but compatible with moderate chronic small vessel ischemic disease. Vascular: Major intracranial vascular flow voids are preserved. Skull and upper cervical spine: Unremarkable bone marrow signal. Sinuses/Orbits: Unremarkable orbits. Minimal left maxillary sinus mucosal thickening. Clear mastoid air cells. Other: None. MRA HEAD FINDINGS There is mild motion artifact. The visualized distal vertebral arteries are patent to the basilar and codominant. Patent PICA and SCA origins are seen bilaterally. The basilar artery is widely patent. There is a small left posterior communicating artery. A right posterior communicating artery is not identified and may be diminutive or absent. Both PCAs are patent without evidence of significant proximal stenosis. The internal carotid arteries are patent from skull base to carotid termini with suspected artifact rather than stenosis near the proximal aspects of both petrous segments. ACAs and MCAs are patent  without evidence of proximal branch occlusion or significant stenosis. No aneurysm is identified. IMPRESSION: 1. Punctate acute to early subacute infarcts in the right parietal and posterior frontal lobes. 2. Moderate chronic small vessel ischemic disease. 3. Negative head MRA. Electronically Signed   By: Logan Bores M.D.   On: 04/19/2018 19:03   Mr Brain Wo Contrast  Result Date: 04/19/2018 CLINICAL DATA:  Right eye blurred vision. Transient left-sided facial and arm numbness last  week. EXAM: MRI HEAD WITHOUT CONTRAST MRA HEAD WITHOUT CONTRAST TECHNIQUE: Multiplanar, multiecho pulse sequences of the brain and surrounding structures were obtained without intravenous contrast. Angiographic images of the head were obtained using MRA technique without contrast. COMPARISON:  None. FINDINGS: MRI HEAD FINDINGS Brain: There are several punctate acute to early subacute cortical infarcts in the right parietal and posterior right frontal lobes. No intracranial hemorrhage, mass, midline shift, or extra-axial fluid collection is identified. Generalized cerebral atrophy is mild for age. Patchy T2 hyperintensities in the cerebral white matter bilaterally are nonspecific but compatible with moderate chronic small vessel ischemic disease. Vascular: Major intracranial vascular flow voids are preserved. Skull and upper cervical spine: Unremarkable bone marrow signal. Sinuses/Orbits: Unremarkable orbits. Minimal left maxillary sinus mucosal thickening. Clear mastoid air cells. Other: None. MRA HEAD FINDINGS There is mild motion artifact. The visualized distal vertebral arteries are patent to the basilar and codominant. Patent PICA and SCA origins are seen bilaterally. The basilar artery is widely patent. There is a small left posterior communicating artery. A right posterior communicating artery is not identified and may be diminutive or absent. Both PCAs are patent without evidence of significant proximal stenosis. The internal  carotid arteries are patent from skull base to carotid termini with suspected artifact rather than stenosis near the proximal aspects of both petrous segments. ACAs and MCAs are patent without evidence of proximal branch occlusion or significant stenosis. No aneurysm is identified. IMPRESSION: 1. Punctate acute to early subacute infarcts in the right parietal and posterior frontal lobes. 2. Moderate chronic small vessel ischemic disease. 3. Negative head MRA. Electronically Signed   By: Logan Bores M.D.   On: 04/19/2018 19:03    EKG: Independently reviewed.  Sinus rhythm, RBBB similar to prior tracing.  Assessment/Plan Principal Problem:   Acute CVA (cerebrovascular accident) [facility]) Active Problems:   Hyperlipidemia   Essential hypertension   Asthma   PAF (paroxysmal atrial fibrillation) (HCC)  Acute ischemic stroke, likely cardioembolic -Patient presented with complains of blurry vision in his right eye with ptosis and left-sided facial and arm numbness.  Symptom onset 3 to 4 days ago.  Numbness has now resolved but he continues to have blurry vision in his right eye. -MRI showing punctate acute to early subacute infarcts in the right parietal and posterior frontal lobes.  Negative head MRA. -Patient was seen by neurology and his presentation is thought to be due to an acute ischemic stroke, likely cardioembolic. -Telemetry monitoring -Allow for permissive hypertension for the first 24-48h - only treat PRN if SBP >220 mmHg. Blood pressures can be gradually normalized to SBP<140 upon discharge. -CT Angiogram of Head and neck due to concern for dissection because of ptosis in the right eye. -Echocardiogram -HgbA1c, fasting lipid panel -Frequent neuro checks -Aspirin - dose 325mg  PO or 300mg  PR -Neurology recommending oral anticoagulation in the long-term given history of paroxysmal atrial fibrillation.   Recommending starting anticoagulation within 3 to 5 days of stroke.  Stroke team to  provide final recommendations. -Atorvastatin 80 mg PO daily -Risk factor modification -PT consult, OT consult, Speech consult  Hypertension -Allow permissive hypertension in the setting of acute CVA  Hyperlipidemia -Change to high intensity statin given stroke  Paroxysmal atrial fibrillation -CHA2DS2VASc 4.  Per chart review, cardiology had previously discussed anticoagulation with the patient but he declined.  I had another lengthy discussion with the patient and his wife today and advised anticoagulation given stroke.  Patient mentioned having an episode of GI bleed 35 years  ago from an ulcer in the setting of taking very large doses of aspirin to treat tennis elbow.  He told me he has not had any further episodes of GI bleed since then.  I have discussed risks versus benefits of anticoagulation.  Patient has expressed his understanding and has decided to start anticoagulation.  Neurology recommending starting anticoagulation within 3 to 5 days of stroke.  Stroke team to provide final recommendations.  Asthma -Stable.  No bronchospasm.  Albuterol nebulizer as needed.  GERD -Continue PPI  DVT prophylaxis: Subcutaneous heparin Code Status: Patient wishes to be full code. Family Communication: Wife at bedside. Disposition Plan: Anticipate discharge in 1 to 2 days. Consults called: Neurology Admission status: Observation, telemetry   Shela Leff MD [facility] Pager [phone]  If 7PM-7AM, please contact night-coverage [email or web address] Password [facility]  04/19/2018, 10:54 PM

## 2018-04-19 NOTE — Telephone Encounter (Signed)
74 yo male with hx of HTN, parox AF.  CHADS2-VASc=2 (age x 1, HTN).  He declined anticoagulation Rx and his AF occurred in the context of an acute illness.  There was not any documented recurrence.  He was last seen by Dr. Percival Spanish 03/2018.  He called the answering service today with transient L facial and L arm tingling last week.  This resolved.  Today, he developed R eye blurred vision. He thinks his R eyelid is drooping.  He has also had some R ear tinnitus. PLAN:  I advised him to go to the ED for evaluation as these symptoms may indicate a stroke.  He agreed to proceed to [facility]. Richardson Dopp, PA-C    04/19/2018 2:31 PM

## 2018-04-19 NOTE — ED Triage Notes (Signed)
Pt reports right eye blurred vision for the past 4 days. Pt also reports right sided facial and arm numbness last week that lasted about 2 hours. The only symptoms the pt is having now is the right eye blurriness. No weakness or other neuro deficits noted. Pt a.o, nad noted

## 2018-04-20 ENCOUNTER — Observation Stay (HOSPITAL_COMMUNITY): Payer: Medicare Other

## 2018-04-20 ENCOUNTER — Observation Stay (HOSPITAL_BASED_OUTPATIENT_CLINIC_OR_DEPARTMENT_OTHER): Payer: Medicare Other

## 2018-04-20 DIAGNOSIS — I10 Essential (primary) hypertension: Secondary | ICD-10-CM | POA: Diagnosis present

## 2018-04-20 DIAGNOSIS — F329 Major depressive disorder, single episode, unspecified: Secondary | ICD-10-CM | POA: Diagnosis present

## 2018-04-20 DIAGNOSIS — E042 Nontoxic multinodular goiter: Secondary | ICD-10-CM | POA: Diagnosis present

## 2018-04-20 DIAGNOSIS — R29700 NIHSS score 0: Secondary | ICD-10-CM | POA: Diagnosis present

## 2018-04-20 DIAGNOSIS — K219 Gastro-esophageal reflux disease without esophagitis: Secondary | ICD-10-CM | POA: Diagnosis present

## 2018-04-20 DIAGNOSIS — Z7982 Long term (current) use of aspirin: Secondary | ICD-10-CM | POA: Diagnosis not present

## 2018-04-20 DIAGNOSIS — Z888 Allergy status to other drugs, medicaments and biological substances status: Secondary | ICD-10-CM | POA: Diagnosis not present

## 2018-04-20 DIAGNOSIS — I639 Cerebral infarction, unspecified: Secondary | ICD-10-CM

## 2018-04-20 DIAGNOSIS — Z8249 Family history of ischemic heart disease and other diseases of the circulatory system: Secondary | ICD-10-CM | POA: Diagnosis not present

## 2018-04-20 DIAGNOSIS — I480 Paroxysmal atrial fibrillation: Secondary | ICD-10-CM | POA: Diagnosis present

## 2018-04-20 DIAGNOSIS — Z79899 Other long term (current) drug therapy: Secondary | ICD-10-CM | POA: Diagnosis not present

## 2018-04-20 DIAGNOSIS — E782 Mixed hyperlipidemia: Secondary | ICD-10-CM | POA: Diagnosis not present

## 2018-04-20 DIAGNOSIS — E785 Hyperlipidemia, unspecified: Secondary | ICD-10-CM | POA: Diagnosis present

## 2018-04-20 DIAGNOSIS — Z886 Allergy status to analgesic agent status: Secondary | ICD-10-CM | POA: Diagnosis not present

## 2018-04-20 DIAGNOSIS — Z825 Family history of asthma and other chronic lower respiratory diseases: Secondary | ICD-10-CM | POA: Diagnosis not present

## 2018-04-20 DIAGNOSIS — Z823 Family history of stroke: Secondary | ICD-10-CM | POA: Diagnosis not present

## 2018-04-20 DIAGNOSIS — Z8711 Personal history of peptic ulcer disease: Secondary | ICD-10-CM | POA: Diagnosis not present

## 2018-04-20 DIAGNOSIS — J45909 Unspecified asthma, uncomplicated: Secondary | ICD-10-CM | POA: Diagnosis present

## 2018-04-20 DIAGNOSIS — H538 Other visual disturbances: Secondary | ICD-10-CM | POA: Diagnosis present

## 2018-04-20 DIAGNOSIS — R918 Other nonspecific abnormal finding of lung field: Secondary | ICD-10-CM | POA: Diagnosis present

## 2018-04-20 DIAGNOSIS — I6523 Occlusion and stenosis of bilateral carotid arteries: Secondary | ICD-10-CM | POA: Diagnosis present

## 2018-04-20 DIAGNOSIS — Z8349 Family history of other endocrine, nutritional and metabolic diseases: Secondary | ICD-10-CM | POA: Diagnosis not present

## 2018-04-20 DIAGNOSIS — Z801 Family history of malignant neoplasm of trachea, bronchus and lung: Secondary | ICD-10-CM | POA: Diagnosis not present

## 2018-04-20 DIAGNOSIS — I6389 Other cerebral infarction: Secondary | ICD-10-CM | POA: Diagnosis present

## 2018-04-20 LAB — LIPID PANEL
Cholesterol: 133 mg/dL (ref 0–200)
HDL: 24 mg/dL — ABNORMAL LOW (ref >40)
LDL Cholesterol: 63 mg/dL (ref 0–99)
Total CHOL/HDL Ratio: 5.5 RATIO
Triglycerides: 230 mg/dL — ABNORMAL HIGH (ref <150)
VLDL: 46 mg/dL — ABNORMAL HIGH (ref 0–40)

## 2018-04-20 LAB — ECHOCARDIOGRAM COMPLETE
HEIGHTINCHES: 67 in
Weight: 2977.09 oz

## 2018-04-20 MED ORDER — APIXABAN 5 MG PO TABS
5.0000 mg | ORAL_TABLET | Freq: Two times a day (BID) | ORAL | Status: DC
  Administered 2018-04-20 – 2018-04-21 (×2): 5 mg via ORAL
  Filled 2018-04-20 (×3): qty 1

## 2018-04-20 NOTE — Progress Notes (Signed)
  Echocardiogram 2D Echocardiogram has been performed.  Johny Chess 04/20/2018, 11:58 AM

## 2018-04-20 NOTE — Progress Notes (Signed)
PT Cancellation Note  Patient Details Name: Luke [name] MRN: [id number] DOB: Jul 20, 1945   Cancelled Treatment:    Reason Eval/Treat Not Completed: PT screened, no needs identified, will sign off   Discussed with OT. Patient with no balance deficits and able to negotiate environment without difficulty even with rt eye blurriness. No PT needs.    [place], [place]  [phone] 04/20/2018, 12:23 PM

## 2018-04-20 NOTE — Evaluation (Signed)
Occupational Therapy Evaluation Patient Details Name: Luke [name] MRN: [id number] DOB: Nov 27, 1945 Today's Date: 04/20/2018    History of Present Illness 73 y.o. male presenting with blurry vision and left sided numbness. PMH including asthma, depression, hypertension, hyperlipidemia, paroxysmal atrial fibrillation not on anticoagulation. MRI showing punctate acute to early subacute infarcts in the right parietal and posterior frontal lobes.  Negative head MRA.    Clinical Impression   PTA, pt was living with his wife and was independent and enjoyed dancing and traveling with his wife. Pt performing at independent level and near baseline function for ADLs and functional mobility. Pt reports that he continues to have blurry vision at right eye and demonstrates good compensatory techniques for vision deficits. Educated pt on compensatory techniques and follow up with eye doctor for visual assessment. Answered all pt questions and will sign off for acute OT. Recommend dc home once medically stable per physician and follow up with eye doctor.     Follow Up Recommendations  No OT follow up;Supervision - Intermittent(Follow up with eye doctor)    Equipment Recommendations  None recommended by OT    Recommendations for Other Services       Precautions / Restrictions Precautions Precautions: None Restrictions Weight Bearing Restrictions: No      Mobility Bed Mobility Overal bed mobility: Independent                Transfers Overall transfer level: Independent                    Balance Overall balance assessment: Independent                                         ADL either performed or assessed with clinical judgement   ADL Overall ADL's : Independent                                       General ADL Comments: Pt performing ADLs at baseline function. Educating pt on follow up with eye MD for vision assessment. Also discussed  wife driving.      Vision Baseline Vision/History: Wears glasses Wears Glasses: Reading only Patient Visual Report: No change from baseline Vision Assessment?: Yes Eye Alignment: Within Functional Limits Ocular Range of Motion: Within Functional Limits Alignment/Gaze Preference: Within Defined Limits Tracking/Visual Pursuits: Able to track stimulus in all quads without difficulty Diplopia Assessment: (None) Depth Perception: [facility]) Additional Comments: Pt denies any diplopia. Left eye dominant. Reporting blury vision through right eye when closing left eye. Able to see details of clock and TV without difficulty; just slightly blurry. Eye movement and tracking [facility]. WFL depth perception     Perception     Praxis      Pertinent Vitals/Pain Pain Assessment: No/denies pain     Hand Dominance Right   Extremity/Trunk Assessment Upper Extremity Assessment Upper Extremity Assessment: Overall WFL for tasks assessed   Lower Extremity Assessment Lower Extremity Assessment: Overall WFL for tasks assessed   Cervical / Trunk Assessment Cervical / Trunk Assessment: Normal   Communication Communication Communication: No difficulties   Cognition Arousal/Alertness: Awake/alert Behavior During Therapy: WFL for tasks assessed/performed Overall Cognitive Status: Within Functional Limits for tasks assessed  General Comments       Exercises     Shoulder Instructions      Home Living Family/patient expects to be discharged to:: Private residence Living Arrangements: Spouse/significant other Available Help at Discharge: Family;Available 24 hours/day Type of Home: House Home Access: Stairs to enter [employer] of Steps: 3   Home Layout: Two level;Bed/bath upstairs Alternate Level Stairs-Number of Steps: Flight   Bathroom Shower/Tub: Occupational psychologist: Standard     Home Equipment: None           Prior Functioning/Environment Level of Independence: Independent        Comments: ADLs, IADLs, driving, and enjoys traveling and [employer]. Goes to the gym three times a week        OT Problem List: Impaired vision/perception;Decreased knowledge of precautions      OT Treatment/Interventions:      OT Goals(Current goals can be found in the care plan section) Acute Rehab OT Goals Patient Stated Goal: Return home OT Goal Formulation: All assessment and education complete, DC therapy  OT Frequency:     Barriers to D/C:            Co-evaluation              AM-PAC OT "6 Clicks" Daily Activity     Outcome Measure Help from another person eating meals?: None Help from another person taking care of personal grooming?: None Help from another person toileting, which includes using toliet, bedpan, or urinal?: None Help from another person bathing (including washing, rinsing, drying)?: None Help from another person to put on and taking off regular upper body clothing?: None Help from another person to put on and taking off regular lower body clothing?: None 6 Click Score: 24   End of Session Nurse Communication: Mobility status  Activity Tolerance: Patient tolerated treatment well Patient left: in chair;with call bell/phone within reach  OT Visit Diagnosis: Low vision, both eyes (H54.2)                Time: 8828-0034 OT Time Calculation (min): 21 min Charges:  OT General Charges $OT Visit: 1 Visit OT Evaluation $OT Eval Moderate Complexity: 1 Mod  Chlora Mcbain MSOT, OTR/L Acute Rehab Pager: [phone] Office: [place] 04/20/2018, 12:08 PM

## 2018-04-20 NOTE — Progress Notes (Signed)
VASCULAR LAB PRELIMINARY  PRELIMINARY  PRELIMINARY  PRELIMINARY  Carotid duplex completed.    Preliminary report:  See CV Proc  Sharion Dove, RVT 04/20/2018, 4:28 PM

## 2018-04-20 NOTE — Progress Notes (Addendum)
Progress Note    Luke [name]  XBJ:[id number] DOB: 09-20-1945  DOA: 04/19/2018 PCP: Laurey Morale, MD    Brief Narrative:     Medical records reviewed and are as summarized below:  Luke [name] is an 73 y.o. male with medical history significant of asthma, depression, hypertension, hyperlipidemia, paroxysmal atrial fibrillation not on anticoagulation presenting to the hospital for evaluation of blurry vision and numbness.  Symptoms started 3 to 4 days ago.  Patient experienced drooping of his right eyelid and blurry vision in that eye.  He also experienced numbness of his left cheek, jaw, and left arm.  States the numbness has now resolved but he continues to have blurry vision in his right eye.  Denies having any headaches.  Denies prior history of stroke.  States he has A. fib and his cardiologist had talked to him about starting a blood thinner but he was hesitant about starting a new medication.  States he had a GI bleed from a stomach ulcer 35 years ago in the setting of taking very large doses of aspirin for tennis elbow.  States he has not had any GI bleeds since then.  Assessment/Plan:   Principal Problem:   Acute CVA (cerebrovascular accident) [facility]) Active Problems:   Hyperlipidemia   Essential hypertension   Asthma   PAF (paroxysmal atrial fibrillation) ([place])  Acute ischemic stroke -Patient presented with complains of blurry vision in his right eye with ptosis and left-sided facial and arm numbness.  Symptom onset 3 to 4 days ago.  Numbness has now resolved but he continues to have blurry vision in his right eye. -MRI showing punctate acute to early subacute infarcts in the right parietal and posterior frontal lobes.  Negative head MRA. -Echocardiogram: The left ventricle has normal systolic function with an ejection fraction of 60-65%. The cavity size was normal. Left ventricular diastolic Doppler parameters are consistent with impaired relaxation No evidence of  left ventricular regional wall  motion abnormalities. -HgbA1c:pending -fasting lipid panel: LDL 63 but triglycerides 230 -U/S carotids: Right Carotid: Velocities in the right ICA are consistent with a 60-79%                stenosis. Left Carotid: Velocities in the left ICA are consistent with a 40-59% stenosis -await final recommendations per neuro regarding the carotids-- outpatient follow up vs inpt consult  Hypertension -Allow permissive hypertension in the setting of acute CVA  Hyperlipidemia -Change to high intensity statin given stroke  Paroxysmal atrial fibrillation -CHA2DS2VASc 4.   -eliquis per neurology  Lung and thyroid nodules -outpatinet follow up  GERD -Continue PPI     Family Communication/Anticipated D/C date and plan/Code Status   DVT prophylaxis: eliquis Code Status: Full Code.  Family Communication: at bedside Disposition Plan: pending neuro recommendations regarding vascular U/S   Medical Consultants:    neuro  Subjective:   Has known a fib and previously had been resistant to a NOAC  Objective:    Vitals:   04/20/18 0304 04/20/18 0502 04/20/18 0900 04/20/18 1251  BP: 136/85 139/79 (!) 150/80 128/75  Pulse: 64 (!) 59 63 67  Resp: 19 18 18 18   Temp: 97.9 F (36.6 C) 97.7 F (36.5 C) 98.5 F (36.9 C) 98.9 F (37.2 C)  TempSrc: Oral Oral  Oral  SpO2: 97% 97% 97% 95%  Weight:      Height:       No intake or output data in the 24 hours ending 04/20/18 1631  Filed Weights   04/20/18 0102  Weight: 84.4 kg    Exam: In bed, NAD rrr No increased work of breathing  A+Ox3  Data Reviewed:   I have personally reviewed following labs and imaging studies:  Labs: Labs show the following:   Basic Metabolic Panel: Recent Labs  Lab 04/19/18 1524  NA 140  K 4.2  CL 109  CO2 23  GLUCOSE 134*  BUN 15  CREATININE 1.16  CALCIUM 9.1   GFR Estimated Creatinine Clearance: 59.8 mL/min (by C-G formula based on SCr of 1.16  mg/dL). Liver Function Tests: Recent Labs  Lab 04/19/18 1524  AST 15  ALT 14  ALKPHOS 82  BILITOT 0.5  PROT 6.8  ALBUMIN 3.5   No results for input(s): LIPASE, AMYLASE in the last 168 hours. No results for input(s): AMMONIA in the last 168 hours. Coagulation profile Recent Labs  Lab 04/19/18 1524  INR 1.02    CBC: Recent Labs  Lab 04/19/18 1524  WBC 10.3  NEUTROABS 6.8  HGB 13.8  HCT 41.9  MCV 85.3  PLT 322   Cardiac Enzymes: No results for input(s): CKTOTAL, CKMB, CKMBINDEX, TROPONINI in the last 168 hours. BNP (last 3 results) No results for input(s): PROBNP in the last 8760 hours. CBG: No results for input(s): GLUCAP in the last 168 hours. D-Dimer: No results for input(s): DDIMER in the last 72 hours. Hgb A1c: No results for input(s): HGBA1C in the last 72 hours. Lipid Profile: Recent Labs    04/20/18 0718  CHOL 133  HDL 24*  LDLCALC 63  TRIG 230*  CHOLHDL 5.5   Thyroid function studies: No results for input(s): TSH, T4TOTAL, T3FREE, THYROIDAB in the last 72 hours.  Invalid input(s): FREET3 Anemia work up: No results for input(s): VITAMINB12, FOLATE, FERRITIN, TIBC, IRON, RETICCTPCT in the last 72 hours. Sepsis Labs: Recent Labs  Lab 04/19/18 1524  WBC 10.3    Microbiology No results found for this or any previous visit (from the past 240 hour(s)).  Procedures and diagnostic studies:  Ct Angio Head W Or Wo Contrast  Result Date: 04/20/2018 CLINICAL DATA:  Follow-up examination for acute stroke. EXAM: CT ANGIOGRAPHY HEAD AND NECK TECHNIQUE: Multidetector CT imaging of the head and neck was performed using the standard protocol during bolus administration of intravenous contrast. Multiplanar CT image reconstructions and MIPs were obtained to evaluate the vascular anatomy. Carotid stenosis measurements (when applicable) are obtained utilizing NASCET criteria, using the distal internal carotid diameter as the denominator. CONTRAST:  <See Chart>  ISOVUE-370 IOPAMIDOL (ISOVUE-370) INJECTION 76% COMPARISON:  Prior CT and MRI from earlier same day. FINDINGS: CTA NECK FINDINGS Aortic arch: Visualized aortic arch of normal caliber with normal branch pattern. Scattered atheromatous plaque within the arch and visualized proximal descending intrathoracic aorta. No flow-limiting stenosis about the origin of the great vessels. Atheromatous plaque at the origin of the right subclavian artery with associated mild approximate 30% stenosis. Visualized subclavian arteries otherwise widely patent. Right carotid system: Right CCA patent from its origin to the bifurcation without stenosis. Mixed plaque at the right bifurcation/proximal right ICA with associated stenosis of up to 60% by NASCET criteria. Right ICA otherwise widely patent distally to the skull base. Left carotid system: Left CCA patent from its origin to the bifurcation without stenosis. Bulky eccentric calcified plaque at the left bifurcation/proximal left ICA with associated stenosis of up to approximately 60% by NASCET criteria as well. Left ICA otherwise widely patent distally to the skull base. Vertebral arteries:  Both of the vertebral arteries arise from the subclavian arteries. Focal plaque at the origin of the right vertebral artery with mild stenosis. Soft plaque at the proximal right V1 segment with associated moderate approximate 50% stenosis. Vertebral arteries otherwise widely patent within the neck. Skeleton: No appreciable acute osseous abnormality. Thoracolumbar scoliosis with multiple segmental anomalies noted. No worrisome osseous lesions. Other neck: No other acute soft tissue abnormality within the neck. Approximate 3.2 cm heterogeneous right thyroid nodule (series 5, image 50). Additional 15 mm left thyroid nodule (series 5, image 45). Upper chest: Visualized upper chest demonstrates no acute finding. Calcified granuloma noted within the anterior right upper lobe. Few scattered ground-glass  nodular densities within the peripheral right upper lobe noted more superiorly, suspected to reflect mucoid impaction and/or minimal small airways disease. 4 mm pleural based right upper lobe nodule noted posteriorly (series 5, image 13). Review of the MIP images confirms the above findings CTA HEAD FINDINGS Anterior circulation: Petrous segments widely patent bilaterally. Cavernous and supraclinoid left ICA widely patent. Minimal atheromatous plaque within the cavernous right ICA without stenosis. Right ICA otherwise unremarkable widely patent to the terminus. A1 segments widely patent. Normal anterior communicating artery. Anterior cerebral arteries widely patent to their distal aspects. No M1 stenosis or occlusion. Distal MCA branches well perfused and symmetric. Posterior circulation: Focal non stenotic plaque within the right V4 segment just prior to the takeoff of the right PICA. Vertebral arteries otherwise unremarkable widely patent vertebrobasilar junction. Posterior inferior cerebral arteries patent bilaterally. Basilar mildly tortuous but widely patent to its distal aspect. Superior cerebral arteries patent bilaterally. Both of the posterior cerebral arteries primarily supplied via the basilar and are widely patent to their distal aspects. Small left posterior communicating artery noted. Venous sinuses: Patent. Anatomic variants: None significant. Delayed phase: No abnormal enhancement. Review of the MIP images confirms the above findings IMPRESSION: 1. Negative CTA for large vessel occlusion. 2. Approximate 60% atheromatous stenoses about the carotid bifurcations/proximal ICAs bilaterally. 3. Mild stenosis at the origin of the left vertebral artery, with more moderate 50% proximal right V1 stenosis. 4. Otherwise wide patency of the major arterial vasculature of the head and neck. 5. 3.2 cm right thyroid and 1.5 cm left thyroid nodules. Follow-up examination with nonemergent dedicated thyroid ultrasound  recommended for further characterization. 6. 4 mm pleural based right upper lobe nodule, indeterminate. No follow-up needed if patient is low-risk. Non-contrast chest CT can be considered in 12 months if patient is high-risk. This recommendation follows the consensus statement: Guidelines for Management of Incidental Pulmonary Nodules Detected on CT Images: From the Fleischner Society 2017; Radiology 2017; 284:228-243. Electronically Signed   By: Jeannine Boga M.D.   On: 04/20/2018 02:07   Ct Angio Neck W Or Wo Contrast  Result Date: 04/20/2018 CLINICAL DATA:  Follow-up examination for acute stroke. EXAM: CT ANGIOGRAPHY HEAD AND NECK TECHNIQUE: Multidetector CT imaging of the head and neck was performed using the standard protocol during bolus administration of intravenous contrast. Multiplanar CT image reconstructions and MIPs were obtained to evaluate the vascular anatomy. Carotid stenosis measurements (when applicable) are obtained utilizing NASCET criteria, using the distal internal carotid diameter as the denominator. CONTRAST:  <See Chart> ISOVUE-370 IOPAMIDOL (ISOVUE-370) INJECTION 76% COMPARISON:  Prior CT and MRI from earlier same day. FINDINGS: CTA NECK FINDINGS Aortic arch: Visualized aortic arch of normal caliber with normal branch pattern. Scattered atheromatous plaque within the arch and visualized proximal descending intrathoracic aorta. No flow-limiting stenosis about the origin of the great  vessels. Atheromatous plaque at the origin of the right subclavian artery with associated mild approximate 30% stenosis. Visualized subclavian arteries otherwise widely patent. Right carotid system: Right CCA patent from its origin to the bifurcation without stenosis. Mixed plaque at the right bifurcation/proximal right ICA with associated stenosis of up to 60% by NASCET criteria. Right ICA otherwise widely patent distally to the skull base. Left carotid system: Left CCA patent from its origin to the  bifurcation without stenosis. Bulky eccentric calcified plaque at the left bifurcation/proximal left ICA with associated stenosis of up to approximately 60% by NASCET criteria as well. Left ICA otherwise widely patent distally to the skull base. Vertebral arteries: Both of the vertebral arteries arise from the subclavian arteries. Focal plaque at the origin of the right vertebral artery with mild stenosis. Soft plaque at the proximal right V1 segment with associated moderate approximate 50% stenosis. Vertebral arteries otherwise widely patent within the neck. Skeleton: No appreciable acute osseous abnormality. Thoracolumbar scoliosis with multiple segmental anomalies noted. No worrisome osseous lesions. Other neck: No other acute soft tissue abnormality within the neck. Approximate 3.2 cm heterogeneous right thyroid nodule (series 5, image 50). Additional 15 mm left thyroid nodule (series 5, image 45). Upper chest: Visualized upper chest demonstrates no acute finding. Calcified granuloma noted within the anterior right upper lobe. Few scattered ground-glass nodular densities within the peripheral right upper lobe noted more superiorly, suspected to reflect mucoid impaction and/or minimal small airways disease. 4 mm pleural based right upper lobe nodule noted posteriorly (series 5, image 13). Review of the MIP images confirms the above findings CTA HEAD FINDINGS Anterior circulation: Petrous segments widely patent bilaterally. Cavernous and supraclinoid left ICA widely patent. Minimal atheromatous plaque within the cavernous right ICA without stenosis. Right ICA otherwise unremarkable widely patent to the terminus. A1 segments widely patent. Normal anterior communicating artery. Anterior cerebral arteries widely patent to their distal aspects. No M1 stenosis or occlusion. Distal MCA branches well perfused and symmetric. Posterior circulation: Focal non stenotic plaque within the right V4 segment just prior to the  takeoff of the right PICA. Vertebral arteries otherwise unremarkable widely patent vertebrobasilar junction. Posterior inferior cerebral arteries patent bilaterally. Basilar mildly tortuous but widely patent to its distal aspect. Superior cerebral arteries patent bilaterally. Both of the posterior cerebral arteries primarily supplied via the basilar and are widely patent to their distal aspects. Small left posterior communicating artery noted. Venous sinuses: Patent. Anatomic variants: None significant. Delayed phase: No abnormal enhancement. Review of the MIP images confirms the above findings IMPRESSION: 1. Negative CTA for large vessel occlusion. 2. Approximate 60% atheromatous stenoses about the carotid bifurcations/proximal ICAs bilaterally. 3. Mild stenosis at the origin of the left vertebral artery, with more moderate 50% proximal right V1 stenosis. 4. Otherwise wide patency of the major arterial vasculature of the head and neck. 5. 3.2 cm right thyroid and 1.5 cm left thyroid nodules. Follow-up examination with nonemergent dedicated thyroid ultrasound recommended for further characterization. 6. 4 mm pleural based right upper lobe nodule, indeterminate. No follow-up needed if patient is low-risk. Non-contrast chest CT can be considered in 12 months if patient is high-risk. This recommendation follows the consensus statement: Guidelines for Management of Incidental Pulmonary Nodules Detected on CT Images: From the Fleischner Society 2017; Radiology 2017; 284:228-243. Electronically Signed   By: Jeannine Boga M.D.   On: 04/20/2018 02:07   [name] Head Wo Contrast  Result Date: 04/19/2018 CLINICAL DATA:  Right eye blurred vision. Transient left-sided facial and  arm numbness last week. EXAM: MRI HEAD WITHOUT CONTRAST MRA HEAD WITHOUT CONTRAST TECHNIQUE: Multiplanar, multiecho pulse sequences of the brain and surrounding structures were obtained without intravenous contrast. Angiographic images of the  head were obtained using MRA technique without contrast. COMPARISON:  None. FINDINGS: MRI HEAD FINDINGS Brain: There are several punctate acute to early subacute cortical infarcts in the right parietal and posterior right frontal lobes. No intracranial hemorrhage, mass, midline shift, or extra-axial fluid collection is identified. Generalized cerebral atrophy is mild for age. Patchy T2 hyperintensities in the cerebral white matter bilaterally are nonspecific but compatible with moderate chronic small vessel ischemic disease. Vascular: Major intracranial vascular flow voids are preserved. Skull and upper cervical spine: Unremarkable bone marrow signal. Sinuses/Orbits: Unremarkable orbits. Minimal left maxillary sinus mucosal thickening. Clear mastoid air cells. Other: None. MRA HEAD FINDINGS There is mild motion artifact. The visualized distal vertebral arteries are patent to the basilar and codominant. Patent PICA and SCA origins are seen bilaterally. The basilar artery is widely patent. There is a small left posterior communicating artery. A right posterior communicating artery is not identified and may be diminutive or absent. Both PCAs are patent without evidence of significant proximal stenosis. The internal carotid arteries are patent from skull base to carotid termini with suspected artifact rather than stenosis near the proximal aspects of both petrous segments. ACAs and MCAs are patent without evidence of proximal branch occlusion or significant stenosis. No aneurysm is identified. IMPRESSION: 1. Punctate acute to early subacute infarcts in the right parietal and posterior frontal lobes. 2. Moderate chronic small vessel ischemic disease. 3. Negative head MRA. Electronically Signed   By: Logan Bores M.D.   On: 04/19/2018 19:03   Mr Brain Wo Contrast  Result Date: 04/19/2018 CLINICAL DATA:  Right eye blurred vision. Transient left-sided facial and arm numbness last week. EXAM: MRI HEAD WITHOUT CONTRAST  MRA HEAD WITHOUT CONTRAST TECHNIQUE: Multiplanar, multiecho pulse sequences of the brain and surrounding structures were obtained without intravenous contrast. Angiographic images of the head were obtained using MRA technique without contrast. COMPARISON:  None. FINDINGS: MRI HEAD FINDINGS Brain: There are several punctate acute to early subacute cortical infarcts in the right parietal and posterior right frontal lobes. No intracranial hemorrhage, mass, midline shift, or extra-axial fluid collection is identified. Generalized cerebral atrophy is mild for age. Patchy T2 hyperintensities in the cerebral white matter bilaterally are nonspecific but compatible with moderate chronic small vessel ischemic disease. Vascular: Major intracranial vascular flow voids are preserved. Skull and upper cervical spine: Unremarkable bone marrow signal. Sinuses/Orbits: Unremarkable orbits. Minimal left maxillary sinus mucosal thickening. Clear mastoid air cells. Other: None. MRA HEAD FINDINGS There is mild motion artifact. The visualized distal vertebral arteries are patent to the basilar and codominant. Patent PICA and SCA origins are seen bilaterally. The basilar artery is widely patent. There is a small left posterior communicating artery. A right posterior communicating artery is not identified and may be diminutive or absent. Both PCAs are patent without evidence of significant proximal stenosis. The internal carotid arteries are patent from skull base to carotid termini with suspected artifact rather than stenosis near the proximal aspects of both petrous segments. ACAs and MCAs are patent without evidence of proximal branch occlusion or significant stenosis. No aneurysm is identified. IMPRESSION: 1. Punctate acute to early subacute infarcts in the right parietal and posterior frontal lobes. 2. Moderate chronic small vessel ischemic disease. 3. Negative head MRA. Electronically Signed   By: Seymour Bars.D.  On: 04/19/2018  19:03   Vas US Carotid  Result Date: 04/20/2018 Carotid Arterial Duplex Study Indications:       CVA, Numbness and Blurry vision, CTA of neck shows Bilateral                    60% ICA stenosis at bifurcation. Risk Factors:      Hypertension, hyperlipidemia. Other Factors:     Paroxysmal atrial fibrillation. Comparison Study:  No prior study on file Performing Technologist: Sharion Dove RVS  Examination Guidelines: A complete evaluation includes B-mode imaging, spectral Doppler, color Doppler, and power Doppler as needed of all accessible portions of each vessel. Bilateral testing is considered an integral part of a complete examination. Limited examinations for reoccurring indications may be performed as noted.  Right Carotid Findings: +----------+--------+--------+--------+---------------------+------------------+           PSV cm/sEDV cm/sStenosisDescribe             Comments           +----------+--------+--------+--------+---------------------+------------------+ CCA Prox  91      20                                   intimal thickening +----------+--------+--------+--------+---------------------+------------------+ CCA Distal81      20                                   intimal thickening +----------+--------+--------+--------+---------------------+------------------+ ICA Prox  243     63      60-79%  calcific and         Shadowing                                            irregular                               +----------+--------+--------+--------+---------------------+------------------+ ICA Mid   108     24                                                      +----------+--------+--------+--------+---------------------+------------------+ ICA Distal78      29                                                      +----------+--------+--------+--------+---------------------+------------------+ ECA       104     19                                                       +----------+--------+--------+--------+---------------------+------------------+ +----------+--------+-------+--------+-------------------+           PSV cm/sEDV cmsDescribeArm Pressure (mmHG) +----------+--------+-------+--------+-------------------+ [id number]                                        +----------+--------+-------+--------+-------------------+ +---------+--------+--+--------+--+  VertebralPSV cm/s64EDV cm/s23 +---------+--------+--+--------+--+  Left Carotid Findings: +----------+--------+--------+--------+---------------------+------------------+           PSV cm/sEDV cm/sStenosisDescribe             Comments           +----------+--------+--------+--------+---------------------+------------------+ CCA Prox  76      15                                   intimal thickening +----------+--------+--------+--------+---------------------+------------------+ CCA Distal66      18                                   intimal thickening +----------+--------+--------+--------+---------------------+------------------+ ICA Prox  163     48      40-59%  calcific and         Shadowing                                            irregular                               +----------+--------+--------+--------+---------------------+------------------+ ICA Mid   106     24                                                      +----------+--------+--------+--------+---------------------+------------------+ ICA Distal85      27                                                      +----------+--------+--------+--------+---------------------+------------------+ ECA       114     22                                                      +----------+--------+--------+--------+---------------------+------------------+ +----------+--------+--------+--------+-------------------+ SubclavianPSV cm/sEDV cm/sDescribeArm Pressure (mmHG)  +----------+--------+--------+--------+-------------------+           158                                         +----------+--------+--------+--------+-------------------+ +---------+--------+--+--------+--+ VertebralPSV cm/s50EDV cm/s12 +---------+--------+--+--------+--+  Summary: Right Carotid: Velocities in the right ICA are consistent with a 60-79%                stenosis. Left Carotid: Velocities in the left ICA are consistent with a 40-59% stenosis. Vertebrals:  Bilateral vertebral arteries demonstrate antegrade flow. Subclavians: Normal flow hemodynamics were seen in bilateral subclavian              arteries. *See table(s) above for measurements and observations.     Preliminary     Medications:   .  stroke: mapping our early stages of recovery  book   Does not apply Once  . acyclovir  200 mg Oral Daily  . apixaban  5 mg Oral BID  . atorvastatin  80 mg Oral q1800  . pantoprazole  40 mg Oral Daily  . sodium chloride flush  3 mL Intravenous Once   Continuous Infusions:   LOS: 0 days   Luke [name]  [facility]   How to contact the [facility] Attending or Consulting provider [place] or covering provider during after hours [place], for this patient?  1. Check the care team in [facility] and look for a) attending/consulting TRH provider listed and b) the [facility] team listed 2. Log into [email or web address] and use [place]'s universal password to access. If you do not have the password, please contact the hospital operator. 3. Locate the [facility] provider you are looking for under [facility] and page to a number that you can be directly reached. 4. If you still have difficulty reaching the provider, please page the [facility] (Director on Call) for the Hospitalists listed on amion for assistance.  04/20/2018, 4:31 PM

## 2018-04-20 NOTE — Progress Notes (Signed)
ANTICOAGULATION CONSULT NOTE - Initial Consult  Pharmacy Consult for apixaban Indication: atrial fibrillation/CVA  Allergies  Allergen Reactions  . Aspirin Other (See Comments)    REACTION: ULCER @LARGE  DOSES  . Hydrochlorothiazide Other (See Comments)    Leg cramps  . Irbesartan Other (See Comments)    Headaches   . Symbicort [Budesonide-Formoterol Fumarate] Other (See Comments)    legcramps    Patient Measurements: Height: 5\' 7"  (170.2 cm) Weight: 186 lb 1.1 oz (84.4 kg) IBW/kg (Calculated) : 66.1 Heparin Dosing Weight:   Vital Signs: Temp: 98.9 F (37.2 C) (02/16 1251) Temp Source: Oral (02/16 1251) BP: 128/75 (02/16 1251) Pulse Rate: 67 (02/16 1251)  Labs: Recent Labs    04/19/18 1524  HGB 13.8  HCT 41.9  PLT 322  APTT 32  LABPROT 13.3  INR 1.02  CREATININE 1.16    Estimated Creatinine Clearance: 59.8 mL/min (by C-G formula based on SCr of 1.16 mg/dL).   Medical History: Past Medical History:  Diagnosis Date  . Asthma   . Depression   . ED (erectile dysfunction)   . Hyperlipidemia   . Hypertension   . Paroxysmal atrial fibrillation ([place]) 09/2016   Initial diagnosis was in setting of acute URI/asthma attack  . Solitary pulmonary nodule 04/16/2013   03/30/13  CXR Question left nipple shadow.  10 mm ovoid nodular density right upper lobe, cannot exclude  pulmonary mass/ nodule > f/u cxr 04/29/2013 > no nodule    . Ulcer     Medications:  Medications Prior to Admission  Medication Sig Dispense Refill Last Dose  . acyclovir (ZOVIRAX) 200 MG capsule TAKE 1 CAPSULE DAILY (Patient taking differently: Take 200 mg by mouth daily. ) 90 capsule 3 04/19/2018 at Unknown time  . albuterol (PROVENTIL HFA;VENTOLIN HFA) 108 (90 Base) MCG/ACT inhaler Inhale 2 puffs into the lungs every 6 (six) hours as needed for wheezing or shortness of breath.   UNK  . amLODipine (NORVASC) 10 MG tablet Take 1 tablet (10 mg total) by mouth daily. 90 tablet 3 04/19/2018 at Unknown time   . aspirin EC 81 MG tablet Take 1 tablet (81 mg total) by mouth daily. 90 tablet 3 04/18/2018 at Unknown time  . atorvastatin (LIPITOR) 10 MG tablet Take 1 tablet (10 mg total) by mouth at bedtime. 90 tablet 3 04/18/2018 at Unknown time  . carvedilol (COREG) 6.25 MG tablet Take 1 tablet (6.25 mg total) by mouth 2 (two) times daily with a meal. 180 tablet 3 04/19/2018 at 0930  . losartan (COZAAR) 100 MG tablet Take 1 tablet (100 mg total) by mouth daily. 90 tablet 3 04/19/2018 at Unknown time  . metaxalone (SKELAXIN) 800 MG tablet Take 1 tablet (800 mg total) by mouth 4 (four) times daily. (Patient taking differently: Take 800 mg by mouth 2 (two) times daily as needed for muscle spasms. ) 60 tablet 2 UNK  . omeprazole (PRILOSEC) 20 MG capsule Take 1 capsule (20 mg total) by mouth daily. 90 capsule 3 04/19/2018 at Unknown time  . sildenafil (VIAGRA) 50 MG tablet Take 1 tablet (50 mg total) by mouth as needed. (Patient taking differently: Take 50 mg by mouth as needed for erectile dysfunction. ) 10 tablet 11 Past Month at Unknown time  . triamcinolone cream (KENALOG) 0.1 % Apply 1 application topically 2 (two) times daily. (Patient taking differently: Apply 1 application topically 2 (two) times daily as needed (rash). ) 45 g 2 Past Week at Unknown time   Scheduled:  .  stroke: mapping our early stages of recovery book   Does not apply Once  . acyclovir  200 mg Oral Daily  . apixaban  5 mg Oral BID  . atorvastatin  80 mg Oral q1800  . pantoprazole  40 mg Oral Daily  . sodium chloride flush  3 mL Intravenous Once    Assessment: Pt with a hx of afib but not on anticoagulation. Presented with CVA. Plan is to use apixaban for anticoagulation.   Age<80, wt>60, scr<1.5  Goal of Therapy:   Monitor platelets by anticoagulation protocol: Yes   Plan:  Apixaban 5mg  PO BID Will teach Monitor for bleeding  Onnie Boer, PharmD, BCIDP, AAHIVP, CPP Infectious Disease Pharmacist 04/20/2018 2:01 PM

## 2018-04-20 NOTE — Care Management Obs Status (Signed)
[facility] NOTIFICATION   Patient Details  Name: DAMARIS GEERS MRN: [id number] Date of Birth: [birth year]   Medicare Observation Status Notification Given:  Yes    Carles Collet, RN 04/20/2018, 12:51 PM

## 2018-04-20 NOTE — Progress Notes (Signed)
SLP Cancellation Note  Patient Details Name: Luke [name] MRN: [id number] DOB: [birth year]   Cancelled treatment:       Reason Eval/Treat Not Completed: SLP screened, no needs identified, will sign off. Pt at baseline level of function.   Deneise Lever, [place], CCC-SLP Speech-Language Pathologist Acute Rehabilitation Services Pager: [phone] Office: [phone]    Aliene Altes 04/20/2018, 12:23 PM

## 2018-04-20 NOTE — Progress Notes (Signed)
STROKE TEAM PROGRESS NOTE   HISTORY OF PRESENT ILLNESS (per record) Luke [name] is a 73 y.o. male past medical history of paroxysmal atrial fibrillation not on anticoagulation, hypertension, and hyperlipidemia who presented to the emergency room for 3 to 4 days worth of blurred vision through his left eye. He said that he started noticing symptoms all of a sudden on Thursday and did not make much of it.  His wife brought him into the emergency room because the symptoms were persistent. He denies any focal tingling numbness weakness.  He reports some right eye heaviness and may be drooping eyelids. Denies any chest pain nausea vomiting shortness of breath. Has not had any symptoms of stroke or strokes in the past. He was told that he has atrial fibrillation that comes and goes and anticoagulation was discussed but he chose not to be on anticoagulation because of the side effects.  He is a retired [employer].  Also veteran of the [employer].  In ER, MRI brain was done that showed scattered punctate embolic-looking infarcts in the right cerebral hemisphere.  No bleed.  LKW: At least 3 days ago tpa given?: no, outside the window Premorbid modified Rankin scale (mRS): 0   SUBJECTIVE (INTERVAL HISTORY) Pt's wife at the bedside. Dr Leonie Man discussed the patients case and answered questions.     OBJECTIVE Vitals:   04/20/18 0304 04/20/18 0502 04/20/18 0900 04/20/18 1251  BP: 136/85 139/79 (!) 150/80 128/75  Pulse: 64 (!) 59 63 67  Resp: 19 18 18 18   Temp: 97.9 F (36.6 C) 97.7 F (36.5 C) 98.5 F (36.9 C) 98.9 F (37.2 C)  TempSrc: Oral Oral  Oral  SpO2: 97% 97% 97% 95%  Weight:      Height:        CBC:  Recent Labs  Lab 04/19/18 1524  WBC 10.3  NEUTROABS 6.8  HGB 13.8  HCT 41.9  MCV 85.3  PLT 539    Basic Metabolic Panel:  Recent Labs  Lab 04/19/18 1524  NA 140  K 4.2  CL 109  CO2 23  GLUCOSE 134*  BUN 15  CREATININE 1.16  CALCIUM 9.1     Lipid Panel:     Component Value Date/Time   CHOL 133 04/20/2018 0718   TRIG 230 (H) 04/20/2018 0718   HDL 24 (L) 04/20/2018 0718   CHOLHDL 5.5 04/20/2018 0718   VLDL 46 (H) 04/20/2018 0718   LDLCALC 63 04/20/2018 0718   HgbA1c:  Lab Results  Component Value Date   HGBA1C 6.3 02/24/2018   Urine Drug Screen:     Component Value Date/Time   LABOPIA NONE DETECTED 09/25/2016 1927   COCAINSCRNUR NONE DETECTED 09/25/2016 1927   LABBENZ NONE DETECTED 09/25/2016 1927   AMPHETMU NONE DETECTED 09/25/2016 1927   THCU NONE DETECTED 09/25/2016 1927   LABBARB NONE DETECTED 09/25/2016 1927    Alcohol Level No results found for: ETH  IMAGING  Ct Angio Head W Or Wo Contrast Ct Angio Neck W Or Wo Contrast 04/20/2018 IMPRESSION:  1. Negative CTA for large vessel occlusion.  2. Approximate 60% atheromatous stenoses about the carotid bifurcations/proximal ICAs bilaterally.  3. Mild stenosis at the origin of the left vertebral artery, with more moderate 50% proximal right V1 stenosis.  4. Otherwise wide patency of the major arterial vasculature of the head and neck.  5. 3.2 cm right thyroid and 1.5 cm left thyroid nodules. Follow-up examination with nonemergent dedicated thyroid ultrasound recommended for further  characterization.  6. 4 mm pleural based right upper lobe nodule, indeterminate. No follow-up needed if patient is low-risk. Non-contrast chest CT can be considered in 12 months if patient is high-risk. This recommendation follows the consensus statement: Guidelines for Management of Incidental Pulmonary Nodules Detected on CT Images: From the Fleischner Society 2017; Radiology 2017; 284:228-243.    [name] Head Wo Contrast  04/19/2018 IMPRESSION:  1. Punctate acute to early subacute infarcts in the right parietal and posterior frontal lobes.  2. Moderate chronic small vessel ischemic disease.  3. Negative head MRA.    Transthoracic Echocardiogram  04/20/2018 IMPRESSIONS    1. The left ventricle has normal systolic function with an ejection fraction of 60-65%. The cavity size was normal. Left ventricular diastolic Doppler parameters are consistent with impaired relaxation No evidence of left ventricular regional wall  motion abnormalities.  2. The right ventricle has normal systolic function. The cavity was normal. There is no increase in right ventricular wall thickness.  3. The mitral valve is normal in structure.  4. The tricuspid valve is normal in structure.  5. The aortic valve is tricuspid Moderate thickening of the aortic valve Moderate calcification of the aortic valve.  6. The pulmonic valve was normal in structure.  7. No evidence of left ventricular regional wall motion abnormalities.  8. Right atrial pressure is estimated at 3 mmHg.   Bilateral Carotid Dopplers  04/10/2018 Summary: Right Carotid: Velocities in the right ICA are consistent with a 60-79% stenosis. Left Carotid: Velocities in the left ICA are consistent with a 40-59% stenosis. Vertebrals:  Bilateral vertebral arteries demonstrate antegrade flow. Subclavians: Normal flow hemodynamics were seen in bilateral subclavian arteries.    PHYSICAL EXAM Blood pressure 128/75, pulse 67, temperature 98.9 F (37.2 C), temperature source Oral, resp. rate 18, height 5\' 7"  (1.702 m), weight 84.4 kg, SpO2 95 %. Pleasant e;lderly Caucasian male not in distress. . Afebrile. Head is nontraumatic. Neck is supple without bruit.    Cardiac exam no murmur or gallop. Lungs are clear to auscultation. Distal pulses are well felt. Neurological Exam :  Awake  Alert oriented x 3. Normal speech and language.eye movements full without nystagmus.fundi were not visualized. Vision acuity and fields appear normal. Despite subjective blurred vision in left eye Hearing is normal. Palatal movements are normal. Face symmetric. Tongue midline. Normal strength, tone, reflexes and coordination. Normal sensation. Gait  deferred.    ASSESSMENT/PLAN [name] is a 73 y.o. male with history of paroxysmal atrial fibrillation not on anticoagulation, hypertension, and hyperlipidemia presenting with blurred vision through his left eye.  He did not receive IV t-PA due to late presentation.   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   MRA head - negative  CTA H&N - Approximate 60% atheromatous stenoses about the carotid bifurcations/proximal ICAs bilaterally.   Carotid Doppler - Right Carotid: consistent with a 60-79% stenosis.  2D Echo  - EF 60 - 65%. No cardiac source of emboli identified.   LDL - 63  HgbA1c - pending  UDS - not performed  VTE prophylaxis - Eliquis  Diet  - Heart healthy with thin liquids.  aspirin 81 mg daily prior to admission, now on Eliquis (apixaban) daily  Patient counseled to be compliant with  his antithrombotic medications  Ongoing aggressive stroke risk factor management  Therapy recommendations:  pending  Disposition:  Pending  Hypertension  Stable . Permissive hypertension (OK if < 220/120) but gradually normalize in 5-7 days . Long-term BP goal normotensive  Hyperlipidemia  Lipid lowering medication PTA:  Lipitor 10 mg daily  LDL 63, goal < 70  Current lipid lowering medication: Lipitor 80 mg daily  Continue statin at discharge  Other Stroke Risk Factors  Advanced age  ETOH use, advised to drink no more than 1 alcoholic beverage per day.  Obesity, Body mass index is 29.14 kg/m., recommend weight loss, diet and exercise as appropriate   Family hx stroke (mother)  PAF not anticoagulated   Other Active Problems  4 mm pleural based right upper lobe nodule, indeterminate. No follow-up needed if patient  is low-risk. Non-contrast chest CT can be considered in 12 months if patient is high-risk. (Hx of left pulmonary nodule 2015) ( His brother had lung cancer but he was a smoker)  3.2 cm right thyroid and 1.5 cm left thyroid nodules. Follow-up examination with nonemergent dedicated thyroid ultrasound recommended for further characterization.   Approximate 60% atheromatous stenoses about the carotid bifurcations/proximal ICAs bilaterally. Carotid Doppler - Right Carotid: consistent with a 60-79% stenosis. Will need vascular surgery consult.   Hospital day # 0  Mikey Bussing PA-C [facility] Pager [phone] 04/20/2018, 6:21 PM I have personally obtained history,examined this patient, reviewed notes, independently viewed imaging studies, participated in medical decision making and plan of care.ROS completed by me personally and pertinent positives fully documented  I have made any additions or clarifications directly to the above note. Agree with note above.  He presented with subjective blurred vision in the left eye and CT angiogram showed 60% stenosis bilaterally but carotid ultrasound shows only 40 to 59% stenosis on the left.  However he has multiple small embolic infarcts in the right brain and has 60% right carotid stenosis confirmed both on carotid ultrasound and CT angiogram and may benefit with elective right carotid revascularization and vascular surgery consult.  He has history of atrial fibrillation in the past and will need long-term anticoagulation with Eliquis for secondary stroke prevention.  Have discussed risk benefit of Eliquis with the patient and answered questions.  Discussed with Dr. Eliseo Squires  Greater than 50% time during this 35-minute visit was spent on counseling and coordination of care about stroke risk from atrial fibrillation as well as symptomatic carotid stenosis and discussion about evaluation and treatment plan and answering questions.  Antony Contras,  MD Medical Director [facility] Pager: [phone] 04/20/2018 8:08 PM  To contact Stroke Continuity provider, please refer to [email or web address] After hours, contact General Neurology

## 2018-04-20 NOTE — Progress Notes (Signed)
1500 Bedside report given to Jana Half, [place]. Pt sitting up in bed, NAD, no complaints. Awaiting transport for carotid US. Updated with POC.

## 2018-04-20 NOTE — Discharge Instructions (Signed)

## 2018-04-21 LAB — HEMOGLOBIN A1C
Hgb A1c MFr Bld: 6.1 % — ABNORMAL HIGH (ref 4.8–5.6)
Mean Plasma Glucose: 128 mg/dL

## 2018-04-21 MED ORDER — APIXABAN 5 MG PO TABS: 5.0000 mg | ORAL_TABLET | Freq: Two times a day (BID) | ORAL | 0 refills | Status: DC

## 2018-04-21 MED ORDER — APIXABAN 5 MG PO TABS: 5.0000 mg | ORAL_TABLET | Freq: Two times a day (BID) | ORAL | Status: DC

## 2018-04-21 NOTE — Care Management (Signed)
#  2.   S/W RHIANNON    @  [facility] # [phone] OPT- MEMBER   ELIQUIS  5 MG BID COVER - YES CO-PAY- $ 200.00 TIER- 2 DRUG PRIOR APPROVAL- NO  NO DEDUCTIBLE OUT-OF-POCKET: NOT MET  PREFERRED PHARMACY : YES [facility]   AND  [facility]

## 2018-04-21 NOTE — Plan of Care (Signed)
Min assist with adls 

## 2018-04-21 NOTE — Discharge Summary (Signed)
Physician Discharge Summary  Luke [name] FWY:[id number] DOB: 10/03/1945 DOA: 04/19/2018  PCP: Laurey Morale, MD  Admit date: 04/19/2018 Discharge date: 04/21/2018  Admitted From: Home Disposition: Home  Recommendations for Outpatient Follow-up:  1. Follow up with PCP in 1-2 weeks 2. Please obtain BMP/CBC in one week your next doctors visit.  3. Started on Eliquis twice daily 4. Follow-up outpatient neurology in 3-4 weeks  Home Health: None Equipment/Devices: None Discharge Condition: Stable CODE STATUS: Full code Diet recommendation: Heart healthy  Brief/Interim Summary: 73 year old with history of asthma, essential hypertension, hyperlipidemia, paroxysmal atrial fibrillation not on anticoagulation came to the hospital with complains of blurry vision and numbness.  Work-up in the hospital including MRI of the brain showed acute to early subacute infarct in the right parietal and posterior frontal lobes.  CTA of the head and neck showed 60% atherosclerotic stenosis bilaterally.  Carotid ultrasound showed 60 to 70% right carotid stenosis.  Echocardiogram ejection fraction 60 to 65%.  LDL was 63.  Patient was on aspirin prior to the admission and was switched over to Eliquis at the time of discharge. During hospitalization CAT scan he was found to have a 4 mm pleural-based right upper lobe nodule and he was recommended to follow-up outpatient with his primary care provider and get repeat scan in about 12 months.  He was also found to 3.5 cm right thyroid and 1.5 cm left thyroid nodule and was advised to get outpatient thyroid ultrasound. Given abnormal carotid ultrasound, he should follow-up outpatient with vascular as well.  Rest of the recommendations as stated above.  Stable for discharge today.   Discharge Diagnoses:  Principal Problem:   Acute CVA (cerebrovascular accident) [facility]) Active Problems:   Hyperlipidemia   Essential hypertension   Asthma   PAF (paroxysmal atrial  fibrillation) ([place])  Blurry vision in the left eye secondary to acute/subacute right parietal/posterior frontal lobe CVA - MRI of the brain is positive for stroke.  MRI of the head was negative.  CTA of the head and neck showed about 60% bilateral carotid stenosis.  Carotid Doppler showed 60 to 79% right carotid stenosis.  Advised to follow-up outpatient with vascular surgery -Aspirin changed to Eliquis twice daily.  Follow-up outpatient with neurology in about 3-4 weeks -LDL level 63, continue Lipitor.  Essential hypertension -Slowly resume home blood pressure medications  Hyperlipidemia -Continue statin  Paroxysmal atrial fibrillation -Rate control, now on Eliquis  Thyroid L lung nodule -As described above, follow-up outpatient  GERD -PPI  Eliquis for DVT prophylaxis while patient was here Full code Discharge today.  Discharge Instructions   Allergies as of 04/21/2018      Reactions   Aspirin Other (See Comments)   REACTION: ULCER @LARGE  DOSES   Hydrochlorothiazide Other (See Comments)   Leg cramps   Irbesartan Other (See Comments)   Headaches   Symbicort [budesonide-formoterol Fumarate] Other (See Comments)   legcramps      Medication List    STOP taking these medications   aspirin EC 81 MG tablet     TAKE these medications   acyclovir 200 MG capsule Commonly known as:  ZOVIRAX TAKE 1 CAPSULE DAILY What changed:    how much to take  how to take this  when to take this  additional instructions   albuterol 108 (90 Base) MCG/ACT inhaler Commonly known as:  PROVENTIL HFA;VENTOLIN HFA Inhale 2 puffs into the lungs every 6 (six) hours as needed for wheezing or shortness of breath.   amLODipine  10 MG tablet Commonly known as:  NORVASC Take 1 tablet (10 mg total) by mouth daily.   apixaban 5 MG Tabs tablet Commonly known as:  ELIQUIS Take 1 tablet (5 mg total) by mouth 2 (two) times daily for 30 days.   atorvastatin 10 MG tablet Commonly known as:   LIPITOR Take 1 tablet (10 mg total) by mouth at bedtime.   carvedilol 6.25 MG tablet Commonly known as:  COREG Take 1 tablet (6.25 mg total) by mouth 2 (two) times daily with a meal.   losartan 100 MG tablet Commonly known as:  COZAAR Take 1 tablet (100 mg total) by mouth daily.   metaxalone 800 MG tablet Commonly known as:  SKELAXIN Take 1 tablet (800 mg total) by mouth 4 (four) times daily. What changed:    when to take this  reasons to take this   omeprazole 20 MG capsule Commonly known as:  PRILOSEC Take 1 capsule (20 mg total) by mouth daily.   sildenafil 50 MG tablet Commonly known as:  VIAGRA Take 1 tablet (50 mg total) by mouth as needed. What changed:  reasons to take this   triamcinolone cream 0.1 % Commonly known as:  KENALOG Apply 1 application topically 2 (two) times daily. What changed:    when to take this  reasons to take this      Follow-up Information    Laurey Morale, MD. Schedule an appointment as soon as possible for a visit in 1 week(s).   Specialty:  Family Medicine Contact information: [place] [address] [phone]        Minus Breeding, MD .   Specialty:  Cardiology Contact information: [address] [place] [address] [phone]        Garvin Fila, MD Follow up in 4 week(s).   Specialties:  Neurology, Radiology Why:  Post CVA Contact information: [place]  [address] [phone]        [place]. Schedule an appointment as soon as possible for a visit in 4 week(s).   Why:  Acute CVA.  + carotid artery disease.  Contact information: [place] [phone]         Allergies  Allergen Reactions  . Aspirin Other (See Comments)    REACTION: ULCER @LARGE  DOSES  . Hydrochlorothiazide Other (See Comments)    Leg cramps  . Irbesartan Other (See Comments)    Headaches   .  Symbicort [Budesonide-Formoterol Fumarate] Other (See Comments)    legcramps    You were cared for by a hospitalist during your hospital stay. If you have any questions about your discharge medications or the care you received while you were in the hospital after you are discharged, you can call the unit and asked to speak with the hospitalist on call if the hospitalist that took care of you is not available. Once you are discharged, your primary care physician will handle any further medical issues. Please note that no refills for any discharge medications will be authorized once you are discharged, as it is imperative that you return to your primary care physician (or establish a relationship with a primary care physician if you do not have one) for your aftercare needs so that they can reassess your need for medications and monitor your lab values.  Consultations:  Neurology   Procedures/Studies: Ct Angio Head W Or Wo Contrast  Result Date: 04/20/2018 CLINICAL  DATA:  Follow-up examination for acute stroke. EXAM: CT ANGIOGRAPHY HEAD AND NECK TECHNIQUE: Multidetector CT imaging of the head and neck was performed using the standard protocol during bolus administration of intravenous contrast. Multiplanar CT image reconstructions and MIPs were obtained to evaluate the vascular anatomy. Carotid stenosis measurements (when applicable) are obtained utilizing NASCET criteria, using the distal internal carotid diameter as the denominator. CONTRAST:  <See Chart> ISOVUE-370 IOPAMIDOL (ISOVUE-370) INJECTION 76% COMPARISON:  Prior CT and MRI from earlier same day. FINDINGS: CTA NECK FINDINGS Aortic arch: Visualized aortic arch of normal caliber with normal branch pattern. Scattered atheromatous plaque within the arch and visualized proximal descending intrathoracic aorta. No flow-limiting stenosis about the origin of the great vessels. Atheromatous plaque at the origin of the right subclavian artery with  associated mild approximate 30% stenosis. Visualized subclavian arteries otherwise widely patent. Right carotid system: Right CCA patent from its origin to the bifurcation without stenosis. Mixed plaque at the right bifurcation/proximal right ICA with associated stenosis of up to 60% by NASCET criteria. Right ICA otherwise widely patent distally to the skull base. Left carotid system: Left CCA patent from its origin to the bifurcation without stenosis. Bulky eccentric calcified plaque at the left bifurcation/proximal left ICA with associated stenosis of up to approximately 60% by NASCET criteria as well. Left ICA otherwise widely patent distally to the skull base. Vertebral arteries: Both of the vertebral arteries arise from the subclavian arteries. Focal plaque at the origin of the right vertebral artery with mild stenosis. Soft plaque at the proximal right V1 segment with associated moderate approximate 50% stenosis. Vertebral arteries otherwise widely patent within the neck. Skeleton: No appreciable acute osseous abnormality. Thoracolumbar scoliosis with multiple segmental anomalies noted. No worrisome osseous lesions. Other neck: No other acute soft tissue abnormality within the neck. Approximate 3.2 cm heterogeneous right thyroid nodule (series 5, image 50). Additional 15 mm left thyroid nodule (series 5, image 45). Upper chest: Visualized upper chest demonstrates no acute finding. Calcified granuloma noted within the anterior right upper lobe. Few scattered ground-glass nodular densities within the peripheral right upper lobe noted more superiorly, suspected to reflect mucoid impaction and/or minimal small airways disease. 4 mm pleural based right upper lobe nodule noted posteriorly (series 5, image 13). Review of the MIP images confirms the above findings CTA HEAD FINDINGS Anterior circulation: Petrous segments widely patent bilaterally. Cavernous and supraclinoid left ICA widely patent. Minimal atheromatous  plaque within the cavernous right ICA without stenosis. Right ICA otherwise unremarkable widely patent to the terminus. A1 segments widely patent. Normal anterior communicating artery. Anterior cerebral arteries widely patent to their distal aspects. No M1 stenosis or occlusion. Distal MCA branches well perfused and symmetric. Posterior circulation: Focal non stenotic plaque within the right V4 segment just prior to the takeoff of the right PICA. Vertebral arteries otherwise unremarkable widely patent vertebrobasilar junction. Posterior inferior cerebral arteries patent bilaterally. Basilar mildly tortuous but widely patent to its distal aspect. Superior cerebral arteries patent bilaterally. Both of the posterior cerebral arteries primarily supplied via the basilar and are widely patent to their distal aspects. Small left posterior communicating artery noted. Venous sinuses: Patent. Anatomic variants: None significant. Delayed phase: No abnormal enhancement. Review of the MIP images confirms the above findings IMPRESSION: 1. Negative CTA for large vessel occlusion. 2. Approximate 60% atheromatous stenoses about the carotid bifurcations/proximal ICAs bilaterally. 3. Mild stenosis at the origin of the left vertebral artery, with more moderate 50% proximal right V1 stenosis. 4. Otherwise wide patency of  the major arterial vasculature of the head and neck. 5. 3.2 cm right thyroid and 1.5 cm left thyroid nodules. Follow-up examination with nonemergent dedicated thyroid ultrasound recommended for further characterization. 6. 4 mm pleural based right upper lobe nodule, indeterminate. No follow-up needed if patient is low-risk. Non-contrast chest CT can be considered in 12 months if patient is high-risk. This recommendation follows the consensus statement: Guidelines for Management of Incidental Pulmonary Nodules Detected on CT Images: From the Fleischner Society 2017; Radiology 2017; 284:228-243. Electronically Signed    By: Jeannine Boga M.D.   On: 04/20/2018 02:07   Ct Angio Neck W Or Wo Contrast  Result Date: 04/20/2018 CLINICAL DATA:  Follow-up examination for acute stroke. EXAM: CT ANGIOGRAPHY HEAD AND NECK TECHNIQUE: Multidetector CT imaging of the head and neck was performed using the standard protocol during bolus administration of intravenous contrast. Multiplanar CT image reconstructions and MIPs were obtained to evaluate the vascular anatomy. Carotid stenosis measurements (when applicable) are obtained utilizing NASCET criteria, using the distal internal carotid diameter as the denominator. CONTRAST:  <See Chart> ISOVUE-370 IOPAMIDOL (ISOVUE-370) INJECTION 76% COMPARISON:  Prior CT and MRI from earlier same day. FINDINGS: CTA NECK FINDINGS Aortic arch: Visualized aortic arch of normal caliber with normal branch pattern. Scattered atheromatous plaque within the arch and visualized proximal descending intrathoracic aorta. No flow-limiting stenosis about the origin of the great vessels. Atheromatous plaque at the origin of the right subclavian artery with associated mild approximate 30% stenosis. Visualized subclavian arteries otherwise widely patent. Right carotid system: Right CCA patent from its origin to the bifurcation without stenosis. Mixed plaque at the right bifurcation/proximal right ICA with associated stenosis of up to 60% by NASCET criteria. Right ICA otherwise widely patent distally to the skull base. Left carotid system: Left CCA patent from its origin to the bifurcation without stenosis. Bulky eccentric calcified plaque at the left bifurcation/proximal left ICA with associated stenosis of up to approximately 60% by NASCET criteria as well. Left ICA otherwise widely patent distally to the skull base. Vertebral arteries: Both of the vertebral arteries arise from the subclavian arteries. Focal plaque at the origin of the right vertebral artery with mild stenosis. Soft plaque at the proximal right V1  segment with associated moderate approximate 50% stenosis. Vertebral arteries otherwise widely patent within the neck. Skeleton: No appreciable acute osseous abnormality. Thoracolumbar scoliosis with multiple segmental anomalies noted. No worrisome osseous lesions. Other neck: No other acute soft tissue abnormality within the neck. Approximate 3.2 cm heterogeneous right thyroid nodule (series 5, image 50). Additional 15 mm left thyroid nodule (series 5, image 45). Upper chest: Visualized upper chest demonstrates no acute finding. Calcified granuloma noted within the anterior right upper lobe. Few scattered ground-glass nodular densities within the peripheral right upper lobe noted more superiorly, suspected to reflect mucoid impaction and/or minimal small airways disease. 4 mm pleural based right upper lobe nodule noted posteriorly (series 5, image 13). Review of the MIP images confirms the above findings CTA HEAD FINDINGS Anterior circulation: Petrous segments widely patent bilaterally. Cavernous and supraclinoid left ICA widely patent. Minimal atheromatous plaque within the cavernous right ICA without stenosis. Right ICA otherwise unremarkable widely patent to the terminus. A1 segments widely patent. Normal anterior communicating artery. Anterior cerebral arteries widely patent to their distal aspects. No M1 stenosis or occlusion. Distal MCA branches well perfused and symmetric. Posterior circulation: Focal non stenotic plaque within the right V4 segment just prior to the takeoff of the right PICA. Vertebral arteries otherwise unremarkable  widely patent vertebrobasilar junction. Posterior inferior cerebral arteries patent bilaterally. Basilar mildly tortuous but widely patent to its distal aspect. Superior cerebral arteries patent bilaterally. Both of the posterior cerebral arteries primarily supplied via the basilar and are widely patent to their distal aspects. Small left posterior communicating artery noted.  Venous sinuses: Patent. Anatomic variants: None significant. Delayed phase: No abnormal enhancement. Review of the MIP images confirms the above findings IMPRESSION: 1. Negative CTA for large vessel occlusion. 2. Approximate 60% atheromatous stenoses about the carotid bifurcations/proximal ICAs bilaterally. 3. Mild stenosis at the origin of the left vertebral artery, with more moderate 50% proximal right V1 stenosis. 4. Otherwise wide patency of the major arterial vasculature of the head and neck. 5. 3.2 cm right thyroid and 1.5 cm left thyroid nodules. Follow-up examination with nonemergent dedicated thyroid ultrasound recommended for further characterization. 6. 4 mm pleural based right upper lobe nodule, indeterminate. No follow-up needed if patient is low-risk. Non-contrast chest CT can be considered in 12 months if patient is high-risk. This recommendation follows the consensus statement: Guidelines for Management of Incidental Pulmonary Nodules Detected on CT Images: From the Fleischner Society 2017; Radiology 2017; 284:228-243. Electronically Signed   By: Jeannine Boga M.D.   On: 04/20/2018 02:07   [name] Head Wo Contrast  Result Date: 04/19/2018 CLINICAL DATA:  Right eye blurred vision. Transient left-sided facial and arm numbness last week. EXAM: MRI HEAD WITHOUT CONTRAST MRA HEAD WITHOUT CONTRAST TECHNIQUE: Multiplanar, multiecho pulse sequences of the brain and surrounding structures were obtained without intravenous contrast. Angiographic images of the head were obtained using MRA technique without contrast. COMPARISON:  None. FINDINGS: MRI HEAD FINDINGS Brain: There are several punctate acute to early subacute cortical infarcts in the right parietal and posterior right frontal lobes. No intracranial hemorrhage, mass, midline shift, or extra-axial fluid collection is identified. Generalized cerebral atrophy is mild for age. Patchy T2 hyperintensities in the cerebral white matter bilaterally  are nonspecific but compatible with moderate chronic small vessel ischemic disease. Vascular: Major intracranial vascular flow voids are preserved. Skull and upper cervical spine: Unremarkable bone marrow signal. Sinuses/Orbits: Unremarkable orbits. Minimal left maxillary sinus mucosal thickening. Clear mastoid air cells. Other: None. MRA HEAD FINDINGS There is mild motion artifact. The visualized distal vertebral arteries are patent to the basilar and codominant. Patent PICA and SCA origins are seen bilaterally. The basilar artery is widely patent. There is a small left posterior communicating artery. A right posterior communicating artery is not identified and may be diminutive or absent. Both PCAs are patent without evidence of significant proximal stenosis. The internal carotid arteries are patent from skull base to carotid termini with suspected artifact rather than stenosis near the proximal aspects of both petrous segments. ACAs and MCAs are patent without evidence of proximal branch occlusion or significant stenosis. No aneurysm is identified. IMPRESSION: 1. Punctate acute to early subacute infarcts in the right parietal and posterior frontal lobes. 2. Moderate chronic small vessel ischemic disease. 3. Negative head MRA. Electronically Signed   By: Logan Bores M.D.   On: 04/19/2018 19:03   Mr Brain Wo Contrast  Result Date: 04/19/2018 CLINICAL DATA:  Right eye blurred vision. Transient left-sided facial and arm numbness last week. EXAM: MRI HEAD WITHOUT CONTRAST MRA HEAD WITHOUT CONTRAST TECHNIQUE: Multiplanar, multiecho pulse sequences of the brain and surrounding structures were obtained without intravenous contrast. Angiographic images of the head were obtained using MRA technique without contrast. COMPARISON:  None. FINDINGS: MRI HEAD FINDINGS Brain: There are  several punctate acute to early subacute cortical infarcts in the right parietal and posterior right frontal lobes. No intracranial  hemorrhage, mass, midline shift, or extra-axial fluid collection is identified. Generalized cerebral atrophy is mild for age. Patchy T2 hyperintensities in the cerebral white matter bilaterally are nonspecific but compatible with moderate chronic small vessel ischemic disease. Vascular: Major intracranial vascular flow voids are preserved. Skull and upper cervical spine: Unremarkable bone marrow signal. Sinuses/Orbits: Unremarkable orbits. Minimal left maxillary sinus mucosal thickening. Clear mastoid air cells. Other: None. MRA HEAD FINDINGS There is mild motion artifact. The visualized distal vertebral arteries are patent to the basilar and codominant. Patent PICA and SCA origins are seen bilaterally. The basilar artery is widely patent. There is a small left posterior communicating artery. A right posterior communicating artery is not identified and may be diminutive or absent. Both PCAs are patent without evidence of significant proximal stenosis. The internal carotid arteries are patent from skull base to carotid termini with suspected artifact rather than stenosis near the proximal aspects of both petrous segments. ACAs and MCAs are patent without evidence of proximal branch occlusion or significant stenosis. No aneurysm is identified. IMPRESSION: 1. Punctate acute to early subacute infarcts in the right parietal and posterior frontal lobes. 2. Moderate chronic small vessel ischemic disease. 3. Negative head MRA. Electronically Signed   By: Logan Bores M.D.   On: 04/19/2018 19:03   Vas US Carotid  Result Date: 04/20/2018 Carotid Arterial Duplex Study Indications:       CVA, Numbness and Blurry vision. Risk Factors:      Hypertension, hyperlipidemia. Other Factors:     Paroxysmal Atrial fibrillation. CTA of neck showed 60% ICA                    stenosis at bilateral bifurcations. Comparison Study:  No prior study on file for comparison Performing Technologist: Sharion Dove RVS  Examination Guidelines: A  complete evaluation includes B-mode imaging, spectral Doppler, color Doppler, and power Doppler as needed of all accessible portions of each vessel. Bilateral testing is considered an integral part of a complete examination. Limited examinations for reoccurring indications may be performed as noted.  Right Carotid Findings: +----------+--------+--------+--------+---------------------+------------------+           PSV cm/sEDV cm/sStenosisDescribe             Comments           +----------+--------+--------+--------+---------------------+------------------+ CCA Prox  91      20                                   intimal thickening +----------+--------+--------+--------+---------------------+------------------+ CCA Distal81      20                                   intimal thickening +----------+--------+--------+--------+---------------------+------------------+ ICA Prox  243     63      60-79%  calcific and         Shadowing                                            irregular                               +----------+--------+--------+--------+---------------------+------------------+  ICA Mid   108     24                                                      +----------+--------+--------+--------+---------------------+------------------+ ICA Distal78      29                                                      +----------+--------+--------+--------+---------------------+------------------+ ECA       104     19                                                      +----------+--------+--------+--------+---------------------+------------------+ +----------+--------+-------+--------+-------------------+           PSV cm/sEDV cmsDescribeArm Pressure (mmHG) +----------+--------+-------+--------+-------------------+ [id number]                                        +----------+--------+-------+--------+-------------------+  +---------+--------+--+--------+--+ VertebralPSV cm/s64EDV cm/s23 +---------+--------+--+--------+--+  Left Carotid Findings: +----------+--------+--------+--------+--------+------------------+           PSV cm/sEDV cm/sStenosisDescribeComments           +----------+--------+--------+--------+--------+------------------+ CCA Prox  76      15                      intimal thickening +----------+--------+--------+--------+--------+------------------+ CCA Distal66      18                      intimal thickening +----------+--------+--------+--------+--------+------------------+ ICA Prox  163     48      40-59%  calcificShadowing          +----------+--------+--------+--------+--------+------------------+ ICA Mid   106     24                                         +----------+--------+--------+--------+--------+------------------+ ICA Distal85      27                                         +----------+--------+--------+--------+--------+------------------+ ECA       114     22                                         +----------+--------+--------+--------+--------+------------------+ +----------+--------+--------+--------+-------------------+ SubclavianPSV cm/sEDV cm/sDescribeArm Pressure (mmHG) +----------+--------+--------+--------+-------------------+           158                                         +----------+--------+--------+--------+-------------------+ +---------+--------+--+--------+--+ VertebralPSV cm/s50EDV cm/s12 +---------+--------+--+--------+--+  Summary: Right  Carotid: Velocities in the right ICA are consistent with a 60-79%                stenosis. Left Carotid: Velocities in the left ICA are consistent with a 40-59% stenosis. Vertebrals:  Bilateral vertebral arteries demonstrate antegrade flow. Subclavians: Normal flow hemodynamics were seen in bilateral subclavian              arteries. *See table(s) above for measurements  and observations.     Preliminary      Subjective: No complaints, feels better.    Discharge Exam: Vitals:   04/21/18 0813 04/21/18 0900  BP: (!) 143/79 (!) 141/72  Pulse: 63 68  Resp: 17 18  Temp: 98.4 F (36.9 C) 98.1 F (36.7 C)  SpO2: 97% 98%   Vitals:   04/21/18 0053 04/21/18 0402 04/21/18 0813 04/21/18 0900  BP: 139/68 135/80 (!) 143/79 (!) 141/72  Pulse: 61 (!) 58 63 68  Resp: 18 18 17 18   Temp: 97.7 F (36.5 C) 97.8 F (36.6 C) 98.4 F (36.9 C) 98.1 F (36.7 C)  TempSrc: Oral Oral Oral Oral  SpO2: 97% 97% 97% 98%  Weight:      Height:        General: Pt is alert, awake, not in acute distress Cardiovascular: RRR, S1/S2 +, no rubs, no gallops Respiratory: CTA bilaterally, no wheezing, no rhonchi Abdominal: Soft, NT, ND, bowel sounds + Extremities: no edema, no cyanosis    The results of significant diagnostics from this hospitalization (including imaging, microbiology, ancillary and laboratory) are listed below for reference.     Microbiology: No results found for this or any previous visit (from the past 240 hour(s)).   Labs: BNP (last 3 results) No results for input(s): BNP in the last 8760 hours. Basic Metabolic Panel: Recent Labs  Lab 04/19/18 1524  NA 140  K 4.2  CL 109  CO2 23  GLUCOSE 134*  BUN 15  CREATININE 1.16  CALCIUM 9.1   Liver Function Tests: Recent Labs  Lab 04/19/18 1524  AST 15  ALT 14  ALKPHOS 82  BILITOT 0.5  PROT 6.8  ALBUMIN 3.5   No results for input(s): LIPASE, AMYLASE in the last 168 hours. No results for input(s): AMMONIA in the last 168 hours. CBC: Recent Labs  Lab 04/19/18 1524  WBC 10.3  NEUTROABS 6.8  HGB 13.8  HCT 41.9  MCV 85.3  PLT 322   Cardiac Enzymes: No results for input(s): CKTOTAL, CKMB, CKMBINDEX, TROPONINI in the last 168 hours. BNP: Invalid input(s): POCBNP CBG: No results for input(s): GLUCAP in the last 168 hours. D-Dimer No results for input(s): DDIMER in the last 72  hours. Hgb A1c Recent Labs    04/20/18 0718  HGBA1C 6.1*   Lipid Profile Recent Labs    04/20/18 0718  CHOL 133  HDL 24*  LDLCALC 63  TRIG 230*  CHOLHDL 5.5   Thyroid function studies No results for input(s): TSH, T4TOTAL, T3FREE, THYROIDAB in the last 72 hours.  Invalid input(s): FREET3 Anemia work up No results for input(s): VITAMINB12, FOLATE, FERRITIN, TIBC, IRON, RETICCTPCT in the last 72 hours. Urinalysis    Component Value Date/Time   COLORURINE yellow 05/04/2009 0838   APPEARANCEUR Clear 05/04/2009 0838   LABSPEC 1.025 05/04/2009 0838   PHURINE 5.0 05/04/2009 0838   HGBUR negative 05/04/2009 0838   BILIRUBINUR neg 02/24/2018 1222   PROTEINUR Negative 02/24/2018 1222   UROBILINOGEN 0.2 02/24/2018 1222   UROBILINOGEN 0.2 05/04/2009 1761  NITRITE neg 02/24/2018 1222   NITRITE negative 05/04/2009 0838   LEUKOCYTESUR Negative 02/24/2018 1222   Sepsis Labs Invalid input(s): PROCALCITONIN,  WBC,  LACTICIDVEN Microbiology No results found for this or any previous visit (from the past 240 hour(s)).   Time coordinating discharge:  I have spent 35 minutes face to face with the patient and on the ward discussing the patients care, assessment, plan and disposition with other care givers. >50% of the time was devoted counseling the patient about the risks and benefits of treatment/Discharge disposition and coordinating care.   SIGNED:   Damita Lack, MD  [facility] 04/21/2018, 11:23 AM   If 7PM-7AM, please contact night-coverage [email or web address]

## 2018-04-21 NOTE — Care Management Note (Signed)
Case Management Note  Patient Details  Name: Luke [name] MRN: [id number] Date of Birth: [birth year]  Subjective/Objective:     Pt admitted with a stroke. He is from home with spouse.  DME: none No issues obtaining meds.  No issues with transportation.               Action/Plan: Pt discharging home with self care. No f/u per PT/OT and no DME needs. Pt discharging on Eliquis. Pt had 30 day free card and will f/u with his pharmacy on the cost post 30 days. Pt has transportation home.   Expected Discharge Date:  04/21/18               Expected Discharge Plan:  Home/Self Care  In-House Referral:     Discharge planning Services  CM Consult(Eliquis)  Post Acute Care Choice:    Choice offered to:     DME Arranged:    DME Agency:     HH Arranged:    HH Agency:     Status of Service:  Completed, signed off  If discussed at [employer] of Stay Meetings, dates discussed:    Additional Comments:  Pollie Friar, RN 04/21/2018, 11:35 AM

## 2018-04-28 ENCOUNTER — Ambulatory Visit (INDEPENDENT_AMBULATORY_CARE_PROVIDER_SITE_OTHER): Payer: Medicare Other | Admitting: Family Medicine

## 2018-04-28 ENCOUNTER — Encounter: Payer: Self-pay | Admitting: Family Medicine

## 2018-04-28 VITALS — BP 126/84 | HR 54 | Temp 98.6°F | Ht 67.0 in | Wt 190.1 lb

## 2018-04-28 DIAGNOSIS — I10 Essential (primary) hypertension: Secondary | ICD-10-CM | POA: Diagnosis not present

## 2018-04-28 DIAGNOSIS — I480 Paroxysmal atrial fibrillation: Secondary | ICD-10-CM | POA: Diagnosis not present

## 2018-04-28 DIAGNOSIS — I639 Cerebral infarction, unspecified: Secondary | ICD-10-CM

## 2018-04-28 DIAGNOSIS — E042 Nontoxic multinodular goiter: Secondary | ICD-10-CM

## 2018-04-28 DIAGNOSIS — R911 Solitary pulmonary nodule: Secondary | ICD-10-CM | POA: Diagnosis not present

## 2018-04-28 DIAGNOSIS — I6523 Occlusion and stenosis of bilateral carotid arteries: Secondary | ICD-10-CM | POA: Diagnosis not present

## 2018-04-28 NOTE — Progress Notes (Signed)
   Subjective:    Patient ID: Luke [name], male    DOB: 1945/12/09, 73 y.o.   MRN: [id number]  HPI Here to follow up a hospital stay from 04-19-18 to 04-21-18 for a stroke in the right parietal and right posterior frontal areas. He presented with numbness in the left face and left arm, as well as altered vision in the left eye. He has a hx of paroxysmal atrial fibrillation and he was only on aspirin at the time. His ECHO showed no thrombus and an EF of 60-65%. Carotid dopplers showed a 60-79% stenosis on the RICA and 81-27% of the LICA. Additional scans discovered a 4 mm pleural based nodule in the RUL of the lungs. Also seen was a 3.2 cm nodule in the right thyroid lobe and a 1.5 cm nodule in the left thyroid lobe. He was started on Eliquis, and the neurologic deficits quickly resolved. Since going home he has felt fine. He is concerned about the high cost of the Eliquis.    Review of Systems  Constitutional: Negative.   Respiratory: Negative.   Cardiovascular: Negative.   Neurological: Negative.        Objective:   Physical Exam Constitutional:      Appearance: Normal appearance.  Cardiovascular:     Rate and Rhythm: Normal rate and regular rhythm.     Pulses: Normal pulses.     Heart sounds: Normal heart sounds.  Pulmonary:     Effort: Pulmonary effort is normal. No respiratory distress.     Breath sounds: Normal breath sounds. No stridor. No wheezing, rhonchi or rales.  Neurological:     General: No focal deficit present.     Mental Status: He is alert and oriented to person, place, and time. Mental status is at baseline.           Assessment & Plan:  He is recovering from a recent stroke, and he seems to have no residual deficits. He is scheduled to see Dr. Percival Spanish on 05-16-18 and to see Dr. Leonie Man on 05-29-18. We gave him a 30 day supply of samples of Eliquis to use until he sees Cardiology, and they can possibly find a more affordable alternative for him. He also plans  to see a doctor at the [place], since he could get medications there much more cheaply. We will plan an repeating a lung CT in 12 months to follow the RUL nodule. We will also set up a thyroid US soon to evaluate the thyroid nodules. We will arrange for him to see Vascular Surgery to evaluate the right carotid lesions.  [name], MD

## 2018-04-29 DIAGNOSIS — R911 Solitary pulmonary nodule: Secondary | ICD-10-CM | POA: Insufficient documentation

## 2018-04-29 DIAGNOSIS — E042 Nontoxic multinodular goiter: Secondary | ICD-10-CM | POA: Insufficient documentation

## 2018-04-29 DIAGNOSIS — I6523 Occlusion and stenosis of bilateral carotid arteries: Secondary | ICD-10-CM | POA: Insufficient documentation

## 2018-05-13 ENCOUNTER — Telehealth: Payer: Self-pay

## 2018-05-13 NOTE — Telephone Encounter (Signed)
Pt started on Eliquis for PAF most likely cause of CVA in Feb 2020.  Eliquis just started.   Pt has appt 05/16/18 with Dr. Percival Spanish, I have left message with pt to call back to see how soon tooth needs removal.  Would prefer for him to see Dr. Vita Barley first.

## 2018-05-13 NOTE — Telephone Encounter (Signed)
   [facility] Pre-operative Risk Assessment    Request for surgical clearance:  1. What type of surgery is being performed? Tooth extraction  2. When is this surgery scheduled? TBD  3. What type of clearance is required (medical clearance vs. Pharmacy clearance to hold med vs. Both)? Both due to patient have a recent minor stroke  4. Are there any medications that need to be held prior to surgery and how long? Eliquis  5. Practice name and name of physician performing surgery? [facility]  Dr.Jason Mohorn  6. What is your office phone number [phone]   7.   What is your office fax number  [phone]  8.   Anesthesia type  Local with nitrous oxide   Kathyrn Lass 05/13/2018, 9:47 AM  _________________________________________________________________   (provider comments below)

## 2018-05-14 ENCOUNTER — Ambulatory Visit
Admission: RE | Admit: 2018-05-14 | Discharge: 2018-05-14 | Disposition: A | Discharge status: Home / Self Care | Payer: Medicare Other | Source: Ambulatory Visit | Attending: Family Medicine | Admitting: Family Medicine

## 2018-05-14 DIAGNOSIS — E042 Nontoxic multinodular goiter: Secondary | ICD-10-CM

## 2018-05-14 NOTE — Telephone Encounter (Signed)
   Primary Cardiologist:James Hochrein, MD  Chart reviewed as part of pre-operative protocol coverage. Because of Daking Westervelt Mealey's past medical history and time since last visit, he/she will require a follow-up visit in order to better assess preoperative cardiovascular risk.  Pt is to see Dr. Percival Spanish on Friday the 13th and Dr. Leighton Roach will give Pre-op clearance.  Please note we do not recommending holding anticoagulation for single tooth extraction.    Luke Hales, NP  05/14/2018, 10:48 AM

## 2018-05-14 NOTE — Telephone Encounter (Signed)
Follow Up:   Mickel Baas from Dr [facility] office, calling to check on the status of pt's clearance Please  fax clearance to [phone]

## 2018-05-15 NOTE — Progress Notes (Signed)
Cardiology Office Note   Date:  05/16/2018   ID:  Iran, Rowe 1945-04-26, MRN [id number]  PCP:  Laurey Morale, MD  Cardiologist:   Minus Breeding, MD Referring:  Laurey Morale, MD   Chief Complaint  Patient presents with  . Cerebrovascular Accident      History of Present Illness: Luke [name] is a 73 y.o. male who presents for follow up of atrial fib.    He was found to have atrial fib in the context of a URI.  He had a negative ischemia work up and unremarkable echo  He did not want to continue with the [place].   Since I last saw him he was in the hospital with a CVA.  I reviewed these records.  This appeared to be embolic.  He does have a history of atrial fibrillation and had not wanted to take Eliquis previously.  However, he is now on this.  He also had carotid stenosis as described below.  He is due to follow-up later this month with Dr Leonie Man.  He has not yet been scheduled with vascular surgery.Marland Kitchen  He is in need of dental surgery.    He denies any residual from the stroke.  His previous symptoms were dizziness and blurred vision.  This has resolved.  He is not had any cardiovascular symptoms.The patient denies any new symptoms such as chest discomfort, neck or arm discomfort. There has been no new shortness of breath, PND or orthopnea. There have been no reported palpitations, presyncope or syncope.   Past Medical History:  Diagnosis Date  . Asthma   . Depression   . ED (erectile dysfunction)   . Hyperlipidemia   . Hypertension   . Paroxysmal atrial fibrillation ([place]) 09/2016   Initial diagnosis was in setting of acute URI/asthma attack  . Solitary pulmonary nodule 04/16/2013   03/30/13  CXR Question left nipple shadow.  10 mm ovoid nodular density right upper lobe, cannot exclude  pulmonary mass/ nodule > f/u cxr 04/29/2013 > no nodule    . Ulcer     Past Surgical History:  Procedure Laterality Date  . COLONOSCOPY  04/27/2014   per [facility], repeat in  5 yrs   . NM MYOVIEW LTD  10/2016   ailable, images/films reviewed: From Epic Chart or Care Everywhere)  . TRANSTHORACIC ECHOCARDIOGRAM  35/5732   Normal systolic function. EF 5560%. No RWMA. Gr 1 DD. Mild AoV calcification - no stenosis. Atrial Septum - lipomatous hypertrophy. --Essentially normal.     Current Outpatient Medications  Medication Sig Dispense Refill  . acyclovir (ZOVIRAX) 200 MG capsule TAKE 1 CAPSULE DAILY (Patient taking differently: Take 200 mg by mouth daily. ) 90 capsule 3  . albuterol (PROVENTIL HFA;VENTOLIN HFA) 108 (90 Base) MCG/ACT inhaler Inhale 2 puffs into the lungs every 6 (six) hours as needed for wheezing or shortness of breath.    Marland Kitchen amLODipine (NORVASC) 10 MG tablet Take 1 tablet (10 mg total) by mouth daily. 90 tablet 3  . apixaban (ELIQUIS) 5 MG TABS tablet Take 1 tablet (5 mg total) by mouth 2 (two) times daily for 30 days. 60 tablet 0  . atorvastatin (LIPITOR) 10 MG tablet Take 1 tablet (10 mg total) by mouth at bedtime. 90 tablet 3  . carvedilol (COREG) 6.25 MG tablet Take 1 tablet (6.25 mg total) by mouth 2 (two) times daily with a meal. 180 tablet 3  . losartan (COZAAR) 100 MG tablet Take 1  tablet (100 mg total) by mouth daily. 90 tablet 3  . metaxalone (SKELAXIN) 800 MG tablet Take 1 tablet (800 mg total) by mouth 4 (four) times daily. (Patient taking differently: Take 800 mg by mouth 2 (two) times daily as needed for muscle spasms. ) 60 tablet 2  . omeprazole (PRILOSEC) 20 MG capsule Take 1 capsule (20 mg total) by mouth daily. 90 capsule 3  . sildenafil (VIAGRA) 50 MG tablet Take 1 tablet (50 mg total) by mouth as needed. (Patient taking differently: Take 50 mg by mouth as needed for erectile dysfunction. ) 10 tablet 11  . triamcinolone cream (KENALOG) 0.1 % Apply 1 application topically 2 (two) times daily. (Patient taking differently: Apply 1 application topically 2 (two) times daily as needed (rash). ) 45 g 2   No current facility-administered  medications for this visit.     Allergies:   Aspirin; Hydrochlorothiazide; Irbesartan; and Symbicort [budesonide-formoterol fumarate]    ROS:  Please see the history of present illness.   Otherwise, review of systems are positive for none.   All other systems are reviewed and negative.    PHYSICAL EXAM: VS:  BP 137/81   Pulse (!) 59   Ht 5\' 7"  (1.702 m)   Wt 189 lb 6.4 oz (85.9 kg)   SpO2 95%   BMI 29.66 kg/m  , BMI Body mass index is 29.66 kg/m. GENERAL:  Well appearing NECK:  No jugular venous distention, waveform within normal limits, carotid upstroke brisk and symmetric, no bruits, no thyromegaly LUNGS:  Clear to auscultation bilaterally CHEST:  Unremarkable HEART:  PMI not displaced or sustained,S1 and S2 within normal limits, no S3, no S4, no clicks, no rubs, no murmurs ABD:  Flat, positive bowel sounds normal in frequency in pitch, no bruits, no rebound, no guarding, no midline pulsatile mass, no hepatomegaly, no splenomegaly EXT:  2 plus pulses throughout, no edema, no cyanosis no clubbing   EKG:  EKG is not ordered today.   Recent Labs: 02/24/2018: TSH 1.84 04/19/2018: ALT 14; BUN 15; Creatinine, Ser 1.16; Hemoglobin 13.8; Platelets 322; Potassium 4.2; Sodium 140    Lipid Panel    Component Value Date/Time   CHOL 133 04/20/2018 0718   TRIG 230 (H) 04/20/2018 0718   HDL 24 (L) 04/20/2018 0718   CHOLHDL 5.5 04/20/2018 0718   VLDL 46 (H) 04/20/2018 0718   LDLCALC 63 04/20/2018 0718   LDLDIRECT 73.0 02/08/2015 0906      Wt Readings from Last 3 Encounters:  05/16/18 189 lb 6.4 oz (85.9 kg)  04/28/18 190 lb 2 oz (86.2 kg)  04/20/18 186 lb 1.1 oz (84.4 kg)      Other studies Reviewed: Additional studies/ records that were reviewed today include:  Hospital records Review of the above records demonstrates:  See above.    ASSESSMENT AND PLAN:   ATRIAL FIB:  Luke [name] has a CHA2DS2 - VASc score of  4.  He is now taking his Eliquis.  No change in  therapy is indicated.  This was likely the etiology of his stroke.  HTN: The blood pressure is at target.  Continue current medications.   CAROTID DOPPLER: 60 to 79% right carotid stenosis and 40 to 59% left stenosis.  I am going to get him a follow-up appointment in vascular surgery.  DYSLIPIDEMIA: LDL is at target.  Continue current medical therapy.  DENTAL SURGERY: I would like for him to hold off on dental surgery and interrupting his anticoagulation.  He needs to have the tooth extracted.  I will see him back in 3 months prior to considering this.  Current medicines are reviewed at length with the patient today.  The patient does not have concerns regarding medicines.  The following changes have been made:  None  Labs/ tests ordered today include:    Orders Placed This Encounter  Procedures  . Ambulatory referral to Vascular Surgery     Disposition:   FU with me in 3 months.     Signed, Minus Breeding, MD  05/16/2018 9:17 AM    [place]

## 2018-05-16 ENCOUNTER — Encounter: Payer: Self-pay | Admitting: Cardiology

## 2018-05-16 ENCOUNTER — Encounter: Payer: Self-pay | Admitting: *Deleted

## 2018-05-16 ENCOUNTER — Other Ambulatory Visit: Payer: Self-pay

## 2018-05-16 ENCOUNTER — Ambulatory Visit (INDEPENDENT_AMBULATORY_CARE_PROVIDER_SITE_OTHER): Payer: Medicare Other | Admitting: Cardiology

## 2018-05-16 VITALS — BP 137/81 | HR 59 | Ht 67.0 in | Wt 189.4 lb

## 2018-05-16 DIAGNOSIS — I6340 Cerebral infarction due to embolism of unspecified cerebral artery: Secondary | ICD-10-CM | POA: Insufficient documentation

## 2018-05-16 DIAGNOSIS — I10 Essential (primary) hypertension: Secondary | ICD-10-CM

## 2018-05-16 DIAGNOSIS — D225 Melanocytic nevi of trunk: Secondary | ICD-10-CM | POA: Diagnosis not present

## 2018-05-16 DIAGNOSIS — L814 Other melanin hyperpigmentation: Secondary | ICD-10-CM | POA: Diagnosis not present

## 2018-05-16 DIAGNOSIS — D229 Melanocytic nevi, unspecified: Secondary | ICD-10-CM | POA: Diagnosis not present

## 2018-05-16 DIAGNOSIS — I6523 Occlusion and stenosis of bilateral carotid arteries: Secondary | ICD-10-CM | POA: Diagnosis not present

## 2018-05-16 DIAGNOSIS — D1801 Hemangioma of skin and subcutaneous tissue: Secondary | ICD-10-CM | POA: Diagnosis not present

## 2018-05-16 DIAGNOSIS — E785 Hyperlipidemia, unspecified: Secondary | ICD-10-CM | POA: Insufficient documentation

## 2018-05-16 DIAGNOSIS — R202 Paresthesia of skin: Secondary | ICD-10-CM | POA: Diagnosis not present

## 2018-05-16 DIAGNOSIS — L853 Xerosis cutis: Secondary | ICD-10-CM | POA: Diagnosis not present

## 2018-05-16 DIAGNOSIS — I480 Paroxysmal atrial fibrillation: Secondary | ICD-10-CM

## 2018-05-16 NOTE — Patient Instructions (Signed)
Medication Instructions:  Continue current medications  If you need a refill on your cardiac medications before your next appointment, please call your pharmacy.  Labwork: None Ordered   Testing/Procedures: None Ordered  Follow-Up: You have been referred to Vascular Surgery Dr Trula Slade  You will need a follow up appointment in 3 months.  Please call our office 2 months in advance to schedule this appointment.  You may see Minus Breeding, MD or one of the following Advanced Practice Providers on your designated Care Team:   Rosaria Ferries, PA-C . Jory Sims, DNP, ANP      At [facility], you and your health needs are our priority.  As part of our continuing mission to provide you with exceptional heart care, we have created designated Provider Care Teams.  These Care Teams include your primary Cardiologist (physician) and Advanced Practice Providers (APPs -  Physician Assistants and Nurse Practitioners) who all work together to provide you with the care you need, when you need it.   Thank you for choosing [facility] at [facility]!!

## 2018-05-16 NOTE — Addendum Note (Signed)
Addended by: [name] A on: 05/16/2018 04:09 PM   Modules accepted: Orders

## 2018-05-20 ENCOUNTER — Telehealth: Payer: Self-pay | Admitting: *Deleted

## 2018-05-20 ENCOUNTER — Ambulatory Visit (INDEPENDENT_AMBULATORY_CARE_PROVIDER_SITE_OTHER): Payer: Medicare Other | Admitting: Family Medicine

## 2018-05-20 ENCOUNTER — Other Ambulatory Visit: Payer: Self-pay

## 2018-05-20 ENCOUNTER — Encounter: Payer: Self-pay | Admitting: Family Medicine

## 2018-05-20 VITALS — BP 130/80 | HR 66 | Temp 98.2°F | Wt 189.1 lb

## 2018-05-20 DIAGNOSIS — I480 Paroxysmal atrial fibrillation: Secondary | ICD-10-CM | POA: Diagnosis not present

## 2018-05-20 DIAGNOSIS — E042 Nontoxic multinodular goiter: Secondary | ICD-10-CM | POA: Diagnosis not present

## 2018-05-20 DIAGNOSIS — I6523 Occlusion and stenosis of bilateral carotid arteries: Secondary | ICD-10-CM | POA: Diagnosis not present

## 2018-05-20 NOTE — Telephone Encounter (Signed)
   [facility] Pre-operative Risk Assessment    Request for surgical clearance:  1. What type of surgery is being performed?  THYROID BIOPSY  2. When is this surgery scheduled?  TBD  3. What type of clearance is required (medical clearance vs. Pharmacy clearance to hold med vs. Both)? BOTH  4. Are there any medications that need to be held prior to surgery and how long?ELIQUIS FOR 48 HOURS PRIOR   5. Practice name and name of physician performing surgery? [facility]  6. What is your office phone number [phone]   7.   What is your office fax number  [phone]  8.   Anesthesia type (None, local, MAC, general) ?UNKNOWN   Luke [name] 05/20/2018, 5:03 PM  _________________________________________________________________   (provider comments below)

## 2018-05-20 NOTE — Progress Notes (Signed)
   Subjective:    Patient ID: Luke [name], male    DOB: 1945/04/07, 73 y.o.   MRN: [id number]  HPI Here with questions about his upcoming appointments. He saw Dr. Percival Spanish recently and he agreed that [facility] should stay on Eliquis. In fact he has an appt with the [facility] later this week, and hopefully he can get the Eliquis through their pharmacy. He feels fine and has complaints today. He will see Dr. Leonie Man for stroke follow up on 05-29-18 and he will see Dr. Carlis Abbott on 05-27-18 to evaluate his carotid disease. He has been contacted by Interventional Radiology as well to set up an needle biopsy of the right lobe thyroid nodule.    Review of Systems  Constitutional: Negative.   Respiratory: Negative.   Cardiovascular: Negative.   Neurological: Negative.        Objective:   Physical Exam Constitutional:      Appearance: Normal appearance.  Neck:     Musculoskeletal: No neck rigidity.  Cardiovascular:     Rate and Rhythm: Normal rate and regular rhythm.     Pulses: Normal pulses.     Heart sounds: Normal heart sounds.  Pulmonary:     Effort: Pulmonary effort is normal.     Breath sounds: Normal breath sounds.  Lymphadenopathy:     Cervical: No cervical adenopathy.  Neurological:     General: No focal deficit present.     Mental Status: He is alert. Mental status is at baseline.           Assessment & Plan:  He is doing well after a stroke. He is stable from a cardiac standpoint. He will see Dr. Leonie Man and Dr. Carlis Abbott as above. I spoke to him about what to expect for the thyroid biopsy and about what the next steps might be. Recheck prn. [name], MD

## 2018-05-21 NOTE — Telephone Encounter (Signed)
Patient with diagnosis of afib on Eliquis for anticoagulation.    Procedure: THYROID BIOPSY Date of procedure: TBD  CHADS2-VASc score of  4 (CHF, HTN, AGE, DM2, stroke/tia x 2, CAD, AGE, male)  Patient has had a recent stroke (04/19/2018). He was started on Eliquis after the event. Patient was seen on 05/16/2018 by Dr. Percival Spanish for clearance for dental procedure. Dr. Percival Spanish had requested he not interrupt his anticoagulation at this time and follow back up with him in 3 months.  I will forward this to Dr. Percival Spanish for his opinion on when it is acceptable to interrupt patients anticoagulation for thyroid biopsy.

## 2018-05-25 NOTE — Telephone Encounter (Signed)
I spoke with the patient and his biopsy in postponed.  He will call us back when this is planned in the future and we will talk about bridging.

## 2018-05-27 ENCOUNTER — Inpatient Hospital Stay (HOSPITAL_COMMUNITY): Admission: RE | Admit: 2018-05-27 | Payer: Medicare Other | Source: Ambulatory Visit

## 2018-05-27 ENCOUNTER — Encounter: Payer: Medicare Other | Admitting: Vascular Surgery

## 2018-05-28 ENCOUNTER — Telehealth: Payer: Self-pay | Admitting: Neurology

## 2018-05-28 NOTE — Telephone Encounter (Signed)
Called and spoke to patient he gave  consent for Dr.sethi to call him via telephone call and he gave consent to bill his insurance . Dr. Leonie Man Patient will be looking forward to your telephone call .  Call 1 st home [phone]  2 nd 336- [phone]  Thanks Hinton Dyer .

## 2018-05-29 ENCOUNTER — Ambulatory Visit (INDEPENDENT_AMBULATORY_CARE_PROVIDER_SITE_OTHER): Payer: Medicare Other | Admitting: Neurology

## 2018-05-29 ENCOUNTER — Other Ambulatory Visit: Payer: Self-pay

## 2018-05-29 ENCOUNTER — Encounter: Payer: Self-pay | Admitting: Neurology

## 2018-05-29 ENCOUNTER — Encounter: Payer: Self-pay | Admitting: Family Medicine

## 2018-05-29 DIAGNOSIS — I482 Chronic atrial fibrillation, unspecified: Secondary | ICD-10-CM | POA: Diagnosis not present

## 2018-05-29 DIAGNOSIS — H538 Other visual disturbances: Secondary | ICD-10-CM | POA: Diagnosis not present

## 2018-05-29 NOTE — Progress Notes (Signed)
Virtual Visit via Telephone Note  I connected with Luke [name] on 05/29/18 at 11:00 AM EDT by telephone and verified that I am speaking with the correct person using two identifiers.   I discussed the limitations, risks, security and privacy concerns of performing an evaluation and management service by telephone and the availability of in person appointments. I also discussed with the patient that there may be a patient responsible charge related to this service. The patient expressed understanding and agreed to proceed.   History of Present Illness:  This was telephonic virtual visit with patient and his wife following hospital admission for stroke in Feb 2020.I personally reviewed HPI, electronic medical records and imaging films.Luke [name] is a 73 y.o. male past medical history of paroxysmal atrial fibrillation not on anticoagulation, hypertension, hyperlipidemia who presented to the emergency room for 3 to 4 days worth of blurred vision through his left eye.  He said that he started noticing symptoms all of a sudden on Thursday and did not make much of it.  His wife brought him into the emergency room because the symptoms were persistent. He had h/o AFIB but had not been on anticoagulation.MRI brain was done that showed scattered punctate embolic-looking infarcts in the right cerebral hemisphere.  No bleed.CTAH&N - Approximate 60% atheromatous stenoses about the carotid bifurcations/proximal ICAs bilaterally.   Carotid Doppler - Right Carotid: consistent with a 60-79% stenosis.  2D Echo  - EF 60 - 65%. No cardiac source of emboli identified.  LDL - 63 HbA1c 6.1 He has done well after DC Home. Blurred vision now resolved and no new stroke/TIa symptoms. Tolerating eliquis well without bleeding or bruising  Observations/Objective: Personally reviewed hospital EMR and imaging films.  Assessment and Plan:73 y.o. male with history of paroxysmal atrial fibrillation not on  anticoagulation, hypertension, moderate carotid stenosis and hyperlipidemia presenting with blurred vision through his left eye likely cardioembolic events from AFIB not on anticoagulation   Follow Up Instructions: I had a long d/w patient and his wife about his recent stroke, risk for recurrent stroke/TIAs, personally independently reviewed imaging studies and stroke evaluation results and answered questions.Continue Eliquis (apixaban) daily  for secondary stroke prevention and maintain strict control of hypertension with blood pressure goal below 130/90, diabetes with hemoglobin A1c goal below 6.5% and lipids with LDL cholesterol goal below 70 mg/dL. I also advised the patient to eat a healthy diet with plenty of whole grains, cereals, fruits and vegetables, exercise regularly and maintain ideal body weight . I advised him to keep BP log and d/w Dr Sarajane Jews medication adjustments.Followup in the future with my NP Jessica  in 3 months     I discussed the assessment and treatment plan with the patient. The patient was provided an opportunity to ask questions and all were answered. The patient agreed with the plan and demonstrated an understanding of the instructions.   The patient was advised to call back or seek an in-person evaluation if the symptoms worsen or if the condition fails to improve as anticipated.  I provided 35 minutes of non-face-to-face time during this encounter.   Antony Contras, MD

## 2018-05-30 ENCOUNTER — Ambulatory Visit: Payer: Self-pay | Admitting: *Deleted

## 2018-05-30 NOTE — Telephone Encounter (Signed)
Before we think about changing the dose of carvedilol, I would like tl get more data. Have the patient check his BP and heart rates 4 times a day over the weekend and then make him a Webex visit with me next week to talk about it

## 2018-05-30 NOTE — Telephone Encounter (Signed)
Patient is calling with concerns about his medication. He is a patient with Afib history and after speaking with physician at [email or web address] e-visit- he was told to contact PCP.   Prior to yesterday patient reports his systolic BP reading was way down-  but his readings have been normal and have symptoms resolved. Pulse was running high- he did talk with doctor Leonie Man and there was some discussion of increasing dosage of carvedilol.  Patient reports- 114/74 and P 57 - asymptomatic today  ( 102/63 P 88- yesterday- that was the concern- patient was feeling some palpitations and little dizziness)   Patient states he did send MyChart message yesterday with concerns- advised patient to continue to record readings and send to PCP so that any trends can be noticed. Please call back with any changes or symptoms- he states he will.

## 2018-06-02 NOTE — Telephone Encounter (Signed)
I would leave the medication dosages as they are

## 2018-06-02 NOTE — Telephone Encounter (Signed)
Dr. Fry please advise. Thanks  

## 2018-06-02 NOTE — Telephone Encounter (Signed)
Called and spoke with pt and he is aware of Dr. Fry's recs.  

## 2018-06-24 ENCOUNTER — Encounter: Payer: Self-pay | Admitting: Family Medicine

## 2018-06-24 ENCOUNTER — Other Ambulatory Visit: Payer: Self-pay

## 2018-06-24 ENCOUNTER — Ambulatory Visit (INDEPENDENT_AMBULATORY_CARE_PROVIDER_SITE_OTHER): Payer: Medicare Other | Admitting: Family Medicine

## 2018-06-24 VITALS — BP 122/64 | HR 66 | Temp 98.3°F | Wt 186.0 lb

## 2018-06-24 DIAGNOSIS — I6523 Occlusion and stenosis of bilateral carotid arteries: Secondary | ICD-10-CM

## 2018-06-24 DIAGNOSIS — L600 Ingrowing nail: Secondary | ICD-10-CM | POA: Diagnosis not present

## 2018-06-24 NOTE — Progress Notes (Signed)
   Subjective:    Patient ID: Luke [name], male    DOB: 1945/12/05, 73 y.o.   MRN: [id number]  HPI Here to check a painful toenail on the left great toe. The nail has been slowly growing thicker and more curled over the years. Now for 6 weeks he has had pain along the lateral edge of the nail. No redness or warmth. Soaking it in hot water does not help.    Review of Systems  Constitutional: Negative.   Respiratory: Negative.   Cardiovascular: Negative.   Skin: Negative for color change.       Objective:   Physical Exam Constitutional:      General: He is not in acute distress.    Appearance: Normal appearance.  Cardiovascular:     Rate and Rhythm: Normal rate and regular rhythm.     Pulses: Normal pulses.     Heart sounds: Normal heart sounds.  Pulmonary:     Effort: Pulmonary effort is normal.     Breath sounds: Normal breath sounds.  Skin:    Comments: The left great toenail has fungal involvement, it is thickened and sharply curled at the edges. The lateral edge is deeply ingrown in the skin. This is tender but does not appear to be infected   Neurological:     Mental Status: He is alert.           Assessment & Plan:  Ingrown toenail, refer to Podiatry. The nail needs to be removed, at least partially.  [name], MD

## 2018-06-26 ENCOUNTER — Encounter: Payer: Self-pay | Admitting: Podiatry

## 2018-06-26 ENCOUNTER — Other Ambulatory Visit: Payer: Self-pay

## 2018-06-26 ENCOUNTER — Ambulatory Visit (INDEPENDENT_AMBULATORY_CARE_PROVIDER_SITE_OTHER): Payer: Medicare Other | Admitting: Podiatry

## 2018-06-26 VITALS — Temp 97.5°F

## 2018-06-26 DIAGNOSIS — L600 Ingrowing nail: Secondary | ICD-10-CM | POA: Diagnosis not present

## 2018-06-26 DIAGNOSIS — I6523 Occlusion and stenosis of bilateral carotid arteries: Secondary | ICD-10-CM | POA: Diagnosis not present

## 2018-06-26 MED ORDER — NEOMYCIN-POLYMYXIN-HC 3.5-10000-1 OT SOLN: OTIC | 0 refills | Status: DC

## 2018-06-26 NOTE — Patient Instructions (Signed)

## 2018-06-26 NOTE — Progress Notes (Signed)
   Subjective:    Patient ID: Luke [name], male    DOB: [birth year], 73 y.o.   MRN: [id number]  HPI    Review of Systems  All other systems reviewed and are negative.      Objective:   Physical Exam        Assessment & Plan:

## 2018-06-27 NOTE — Progress Notes (Signed)
Subjective:   Patient ID: Luke [name], male   DOB: 73 y.o.   MRN: [id number]   HPI Patient presents stating that he has a painful ingrown toenail of his left big toe and it is been hard for him to wear shoe gear and he is tried to trim it and soak it and it is been around 2 months.  Patient does not smoke likes to be active   Review of Systems  All other systems reviewed and are negative.       Objective:  Physical Exam Vitals signs and nursing note reviewed.  Constitutional:      Appearance: He is well-developed.  Pulmonary:     Effort: Pulmonary effort is normal.  Musculoskeletal: Normal range of motion.  Skin:    General: Skin is warm.  Neurological:     Mental Status: He is alert.     Neurovascular status intact muscle strength adequate range of motion within normal limits with patient found to have incurvated left hallux lateral border that is painful when pressed with no active drainage redness noted and deformity of the nailbed noted     Assessment:  Ingrown toenail deformity with pain left hallux with no indication of infection     Plan:  H&P reviewed condition discussed correction patient wants this fixed permanently.  I explained procedure risk and patient wants surgery and today I infiltrated the left hallux 60 mg like Marcaine mixture sterile prep applied and using sterile instrumentation remove lateral border exposed matrix and applied phenol 3 applications 30 seconds followed by alcohol lavage sterile dressing.  Gave instructions on soaks and reappoint and instructed what to do the dressings to start to throb and wrote prescription for drops and encouraged to call with questions

## 2018-07-07 ENCOUNTER — Encounter: Payer: Self-pay | Admitting: Podiatry

## 2018-07-07 ENCOUNTER — Other Ambulatory Visit: Payer: Self-pay

## 2018-07-07 ENCOUNTER — Telehealth: Payer: Self-pay | Admitting: *Deleted

## 2018-07-07 ENCOUNTER — Ambulatory Visit (INDEPENDENT_AMBULATORY_CARE_PROVIDER_SITE_OTHER): Payer: Medicare Other | Admitting: Podiatry

## 2018-07-07 VITALS — Temp 99.3°F

## 2018-07-07 DIAGNOSIS — L03032 Cellulitis of left toe: Secondary | ICD-10-CM

## 2018-07-07 DIAGNOSIS — I6523 Occlusion and stenosis of bilateral carotid arteries: Secondary | ICD-10-CM | POA: Diagnosis not present

## 2018-07-07 MED ORDER — DOXYCYCLINE HYCLATE 100 MG PO TABS: 100.0000 mg | ORAL_TABLET | Freq: Two times a day (BID) | ORAL | 0 refills | Status: DC

## 2018-07-07 NOTE — Telephone Encounter (Signed)
I reviewed the photo from sent to my email and I see an area that does appear to have something in it. I called pt an offered an appt, pt accepted and I transferred to schedulers.

## 2018-07-07 NOTE — Progress Notes (Signed)
Subjective:   Patient ID: Luke [name], male   DOB: 74 y.o.   MRN: [id number]   HPI Patient presents stating his nail feels infected and he wanted to get it checked   ROS      Objective:  Physical Exam  Neurovascular status intact with left hallux lateral border showing some redness that is localized with no proximal edema erythema and a small amount of blistering in the proximal nail fold     Assessment:  Possibility for paronychia infection of the left hallux     Plan:  H&P condition reviewed and I have recommended a conservative treatment consisting of cleaning the area which I did today with sterile instrumentation and then I did place him on doxycycline for a week gave him strict instructions any increased redness drainage were to occur to let me know

## 2018-07-07 NOTE — Telephone Encounter (Signed)
Pt called states he would like to discuss with the nurse if he needs an appt to evaluate his toe after an ingrown toenail procedure 11 days ago.

## 2018-07-07 NOTE — Telephone Encounter (Signed)
Pt states he was unable to send the photo to my email and request a call back. I gave my email address again to pt. I did receive the photo.

## 2018-07-07 NOTE — Telephone Encounter (Signed)
I called pt, he states is having some problems, he states at the base he has redness the side of the nail the size of the dime and may have something in it, pt states the redness and swelling has increased and there is a lot of swelling. Pt states the soaks and drops do no appear to be helping and he switched to H202 and neosporin. Pt states he can take a picture and send to my email, I told pt that would be good.

## 2018-07-16 ENCOUNTER — Telehealth: Payer: Self-pay | Admitting: Cardiology

## 2018-07-16 NOTE — Telephone Encounter (Signed)
I do not see a recent clearance request.  In notes, it appears he had an upcoming biopsy as well as tooth extraction.   Per Dr. Rosezella Florida note on 05/16/2018: Dr. Percival Spanish wanted to see the patient back in 3 months prior to dental extraction.   Please call back and enter a request so that I can get him formally cleared.    Thank you

## 2018-07-16 NOTE — Telephone Encounter (Signed)
Called Dr. Landry Corporal office back re: surgical clearance.  They are closed for lunch from 12-2.  Will call back later.

## 2018-07-16 NOTE — Telephone Encounter (Signed)
Follow Up:   Mickel Baas from Dr [facility] office j checking on the status of pt's clearance from 05-12-18. Please fax this to [phone].

## 2018-07-17 NOTE — Telephone Encounter (Signed)
Per review of notes, pt has an appt with Dr. Percival Spanish on 6/11 to revisit clearance for tooth extraction. The procedure should be deferred until then.   (Of note per our office protocol:  Simple dental extractions are considered low risk procedures per guidelines and generally do not require any specific cardiac clearance. It is also generally accepted that for simple extractions and dental cleanings, there is no need to interrupt blood thinner therapy.)  I will route this recommendation to the requesting party via [place] fax function and remove from pre-op pool.  Please call with questions.  Daune Perch, NP 07/17/2018, 1:19 PM

## 2018-07-17 NOTE — Telephone Encounter (Signed)
Spoke with Mickel Baas from requesting office. I told her that we did not receive a recent clearance. I told her that I could manual enter the clearance and she stated that they prefer to have the actual clearance formed filled out and faxed back to their office. She stated that she will refax the form to our office for [place] to fill out.

## 2018-07-23 ENCOUNTER — Ambulatory Visit (INDEPENDENT_AMBULATORY_CARE_PROVIDER_SITE_OTHER): Payer: Medicare Other | Admitting: Podiatry

## 2018-07-23 ENCOUNTER — Encounter: Payer: Self-pay | Admitting: Podiatry

## 2018-07-23 ENCOUNTER — Other Ambulatory Visit: Payer: Self-pay

## 2018-07-23 VITALS — Temp 97.7°F

## 2018-07-23 DIAGNOSIS — I6523 Occlusion and stenosis of bilateral carotid arteries: Secondary | ICD-10-CM

## 2018-07-23 DIAGNOSIS — L03032 Cellulitis of left toe: Secondary | ICD-10-CM

## 2018-07-23 MED ORDER — CEPHALEXIN 500 MG PO CAPS: 500.0000 mg | ORAL_CAPSULE | Freq: Three times a day (TID) | ORAL | 0 refills | Status: DC

## 2018-07-23 NOTE — Patient Instructions (Signed)

## 2018-07-24 NOTE — Progress Notes (Signed)
Subjective:   Patient ID: Luke [name], male   DOB: 73 y.o.   MRN: [id number]   HPI Patient states his left big toe has been bruising and he admits that he did not take the antibiotic that I had put him on as he stated that the pill seemed to upset his stomach and he just did not take it.  Patient states it is been staying in this area but he is just concerned about   ROS      Objective:  Physical Exam  Neurovascular status unchanged with patient on the lateral side of the left hallux having some redness around the site that we did do the chemical application with crusted tissue formation and no proximal edema erythema noted currently.  It is painful when pressed and there is some drainage to the area     Assessment:  Paronychia infection left hallux with patient being noncompliant but not taking the antibiotic or informing us that he did not take the antibiotic at last visit     Plan:  H&P and reviewed the importance of taking antibiotics as indicated.  Today I did go ahead and I infiltrated the left hallux 60 mg like Marcaine mixture and using sterile instrumentation I opened up the lateral side of the nailbed and removed crusted and necrotic tissue from the area and did do a culture of this area.  I did not note any purulent pus formation and I did go ahead and flush the area I did not note any bone exposure currently and did change his antibiotic to cephalexin 500 mg 3 times daily and told him the importance of taking this.  Patient will let us know if any issues were to occur we will get the results of his culture and this should heal uneventfully but he is given strict instructions to let us know if any issues or nonhealing were to occur

## 2018-07-25 ENCOUNTER — Telehealth: Payer: Self-pay | Admitting: Cardiology

## 2018-07-25 NOTE — Telephone Encounter (Signed)
Spoke with pt who state about 5 days ago he woke up and had numbness in his left hand but eventually subsided that same day. He also report last night numbness in his cheek that only lasted for about 7-8 sec. He denies facial droop, weakness in extremity, and report he is not having current symptoms. Pt state he is just concerned and would like to make Dr. Warren Lacy aware.   Will route to MD

## 2018-07-25 NOTE — Telephone Encounter (Signed)
New Message            Patient is experiencing some numbness in his left hand, he states it went away and then this AM he had some numbness in his left cheek. Patient is concerned and would like a call back concerning this matter.

## 2018-07-26 NOTE — Telephone Encounter (Signed)
Patient has an appt with me next week.

## 2018-07-27 LAB — WOUND CULTURE
MICRO NUMBER:: 491974
SPECIMEN QUALITY:: ADEQUATE

## 2018-07-28 NOTE — Progress Notes (Signed)
Virtual Visit via Video Note   This visit type was conducted due to national recommendations for restrictions regarding the COVID-19 Pandemic (e.g. social distancing) in an effort to limit this patient's exposure and mitigate transmission in our community.  Due to his co-morbid illnesses, this patient is at least at moderate risk for complications without adequate follow up.  This format is felt to be most appropriate for this patient at this time.  All issues noted in this document were discussed and addressed.  A limited physical exam was performed with this format.  Please refer to the patient's chart for his consent to telehealth for [facility].   Date:  07/29/2018   ID:  Luke [name], Luke [name] [birth year], MRN [id number]  Patient Location: Home Provider Location: Home  PCP:  Laurey Morale, MD  Cardiologist:  Minus Breeding, MD  Electrophysiologist:  None   Evaluation Performed:  Follow-Up Visit  Chief Complaint:  Hand numbness  History of Present Illness:    Luke [name] is a 73 y.o. male who presents for follow up of atrial fib.    He was found to have atrial fib in the context of a URI.  He had a negative ischemia work up and unremarkable echo  He did not want to continue with the [place].   He was in the hospital with a CVA.  I reviewed these records.  This appeared to be embolic.  He does have a history of atrial fibrillation and had not wanted to take Eliquis previously.  However, he is now on this.  He also had carotid stenosis as described below.    He called recently with hand numbness.  This was a left hand numbness.  He woke up with it.  It lasted for about 2 hours slowly getting better.  He just felt like his hand was a little cumbersome.  He had tingling in it.  This went away.  He subsequently a few days later had 10 seconds of right facial numbness in his cheek.  He did not have any blurring of his vision.  He did have a change in his speech.  This was not like his  previous CVA.  He has not noticed any change in palpitations.  Is had no presyncope or syncope.  Has had no chest pressure, neck or arm discomfort.  He is had no weight gain or edema.   The patient does not have symptoms concerning for COVID-19 infection (fever, chills, cough, or new shortness of breath).    Past Medical History:  Diagnosis Date  . Asthma   . Depression   . ED (erectile dysfunction)   . Hyperlipidemia   . Hypertension   . Paroxysmal atrial fibrillation ([place]) 09/2016   Initial diagnosis was in setting of acute URI/asthma attack  . Solitary pulmonary nodule 04/16/2013   03/30/13  CXR Question left nipple shadow.  10 mm ovoid nodular density right upper lobe, cannot exclude  pulmonary mass/ nodule > f/u cxr 04/29/2013 > no nodule    . Stroke ([place])   . Ulcer    Past Surgical History:  Procedure Laterality Date  . COLONOSCOPY  04/27/2014   per [facility], repeat in 5 yrs   . NM MYOVIEW LTD  10/2016   ailable, images/films reviewed: From Epic Chart or Care Everywhere)  . TRANSTHORACIC ECHOCARDIOGRAM  08/2692   Normal systolic function. EF 5560%. No RWMA. Gr 1 DD. Mild AoV calcification - no stenosis. Atrial Septum - lipomatous hypertrophy. --  Essentially normal.    Prior to Admission medications   Medication Sig Start Date End Date Taking? Authorizing Provider  acyclovir (ZOVIRAX) 200 MG capsule TAKE 1 CAPSULE DAILY 02/24/18  Yes Laurey Morale, MD  albuterol (PROVENTIL HFA;VENTOLIN HFA) 108 (90 Base) MCG/ACT inhaler Inhale 2 puffs into the lungs every 6 (six) hours as needed for wheezing or shortness of breath.   Yes [provider]  amLODipine (NORVASC) 10 MG tablet Take 1 tablet (10 mg total) by mouth daily. 02/24/18  Yes Laurey Morale, MD  apixaban (ELIQUIS) 5 MG TABS tablet Take 1 tablet (5 mg total) by mouth 2 (two) times daily for 30 days. 04/21/18 07/29/18 Yes Amin, Ankit Chirag, MD  atorvastatin (LIPITOR) 10 MG tablet Take 1 tablet (10 mg total) by mouth at  bedtime. 02/24/18  Yes Laurey Morale, MD  carvedilol (COREG) 6.25 MG tablet Take 1 tablet (6.25 mg total) by mouth 2 (two) times daily with a meal. 02/24/18  Yes Laurey Morale, MD  cephALEXin (KEFLEX) 500 MG capsule Take 1 capsule (500 mg total) by mouth 3 (three) times daily. 07/23/18  Yes Regal, Tamala Fothergill, DPM  doxycycline (VIBRA-TABS) 100 MG tablet Take 1 tablet (100 mg total) by mouth 2 (two) times daily. 07/07/18  Yes Regal, Tamala Fothergill, DPM  losartan (COZAAR) 100 MG tablet Take 1 tablet (100 mg total) by mouth daily. 02/24/18  Yes Laurey Morale, MD  metaxalone (SKELAXIN) 800 MG tablet Take 1 tablet (800 mg total) by mouth 4 (four) times daily. Patient taking differently: Take 800 mg by mouth 2 (two) times daily as needed for muscle spasms.  02/21/17  Yes Laurey Morale, MD  neomycin-polymyxin-hydrocortisone (CORTISPORIN) OTIC solution Apply 1-2 drops to toe after soaking twice a day 06/26/18  Yes Regal, Tamala Fothergill, DPM  omeprazole (PRILOSEC) 20 MG capsule Take 1 capsule (20 mg total) by mouth daily. 02/24/18  Yes Laurey Morale, MD  sildenafil (VIAGRA) 50 MG tablet Take 1 tablet (50 mg total) by mouth as needed. Patient taking differently: Take 50 mg by mouth as needed for erectile dysfunction.  10/24/17  Yes Laurey Morale, MD  triamcinolone cream (KENALOG) 0.1 % Apply 1 application topically 2 (two) times daily. Patient taking differently: Apply 1 application topically 2 (two) times daily as needed (rash).  02/24/18  Yes Laurey Morale, MD     Allergies:   Aspirin; Hydrochlorothiazide; Irbesartan; and Symbicort [budesonide-formoterol fumarate]   Social History   Tobacco Use  . Smoking status: Never Smoker  . Smokeless tobacco: Never Used  Substance Use Topics  . Alcohol use: Yes    Alcohol/week: 2.0 standard drinks    Types: 2 Standard drinks or equivalent per week    Comment: socially occasionally   . Drug use: No     Family Hx: The patient's family history includes Cancer in his  brother; Coronary artery disease in his brother and father; Emphysema in his brother; Hyperlipidemia in his father; Hypertension in his brother and mother; Lung cancer in his brother; Peripheral vascular disease in his father and mother; Stroke in his mother.  ROS:   Please see the history of present illness.    As stated in the HPI and negative for all other systems.   Prior CV studies:   The following studies were reviewed today:   Labs/Other Tests and Data Reviewed:    EKG:  No ECG reviewed.  Recent Labs: 02/24/2018: TSH 1.84 04/19/2018: ALT 14; BUN 15; Creatinine, Ser  1.16; Hemoglobin 13.8; Platelets 322; Potassium 4.2; Sodium 140   Recent Lipid Panel Lab Results  Component Value Date/Time   CHOL 133 04/20/2018 07:18 AM   TRIG 230 (H) 04/20/2018 07:18 AM   HDL 24 (L) 04/20/2018 07:18 AM   CHOLHDL 5.5 04/20/2018 07:18 AM   LDLCALC 63 04/20/2018 07:18 AM   LDLDIRECT 73.0 02/08/2015 09:06 AM    Wt Readings from Last 3 Encounters:  07/29/18 185 lb (83.9 kg)  06/24/18 186 lb (84.4 kg)  05/20/18 189 lb 2 oz (85.8 kg)     Objective:    Vital Signs:  BP 119/70   Pulse (!) 57   Ht 5\' 7"  (1.702 m)   Wt 185 lb (83.9 kg)   BMI 28.98 kg/m    VITAL SIGNS:  reviewed GEN:  no acute distress EYES:  sclerae anicteric, EOMI - Extraocular Movements Intact NEURO:  alert and oriented x 3, no obvious focal deficit PSYCH:  normal affect  ASSESSMENT & PLAN:    ATRIAL FIB:  Luke [name] has a CHA2DS2 - VASc score of  4.  He is now taking his Eliquis.  No change in therapy.   HTN: The blood pressure is at target.  No change in therapy.   CAROTID DOPPLER: 60 to 79% right carotid stenosis and 40 to 59% left stenosis.  He has an appt upcoming with Dr. Carlis Abbott  DYSLIPIDEMIA: LDL is at target.  No change in therapy.   THYROID BIOPSY:    He needs to come off of his Eliquis for 2 days for this and I think this is reasonable.  He will resume as soon as possible afterwards.   HAND NUMBNESS: I think this is unlikely to be related either to his fibrillation or his carotid stenosis.  We discussed this at length.  He is going have a carotid follow-up as above.  At this point given this was an isolated episode without ongoing symptoms no change in therapy or further evaluation is indicated.  COVID-19 Education: The signs and symptoms of COVID-19 were discussed with the patient and how to seek care for testing (follow up with PCP or arrange E-visit).  The importance of social distancing was discussed today.  Time:   Today, I have spent 20 minutes with the patient with telehealth technology discussing the above problems.     Medication Adjustments/Labs and Tests Ordered: Current medicines are reviewed at length with the patient today.  Concerns regarding medicines are outlined above.   Tests Ordered: No orders of the defined types were placed in this encounter.   Medication Changes: No orders of the defined types were placed in this encounter.   Disposition:  Follow up with me in six months  Signed, Minus Breeding, MD  07/29/2018 12:25 PM    [place]

## 2018-07-29 ENCOUNTER — Encounter: Payer: Self-pay | Admitting: Cardiology

## 2018-07-29 ENCOUNTER — Telehealth (INDEPENDENT_AMBULATORY_CARE_PROVIDER_SITE_OTHER): Payer: Medicare Other | Admitting: Cardiology

## 2018-07-29 VITALS — BP 119/70 | HR 57 | Ht 67.0 in | Wt 185.0 lb

## 2018-07-29 DIAGNOSIS — I6340 Cerebral infarction due to embolism of unspecified cerebral artery: Secondary | ICD-10-CM

## 2018-07-29 DIAGNOSIS — I480 Paroxysmal atrial fibrillation: Secondary | ICD-10-CM

## 2018-07-29 DIAGNOSIS — R200 Anesthesia of skin: Secondary | ICD-10-CM

## 2018-07-29 DIAGNOSIS — I6523 Occlusion and stenosis of bilateral carotid arteries: Secondary | ICD-10-CM

## 2018-07-29 DIAGNOSIS — Z7189 Other specified counseling: Secondary | ICD-10-CM

## 2018-07-29 DIAGNOSIS — E785 Hyperlipidemia, unspecified: Secondary | ICD-10-CM

## 2018-07-29 NOTE — Telephone Encounter (Signed)
Pt had virtual visit with Dr Percival Spanish today

## 2018-07-29 NOTE — Patient Instructions (Signed)

## 2018-07-31 ENCOUNTER — Encounter: Payer: Self-pay | Admitting: Podiatry

## 2018-08-05 ENCOUNTER — Other Ambulatory Visit: Payer: Self-pay

## 2018-08-05 DIAGNOSIS — I6523 Occlusion and stenosis of bilateral carotid arteries: Secondary | ICD-10-CM

## 2018-08-11 ENCOUNTER — Telehealth (HOSPITAL_COMMUNITY): Payer: Self-pay | Admitting: Rehabilitation

## 2018-08-11 NOTE — Telephone Encounter (Signed)

## 2018-08-12 ENCOUNTER — Other Ambulatory Visit: Payer: Self-pay

## 2018-08-12 ENCOUNTER — Ambulatory Visit (HOSPITAL_COMMUNITY)
Admission: RE | Admit: 2018-08-12 | Discharge: 2018-08-12 | Disposition: A | Discharge status: Home / Self Care | Payer: Medicare Other | Source: Ambulatory Visit | Attending: Family | Admitting: Family

## 2018-08-12 ENCOUNTER — Encounter: Payer: Self-pay | Admitting: Vascular Surgery

## 2018-08-12 ENCOUNTER — Ambulatory Visit (INDEPENDENT_AMBULATORY_CARE_PROVIDER_SITE_OTHER): Payer: Medicare Other | Admitting: Vascular Surgery

## 2018-08-12 VITALS — BP 133/79 | HR 56 | Temp 98.8°F | Ht 67.0 in | Wt 187.6 lb

## 2018-08-12 DIAGNOSIS — I6523 Occlusion and stenosis of bilateral carotid arteries: Secondary | ICD-10-CM | POA: Diagnosis not present

## 2018-08-12 NOTE — Progress Notes (Signed)
Patient name: Luke [name] MRN: [id number] DOB: 09/13/1945 Sex: male  REASON FOR CONSULT: Evaluate for carotid stenosis  HPI: Luke [name] is a 73 y.o. male, with history of atrial fibrillation now on Eliquis, hypertension, hyperlipidemia that presents for evaluation of his carotid disease.  Patient was initially hospitalized back in February 2020 with evidence of a right brain infarct.  He states he remembers developing left arm numbness in February around the time of the event.  At that time he was not on anticoagulation for his atrial fibrillation and had previously refused anticoagulation.  He was then started on Eliquis has been on Eliquis since that time.  CT neck in February showed approximately 60% bilateral ICA stenosis.  There was no evidence of large vessel occlusion.  Duplex at that time showed a 60 to 79% right ICA stenosis and 40 to 59% left ICA stenosis.  Patient reports recent event of left arm numbness about 6 weeks ago while on anticoagulation at this time.  He states he thought he just slept on his arm funny.  He also describes a remote event of some right facial numbness within the last month as well.  No previous neck surgery or neck radiation.     Past Medical History:  Diagnosis Date  . Asthma   . Depression   . ED (erectile dysfunction)   . Hyperlipidemia   . Hypertension   . Paroxysmal atrial fibrillation ([place]) 09/2016   Initial diagnosis was in setting of acute URI/asthma attack  . Solitary pulmonary nodule 04/16/2013   03/30/13  CXR Question left nipple shadow.  10 mm ovoid nodular density right upper lobe, cannot exclude  pulmonary mass/ nodule > f/u cxr 04/29/2013 > no nodule    . Stroke ([place])   . Stroke ([place])   . Ulcer     Past Surgical History:  Procedure Laterality Date  . COLONOSCOPY  04/27/2014   per [facility], repeat in 5 yrs   . NM MYOVIEW LTD  10/2016   ailable, images/films reviewed: From Epic Chart or Care Everywhere)  . TRANSTHORACIC  ECHOCARDIOGRAM  11/2328   Normal systolic function. EF 5560%. No RWMA. Gr 1 DD. Mild AoV calcification - no stenosis. Atrial Septum - lipomatous hypertrophy. --Essentially normal.    Family History  Problem Relation Age of Onset  . Stroke Mother   . Hypertension Mother   . Peripheral vascular disease Mother   . Hyperlipidemia Father   . Coronary artery disease Father   . Peripheral vascular disease Father   . Hypertension Brother   . Coronary artery disease Brother   . Cancer Brother        throat, smoked  . Emphysema Brother        smoked  . Lung cancer Brother        smoked    SOCIAL HISTORY: Social History   Socioeconomic History  . Marital status: Married    Spouse name: Not on file  . Number of children: Not on file  . Years of education: Not on file  . Highest education level: Not on file  Occupational History  . Occupation: Retired Product manager: RETIRED  Social Needs  . Financial resource strain: Not on file  . Food insecurity:    Worry: Not on file    Inability: Not on file  . Transportation needs:    Medical: Not on file    Non-medical: Not on file  Tobacco Use  . Smoking  status: Never Smoker  . Smokeless tobacco: Never Used  Substance and Sexual Activity  . Alcohol use: Yes    Alcohol/week: 2.0 standard drinks    Types: 2 Standard drinks or equivalent per week    Comment: socially occasionally   . Drug use: No  . Sexual activity: Not on file  Lifestyle  . Physical activity:    Days per week: Not on file    Minutes per session: Not on file  . Stress: Not on file  Relationships  . Social connections:    Talks on phone: Not on file    Gets together: Not on file    Attends religious service: Not on file    Active member of club or organization: Not on file    Attends meetings of clubs or organizations: Not on file    Relationship status: Not on file  . Intimate partner violence:    Fear of current or ex partner: Not on file     Emotionally abused: Not on file    Physically abused: Not on file    Forced sexual activity: Not on file  Other Topics Concern  . Not on file  Social History Narrative  . Not on file    Allergies  Allergen Reactions  . Aspirin Other (See Comments)    REACTION: ULCER @LARGE  DOSES  . Hydrochlorothiazide Other (See Comments)    Leg cramps  . Irbesartan Other (See Comments)    Headaches   . Symbicort [Budesonide-Formoterol Fumarate] Other (See Comments)    legcramps    Current Outpatient Medications  Medication Sig Dispense Refill  . acyclovir (ZOVIRAX) 200 MG capsule TAKE 1 CAPSULE DAILY 90 capsule 3  . albuterol (PROVENTIL HFA;VENTOLIN HFA) 108 (90 Base) MCG/ACT inhaler Inhale 2 puffs into the lungs every 6 (six) hours as needed for wheezing or shortness of breath.    Marland Kitchen amLODipine (NORVASC) 10 MG tablet Take 1 tablet (10 mg total) by mouth daily. 90 tablet 3  . atorvastatin (LIPITOR) 10 MG tablet Take 1 tablet (10 mg total) by mouth at bedtime. 90 tablet 3  . carvedilol (COREG) 6.25 MG tablet Take 1 tablet (6.25 mg total) by mouth 2 (two) times daily with a meal. 180 tablet 3  . losartan (COZAAR) 100 MG tablet Take 1 tablet (100 mg total) by mouth daily. 90 tablet 3  . metaxalone (SKELAXIN) 800 MG tablet Take 1 tablet (800 mg total) by mouth 4 (four) times daily. (Patient taking differently: Take 800 mg by mouth 2 (two) times daily as needed for muscle spasms. ) 60 tablet 2  . neomycin-polymyxin-hydrocortisone (CORTISPORIN) OTIC solution Apply 1-2 drops to toe after soaking twice a day 10 mL 0  . omeprazole (PRILOSEC) 20 MG capsule Take 1 capsule (20 mg total) by mouth daily. 90 capsule 3  . sildenafil (VIAGRA) 50 MG tablet Take 1 tablet (50 mg total) by mouth as needed. (Patient taking differently: Take 50 mg by mouth as needed for erectile dysfunction. ) 10 tablet 11  . triamcinolone cream (KENALOG) 0.1 % Apply 1 application topically 2 (two) times daily. (Patient taking  differently: Apply 1 application topically 2 (two) times daily as needed (rash). ) 45 g 2  . apixaban (ELIQUIS) 5 MG TABS tablet Take 1 tablet (5 mg total) by mouth 2 (two) times daily for 30 days. 60 tablet 0  . cephALEXin (KEFLEX) 500 MG capsule Take 1 capsule (500 mg total) by mouth 3 (three) times daily. (Patient not taking: Reported on  08/12/2018) 30 capsule 0  . doxycycline (VIBRA-TABS) 100 MG tablet Take 1 tablet (100 mg total) by mouth 2 (two) times daily. (Patient not taking: Reported on 08/12/2018) 15 tablet 0   No current facility-administered medications for this visit.     REVIEW OF SYSTEMS:  [X]  denotes positive finding, [ ]  denotes negative finding Cardiac  Comments:  Chest pain or chest pressure:    Shortness of breath upon exertion:    Short of breath when lying flat:    Irregular heart rhythm:        Vascular    Pain in calf, thigh, or hip brought on by ambulation:    Pain in feet at night that wakes you up from your sleep:     Blood clot in your veins:    Leg swelling:         Pulmonary    Oxygen at home:    Productive cough:     Wheezing:         Neurologic    Sudden weakness in arms or legs:     Sudden numbness in arms or legs:     Sudden onset of difficulty speaking or slurred speech:    Temporary loss of vision in one eye:     Problems with dizziness:         Gastrointestinal    Blood in stool:     Vomited blood:         Genitourinary    Burning when urinating:     Blood in urine:        Psychiatric    Major depression:         Hematologic    Bleeding problems:    Problems with blood clotting too easily:        Skin    Rashes or ulcers:        Constitutional    Fever or chills:      PHYSICAL EXAM: Vitals:   08/12/18 1348  BP: 133/79  Pulse: (!) 56  Temp: 98.8 F (37.1 C)  TempSrc: Temporal  SpO2: 97%  Weight: 187 lb 9.6 oz (85.1 kg)  Height: 5\' 7"  (1.702 m)    GENERAL: The patient is a well-nourished male, in no acute distress.  The vital signs are documented above. CARDIAC: Irregularly irregular rhythm VASCULAR:  Palpable carotid pulse bilaterally PULMONARY: There is good air exchange bilaterally without wheezing or rales. ABDOMEN: Soft and non-tender with normal pitched bowel sounds.  MUSCULOSKELETAL: There are no major deformities or cyanosis. NEUROLOGIC: No focal weakness or paresthesias are detected.  Cn II-XII grossly intact. SKIN: There are no ulcers or rashes noted. PSYCHIATRIC: The patient has a normal affect.  DATA:   MRI brain 04/19/2018: Early to subacute infarcts in the right parietal and posterior frontal lobes  Carotid duplex 04/20/2018: 60-79% right ICA stenosis velocity 243/63; left ICA stenosis of 40 to 59% with velocity 163/48  CTA neck: 60% bilateral ICA stenosis.   Assessment/Plan:  73 year old male the presents for evaluation of carotid stenosis.  I discussed with him in detail that in reviewing the notes it looks like his initial right brain stroke was attributed to atrial fibrillation while off anticoagulation (also had some left eye vision loss on presentation as well).  However, in the last 6 weeks he describes one episode of left arm numbness and right face numbness while on anticoagulation for his atrial fibrillation.  In reviewing the CT scan I think he does have at least a  60 to 70% right ICA stenosis and I am concerned that this represents potential symptomatic lesion given some of the neurologic events he describes even while on anticoagulation for his A. fib.  I subsequently recommended a right carotid endarterectomy.  Ultimately after discussing the surgery in detail as well as risks and benefits patient is very hesitant to proceed.  He states he needs more time to think about his options and feels that since he has had no symptoms over the last month he probably does not need surgery.  I advised that he discuss his symptoms with Dr. Leonie Man in more detail next week since he has an  additional followup to get another opinion from his neurologist.  Discussed without intervention may be placing himself at high risk for recurrent event.  I will schedule him to come back in 6 months for ongoing surveillance since he doesn't want surgery, but discussed if he changes his mind about surgery to contact our office and we be happy to schedule him.     Marty Heck, MD [facility] Office: [phone] Pager: [phone]

## 2018-08-14 ENCOUNTER — Ambulatory Visit: Payer: Medicare Other | Admitting: Cardiology

## 2018-08-20 ENCOUNTER — Telehealth: Payer: Self-pay | Admitting: Cardiology

## 2018-08-20 ENCOUNTER — Ambulatory Visit
Admission: RE | Admit: 2018-08-20 | Discharge: 2018-08-20 | Disposition: A | Discharge status: Home / Self Care | Payer: Medicare Other | Source: Ambulatory Visit | Attending: Family Medicine | Admitting: Family Medicine

## 2018-08-20 ENCOUNTER — Other Ambulatory Visit: Payer: Medicare Other

## 2018-08-20 ENCOUNTER — Other Ambulatory Visit (HOSPITAL_COMMUNITY)
Admission: RE | Admit: 2018-08-20 | Discharge: 2018-08-20 | Disposition: A | Discharge status: Home / Self Care | Payer: Medicare Other | Source: Ambulatory Visit | Attending: Radiology | Admitting: Radiology

## 2018-08-20 DIAGNOSIS — E042 Nontoxic multinodular goiter: Secondary | ICD-10-CM | POA: Diagnosis not present

## 2018-08-20 DIAGNOSIS — E041 Nontoxic single thyroid nodule: Secondary | ICD-10-CM | POA: Diagnosis not present

## 2018-08-20 NOTE — Telephone Encounter (Signed)
New message   Patient would like to discuss surgery on the right carotid artery with Dr. Percival Spanish. Please call.

## 2018-08-21 ENCOUNTER — Ambulatory Visit: Payer: Self-pay

## 2018-08-21 DIAGNOSIS — R42 Dizziness and giddiness: Secondary | ICD-10-CM | POA: Diagnosis not present

## 2018-08-21 DIAGNOSIS — R110 Nausea: Secondary | ICD-10-CM | POA: Diagnosis not present

## 2018-08-21 DIAGNOSIS — R1111 Vomiting without nausea: Secondary | ICD-10-CM | POA: Diagnosis not present

## 2018-08-21 NOTE — Telephone Encounter (Signed)
Pt's wife advised that with current symptoms and hx of recent stroke pt needs to be evaluated in ED. She agreed. Will monitor for ED arrival.

## 2018-08-21 NOTE — Telephone Encounter (Signed)
Patient's wife called and says about 30-45 minutes ago, he became real dizzy and lightheaded with the room spinning. She says he vomited twice and is nauseated when he moves. She says he has to walk holding on to keep from falling and it's worse with any head movement. She asked if he's having numbness, tingling, headache, he says no. I called the office and spoke to Tammy, [facility] who asked to speak to the patient's wife, the call was connected successfully.   Answer Assessment - Initial Assessment Questions 1. DESCRIPTION: "Describe your dizziness."     Room spinning, lightheaded 2. VERTIGO: "Do you feel like either you or the room is spinning or tilting?"      Yes 3. LIGHTHEADED: "Do you feel lightheaded?" (e.g., somewhat faint, woozy, weak upon standing)     Yes 4. SEVERITY: "How bad is it?"  "Can you walk?"   - MILD - Feels unsteady but walking normally.   - MODERATE - Feels very unsteady when walking, but not falling; interferes with normal activities (e.g., school, work) .   - SEVERE - Unable to walk without falling (requires assistance).     Moderate 5. ONSET:  "When did the dizziness begin?"     Sudden onset about 30-45 minutes ago 6. AGGRAVATING FACTORS: "Does anything make it worse?" (e.g., standing, change in head position)     Leaning back when sitting, changing head position 7. CAUSE: "What do you think is causing the dizziness?"     I don't know 8. RECURRENT SYMPTOM: "Have you had dizziness before?" If so, ask: "When was the last time?" "What happened that time?"     Vertigo, not this bad 9. OTHER SYMPTOMS: "Do you have any other symptoms?" (e.g., headache, weakness, numbness, vomiting, earache)     Nausea, vomiting twice 10. PREGNANCY: "Is there any chance you are pregnant?" "When was your last menstrual period?"      N/A  Protocols used: DIZZINESS - VERTIGO-A-AH

## 2018-08-22 ENCOUNTER — Ambulatory Visit: Payer: Medicare Other | Admitting: Adult Health

## 2018-08-22 ENCOUNTER — Encounter: Payer: Self-pay | Admitting: *Deleted

## 2018-08-22 NOTE — Telephone Encounter (Signed)
Dr. Sarajane Jews please advise if you want to see the pt for this?  Virtual vs in office.

## 2018-08-22 NOTE — Telephone Encounter (Signed)
Called and spoke with pts wife and she said they did call EMS and his vitals were checked---she stated that when they did the biopsy they had his head in a weird position and they feel that this is what caused the vertigo.  They will continue to monitor but the pt is doing much better now.

## 2018-08-22 NOTE — Telephone Encounter (Signed)
He needs to go to the ED asap

## 2018-08-22 NOTE — Telephone Encounter (Signed)
Per chart pt did not seek care at ED as advised.

## 2018-09-22 NOTE — Telephone Encounter (Signed)
I did have a conversation with the patient.  He has further questions that are best addressed by [facility].

## 2018-09-23 ENCOUNTER — Ambulatory Visit: Payer: Self-pay | Admitting: Adult Health

## 2018-09-25 ENCOUNTER — Ambulatory Visit: Payer: Self-pay | Admitting: Adult Health

## 2018-09-25 DIAGNOSIS — L700 Acne vulgaris: Secondary | ICD-10-CM | POA: Diagnosis not present

## 2018-09-25 DIAGNOSIS — L720 Epidermal cyst: Secondary | ICD-10-CM | POA: Diagnosis not present

## 2018-09-29 ENCOUNTER — Telehealth: Payer: Self-pay

## 2018-09-29 NOTE — Telephone Encounter (Signed)
Spoke with DOD Dr.Harding he advised ok for patient to have tooth pulled.Advised ok to hold Eliquis 1 day prior to extraction.Note faxed to Dr.Mohorn at fax # [phone].

## 2018-11-11 ENCOUNTER — Ambulatory Visit (INDEPENDENT_AMBULATORY_CARE_PROVIDER_SITE_OTHER): Payer: Medicare Other

## 2018-11-11 ENCOUNTER — Other Ambulatory Visit: Payer: Self-pay

## 2018-11-11 DIAGNOSIS — Z23 Encounter for immunization: Secondary | ICD-10-CM

## 2018-11-11 NOTE — Progress Notes (Signed)
Patient in for flu vaccine. Vaccine administered with no reactions

## 2018-11-14 ENCOUNTER — Ambulatory Visit (INDEPENDENT_AMBULATORY_CARE_PROVIDER_SITE_OTHER): Payer: Medicare Other | Admitting: Family Medicine

## 2018-11-14 ENCOUNTER — Encounter: Payer: Self-pay | Admitting: Family Medicine

## 2018-11-14 VITALS — BP 130/60 | HR 59 | Temp 98.3°F | Wt 183.8 lb

## 2018-11-14 DIAGNOSIS — S161XXA Strain of muscle, fascia and tendon at neck level, initial encounter: Secondary | ICD-10-CM

## 2018-11-14 DIAGNOSIS — I6523 Occlusion and stenosis of bilateral carotid arteries: Secondary | ICD-10-CM | POA: Diagnosis not present

## 2018-11-14 MED ORDER — CYCLOBENZAPRINE HCL 10 MG PO TABS: 10.0000 mg | ORAL_TABLET | Freq: Three times a day (TID) | ORAL | 0 refills | Status: DC | PRN

## 2018-11-14 MED ORDER — METHYLPREDNISOLONE 4 MG PO TBPK: ORAL_TABLET | ORAL | 0 refills | Status: DC

## 2018-11-14 NOTE — Progress Notes (Signed)
   Subjective:    Patient ID: [place] JANK, male    DOB: July 03, 1945, 73 y.o.   MRN: [id number]  HPI Here for tightness and pain in the neck. He had not had this before. Two weeks ago at home while bending forward to eat some soup, he felt and heard a "pop" and he had a sudden sharp pain in the right upper neck at the base of the skull. Since then the pain has spread to the right jaw area and down the top of the right shoulder. He has tried ice and heat, along with Ibuprofen and Tylenol, but nothing has helped.    Review of Systems  Constitutional: Negative.   Respiratory: Negative.   Cardiovascular: Negative.   Musculoskeletal: Positive for neck pain and neck stiffness.       Objective:   Physical Exam Constitutional:      General: He is not in acute distress.    Appearance: Normal appearance.  Cardiovascular:     Rate and Rhythm: Normal rate and regular rhythm.     Pulses: Normal pulses.     Heart sounds: Normal heart sounds.  Pulmonary:     Effort: Pulmonary effort is normal.     Breath sounds: Normal breath sounds.  Musculoskeletal:     Comments: He is tender in the right upper neck at the base of the skull. ROM of the neck is very limited by pain  Neurological:     Mental Status: He is alert.           Assessment & Plan:  Neck strain. He will use heat, Flexeril, and a Medrol dose pack. Recheck prn.  [name], MD

## 2018-11-17 ENCOUNTER — Encounter: Payer: Self-pay | Admitting: Family Medicine

## 2018-12-11 ENCOUNTER — Other Ambulatory Visit: Payer: Self-pay | Admitting: Family Medicine

## 2018-12-11 MED ORDER — OMEPRAZOLE 20 MG PO CPDR: 20.0000 mg | DELAYED_RELEASE_CAPSULE | Freq: Every day | ORAL | 3 refills | Status: DC

## 2018-12-11 MED ORDER — CARVEDILOL 6.25 MG PO TABS: 6.2500 mg | ORAL_TABLET | Freq: Two times a day (BID) | ORAL | 3 refills | Status: DC

## 2018-12-11 MED ORDER — LOSARTAN POTASSIUM 100 MG PO TABS: 100.0000 mg | ORAL_TABLET | Freq: Every day | ORAL | 3 refills | Status: DC

## 2018-12-11 MED ORDER — AMLODIPINE BESYLATE 10 MG PO TABS: 10.0000 mg | ORAL_TABLET | Freq: Every day | ORAL | 3 refills | Status: DC

## 2018-12-11 NOTE — Telephone Encounter (Signed)
Medication Refill - Medication:carvedilol,amLODipine , losartan ,omeprazole   Preferred Pharmacy (with phone number or street name):  [facility], [place] [address] [place] [address]  Phone: [phone] Fax: [phone]  Not a 24 hour pharmacy; exact hours not known    Agent: Please be advised that RX refills may take up to 3 business days. We ask that you follow-up with your pharmacy.

## 2019-03-03 ENCOUNTER — Encounter: Payer: Medicare Other | Admitting: Family Medicine

## 2019-03-12 ENCOUNTER — Ambulatory Visit (INDEPENDENT_AMBULATORY_CARE_PROVIDER_SITE_OTHER): Payer: Medicare Other | Admitting: Family Medicine

## 2019-03-12 ENCOUNTER — Other Ambulatory Visit: Payer: Self-pay

## 2019-03-12 ENCOUNTER — Encounter: Payer: Self-pay | Admitting: Family Medicine

## 2019-03-12 VITALS — BP 130/62 | HR 64 | Temp 98.2°F | Ht 67.0 in | Wt 182.4 lb

## 2019-03-12 DIAGNOSIS — N401 Enlarged prostate with lower urinary tract symptoms: Secondary | ICD-10-CM | POA: Diagnosis not present

## 2019-03-12 DIAGNOSIS — E782 Mixed hyperlipidemia: Secondary | ICD-10-CM | POA: Diagnosis not present

## 2019-03-12 DIAGNOSIS — G2581 Restless legs syndrome: Secondary | ICD-10-CM

## 2019-03-12 DIAGNOSIS — R739 Hyperglycemia, unspecified: Secondary | ICD-10-CM | POA: Diagnosis not present

## 2019-03-12 DIAGNOSIS — B009 Herpesviral infection, unspecified: Secondary | ICD-10-CM

## 2019-03-12 DIAGNOSIS — I480 Paroxysmal atrial fibrillation: Secondary | ICD-10-CM | POA: Diagnosis not present

## 2019-03-12 DIAGNOSIS — I10 Essential (primary) hypertension: Secondary | ICD-10-CM | POA: Diagnosis not present

## 2019-03-12 DIAGNOSIS — E042 Nontoxic multinodular goiter: Secondary | ICD-10-CM

## 2019-03-12 DIAGNOSIS — E785 Hyperlipidemia, unspecified: Secondary | ICD-10-CM

## 2019-03-12 DIAGNOSIS — N138 Other obstructive and reflux uropathy: Secondary | ICD-10-CM | POA: Diagnosis not present

## 2019-03-12 LAB — BASIC METABOLIC PANEL
BUN: 21 mg/dL (ref 6–23)
CO2: 26 mEq/L (ref 19–32)
Calcium: 9.1 mg/dL (ref 8.4–10.5)
Chloride: 107 mEq/L (ref 96–112)
Creatinine, Ser: 1.06 mg/dL (ref 0.40–1.50)
GFR: 68.48 mL/min (ref >60.00)
Glucose, Bld: 103 mg/dL — ABNORMAL HIGH (ref 70–99)
Potassium: 4.2 mEq/L (ref 3.5–5.1)
Sodium: 142 mEq/L (ref 135–145)

## 2019-03-12 LAB — CBC WITH DIFFERENTIAL/PLATELET
Basophils Absolute: 0.1 10*3/uL (ref 0.0–0.1)
Basophils Relative: 0.5 % (ref 0.0–3.0)
Eosinophils Absolute: 0.5 10*3/uL (ref 0.0–0.7)
Eosinophils Relative: 4.9 % (ref 0.0–5.0)
HCT: 41 % (ref 39.0–52.0)
Hemoglobin: 13.7 g/dL (ref 13.0–17.0)
Lymphocytes Relative: 20.9 % (ref 12.0–46.0)
Lymphs Abs: 2.1 10*3/uL (ref 0.7–4.0)
MCHC: 33.4 g/dL (ref 30.0–36.0)
MCV: 83.3 fl (ref 78.0–100.0)
Monocytes Absolute: 0.6 10*3/uL (ref 0.1–1.0)
Monocytes Relative: 5.8 % (ref 3.0–12.0)
Neutro Abs: 6.9 10*3/uL (ref 1.4–7.7)
Neutrophils Relative %: 67.9 % (ref 43.0–77.0)
Platelets: 304 10*3/uL (ref 150.0–400.0)
RBC: 4.92 Mil/uL (ref 4.22–5.81)
RDW: 13.8 % (ref 11.5–15.5)
WBC: 10.2 10*3/uL (ref 4.0–10.5)

## 2019-03-12 LAB — HEPATIC FUNCTION PANEL
ALT: 11 U/L (ref 0–53)
AST: 10 U/L (ref 0–37)
Albumin: 3.8 g/dL (ref 3.5–5.2)
Alkaline Phosphatase: 91 U/L (ref 39–117)
Bilirubin, Direct: 0.1 mg/dL (ref 0.0–0.3)
Total Bilirubin: 0.4 mg/dL (ref 0.2–1.2)
Total Protein: 6.4 g/dL (ref 6.0–8.3)

## 2019-03-12 LAB — PSA: PSA: 2.28 ng/mL (ref 0.10–4.00)

## 2019-03-12 LAB — LIPID PANEL
Cholesterol: 134 mg/dL (ref 0–200)
HDL: 28.4 mg/dL — ABNORMAL LOW (ref >39.00)
LDL Cholesterol: 71 mg/dL (ref 0–99)
NonHDL: 105.15
Total CHOL/HDL Ratio: 5
Triglycerides: 170 mg/dL — ABNORMAL HIGH (ref 0.0–149.0)
VLDL: 34 mg/dL (ref 0.0–40.0)

## 2019-03-12 LAB — TSH: TSH: 1.79 u[IU]/mL (ref 0.35–4.50)

## 2019-03-12 LAB — HEMOGLOBIN A1C: Hgb A1c MFr Bld: 6.1 % (ref 4.6–6.5)

## 2019-03-12 MED ORDER — METAXALONE 800 MG PO TABS: 800.0000 mg | ORAL_TABLET | Freq: Two times a day (BID) | ORAL | 3 refills | Status: DC | PRN

## 2019-03-12 MED ORDER — ACYCLOVIR 200 MG PO CAPS: ORAL_CAPSULE | ORAL | 3 refills | Status: DC

## 2019-03-12 MED ORDER — ATORVASTATIN CALCIUM 10 MG PO TABS: 10.0000 mg | ORAL_TABLET | Freq: Every day | ORAL | 3 refills | Status: DC

## 2019-03-12 MED ORDER — ACYCLOVIR 5 % EX OINT: 1.0000 "application " | TOPICAL_OINTMENT | CUTANEOUS | 5 refills | Status: DC

## 2019-03-12 NOTE — Progress Notes (Signed)
Subjective:    Patient ID: Luke [name], male    DOB: 16-Apr-1945, 74 y.o.   MRN: [id number]  HPI Here to follow up on issues. He feels great. He has not felt any fibrillations for quite a while. He is active. His BP is stable.    Review of Systems  Constitutional: Negative.   HENT: Negative.   Eyes: Negative.   Respiratory: Negative.   Cardiovascular: Negative.   Gastrointestinal: Negative.   Genitourinary: Negative.   Musculoskeletal: Negative.   Skin: Negative.   Neurological: Negative.   Psychiatric/Behavioral: Negative.        Objective:   Physical Exam Constitutional:      General: He is not in acute distress.    Appearance: He is well-developed. He is not diaphoretic.  HENT:     Head: Normocephalic and atraumatic.     Right Ear: External ear normal.     Left Ear: External ear normal.     Nose: Nose normal.     Mouth/Throat:     Pharynx: No oropharyngeal exudate.  Eyes:     General: No scleral icterus.       Right eye: No discharge.        Left eye: No discharge.     Conjunctiva/sclera: Conjunctivae normal.     Pupils: Pupils are equal, round, and reactive to light.  Neck:     Thyroid: No thyromegaly.     Vascular: No JVD.     Trachea: No tracheal deviation.  Cardiovascular:     Rate and Rhythm: Normal rate and regular rhythm.     Heart sounds: Normal heart sounds. No murmur. No friction rub. No gallop.   Pulmonary:     Effort: Pulmonary effort is normal. No respiratory distress.     Breath sounds: Normal breath sounds. No wheezing or rales.  Chest:     Chest wall: No tenderness.  Abdominal:     General: Bowel sounds are normal. There is no distension.     Palpations: Abdomen is soft. There is no mass.     Tenderness: There is no abdominal tenderness. There is no guarding or rebound.  Genitourinary:    Penis: Normal. No tenderness.      Testes: Normal.     Prostate: Normal.     Rectum: Normal. Guaiac result negative.  Musculoskeletal:      General: No tenderness. Normal range of motion.     Cervical back: Neck supple.  Lymphadenopathy:     Cervical: No cervical adenopathy.  Skin:    General: Skin is warm and dry.     Coloration: Skin is not pale.     Findings: No erythema or rash.  Neurological:     Mental Status: He is alert and oriented to person, place, and time.     Cranial Nerves: No cranial nerve deficit.     Motor: No abnormal muscle tone.     Coordination: Coordination normal.     Deep Tendon Reflexes: Reflexes are normal and symmetric. Reflexes normal.  Psychiatric:        Behavior: Behavior normal.        Thought Content: Thought content normal.        Judgment: Judgment normal.           Assessment & Plan:  His HTN and paroxysmal atrial fibrillation are stable. Get fasting labs today to check lipids, an A1c, etc. His immunizations are up to date. He is due for another colonoscopy next month, so  he will contact Dr. Michail Sermon about this.  [name], MD

## 2019-03-19 ENCOUNTER — Ambulatory Visit (INDEPENDENT_AMBULATORY_CARE_PROVIDER_SITE_OTHER): Payer: Medicare Other | Admitting: Family Medicine

## 2019-03-19 ENCOUNTER — Other Ambulatory Visit: Payer: Self-pay

## 2019-03-19 ENCOUNTER — Encounter: Payer: Self-pay | Admitting: Family Medicine

## 2019-03-19 VITALS — BP 120/62 | HR 64 | Temp 98.0°F | Wt 187.8 lb

## 2019-03-19 DIAGNOSIS — I480 Paroxysmal atrial fibrillation: Secondary | ICD-10-CM | POA: Diagnosis not present

## 2019-03-19 DIAGNOSIS — R739 Hyperglycemia, unspecified: Secondary | ICD-10-CM | POA: Diagnosis not present

## 2019-03-19 DIAGNOSIS — E782 Mixed hyperlipidemia: Secondary | ICD-10-CM

## 2019-03-19 DIAGNOSIS — I10 Essential (primary) hypertension: Secondary | ICD-10-CM | POA: Diagnosis not present

## 2019-03-19 NOTE — Progress Notes (Signed)
   Subjective:    Patient ID: Luke [name], male    DOB: 10-Jul-1945, 73 y.o.   MRN: [id number]  HPI Here to recheck his BP and to go over his recent lab results. He feels fine.    Review of Systems  Constitutional: Negative.   Respiratory: Negative.   Cardiovascular: Negative.        Objective:   Physical Exam Constitutional:      Appearance: Normal appearance.  Cardiovascular:     Rate and Rhythm: Normal rate and regular rhythm.     Pulses: Normal pulses.     Heart sounds: Normal heart sounds.  Pulmonary:     Effort: Pulmonary effort is normal.     Breath sounds: Normal breath sounds.  Neurological:     Mental Status: He is alert.           Assessment & Plan:  His HTN and atrial fibrillation are well controlled. His dyslipidemia is stable. Other labs all lokked good.  [name], MD

## 2019-03-25 ENCOUNTER — Ambulatory Visit: Payer: Medicare Other | Attending: Internal Medicine

## 2019-03-25 DIAGNOSIS — Z23 Encounter for immunization: Secondary | ICD-10-CM | POA: Insufficient documentation

## 2019-03-25 NOTE — Progress Notes (Signed)
   Covid-19 Vaccination Clinic  Name:  Luke [name]    MRN: [id number] DOB: Jun 07, 1945  03/25/2019  Luke [name] was observed post Covid-19 immunization for 15 minutes without incidence. He was provided with Vaccine Information Sheet and instruction to access the V-Safe system.   Luke [name] was instructed to call 911 with any severe reactions post vaccine: Marland Kitchen Difficulty breathing  . Swelling of your face and throat  . A fast heartbeat  . A bad rash all over your body  . Dizziness and weakness    Immunizations Administered    Name Date Dose VIS Date Route   Pfizer COVID-19 Vaccine 03/25/2019  3:28 PM 0.3 mL 02/13/2019 Intramuscular   Manufacturer: [place]   Lot: [id number]   [place]: [id number]

## 2019-04-15 ENCOUNTER — Ambulatory Visit: Payer: Medicare Other | Attending: Internal Medicine

## 2019-04-15 DIAGNOSIS — Z23 Encounter for immunization: Secondary | ICD-10-CM

## 2019-04-15 NOTE — Progress Notes (Signed)
   Covid-19 Vaccination Clinic  Name:  BION BORLAND    MRN: [id number] DOB: May 13, 1945  04/15/2019  Mr. Zavatsky was observed post Covid-19 immunization for 30 minutes based on pre-vaccination screening without incidence. He was provided with Vaccine Information Sheet and instruction to access the V-Safe system.   Mr. Rommel was instructed to call 911 with any severe reactions post vaccine: Marland Kitchen Difficulty breathing  . Swelling of your face and throat  . A fast heartbeat  . A bad rash all over your body  . Dizziness and weakness    Immunizations Administered    Name Date Dose VIS Date Route   Pfizer COVID-19 Vaccine 04/15/2019  3:59 PM 0.3 mL 02/13/2019 Intramuscular   Manufacturer: [employer]   Lot: [id number]   [place]: [id number]

## 2019-04-27 DIAGNOSIS — Z86010 Personal history of colonic polyps: Secondary | ICD-10-CM | POA: Diagnosis not present

## 2019-04-28 ENCOUNTER — Telehealth: Payer: Self-pay

## 2019-04-28 NOTE — Telephone Encounter (Signed)
Patient with diagnosis of ATRIAL FIBRILLATION on ELIQUIS for anticoagulation.    Procedure: colonoscopy Date of procedure: 05/15/19  CHADS2-VASc score of  4  (HTN, stroke/tia x 2, AGE). CVA on Feb/2020.  CrCl 75 ml/min  Platelet count 204  Per office protocol, patient can hold ELIQUIS for 24 HOURS prior to procedure due to high risk of CVA.  *Will forward to MD to verify recommendation d/t CVA 1 year ago.  If not bridging, patient should restart ELIQUIS on the evening of procedure or day after, at discretion of procedure MD

## 2019-04-28 NOTE — Telephone Encounter (Signed)
Pharm, please address Eliquis for colonoscopy,  thanks

## 2019-04-28 NOTE — Telephone Encounter (Signed)
   [facility] Pre-operative Risk Assessment    Request for surgical clearance:  1. What type of surgery is being performed? COLONOSCOPY    2. When is this surgery scheduled? 05-15-2019   3. What type of clearance is required (medical clearance vs. Pharmacy clearance to hold med vs. Both)? MEDICATION  4. Are there any medications that need to be held prior to surgery and how long?  ELIQUIS   5. Practice name and name of physician performing surgery? [facility]   6. What is your office phone number  [phone]    7.   What is your office fax number  [phone]  8.   Anesthesia type (None, local, MAC, general) ? NOT LISTED

## 2019-04-29 NOTE — Telephone Encounter (Signed)
Dr. Percival Spanish please address Eliquis thanks- see pharmacy note

## 2019-05-02 NOTE — Telephone Encounter (Signed)
Agree that we can hold Eliquis for one day prior to the procedure.

## 2019-05-04 NOTE — Telephone Encounter (Signed)
I have discussed with the patient Dr. Rosezella [place] recommendation. Will forward clearance to GI service

## 2019-05-12 DIAGNOSIS — Z1159 Encounter for screening for other viral diseases: Secondary | ICD-10-CM | POA: Diagnosis not present

## 2019-05-15 ENCOUNTER — Encounter: Payer: Self-pay | Admitting: Family Medicine

## 2019-05-15 DIAGNOSIS — K635 Polyp of colon: Secondary | ICD-10-CM | POA: Diagnosis not present

## 2019-05-15 DIAGNOSIS — K5730 Diverticulosis of large intestine without perforation or abscess without bleeding: Secondary | ICD-10-CM | POA: Diagnosis not present

## 2019-05-15 DIAGNOSIS — Z86010 Personal history of colonic polyps: Secondary | ICD-10-CM | POA: Diagnosis not present

## 2019-05-15 DIAGNOSIS — K640 First degree hemorrhoids: Secondary | ICD-10-CM | POA: Diagnosis not present

## 2019-05-15 HISTORY — PX: COLONOSCOPY: SHX174

## 2019-05-19 DIAGNOSIS — K635 Polyp of colon: Secondary | ICD-10-CM | POA: Diagnosis not present

## 2019-07-23 NOTE — Progress Notes (Signed)
Cardiology Office Note   Date:  07/24/2019   ID:  Luke [name], Luke [name] Jan 11, 1946, MRN [id number]  PCP:  Laurey Morale, MD  Cardiologist:   Minus Breeding, MD   Chief Complaint  Patient presents with  . Atrial Fibrillation      History of Present Illness: Luke [name] is a 74 y.o. male who presents for follow up of atrial fib.    He was found to have atrial fib in the context of a URI.  He had a negative ischemia work up and unremarkable echo  He did not want to continue with the [facility].   He was in the hospital with a CVA.   This appeared to be embolic. He was subsequently put on Eliquis.    Since I last saw him he has had no new cardiovascular complaints.  He rarely has palpitations but does not think he is having any sustained tachyarrhythmias.  He has not had any presyncope or syncope.  He resolved completely from the stroke and had no residual.  He is not being as active as he should be.  He was in the gym for the pandemic but he has not gotten back to any level of exercise like this.  He is not having any chest pressure, neck or arm discomfort.  He is not having any shortness of breath, PND or orthopnea.  He has had no weight gain or edema.   Past Medical History:  Diagnosis Date  . Asthma   . Depression   . ED (erectile dysfunction)   . Hyperlipidemia   . Hypertension   . Paroxysmal atrial fibrillation ([place]) 09/2016   Initial diagnosis was in setting of acute URI/asthma attack  . Solitary pulmonary nodule 04/16/2013   03/30/13  CXR Question left nipple shadow.  10 mm ovoid nodular density right upper lobe, cannot exclude  pulmonary mass/ nodule > f/u cxr 04/29/2013 > no nodule    . Stroke ([place])   . Ulcer     Past Surgical History:  Procedure Laterality Date  . COLONOSCOPY  04/27/2014   per Dr. Wilford Corner, repeat in 5 yrs   . NM MYOVIEW LTD  10/2016   ailable, images/films reviewed: From Epic Chart or Care Everywhere)  . TRANSTHORACIC ECHOCARDIOGRAM   99991111   Normal systolic function. EF 5560%. No RWMA. Gr 1 DD. Mild AoV calcification - no stenosis. Atrial Septum - lipomatous hypertrophy. --Essentially normal.     Current Outpatient Medications  Medication Sig Dispense Refill  . acyclovir (ZOVIRAX) 200 MG capsule TAKE 1 CAPSULE DAILY 90 capsule 3  . acyclovir ointment (ZOVIRAX) 5 % Apply 1 application topically every 3 (three) hours. 30 g 5  . albuterol (PROVENTIL HFA;VENTOLIN HFA) 108 (90 Base) MCG/ACT inhaler Inhale 2 puffs into the lungs every 6 (six) hours as needed for wheezing or shortness of breath.    Marland Kitchen amLODipine (NORVASC) 10 MG tablet Take 1 tablet (10 mg total) by mouth daily. 90 tablet 3  . atorvastatin (LIPITOR) 10 MG tablet Take 1 tablet (10 mg total) by mouth at bedtime. 90 tablet 3  . carvedilol (COREG) 6.25 MG tablet Take 1 tablet (6.25 mg total) by mouth 2 (two) times daily with a meal. 180 tablet 3  . cephALEXin (KEFLEX) 500 MG capsule Take 1 capsule (500 mg total) by mouth 3 (three) times daily. 30 capsule 0  . cyclobenzaprine (FLEXERIL) 10 MG tablet Take 1 tablet (10 mg total) by mouth 3 (three) times daily  as needed for muscle spasms. 60 tablet 0  . doxycycline (VIBRA-TABS) 100 MG tablet Take 1 tablet (100 mg total) by mouth 2 (two) times daily. 15 tablet 0  . losartan (COZAAR) 100 MG tablet Take 1 tablet (100 mg total) by mouth daily. 90 tablet 3  . metaxalone (SKELAXIN) 800 MG tablet Take 1 tablet (800 mg total) by mouth 2 (two) times daily as needed for muscle spasms. 180 tablet 3  . methylPREDNISolone (MEDROL DOSEPAK) 4 MG TBPK tablet As directed 21 tablet 0  . neomycin-polymyxin-hydrocortisone (CORTISPORIN) OTIC solution Apply 1-2 drops to toe after soaking twice a day 10 mL 0  . omeprazole (PRILOSEC) 20 MG capsule Take 1 capsule (20 mg total) by mouth daily. 90 capsule 3  . sildenafil (VIAGRA) 50 MG tablet Take 1 tablet (50 mg total) by mouth as needed. (Patient taking differently: Take 50 mg by mouth as needed  for erectile dysfunction. ) 10 tablet 11  . triamcinolone cream (KENALOG) 0.1 % Apply 1 application topically 2 (two) times daily. (Patient taking differently: Apply 1 application topically 2 (two) times daily as needed (rash). ) 45 g 2  . apixaban (ELIQUIS) 5 MG TABS tablet Take 1 tablet (5 mg total) by mouth 2 (two) times daily for 30 days. 60 tablet 0   No current facility-administered medications for this visit.    Allergies:   Aspirin, Hydrochlorothiazide, Irbesartan, and Symbicort [budesonide-formoterol fumarate]    ROS:  Please see the history of present illness.   Otherwise, review of systems are positive for none.   All other systems are reviewed and negative.    PHYSICAL EXAM: VS:  BP 138/66   Pulse 63   Ht 5\' 7"  (1.702 m)   Wt 183 lb 3.2 oz (83.1 kg)   SpO2 98%   BMI 28.69 kg/m  , BMI Body mass index is 28.69 kg/m. GENERAL:  Well appearing NECK:  No jugular venous distention, waveform within normal limits, carotid upstroke brisk and symmetric, no bruits, no thyromegaly LUNGS:  Clear to auscultation bilaterally CHEST:  Unremarkable HEART:  PMI not displaced or sustained,S1 and S2 within normal limits, no S3, no S4, no clicks, no rubs, no murmurs ABD:  Flat, positive bowel sounds normal in frequency in pitch, no bruits, no rebound, no guarding, no midline pulsatile mass, no hepatomegaly, no splenomegaly EXT:  2 plus pulses throughout, trace edema, no cyanosis no clubbing    EKG:  EKG is ordered today. The ekg ordered today demonstrates sinus rhythm, rate 58, axis within normal limits, right bundle branch block, no acute ST-T wave changes, no change from previous.   Recent Labs: 03/12/2019: ALT 11; BUN 21; Creatinine, Ser 1.06; Hemoglobin 13.7; Platelets 304.0; Potassium 4.2; Sodium 142; TSH 1.79    Lipid Panel    Component Value Date/Time   CHOL 134 03/12/2019 0914   TRIG 170.0 (H) 03/12/2019 0914   HDL 28.40 (L) 03/12/2019 0914   CHOLHDL 5 03/12/2019 0914   VLDL  34.0 03/12/2019 0914   LDLCALC 71 03/12/2019 0914   LDLDIRECT 73.0 02/08/2015 0906      Wt Readings from Last 3 Encounters:  07/24/19 183 lb 3.2 oz (83.1 kg)  03/19/19 187 lb 12.8 oz (85.2 kg)  03/12/19 182 lb 6.4 oz (82.7 kg)      Other studies Reviewed: Additional studies/ records that were reviewed today include: Labs.   [facility] notes Review of the above records demonstrates:  Please see elsewhere in the note.     ASSESSMENT  AND PLAN:  ATRIAL FIB:Mr.Serjio Strohmaier Woodcockhas a CHA2DS2 - VASc score of 4.He tolerates anticoagulation.  He is not having any symptomatic tachypalpitations.  No change in therapy.    HTN: The blood pressure isat target.  No change in therapy.   CAROTID DOPPLER:60 to 79% right carotid stenosis (60% on angiogram) and 40 to 59% left stenosis.  I reviewed a new from Dr. Carlis Abbott.  He was to follow up around December.  He sought a second opinion at the [place] and they have follow this up and has not suggested surgery.  He is following up at the [place] and apparently at [facility] as well.  He and I talked quite a bit about the importance of follow-up.  DYSLIPDEMIA: LDL of 71 with an HDL of 28.4.  No change in therapy.  COVID EDUCATION: He has had his vaccines.  Current medicines are reviewed at length with the patient today.  The patient does not have concerns regarding medicines.  The following changes have been made:  no change  Labs/ tests ordered today include: None  Orders Placed This Encounter  Procedures  . EKG 12-Lead     Disposition:   FU with me in one year.     Signed, Minus Breeding, MD  07/24/2019 2:42 PM    [facility]

## 2019-07-24 ENCOUNTER — Encounter: Payer: Self-pay | Admitting: Cardiology

## 2019-07-24 ENCOUNTER — Ambulatory Visit (INDEPENDENT_AMBULATORY_CARE_PROVIDER_SITE_OTHER): Payer: Medicare Other | Admitting: Cardiology

## 2019-07-24 ENCOUNTER — Other Ambulatory Visit: Payer: Self-pay

## 2019-07-24 VITALS — BP 138/66 | HR 63 | Ht 67.0 in | Wt 183.2 lb

## 2019-07-24 DIAGNOSIS — I10 Essential (primary) hypertension: Secondary | ICD-10-CM

## 2019-07-24 DIAGNOSIS — I480 Paroxysmal atrial fibrillation: Secondary | ICD-10-CM

## 2019-07-24 DIAGNOSIS — E785 Hyperlipidemia, unspecified: Secondary | ICD-10-CM

## 2019-07-24 DIAGNOSIS — I6523 Occlusion and stenosis of bilateral carotid arteries: Secondary | ICD-10-CM

## 2019-07-24 DIAGNOSIS — Z7189 Other specified counseling: Secondary | ICD-10-CM

## 2019-07-24 NOTE — Patient Instructions (Signed)

## 2019-11-03 ENCOUNTER — Telehealth: Payer: Self-pay | Admitting: Family Medicine

## 2019-11-03 NOTE — Progress Notes (Signed)
  Chronic Care Management   Note  11/03/2019 Name: Luke [name] MRN: [id number] DOB: 09/03/1945  Luke [name] is a 74 y.o. year old male who is a primary care patient of Laurey Morale, MD. I reached out to Lily Peer by phone today in response to a referral sent by Luke [name]'s PCP, Laurey Morale, MD.   [name] was given information about Chronic Care Management services today including:  1. CCM service includes personalized support from designated clinical staff supervised by his physician, including individualized plan of care and coordination with other care providers 2. 24/7 contact phone numbers for assistance for urgent and routine care needs. 3. Service will only be billed when office clinical staff spend 20 minutes or more in a month to coordinate care. 4. Only one practitioner may furnish and bill the service in a calendar month. 5. The patient may stop CCM services at any time (effective at the end of the month) by phone call to the office staff.   Patient agreed to services and verbal consent obtained.   Follow up plan:   Carley Perdue [employer] Scheduler

## 2019-12-07 ENCOUNTER — Other Ambulatory Visit: Payer: Self-pay

## 2019-12-07 ENCOUNTER — Encounter: Payer: Self-pay | Admitting: Family Medicine

## 2019-12-07 ENCOUNTER — Ambulatory Visit (INDEPENDENT_AMBULATORY_CARE_PROVIDER_SITE_OTHER): Payer: Medicare Other | Admitting: Family Medicine

## 2019-12-07 ENCOUNTER — Telehealth: Payer: Self-pay | Admitting: Family Medicine

## 2019-12-07 VITALS — BP 120/70 | HR 56 | Temp 98.7°F | Ht 67.0 in | Wt 180.4 lb

## 2019-12-07 DIAGNOSIS — E782 Mixed hyperlipidemia: Secondary | ICD-10-CM

## 2019-12-07 DIAGNOSIS — I10 Essential (primary) hypertension: Secondary | ICD-10-CM

## 2019-12-07 DIAGNOSIS — I6523 Occlusion and stenosis of bilateral carotid arteries: Secondary | ICD-10-CM

## 2019-12-07 DIAGNOSIS — H9313 Tinnitus, bilateral: Secondary | ICD-10-CM

## 2019-12-07 NOTE — Progress Notes (Signed)
   Subjective:    Patient ID: Luke [name], male    DOB: 1945-08-29, 74 y.o.   MRN: [id number]  HPI Here for several issues. First he has had intermittent ringing in both ears for the past 6 months. This is very faint so it only bothers him at night. No recent change in hearing. No ear pain or dizziness. Also he recently saw a provider at the [place] who was concerned about his mild carotid disease. He proposed increasing the dose of his Lipitor to 40 mg daily, but he asked Luke [name] to discuss this with [place]. His last LDL in January was 71.    Review of Systems  Constitutional: Negative.   HENT: Positive for tinnitus. Negative for ear pain and hearing loss.   Eyes: Negative.   Respiratory: Negative.   Cardiovascular: Negative.   Neurological: Negative.        Objective:   Physical Exam Constitutional:      Appearance: Normal appearance.  HENT:     Right Ear: Tympanic membrane, ear canal and external ear normal.     Left Ear: Tympanic membrane, ear canal and external ear normal.  Cardiovascular:     Rate and Rhythm: Normal rate and regular rhythm.     Pulses: Normal pulses.     Heart sounds: Normal heart sounds.  Pulmonary:     Effort: Pulmonary effort is normal.     Breath sounds: Normal breath sounds.  Neurological:     Mental Status: He is alert.           Assessment & Plan:  He has bilateral tinnitus. I explained that this cannot be treated as such, but that limiting his consumption of caffeine may decrease the problem. I advised him to protect his hearing when exposed to loud noises like lawn mowers, etc. As for the Lipitor, I advised against increasing the dose because he has reached target lipid levels and I am concerned he may develop side effects like muscle weakness or myalgia. He agreed with this.  [name], MD

## 2019-12-07 NOTE — Telephone Encounter (Signed)
-----   Message from Viona Gilmore, [facility] sent at 12/04/2019 10:40 AM EDT ----- Regarding: CCM referral Hello Dr. Sarajane Jews,  Can you please place a CCM referral for Mr. Luke [name]? He is on my schedule in the next few weeks.  Thank you so much!  Best, Maddie  [name], PharmD Clinical Pharmacist [place] [place]

## 2019-12-11 ENCOUNTER — Telehealth: Payer: Self-pay | Admitting: Pharmacist

## 2019-12-14 ENCOUNTER — Ambulatory Visit: Payer: Medicare Other | Admitting: Pharmacist

## 2019-12-14 ENCOUNTER — Other Ambulatory Visit: Payer: Self-pay

## 2019-12-14 DIAGNOSIS — E782 Mixed hyperlipidemia: Secondary | ICD-10-CM

## 2019-12-14 DIAGNOSIS — I10 Essential (primary) hypertension: Secondary | ICD-10-CM

## 2019-12-14 NOTE — Chronic Care Management (AMB) (Signed)
Chronic Care Management Pharmacy  Name: JORAM VENSON  MRN: [id number] DOB: 1945-12-01  Initial Planning Appointment:  Completed 12/11/19  Initial Questions: 1. Have you seen any other providers since your last visit? n/a 2. Any changes in your medicines or health? No   Chief Complaint/ HPI  Luke [name],  74 y.o. , male presents for their Initial CCM visit with the clinical pharmacist In office.  PCP : Laurey Morale, MD  Their chronic conditions include: HTN, HLD, A fib, GERD, BPH, SOB, ED, muscle spasms, HSV prophylaxis, allergic rhinitis  Office Visits: -12/07/19 [name], MD: Patient presented for ringing in ears evaluation. Pt reports it is ongoing for the past 6 months and only bothers him at night. [facility] provider recommended increase in Lipitor dose due to carotid disease and decided against due to concern for side effects.  Consult Visit: -07/24/19 Minus Breeding, MD (cardiology): Patient presented for Afib follow up. Pt reports rare palpitations and denies chest pressure, neck, or arm discomfort. Follow up in one year.  -05/19/19 Mali Rund (pathology): Patient presented for evaluation of colon polyp. Unable to access notes.  Medications: Outpatient Encounter Medications as of 12/14/2019  Medication Sig  . acyclovir (ZOVIRAX) 200 MG capsule TAKE 1 CAPSULE DAILY (Patient taking differently: Take 200 mg by mouth. TAKE 1 CAPSULE EVERY OTHER DAY)  . amLODipine (NORVASC) 10 MG tablet Take 1 tablet (10 mg total) by mouth daily.  Marland Kitchen apixaban (ELIQUIS) 5 MG TABS tablet Take 1 tablet (5 mg total) by mouth 2 (two) times daily for 30 days.  Marland Kitchen atorvastatin (LIPITOR) 10 MG tablet Take 1 tablet (10 mg total) by mouth at bedtime.  . carvedilol (COREG) 6.25 MG tablet Take 1 tablet (6.25 mg total) by mouth 2 (two) times daily with a meal.  . losartan (COZAAR) 100 MG tablet Take 1 tablet (100 mg total) by mouth daily.  Marland Kitchen omeprazole (PRILOSEC) 20 MG capsule Take 1 capsule (20 mg  total) by mouth daily.  . sildenafil (VIAGRA) 50 MG tablet Take 1 tablet (50 mg total) by mouth as needed. (Patient taking differently: Take 50 mg by mouth as needed for erectile dysfunction. )  . acyclovir ointment (ZOVIRAX) 5 % Apply 1 application topically every 3 (three) hours. (Patient not taking: Reported on 12/14/2019)  . albuterol (PROVENTIL HFA;VENTOLIN HFA) 108 (90 Base) MCG/ACT inhaler Inhale 2 puffs into the lungs every 6 (six) hours as needed for wheezing or shortness of breath. (Patient not taking: Reported on 12/14/2019)  . cephALEXin (KEFLEX) 500 MG capsule Take 1 capsule (500 mg total) by mouth 3 (three) times daily.  . cyclobenzaprine (FLEXERIL) 10 MG tablet Take 1 tablet (10 mg total) by mouth 3 (three) times daily as needed for muscle spasms.  Marland Kitchen doxycycline (VIBRA-TABS) 100 MG tablet Take 1 tablet (100 mg total) by mouth 2 (two) times daily.  . metaxalone (SKELAXIN) 800 MG tablet Take 1 tablet (800 mg total) by mouth 2 (two) times daily as needed for muscle spasms. (Patient not taking: Reported on 12/14/2019)  . methylPREDNISolone (MEDROL DOSEPAK) 4 MG TBPK tablet As directed  . neomycin-polymyxin-hydrocortisone (CORTISPORIN) OTIC solution Apply 1-2 drops to toe after soaking twice a day  . triamcinolone cream (KENALOG) 0.1 % Apply 1 application topically 2 (two) times daily. (Patient taking differently: Apply 1 application topically 2 (two) times daily as needed (rash). )   No facility-administered encounter medications on file as of 12/14/2019.   Patient reports he lives with his wife and he  does the cooking and she does the cleaning. Patient does not eat much red meat and pork about once a week. He usually eats chicken and fish and loves veggies (cabbage, broccoli) but does admit to having a sweet tooth and eating some sweets every day.  Patient goes to bed around 11-11:30 and gets up around 8-9. He denies having trouble sleeping but does get up about 2-3 times during the night  to go to the bathroom. Patient drinks 1 cup of coffee every morning.   Patient was going to the gym 3-4 times a week pre-COVID but has not exercised much since. Patient does have family in the area and sees them often.  Patient reports he doesn't have issues with his medications other than feeling jittery/anxious every once in a while and he is unsure if this is medication related.  Current Diagnosis/Assessment:  Goals Addressed            This Visit's Progress   . Pharmacy Care Plan       CARE PLAN ENTRY (see longitudinal plan of care for additional care plan information)  Current Barriers:  . Chronic Disease Management support, education, and care coordination needs related to Hypertension, Hyperlipidemia, Atrial Fibrillation, and GERD   Hypertension BP Readings from Last 3 Encounters:  12/07/19 120/70  07/24/19 138/66  03/19/19 120/62   . Pharmacist Clinical Goal(s): o Over the next 90 days, patient will work with PharmD and providers to maintain BP goal <130/80 . Current regimen:  . Amlodipine 10 mg 1 tablet daily  . Losartan 100 mg 1 tablet daily  . Carvedilol 6.25 mg 1 tablet twice daily . Interventions: o We discussed taking carvedilol with food to minimize possible side effects and slow absorption o We discussed the importance of monitoring blood pressure at home regularly o We discussed maintaining a low salt diet and increasing exercise in order to help manage high blood pressure . Patient self care activities - Over the next 90 days, patient will: o Check blood pressure weekly, document, and provide at future appointments o Ensure daily salt intake < 2300 mg/day o Take carvedilol with food or around a mealtime  Hyperlipidemia Lab Results  Component Value Date/Time   LDLCALC 71 03/12/2019 09:14 AM   LDLDIRECT 73.0 02/08/2015 09:06 AM   . Pharmacist Clinical Goal(s): o Over the next 90 days, patient will work with PharmD and providers to achieve LDL goal <  70 . Current regimen:  o Atorvastatin 10 mg 1 tablet at bedtime . Interventions: o We discussed increasing exercise in order to help lower cholesterol o We discussed incorporating 10-25 g of fiber in diet to help lower cholesterol through foods such as green leafy vegetables, fruit, and oatmeal . Patient self care activities - Over the next 90 days, patient will: o Continue current medications  A fib . Pharmacist Clinical Goal(s): o Over the next 90 days, patient will work with PharmD and providers to maintain heart rate between 60-100 beats per minute . Current regimen:  . Eliquis 5 mg 1 tablet twice daily . Carvedilol 6.25 mg 1 tablet twice daily . Interventions: o We discussed monitoring for bleeding such as blood in urine or stool, nosebleeds, or unexpected bruising . Patient self care activities - Over the next 90 days, patient will: o Check heart rate weekly along with blood pressure o Contact provider with any episodes of bleeding  GERD . Pharmacist Clinical Goal(s) o Over the next 90 days, patient will work with PharmD  and providers to maintain symptoms of indigestions/heartburn . Current regimen:  o Omeprazole 20 mg 1 capsule every other day . Interventions: o We discussed trying Tums over the counter as needed for relief of symptoms o We discussed other ways to manage symptoms such as avoiding food 2 hours before bedtime, avoiding spicy/acidic/fatty foods, and elevating head of bed . Patient self care activities - Over the next 90 days, patient will: o Continue current medications o Can use Tums as needed for symptoms in addition to omeprazole  Medication management . Pharmacist Clinical Goal(s): o Over the next 90 days, patient will work with PharmD and providers to maintain optimal medication adherence . [facility] . Interventions o Comprehensive medication review performed. o Continue current medication management strategy . Patient self care  activities - Over the next 90 days, patient will: o Take medications as prescribed o Report any questions or concerns to PharmD and/or provider(s)  Initial goal documentation        Hypertension   BP goal is:  <130/80  Office blood pressures are  BP Readings from Last 3 Encounters:  12/07/19 120/70  07/24/19 138/66  03/19/19 120/62   Patient checks BP at home infrequently Patient home BP readings are ranging: none  Patient has failed these meds in the past: diltiazem (changed to metoprolol), HCTZ (leg cramps), metoprolol (unknown) Patient is currently controlled on the following medications:  . Amlodipine 10 mg 1 tablet daily - in AM . Losartan 100 mg 1 tablet daily - in AM . Carvedilol 6.25 mg 1 tablet twice daily  We discussed diet and exercise extensively -Diet: we discussed using a salt substitute as well as checking package labels for sodium content -Exercise: encouraged patient to start exercising again for benefits on lowering blood pressure and cholesterol -We discussed the importance of checking blood pressure at home while taking BP lowering medications -We discussed taking carvedilol around meal times to slow the rate of absorption and prevent orthostatic effects.  Plan Start taking carvedilol around meal times. Continue current medications     AFIB   Patient is currently rate controlled. Office heart rates are  Pulse Readings from Last 3 Encounters:  12/07/19 (!) 56  07/24/19 63  03/19/19 64    CHA2DS2-VASc Score =   4 The patient's score is based upon: age, CVA, HTN   Patient has failed these meds in past: metoprolol (unknown) Patient is currently controlled on the following medications:  . Eliquis 5 mg 1 tablet twice daily . Carvedilol 6.25 mg 1 tablet twice daily  We discussed:  monitoring for symptoms of bleeding (blood in urine or stool, nosebleeds, unexpected bruising, etc.)  Plan Recommended monitoring heart rate along with blood  pressure Continue current medications    Hyperlipidemia   LDL goal < 70  Lipid Panel     Component Value Date/Time   CHOL 134 03/12/2019 0914   TRIG 170.0 (H) 03/12/2019 0914   HDL 28.40 (L) 03/12/2019 0914   LDLCALC 71 03/12/2019 0914   LDLDIRECT 73.0 02/08/2015 0906    Hepatic Function Latest Ref Rng & Units 03/12/2019 04/19/2018 02/24/2018  Total Protein 6.0 - 8.3 g/dL 6.4 6.8 6.7  Albumin 3.5 - 5.2 g/dL 3.8 3.5 4.1  AST 0 - 37 U/L _0 ALT 0 - 53 U/L _1 Alk Phosphatase 39 - 117 U/L 91 82 97  Total Bilirubin 0.2 - 1.2 mg/dL 0.4 0.5 0.6  Bilirubin, Direct 0.0 - 0.3  mg/dL 0.1 - 0.1     The ASCVD Risk score Mikey Bussing DC Jr., et al., 2013) failed to calculate for the following reasons:   The patient has a prior MI or stroke diagnosis   Patient has failed these meds in past: simvastatin  Patient is currently uncontrolled on the following medications:  . Atorvastatin 10 mg 1 tablet at bedtime  We discussed:  diet and exercise extensively  -Diet: we discussed using olive oil if cooking with oil and avoiding butter and switching to baking and broiling instead of frying foods -Exercise -We discussed [facility] doctors recommendations for increasing statin therapy due to carotid disease. May reassess after next lipid panel - currently borderline at goal.  Plan Could consider switching to alternative statin if concerned about side effects. Continue current medications  GERD   Patient has failed these meds in past: none Patient is currently controlled on the following medications:  . Omeprazole 20 mg 1 capsule every other day  We discussed:  Patient reports taking for indigestion; we discussed possibly continuing to taper off and stop medicine in future if no symptoms with every other day treatment; non-pharmacologic management (avoiding food 2 hours before bedtime, avoiding spicy/acidic/fatty foods)  Plan Patient can use Tums PRN for symptom management. Continue current  medications    Asthma/SOB   Patient has failed these meds in past: none Patient is currently controlled on the following medications:  . Albuterol HFA inhale 2 puffs every 6 hours as needed (not used in several years)  Using maintenance inhaler regularly? No - does not need one Frequency of rescue inhaler use:  prn - has not used in several years   Plan  Continue current medications   ED   Patient has failed these meds in past: none Patient is currently controlled on the following medications:  . Sildenafil 50 mg 1 tablet as needed  Plan  Continue current medications  Muscle spasms   Patient has failed these meds in past: Flexeril (completed short course) Patient is currently controlled on the following medications:  Marland Kitchen Metaxalone 800 mg 1 tablet twice daily as needed  We discussed:  Patient has not used lately but has some on hand just in case  Plan  Continue current medications   HSV prophylaxis   Patient has failed these meds in past: none Patient is currently controlled on the following medications:  . Acyclovir 200 mg 1 capsule every other day  Plan  Continue current medications   BPH   PSA  Date Value Ref Range Status  03/12/2019 2.28 0.10 - 4.00 ng/mL Final    Comment:    Test performed using Access Hybritech PSA Assay, a parmagnetic partical, chemiluminecent immunoassay.  02/24/2018 3.13 0.10 - 4.00 ng/mL Final    Comment:    Test performed using Access Hybritech PSA Assay, a parmagnetic partical, chemiluminecent immunoassay.  02/21/2017 2.25 0.10 - 4.00 ng/mL Final    Comment:    Test performed using Access Hybritech PSA Assay, a parmagnetic partical, chemiluminecent immunoassay.     Patient has failed these meds in past: none Patient is currently controlled on the following medications:  . No medications    Plan  Continue current management.   Allergic rhinitis   Patient has failed these meds in past: none Patient is currently  controlled on the following medications:  . No medications  We discussed:  Patient has used claritin and Flonase in the past and has found relief; discussed avoiding benadryl in older adults due  to increased risk of falls  Plan  Continue current management.   Vaccines   Reviewed and discussed patient's vaccination history.    Immunization History  Administered Date(s) Administered  . Fluad Quad(high Dose 65+) 11/11/2018  . Influenza Split 12/18/2010, 01/04/2012  . Influenza Whole 12/20/2009  . Influenza, High Dose Seasonal PF 12/17/2012, 12/13/2015, 11/26/2016, 12/06/2017  . Influenza,inj,Quad PF,6+ Mos 11/12/2013, 01/06/2015  . PFIZER SARS-COV-2 Vaccination 03/25/2019, 04/15/2019  . Pneumococcal Conjugate-13 11/12/2013  . Pneumococcal Polysaccharide-23 06/13/2011  . Td 04/23/2003  . Tdap 11/27/2016  . Zoster 01/16/2013    Plan  Recommended patient receive Shingrix and influenza vaccine in office/at pharmacy.   Medication Management   [facility] Uses pill box? Yes - pill box (once weekly); timer to remember to take Pt endorses 100% compliance  We discussed: Current pharmacy is preferred with insurance plan and patient is satisfied with pharmacy services  Plan:  Continue current medication management strategy   Follow up: 3 month phone visit   [name], PharmD Clinical Pharmacist [place] [facility] [place] [phone]

## 2019-12-14 NOTE — Chronic Care Management (AMB) (Signed)
Chronic Care Management Pharmacy Assistant   Name: Luke [name]  MRN: [id number] DOB: [birth year]  Reason for Encounter: Reason for Encounter: Medication Review/Initial Questions for Pharmacist visit on 12/14/2019   Patient Questions: 1. Have you seen any other providers since your last visit? No 2. Any changes in your medications or health? No  3. Any side effects from any medications? No 4. Do you have any symptoms or problems not managed by your medications? No 5. Any concerns about your health right now?  Yes, states that he has some concerns about fatigue. 6. Has your provider asked that you check blood pressure, blood sugar, or follow a special diet at home?  No 7. Do you get any type of exercise regularly? No 8. Can you think of a goal you would like to reach for your health? He says he would like to be healthier overall. 9. Do you have any problems getting your medications? No 10. Is there anything that you would like to discuss during the appointment? No  PCP : Laurey Morale, MD  Allergies:   Allergies  Allergen Reactions  . Aspirin Other (See Comments)    REACTION: ULCER @LARGE  DOSES  . Hydrochlorothiazide Other (See Comments)    Leg cramps  . Irbesartan Other (See Comments)    Headaches   . Symbicort [Budesonide-Formoterol Fumarate] Other (See Comments)    legcramps    Medications: Outpatient Encounter Medications as of 12/11/2019  Medication Sig  . acyclovir (ZOVIRAX) 200 MG capsule TAKE 1 CAPSULE DAILY  . acyclovir ointment (ZOVIRAX) 5 % Apply 1 application topically every 3 (three) hours.  Marland Kitchen albuterol (PROVENTIL HFA;VENTOLIN HFA) 108 (90 Base) MCG/ACT inhaler Inhale 2 puffs into the lungs every 6 (six) hours as needed for wheezing or shortness of breath.  Marland Kitchen amLODipine (NORVASC) 10 MG tablet Take 1 tablet (10 mg total) by mouth daily.  Marland Kitchen atorvastatin (LIPITOR) 10 MG tablet Take 1 tablet (10 mg total) by mouth at bedtime.  . carvedilol (COREG) 6.25 MG  tablet Take 1 tablet (6.25 mg total) by mouth 2 (two) times daily with a meal.  . cephALEXin (KEFLEX) 500 MG capsule Take 1 capsule (500 mg total) by mouth 3 (three) times daily.  . cyclobenzaprine (FLEXERIL) 10 MG tablet Take 1 tablet (10 mg total) by mouth 3 (three) times daily as needed for muscle spasms.  Marland Kitchen doxycycline (VIBRA-TABS) 100 MG tablet Take 1 tablet (100 mg total) by mouth 2 (two) times daily.  Marland Kitchen losartan (COZAAR) 100 MG tablet Take 1 tablet (100 mg total) by mouth daily.  . metaxalone (SKELAXIN) 800 MG tablet Take 1 tablet (800 mg total) by mouth 2 (two) times daily as needed for muscle spasms.  . methylPREDNISolone (MEDROL DOSEPAK) 4 MG TBPK tablet As directed  . neomycin-polymyxin-hydrocortisone (CORTISPORIN) OTIC solution Apply 1-2 drops to toe after soaking twice a day  . omeprazole (PRILOSEC) 20 MG capsule Take 1 capsule (20 mg total) by mouth daily.  . sildenafil (VIAGRA) 50 MG tablet Take 1 tablet (50 mg total) by mouth as needed. (Patient taking differently: Take 50 mg by mouth as needed for erectile dysfunction. )  . triamcinolone cream (KENALOG) 0.1 % Apply 1 application topically 2 (two) times daily. (Patient taking differently: Apply 1 application topically 2 (two) times daily as needed (rash). )   No facility-administered encounter medications on file as of 12/11/2019.    Current Diagnosis: Patient Active Problem List   Diagnosis Date Noted  . Tinnitus, bilateral 12/07/2019  .  Numbness 07/29/2018  . Educated about COVID-19 virus infection 07/29/2018  . Cerebrovascular accident (CVA) due to embolism of cerebral artery ([place]) 05/16/2018  . Dyslipidemia 05/16/2018  . Nodule of upper lobe of right lung 04/29/2018  . Bilateral carotid artery stenosis 04/29/2018  . Multiple thyroid nodules 04/29/2018  . Acute CVA (cerebrovascular accident) ([place]) 04/19/2018  . BPH with urinary obstruction 02/21/2017  . NSVT (nonsustained ventricular tachycardia) ([place]) 09/27/2016  . New  onset atrial fibrillation ([place]) 09/25/2016  . PAF (paroxysmal atrial fibrillation) ([place]) 09/25/2016  . Genital herpes 02/18/2016  . Hyperglycemia 02/18/2016  . Left knee pain 02/01/2015  . Restless leg syndrome 03/30/2013  . Allergic rhinitis 10/31/2009  . Headache 10/31/2009  . DEGENERATIVE DISC DISEASE, CERVICAL SPINE, W/RADICULOPATHY 07/28/2008  . Essential hypertension 07/08/2007  . Asthma 07/02/2007  . Hyperlipidemia 05/02/2007  . ERECTILE DYSFUNCTION, MILD 05/02/2007  . PVC (premature ventricular contraction) 05/02/2007  . GERD (gastroesophageal reflux disease) 05/02/2007    Goals Addressed   None     Follow-Up:  Pharmacist Review   Maia Breslow, [place] Assistant [phone]

## 2019-12-16 NOTE — Patient Instructions (Addendum)
Hi Mr. Luke [name],  It was so lovely getting to meet you in person! As we discussed, please check your blood pressure at home a few times a month to make sure your medicines are working properly and make sure to take carvedilol with food or around meal times. I did attach some helpful information for eating to lower cholesterol as you are close to goal and make sure to increase fiber in your diet as that can help as well and work on adding in some exercise each day as this can make a huge difference.  We also talked about getting your flu shot and Shingles shot (Shingrix) in order to stay up to date on vaccines and to protect yourself from these viruses.  Please don't hesitate to give me a call if you have questions or need anything before our next touch base!  Best, Maddie  [name], PharmD Clinical Pharmacist [facility]   Visit Information  Goals Addressed            This Visit's Progress   . Pharmacy Care Plan       CARE PLAN ENTRY (see longitudinal plan of care for additional care plan information)  Current Barriers:  . Chronic Disease Management support, education, and care coordination needs related to Hypertension, Hyperlipidemia, Atrial Fibrillation, and GERD   Hypertension BP Readings from Last 3 Encounters:  12/07/19 120/70  07/24/19 138/66  03/19/19 120/62   . Pharmacist Clinical Goal(s): o Over the next 90 days, patient will work with PharmD and providers to maintain BP goal <130/80 . Current regimen:  . Amlodipine 10 mg 1 tablet daily  . Losartan 100 mg 1 tablet daily  . Carvedilol 6.25 mg 1 tablet twice daily . Interventions: o We discussed taking carvedilol with food to minimize possible side effects and slow absorption o We discussed the importance of monitoring blood pressure at home regularly o We discussed maintaining a low salt diet and increasing exercise in order to help manage high blood pressure . Patient  self care activities - Over the next 90 days, patient will: o Check blood pressure weekly, document, and provide at future appointments o Ensure daily salt intake < 2300 mg/day o Take carvedilol with food or around a mealtime  Hyperlipidemia Lab Results  Component Value Date/Time   LDLCALC 71 03/12/2019 09:14 AM   LDLDIRECT 73.0 02/08/2015 09:06 AM   . Pharmacist Clinical Goal(s): o Over the next 90 days, patient will work with PharmD and providers to achieve LDL goal < 70 . Current regimen:  o Atorvastatin 10 mg 1 tablet at bedtime . Interventions: o We discussed increasing exercise in order to help lower cholesterol o We discussed incorporating 10-25 g of fiber in diet to help lower cholesterol through foods such as green leafy vegetables, fruit, and oatmeal . Patient self care activities - Over the next 90 days, patient will: o Continue current medications  A fib . Pharmacist Clinical Goal(s): o Over the next 90 days, patient will work with PharmD and providers to maintain heart rate between 60-100 beats per minute . Current regimen:  . Eliquis 5 mg 1 tablet twice daily . Carvedilol 6.25 mg 1 tablet twice daily . Interventions: o We discussed monitoring for bleeding such as blood in urine or stool, nosebleeds, or unexpected bruising . Patient self care activities - Over the next 90 days, patient will: o Check heart rate weekly along with blood pressure o Contact provider with any episodes of bleeding  GERD . Pharmacist Clinical Goal(s) o Over the next 90 days, patient will work with PharmD and providers to maintain symptoms of indigestions/heartburn . Current regimen:  o Omeprazole 20 mg 1 capsule every other day . Interventions: o We discussed trying Tums over the counter as needed for relief of symptoms o We discussed other ways to manage symptoms such as avoiding food 2 hours before bedtime, avoiding spicy/acidic/fatty foods, and elevating head of bed . Patient self  care activities - Over the next 90 days, patient will: o Continue current medications o Can use Tums as needed for symptoms in addition to omeprazole  Medication management . Pharmacist Clinical Goal(s): o Over the next 90 days, patient will work with PharmD and providers to maintain optimal medication adherence . Current pharmacy: [facility] . Interventions o Comprehensive medication review performed. o Continue current medication management strategy . Patient self care activities - Over the next 90 days, patient will: o Take medications as prescribed o Report any questions or concerns to PharmD and/or provider(s)  Initial goal documentation        Mr. Bartoli was given information about Chronic Care Management services today including:  1. CCM service includes personalized support from designated clinical staff supervised by his physician, including individualized plan of care and coordination with other care providers 2. 24/7 contact phone numbers for assistance for urgent and routine care needs. 3. Standard insurance, coinsurance, copays and deductibles apply for chronic care management only during months in which we provide at least 20 minutes of these services. Most insurances cover these services at 100%, however patients may be responsible for any copay, coinsurance and/or deductible if applicable. This service may help you avoid the need for more expensive face-to-face services. 4. Only one practitioner may furnish and bill the service in a calendar month. 5. The patient may stop CCM services at any time (effective at the end of the month) by phone call to the office staff.  Patient agreed to services and verbal consent obtained.   The patient verbalized understanding of instructions provided today and agreed to receive a mailed copy of patient instruction and/or educational materials. Telephone follow up appointment with pharmacy team member scheduled for: 3  months   Preventing High Cholesterol Cholesterol is a white, waxy substance similar to fat that the human body needs to help build cells. The liver makes all the cholesterol that a person's body needs. Having high cholesterol (hypercholesterolemia) increases a person's risk for heart disease and stroke. Extra (excess) cholesterol comes from the food the person eats. High cholesterol can often be prevented with diet and lifestyle changes. If you already have high cholesterol, you can control it with diet and lifestyle changes and with medicine. How can high cholesterol affect me? If you have high cholesterol, deposits (plaques) may build up on the walls of your arteries. The arteries are the blood vessels that carry blood away from your heart. Plaques make the arteries narrower and stiffer. This can limit or block blood flow and cause blood clots to form. Blood clots:  Are tiny balls of cells that form in your blood.  Can move to the heart or brain, causing a heart attack or stroke. Plaques in arteries greatly increase your risk for heart attack and stroke.Making diet and lifestyle changes can reduce your risk for these conditions that may threaten your life. What can increase my risk? This condition is more likely to develop in people who:  Eat foods that are high in  saturated fat or cholesterol. Saturated fat is mostly found in: ? Foods that contain animal fat, such as red meat and some dairy products. ? Certain fatty foods made from plants, such as tropical oils.  Are overweight.  Are not getting enough exercise.  Have a family history of high cholesterol. What actions can I take to prevent this? Nutrition   Eat less saturated fat.  Avoid trans fats (partially hydrogenated oils). These are often found in margarine and in some baked goods, fried foods, and snacks bought in packages.  Avoid precooked or cured meat, such as sausages or meat loaves.  Avoid foods and drinks that have  added sugars.  Eat more fruits, vegetables, and whole grains.  Choose healthy sources of protein, such as fish, poultry, lean cuts of red meat, beans, peas, lentils, and nuts.  Choose healthy sources of fat, such as: ? Nuts. ? Vegetable oils, especially olive oil. ? Fish that have healthy fats (omega-3 fatty acids), such as mackerel or salmon. The items listed above may not be a complete list of recommended foods and beverages. Contact a dietitian for more information. Lifestyle  Lose weight if you are overweight. Losing 5-10 lb (2.3-4.5 kg) can help prevent or control high cholesterol. It can also lower your risk for diabetes and high blood pressure. Ask your health care provider to help you with a diet and exercise plan to lose weight safely.  Do not use any products that contain nicotine or tobacco, such as cigarettes, e-cigarettes, and chewing tobacco. If you need help quitting, ask your health care provider.  Limit your alcohol intake. ? Do not drink alcohol if:  Your health care provider tells you not to drink.  You are pregnant, may be pregnant, or are planning to become pregnant. ? If you drink alcohol:  Limit how much you use to:  0-1 drink a day for women.  0-2 drinks a day for men.  Be aware of how much alcohol is in your drink. In the [place] one drink equals one 12 oz bottle of beer (355 mL), one 5 oz glass of wine (148 mL), or one 1 oz glass of hard liquor (44 mL). Activity   Get enough exercise. Each week, do at least 150 minutes of exercise that takes a medium level of effort (moderate-intensity exercise). ? This is exercise that:  Makes your heart beat faster and makes you breathe harder than usual.  Allows you to still be able to talk. ? You could exercise in short sessions several times a day or longer sessions a few times a week. For example, on 5 days each week, you could walk fast or ride your bike 3 times a day for 10 minutes each time.  Do exercises  as told by your health care provider. Medicines  In addition to diet and lifestyle changes, your health care provider may recommend medicines to help lower cholesterol. This may be a medicine to lower the amount of cholesterol your liver makes. You may need medicine if: ? Diet and lifestyle changes do not lower your cholesterol enough. ? You have high cholesterol and other risk factors for heart disease or stroke.  Take over-the-counter and prescription medicines only as told by your health care provider. General information  Manage your risk factors for high cholesterol. Talk with your health care provider about all your risk factors and how to lower your risk.  Manage other conditions that you have, such as diabetes or high blood pressure (hypertension).  Have blood tests to check your cholesterol levels at regular points in time as told by your health care provider.  Keep all follow-up visits as told by your health care provider. This is important. Where to find more information  American Heart Association: www.heart.org  National Heart, Lung, and Blood Institute: https://wilson-eaton.com/ Summary  High cholesterol increases your risk for heart disease and stroke. By keeping your cholesterol level low, you can reduce your risk for these conditions.  High cholesterol can often be prevented with diet and lifestyle changes.  Work with your health care provider to manage your risk factors, and have your blood tested regularly. This information is not intended to replace advice given to you by your health care provider. Make sure you discuss any questions you have with your health care provider. Document Revised: 06/13/2018 Document Reviewed: 10/29/2015 Elsevier Patient Education  2020 [employer].

## 2019-12-18 ENCOUNTER — Telehealth: Payer: Self-pay | Admitting: Family Medicine

## 2019-12-18 DIAGNOSIS — Z23 Encounter for immunization: Secondary | ICD-10-CM | POA: Diagnosis not present

## 2019-12-18 DIAGNOSIS — R351 Nocturia: Secondary | ICD-10-CM | POA: Diagnosis not present

## 2019-12-18 DIAGNOSIS — N401 Enlarged prostate with lower urinary tract symptoms: Secondary | ICD-10-CM | POA: Diagnosis not present

## 2019-12-18 DIAGNOSIS — R972 Elevated prostate specific antigen [PSA]: Secondary | ICD-10-CM | POA: Diagnosis not present

## 2019-12-18 DIAGNOSIS — N5201 Erectile dysfunction due to arterial insufficiency: Secondary | ICD-10-CM | POA: Diagnosis not present

## 2019-12-23 ENCOUNTER — Other Ambulatory Visit: Payer: Self-pay

## 2019-12-23 ENCOUNTER — Ambulatory Visit (INDEPENDENT_AMBULATORY_CARE_PROVIDER_SITE_OTHER): Payer: Medicare Other

## 2019-12-23 DIAGNOSIS — Z0000 Encounter for general adult medical examination without abnormal findings: Secondary | ICD-10-CM | POA: Diagnosis not present

## 2019-12-23 NOTE — Progress Notes (Signed)
Subjective:   Luke [name] is a 74 y.o. male who presents for Medicare Annual/Subsequent preventive examination.  I connected with Luke [name] today by telephone and verified that I am speaking with the correct person using two identifiers. Location patient: home Location provider: work Persons participating in the virtual visit: patient, provider.   I discussed the limitations, risks, security and privacy concerns of performing an evaluation and management service by telephone and the availability of in person appointments. I also discussed with the patient that there may be a patient responsible charge related to this service. The patient expressed understanding and verbally consented to this telephonic visit.    Interactive audio and video telecommunications were attempted between this provider and patient, however failed, due to patient having technical difficulties OR patient did not have access to video capability.  We continued and completed visit with audio only.      Review of Systems    N/A  Cardiac Risk Factors include: advanced age (>52men, >17 women);male gender;hypertension;dyslipidemia     Objective:    Today's Vitals   There is no height or weight on file to calculate BMI.  Advanced Directives 12/23/2019 04/20/2018 09/25/2016 09/25/2016 02/17/2016 06/09/2015  Does Patient Have a Medical Advance Directive? No No No No No No  Would patient like information on creating a medical advance directive? No - Patient declined Yes (ED - Information included in AVS) No - Patient declined - - No - patient declined information    Current Medications (verified) Outpatient Encounter Medications as of 12/23/2019  Medication Sig  . acyclovir (ZOVIRAX) 200 MG capsule TAKE 1 CAPSULE DAILY (Patient taking differently: Take 200 mg by mouth. TAKE 1 CAPSULE EVERY OTHER DAY)  . amLODipine (NORVASC) 10 MG tablet Take 1 tablet (10 mg total) by mouth daily.  Marland Kitchen apixaban (ELIQUIS) 5 MG  TABS tablet Take 1 tablet (5 mg total) by mouth 2 (two) times daily for 30 days.  Marland Kitchen atorvastatin (LIPITOR) 10 MG tablet Take 1 tablet (10 mg total) by mouth at bedtime.  . carvedilol (COREG) 6.25 MG tablet Take 1 tablet (6.25 mg total) by mouth 2 (two) times daily with a meal.  . losartan (COZAAR) 100 MG tablet Take 1 tablet (100 mg total) by mouth daily.  Marland Kitchen omeprazole (PRILOSEC) 20 MG capsule Take 1 capsule (20 mg total) by mouth daily.  . sildenafil (VIAGRA) 50 MG tablet Take 1 tablet (50 mg total) by mouth as needed. (Patient taking differently: Take 50 mg by mouth as needed for erectile dysfunction. )  . acyclovir ointment (ZOVIRAX) 5 % Apply 1 application topically every 3 (three) hours. (Patient not taking: Reported on 12/14/2019)  . albuterol (PROVENTIL HFA;VENTOLIN HFA) 108 (90 Base) MCG/ACT inhaler Inhale 2 puffs into the lungs every 6 (six) hours as needed for wheezing or shortness of breath. (Patient not taking: Reported on 12/14/2019)  . cyclobenzaprine (FLEXERIL) 10 MG tablet Take 1 tablet (10 mg total) by mouth 3 (three) times daily as needed for muscle spasms. (Patient not taking: Reported on 12/23/2019)  . metaxalone (SKELAXIN) 800 MG tablet Take 1 tablet (800 mg total) by mouth 2 (two) times daily as needed for muscle spasms. (Patient not taking: Reported on 12/14/2019)  . [DISCONTINUED] cephALEXin (KEFLEX) 500 MG capsule Take 1 capsule (500 mg total) by mouth 3 (three) times daily.  . [DISCONTINUED] doxycycline (VIBRA-TABS) 100 MG tablet Take 1 tablet (100 mg total) by mouth 2 (two) times daily.  . [DISCONTINUED] methylPREDNISolone (MEDROL DOSEPAK) 4 MG  TBPK tablet As directed  . [DISCONTINUED] neomycin-polymyxin-hydrocortisone (CORTISPORIN) OTIC solution Apply 1-2 drops to toe after soaking twice a day  . [DISCONTINUED] triamcinolone cream (KENALOG) 0.1 % Apply 1 application topically 2 (two) times daily. (Patient taking differently: Apply 1 application topically 2 (two) times daily  as needed (rash). )   No facility-administered encounter medications on file as of 12/23/2019.    Allergies (verified) Aspirin, Hydrochlorothiazide, Irbesartan, and Symbicort [budesonide-formoterol fumarate]   History: Past Medical History:  Diagnosis Date  . Asthma   . Depression   . ED (erectile dysfunction)   . Hyperlipidemia   . Hypertension   . Paroxysmal atrial fibrillation ([place]) 09/2016   Initial diagnosis was in setting of acute URI/asthma attack  . Solitary pulmonary nodule 04/16/2013   03/30/13  CXR Question left nipple shadow.  10 mm ovoid nodular density right upper lobe, cannot exclude  pulmonary mass/ nodule > f/u cxr 04/29/2013 > no nodule    . Stroke ([place])   . Ulcer    Past Surgical History:  Procedure Laterality Date  . COLONOSCOPY  04/27/2014   per Dr. Wilford Corner, repeat in 5 yrs   . NM MYOVIEW LTD  10/2016   ailable, images/films reviewed: From Epic Chart or Care Everywhere)  . TRANSTHORACIC ECHOCARDIOGRAM  35/0093   Normal systolic function. EF 5560%. No RWMA. Gr 1 DD. Mild AoV calcification - no stenosis. Atrial Septum - lipomatous hypertrophy. --Essentially normal.   Family History  Problem Relation Age of Onset  . Stroke Mother   . Hypertension Mother   . Peripheral vascular disease Mother   . Hyperlipidemia Father   . Coronary artery disease Father   . Peripheral vascular disease Father   . Hypertension Brother   . Coronary artery disease Brother   . Cancer Brother        throat, smoked  . Emphysema Brother        smoked  . Lung cancer Brother        smoked   Social History   Socioeconomic History  . Marital status: Married    Spouse name: Not on file  . Number of children: Not on file  . Years of education: Not on file  . Highest education level: Not on file  Occupational History  . Occupation: Retired Product manager: RETIRED  Tobacco Use  . Smoking status: Never Smoker  . Smokeless tobacco: Never Used  Substance and  Sexual Activity  . Alcohol use: Yes    Alcohol/week: 2.0 standard drinks    Types: 2 Standard drinks or equivalent per week    Comment: socially occasionally   . Drug use: No  . Sexual activity: Not on file  Other Topics Concern  . Not on file  Social History Narrative  . Not on file   Social Determinants of Health   Financial Resource Strain: Low Risk   . Difficulty of Paying Living Expenses: Not hard at all  Food Insecurity: No Food Insecurity  . Worried About Charity fundraiser in the Last Year: Never true  . Ran Out of Food in the Last Year: Never true  Transportation Needs: No Transportation Needs  . Lack of Transportation (Medical): No  . Lack of Transportation (Non-Medical): No  Physical Activity: Insufficiently Active  . Days of Exercise per Week: 2 days  . Minutes of Exercise per Session: 60 min  Stress: No Stress Concern Present  . Feeling of Stress : Not at all  Social Connections:  Moderately Integrated  . Frequency of Communication with Friends and Family: More than three times a week  . Frequency of Social Gatherings with Friends and Family: Once a week  . Attends Religious Services: More than 4 times per year  . Active Member of Clubs or Organizations: No  . Attends Archivist Meetings: Never  . Marital Status: Married    Tobacco Counseling Counseling given: Not Answered   Clinical Intake:  Pre-visit preparation completed: Yes  Pain : No/denies pain     Nutritional Risks: None Diabetes: No  How often do you need to have someone help you when you read instructions, pamphlets, or other written materials from your doctor or pharmacy?: 1 - Never What is the last grade level you completed in school?: Graduate School  Diabetic?No  Interpreter Needed?: No  Information entered by :: [place] of Daily Living In your present state of health, do you have any difficulty performing the following activities: 12/23/2019  Hearing?  Y  Comment Has some issues with ringing in ears and has some pitch issues  Vision? N  Difficulty concentrating or making decisions? N  Walking or climbing stairs? N  Dressing or bathing? N  Doing errands, shopping? N  Preparing Food and eating ? N  Using the Toilet? N  In the past six months, have you accidently leaked urine? N  Do you have problems with loss of bowel control? N  Managing your Medications? N  Managing your Finances? N  Housekeeping or managing your Housekeeping? N  Some recent data might be hidden    Patient Care Team: Laurey Morale, MD as PCP - General (Family Medicine) Minus Breeding, MD as PCP - Cardiology (Cardiology) Viona Gilmore, [facility] as Pharmacist (Pharmacist)  Indicate any recent Medical Services you may have received from other than [facility] providers in the past year (date may be approximate).     Assessment:   This is a routine wellness examination for [place].  Hearing/Vision screen  Hearing Screening   125Hz  250Hz  500Hz  1000Hz  2000Hz  3000Hz  4000Hz  6000Hz  8000Hz   Right ear:           Left ear:           Vision Screening Comments: Patient states gets eyes examined once per year  Dietary issues and exercise activities discussed: Current Exercise Habits: Home exercise routine, Type of exercise: treadmill;strength training/weights, Time (Minutes): 60, Frequency (Times/Week): 2, Weekly Exercise (Minutes/Week): 120, Intensity: Mild, Exercise limited by: None identified  Goals    . Exercise 150 min/wk Moderate Activity    . Pharmacy Care Plan     CARE PLAN ENTRY (see longitudinal plan of care for additional care plan information)  Current Barriers:  . Chronic Disease Management support, education, and care coordination needs related to Hypertension, Hyperlipidemia, Atrial Fibrillation, and GERD   Hypertension BP Readings from Last 3 Encounters:  12/07/19 120/70  07/24/19 138/66  03/19/19 120/62   . Pharmacist Clinical Goal(s): o Over the  next 90 days, patient will work with PharmD and providers to maintain BP goal <130/80 . Current regimen:  . Amlodipine 10 mg 1 tablet daily  . Losartan 100 mg 1 tablet daily  . Carvedilol 6.25 mg 1 tablet twice daily . Interventions: o We discussed taking carvedilol with food to minimize possible side effects and slow absorption o We discussed the importance of monitoring blood pressure at home regularly o We discussed maintaining a low salt diet and increasing exercise in order to  help manage high blood pressure . Patient self care activities - Over the next 90 days, patient will: o Check blood pressure weekly, document, and provide at future appointments o Ensure daily salt intake < 2300 mg/day o Take carvedilol with food or around a mealtime  Hyperlipidemia Lab Results  Component Value Date/Time   LDLCALC 71 03/12/2019 09:14 AM   LDLDIRECT 73.0 02/08/2015 09:06 AM   . Pharmacist Clinical Goal(s): o Over the next 90 days, patient will work with PharmD and providers to achieve LDL goal < 70 . Current regimen:  o Atorvastatin 10 mg 1 tablet at bedtime . Interventions: o We discussed increasing exercise in order to help lower cholesterol o We discussed incorporating 10-25 g of fiber in diet to help lower cholesterol through foods such as green leafy vegetables, fruit, and oatmeal . Patient self care activities - Over the next 90 days, patient will: o Continue current medications  A fib . Pharmacist Clinical Goal(s): o Over the next 90 days, patient will work with PharmD and providers to maintain heart rate between 60-100 beats per minute . Current regimen:  . Eliquis 5 mg 1 tablet twice daily . Carvedilol 6.25 mg 1 tablet twice daily . Interventions: o We discussed monitoring for bleeding such as blood in urine or stool, nosebleeds, or unexpected bruising . Patient self care activities - Over the next 90 days, patient will: o Check heart rate weekly along with blood  pressure o Contact provider with any episodes of bleeding  GERD . Pharmacist Clinical Goal(s) o Over the next 90 days, patient will work with PharmD and providers to maintain symptoms of indigestions/heartburn . Current regimen:  o Omeprazole 20 mg 1 capsule every other day . Interventions: o We discussed trying Tums over the counter as needed for relief of symptoms o We discussed other ways to manage symptoms such as avoiding food 2 hours before bedtime, avoiding spicy/acidic/fatty foods, and elevating head of bed . Patient self care activities - Over the next 90 days, patient will: o Continue current medications o Can use Tums as needed for symptoms in addition to omeprazole  Medication management . Pharmacist Clinical Goal(s): o Over the next 90 days, patient will work with PharmD and providers to maintain optimal medication adherence . Current pharmacy: [facility] . Interventions o Comprehensive medication review performed. o Continue current medication management strategy . Patient self care activities - Over the next 90 days, patient will: o Take medications as prescribed o Report any questions or concerns to PharmD and/or provider(s)  Initial goal documentation     . Weight (lb) < 180 lb (81.6 kg)     Will try to lose 10 lbs; will try to eat less sugar      Depression Screen PHQ 2/9 Scores 12/23/2019 02/24/2018 02/21/2017 02/17/2016 08/02/2015 01/06/2015 07/16/2013  PHQ - 2 Score 0 0 0 0 0 0 0  PHQ- 9 Score 0 - - - - - -    Fall Risk Fall Risk  12/23/2019 02/24/2018 02/21/2017 02/17/2016 02/17/2016  Falls in the past year? 0 0 No No No  Number falls in past yr: 0 - - - -  Injury with Fall? 0 - - - -  Comment - - - - -  Follow up Falls evaluation completed;Falls prevention discussed - - - -    Any stairs in or around the home? Yes  If so, are there any without handrails? No  Home free of loose throw rugs in walkways,  pet beds, electrical cords, etc? Yes   Adequate lighting in your home to reduce risk of falls? Yes   ASSISTIVE DEVICES UTILIZED TO PREVENT FALLS:  Life alert? No  Use of a cane, walker or w/c? No  Grab bars in the bathroom? No  Shower chair or bench in shower? No  Elevated toilet seat or a handicapped toilet? Yes    Cognitive Function: Cognition within normal limits. Screening indicated  MMSE - Mini Mental State Exam 02/17/2016  Not completed: (No Data)        Immunizations Immunization History  Administered Date(s) Administered  . Fluad Quad(high Dose 65+) 11/11/2018  . Influenza Split 12/18/2010, 01/04/2012  . Influenza Whole 12/20/2009  . Influenza, High Dose Seasonal PF 12/17/2012, 12/13/2015, 11/26/2016, 12/06/2017, 12/18/2019  . Influenza,inj,Quad PF,6+ Mos 11/12/2013, 01/06/2015  . PFIZER SARS-COV-2 Vaccination 03/25/2019, 04/15/2019  . Pneumococcal Conjugate-13 11/12/2013  . Pneumococcal Polysaccharide-23 06/13/2011  . Td 04/23/2003  . Tdap 11/27/2016  . Zoster 01/16/2013    TDAP status: Up to date Flu Vaccine status: Completed at today's visit Pneumococcal vaccine status: Up to date Covid-19 vaccine status: Completed vaccines  Qualifies for Shingles Vaccine? Yes   Zostavax completed Yes   Shingrix Completed?: No.    Education has been provided regarding the importance of this vaccine. Patient has been advised to call insurance company to determine out of pocket expense if they have not yet received this vaccine. Advised may also receive vaccine at local pharmacy or Health Dept. Verbalized acceptance and understanding.  Screening Tests Health Maintenance  Topic Date Due  . COLONOSCOPY  05/14/2024  . TETANUS/TDAP  11/28/2026  . INFLUENZA VACCINE  Completed  . COVID-19 Vaccine  Completed  . Hepatitis C Screening  Completed  . PNA vac Low Risk Adult  Completed    Health Maintenance  There are no preventive care reminders to display for this patient.  Colorectal cancer screening: Completed  05/15/2019. Repeat every 5 years  Lung Cancer Screening: (Low Dose CT Chest recommended if Age 50-80 years, 30 pack-year currently smoking OR have quit w/in 15years.) does not qualify.   Lung Cancer Screening Referral: N/A  Additional Screening:  Hepatitis C Screening: does qualify; Completed 02/17/2016  Vision Screening: Recommended annual ophthalmology exams for early detection of glaucoma and other disorders of the eye. Is the patient up to date with their annual eye exam?  Yes  Who is the provider or what is the name of the office in which the patient attends annual eye exams? Dr.Stacey Hutto If pt is not established with a provider, would they like to be referred to a provider to establish care? No .   Dental Screening: Recommended annual dental exams for proper oral hygiene  Community Resource Referral / Chronic Care Management: CRR required this visit?  No   CCM required this visit?  No      Plan:     I have personally reviewed and noted the following in the patient's chart:   . Medical and social history . Use of alcohol, tobacco or illicit drugs  . Current medications and supplements . Functional ability and status . Nutritional status . Physical activity . Advanced directives . List of other physicians . Hospitalizations, surgeries, and ER visits in previous 12 months . Vitals . Screenings to include cognitive, depression, and falls . Referrals and appointments  In addition, I have reviewed and discussed with patient certain preventive protocols, quality metrics, and best practice recommendations. A written personalized care plan for preventive  services as well as general preventive health recommendations were provided to patient.     Ofilia Neas, LPN   24/93/2419   Nurse Notes: None

## 2019-12-23 NOTE — Patient Instructions (Signed)
Luke [name] , Thank you for taking time to come for your Medicare Wellness Visit. I appreciate your ongoing commitment to your health goals. Please review the following plan we discussed and let me know if I can assist you in the future.   Screening recommendations/referrals: Colonoscopy: Up to date, next due 05/14/2024 Recommended yearly ophthalmology/optometry visit for glaucoma screening and checkup Recommended yearly dental visit for hygiene and checkup  Vaccinations: Influenza vaccine: Up to date, next due fall 2022 Pneumococcal vaccine: Completed series Tdap vaccine: Up to date, next due 11/27/2016 Shingles vaccine: Currently due for Shingrix, if you wish to receive we recommend getting it at your pharmacy.    Advanced directives: Advance directive discussed with you today. Even though you declined this today please call our office should you change your mind and we can give you the proper paperwork for you to fill out.   Conditions/risks identified: None   Next appointment: 03/14/2020 @ 11:00 am with Dr. Sarajane Jews   Preventive Care 65 Years and Older, Male Preventive care refers to lifestyle choices and visits with your health care provider that can promote health and wellness. What does preventive care include?  A yearly physical exam. This is also called an annual well check.  Dental exams once or twice a year.  Routine eye exams. Ask your health care provider how often you should have your eyes checked.  Personal lifestyle choices, including:  Daily care of your teeth and gums.  Regular physical activity.  Eating a healthy diet.  Avoiding tobacco and drug use.  Limiting alcohol use.  Practicing safe sex.  Taking low doses of aspirin every day.  Taking vitamin and mineral supplements as recommended by your health care provider. What happens during an annual well check? The services and screenings done by your health care provider during your annual well check will  depend on your age, overall health, lifestyle risk factors, and family history of disease. Counseling  Your health care provider may ask you questions about your:  Alcohol use.  Tobacco use.  Drug use.  Emotional well-being.  Home and relationship well-being.  Sexual activity.  Eating habits.  History of falls.  Memory and ability to understand (cognition).  Work and work Statistician. Screening  You may have the following tests or measurements:  Height, weight, and BMI.  Blood pressure.  Lipid and cholesterol levels. These may be checked every 5 years, or more frequently if you are over 36 years old.  Skin check.  Lung cancer screening. You may have this screening every year starting at age 63 if you have a 30-pack-year history of smoking and currently smoke or have quit within the past 15 years.  Fecal occult blood test (FOBT) of the stool. You may have this test every year starting at age 30.  Flexible sigmoidoscopy or colonoscopy. You may have a sigmoidoscopy every 5 years or a colonoscopy every 10 years starting at age 41.  Prostate cancer screening. Recommendations will vary depending on your family history and other risks.  Hepatitis C blood test.  Hepatitis B blood test.  Sexually transmitted disease (STD) testing.  Diabetes screening. This is done by checking your blood sugar (glucose) after you have not eaten for a while (fasting). You may have this done every 1-3 years.  Abdominal aortic aneurysm (AAA) screening. You may need this if you are a current or former smoker.  Osteoporosis. You may be screened starting at age 47 if you are at high risk. Talk with  your health care provider about your test results, treatment options, and if necessary, the need for more tests. Vaccines  Your health care provider may recommend certain vaccines, such as:  Influenza vaccine. This is recommended every year.  Tetanus, diphtheria, and acellular pertussis (Tdap,  Td) vaccine. You may need a Td booster every 10 years.  Zoster vaccine. You may need this after age 48.  Pneumococcal 13-valent conjugate (PCV13) vaccine. One dose is recommended after age 71.  Pneumococcal polysaccharide (PPSV23) vaccine. One dose is recommended after age 72. Talk to your health care provider about which screenings and vaccines you need and how often you need them. This information is not intended to replace advice given to you by your health care provider. Make sure you discuss any questions you have with your health care provider. Document Released: 03/18/2015 Document Revised: 11/09/2015 Document Reviewed: 12/21/2014 Elsevier Interactive Patient Education  2017 [employer] Prevention in the Home Falls can cause injuries. They can happen to people of all ages. There are many things you can do to make your home safe and to help prevent falls. What can I do on the outside of my home?  Regularly fix the edges of walkways and driveways and fix any cracks.  Remove anything that might make you trip as you walk through a door, such as a raised step or threshold.  Trim any bushes or trees on the path to your home.  Use bright outdoor lighting.  Clear any walking paths of anything that might make someone trip, such as rocks or tools.  Regularly check to see if handrails are loose or broken. Make sure that both sides of any steps have handrails.  Any raised decks and porches should have guardrails on the edges.  Have any leaves, snow, or ice cleared regularly.  Use sand or salt on walking paths during winter.  Clean up any spills in your garage right away. This includes oil or grease spills. What can I do in the bathroom?  Use night lights.  Install grab bars by the toilet and in the tub and shower. Do not use towel bars as grab bars.  Use non-skid mats or decals in the tub or shower.  If you need to sit down in the shower, use a plastic, non-slip  stool.  Keep the floor dry. Clean up any water that spills on the floor as soon as it happens.  Remove soap buildup in the tub or shower regularly.  Attach bath mats securely with double-sided non-slip rug tape.  Do not have throw rugs and other things on the floor that can make you trip. What can I do in the bedroom?  Use night lights.  Make sure that you have a light by your bed that is easy to reach.  Do not use any sheets or blankets that are too big for your bed. They should not hang down onto the floor.  Have a firm chair that has side arms. You can use this for support while you get dressed.  Do not have throw rugs and other things on the floor that can make you trip. What can I do in the kitchen?  Clean up any spills right away.  Avoid walking on wet floors.  Keep items that you use a lot in easy-to-reach places.  If you need to reach something above you, use a strong step stool that has a grab bar.  Keep electrical cords out of the way.  Do not  use floor polish or wax that makes floors slippery. If you must use wax, use non-skid floor wax.  Do not have throw rugs and other things on the floor that can make you trip. What can I do with my stairs?  Do not leave any items on the stairs.  Make sure that there are handrails on both sides of the stairs and use them. Fix handrails that are broken or loose. Make sure that handrails are as long as the stairways.  Check any carpeting to make sure that it is firmly attached to the stairs. Fix any carpet that is loose or worn.  Avoid having throw rugs at the top or bottom of the stairs. If you do have throw rugs, attach them to the floor with carpet tape.  Make sure that you have a light switch at the top of the stairs and the bottom of the stairs. If you do not have them, ask someone to add them for you. What else can I do to help prevent falls?  Wear shoes that:  Do not have high heels.  Have rubber bottoms.  Are  comfortable and fit you well.  Are closed at the toe. Do not wear sandals.  If you use a stepladder:  Make sure that it is fully opened. Do not climb a closed stepladder.  Make sure that both sides of the stepladder are locked into place.  Ask someone to hold it for you, if possible.  Clearly mark and make sure that you can see:  Any grab bars or handrails.  First and last steps.  Where the edge of each step is.  Use tools that help you move around (mobility aids) if they are needed. These include:  Canes.  Walkers.  Scooters.  Crutches.  Turn on the lights when you go into a dark area. Replace any light bulbs as soon as they burn out.  Set up your furniture so you have a clear path. Avoid moving your furniture around.  If any of your floors are uneven, fix them.  If there are any pets around you, be aware of where they are.  Review your medicines with your doctor. Some medicines can make you feel dizzy. This can increase your chance of falling. Ask your doctor what other things that you can do to help prevent falls. This information is not intended to replace advice given to you by your health care provider. Make sure you discuss any questions you have with your health care provider. Document Released: 12/16/2008 Document Revised: 07/28/2015 Document Reviewed: 03/26/2014 Elsevier Interactive Patient Education  2017 [employer].

## 2020-01-11 ENCOUNTER — Other Ambulatory Visit: Payer: Self-pay | Admitting: Family Medicine

## 2020-01-25 ENCOUNTER — Emergency Department (HOSPITAL_COMMUNITY): Payer: Medicare Other

## 2020-01-25 ENCOUNTER — Emergency Department (HOSPITAL_COMMUNITY)
Admission: EM | Admit: 2020-01-25 | Discharge: 2020-01-25 | Disposition: A | Discharge status: Home / Self Care | Payer: Medicare Other | Attending: Emergency Medicine | Admitting: Emergency Medicine

## 2020-01-25 ENCOUNTER — Other Ambulatory Visit: Payer: Self-pay

## 2020-01-25 ENCOUNTER — Encounter (HOSPITAL_COMMUNITY): Payer: Self-pay | Admitting: Pediatrics

## 2020-01-25 DIAGNOSIS — Z5321 Procedure and treatment not carried out due to patient leaving prior to being seen by health care provider: Secondary | ICD-10-CM | POA: Insufficient documentation

## 2020-01-25 DIAGNOSIS — R000 Tachycardia, unspecified: Secondary | ICD-10-CM | POA: Insufficient documentation

## 2020-01-25 DIAGNOSIS — I4891 Unspecified atrial fibrillation: Secondary | ICD-10-CM | POA: Diagnosis not present

## 2020-01-25 DIAGNOSIS — J9811 Atelectasis: Secondary | ICD-10-CM | POA: Diagnosis not present

## 2020-01-25 LAB — CBC
HCT: 44.4 % (ref 39.0–52.0)
Hemoglobin: 14.4 g/dL (ref 13.0–17.0)
MCH: 28.2 pg (ref 26.0–34.0)
MCHC: 32.4 g/dL (ref 30.0–36.0)
MCV: 87.1 fL (ref 80.0–100.0)
Platelets: 335 10*3/uL (ref 150–400)
RBC: 5.1 MIL/uL (ref 4.22–5.81)
RDW: 13.6 % (ref 11.5–15.5)
WBC: 20.7 10*3/uL — ABNORMAL HIGH (ref 4.0–10.5)
nRBC: 0 % (ref 0.0–0.2)

## 2020-01-25 LAB — BASIC METABOLIC PANEL
Anion gap: 11 (ref 5–15)
BUN: 16 mg/dL (ref 8–23)
CO2: 25 mmol/L (ref 22–32)
Calcium: 8.8 mg/dL — ABNORMAL LOW (ref 8.9–10.3)
Chloride: 103 mmol/L (ref 98–111)
Creatinine, Ser: 1.17 mg/dL (ref 0.61–1.24)
GFR, Estimated: >60 mL/min (ref >60)
Glucose, Bld: 122 mg/dL — ABNORMAL HIGH (ref 70–99)
Potassium: 3.9 mmol/L (ref 3.5–5.1)
Sodium: 139 mmol/L (ref 135–145)

## 2020-01-25 NOTE — ED Triage Notes (Signed)
C/O increased heart rate; reported he has a machine at home that showed Afib. He has a known hx and is on blood thinners and carvedilol.

## 2020-01-25 NOTE — ED Notes (Signed)
Lwbs. 

## 2020-02-04 ENCOUNTER — Other Ambulatory Visit: Payer: Self-pay

## 2020-02-04 ENCOUNTER — Encounter: Payer: Self-pay | Admitting: Family Medicine

## 2020-02-04 ENCOUNTER — Ambulatory Visit (INDEPENDENT_AMBULATORY_CARE_PROVIDER_SITE_OTHER): Payer: Medicare Other | Admitting: Family Medicine

## 2020-02-04 VITALS — BP 135/74 | HR 58 | Ht 67.0 in | Wt 181.0 lb

## 2020-02-04 DIAGNOSIS — I6523 Occlusion and stenosis of bilateral carotid arteries: Secondary | ICD-10-CM | POA: Diagnosis not present

## 2020-02-04 DIAGNOSIS — I480 Paroxysmal atrial fibrillation: Secondary | ICD-10-CM | POA: Diagnosis not present

## 2020-02-04 DIAGNOSIS — I10 Essential (primary) hypertension: Secondary | ICD-10-CM

## 2020-02-04 MED ORDER — DILTIAZEM HCL 30 MG PO TABS: ORAL_TABLET | ORAL | 0 refills | Status: DC

## 2020-02-04 NOTE — Progress Notes (Addendum)
   Subjective:    Patient ID: Luke [name], male    DOB: 1946-01-31, 74 y.o.   MRN: [id number]  HPI Here to follow up an ER visit on 01-25-20 for a bout of atrial fibrillation with RVR. He has paroxysmal atrial fib,and as far as he knows he has not had about of this for many months. Then the evening of the ER visit he began to feel lightheaded, and he felt his heart racing. No chest pain or SOB. He went to an urgent care clinic and they immediately sent him on to [facility] ER. An EKG there confirmed atrial fibrillation with a ventricular rate of 142. Otherwise he was stable. Labs were normal except for a WBC of 20.7. The CXR was clear. After a total of about 5 hours the fibrillation stopped and he decided to leave without seeing a provider, and he went home. Since then he has fetl fine with no further episodes. There have been no recent changes in his medications or in his use of alcohol or caffeine.    Review of Systems  Constitutional: Negative.   Respiratory: Negative.   Cardiovascular: Positive for palpitations. Negative for chest pain and leg swelling.  Genitourinary: Negative for testicular pain.       Objective:   Physical Exam Constitutional:      Appearance: Normal appearance. He is not ill-appearing.  Cardiovascular:     Rate and Rhythm: Normal rate and regular rhythm.     Pulses: Normal pulses.     Heart sounds: Normal heart sounds.  Pulmonary:     Effort: Pulmonary effort is normal.     Breath sounds: Normal breath sounds.  Musculoskeletal:     Comments: 1+ edema in both lower legs   Neurological:     Mental Status: He is alert.           Assessment & Plan:  Paroxysmal atrial fibrillation. We will give him some Diltiazem 30 mg capsules to take as needed for heart rates over 100. He will see his cardiologist, Dr. Percival Spanish, on January 4. Of note he saw the [facility] clinic on 01-27-20, and his WBc was back to normal at 7.70.  [name], MD

## 2020-02-17 NOTE — Progress Notes (Signed)
Cardiology Office Note   Date:  02/19/2020   ID:  Luke, [name] 08/15/1945, MRN [id number]  PCP:  Laurey Morale, MD  Cardiologist:   Minus Breeding, MD   Chief Complaint  Patient presents with  . Atrial Fibrillation      History of Present Illness: Luke [name] is a 74 y.o. male who presents for follow up of atrial fib.    He was found to have atrial fib in the context of a URI.  He had a negative ischemia work up and unremarkable echo  He did not want to continue with the [place].   He was in the hospital with a CVA.   This appeared to be embolic. He was subsequently put on Eliquis.    Since I last saw him he was in the emergency room in late November with atrial fibrillation.  He left without being seen.  He probably was in fibrillation for about 10 hours and I did see an EKG which confirmed this.  While he was waiting in the emergency room he went back into sinus rhythm.  He felt his heart racing.  He did not have any presyncope or syncope.  He did not have any chest pressure, neck or arm discomfort.  He thinks he had about 2 or 3 of these episodes probably not lasting as long but protracted over 18 months.  He saw his primary care physician who gave him Cardizem to take as needed but he has not had yet to use this.  He cannot identify a trigger.  He does not have any other cardiovascular complaints such as chest discomfort, neck or arm discomfort.  Has had no weight gain or edema.    Past Medical History:  Diagnosis Date  . Asthma   . Depression   . ED (erectile dysfunction)   . Hyperlipidemia   . Hypertension   . Paroxysmal atrial fibrillation ([place]) 09/2016   Initial diagnosis was in setting of acute URI/asthma attack  . Solitary pulmonary nodule 04/16/2013   03/30/13  CXR Question left nipple shadow.  10 mm ovoid nodular density right upper lobe, cannot exclude  pulmonary mass/ nodule > f/u cxr 04/29/2013 > no nodule    . Stroke ([place])   . Ulcer     Past  Surgical History:  Procedure Laterality Date  . COLONOSCOPY  04/27/2014   per Dr. Wilford Corner, repeat in 5 yrs   . NM MYOVIEW LTD  10/2016   ailable, images/films reviewed: From Epic Chart or Care Everywhere)  . TRANSTHORACIC ECHOCARDIOGRAM  32/9518   Normal systolic function. EF 5560%. No RWMA. Gr 1 DD. Mild AoV calcification - no stenosis. Atrial Septum - lipomatous hypertrophy. --Essentially normal.     Current Outpatient Medications  Medication Sig Dispense Refill  . albuterol (VENTOLIN HFA) 108 (90 Base) MCG/ACT inhaler 1 puff    . amLODipine (NORVASC) 10 MG tablet Take 1 tablet (10 mg total) by mouth daily. 90 tablet 3  . apixaban (ELIQUIS) 5 MG TABS tablet Take 1 tablet (5 mg total) by mouth 2 (two) times daily for 30 days. 60 tablet 0  . atorvastatin (LIPITOR) 10 MG tablet Take 1 tablet (10 mg total) by mouth at bedtime. 90 tablet 3  . carvedilol (COREG) 6.25 MG tablet Take 1 tablet (6.25 mg total) by mouth 2 (two) times daily with a meal. 180 tablet 3  . diltiazem (CARDIZEM) 30 MG tablet Take one tablet as needed for heart rates  greater than 100 30 tablet 0  . famotidine (PEPCID) 10 MG tablet 1 tablet as needed    . losartan (COZAAR) 100 MG tablet TAKE 1 TABLET DAILY 90 tablet 3  . omeprazole (PRILOSEC) 20 MG capsule Take 1 capsule (20 mg total) by mouth daily. 90 capsule 3  . sildenafil (VIAGRA) 50 MG tablet Take 1 tablet (50 mg total) by mouth as needed. 10 tablet 11   No current facility-administered medications for this visit.    Allergies:   Aspirin, Hydrochlorothiazide, Irbesartan, and Symbicort [budesonide-formoterol fumarate]    ROS:  Please see the history of present illness.   Otherwise, review of systems are positive for none.   All other systems are reviewed and negative.    PHYSICAL EXAM: VS:  BP 130/72   Pulse (!) 53   Ht 5\' 7"  (1.702 m)   Wt 182 lb (82.6 kg)   SpO2 95%   BMI 28.51 kg/m  , BMI Body mass index is 28.51 kg/m. GENERAL:  Well  appearing NECK:  No jugular venous distention, waveform within normal limits, carotid upstroke brisk and symmetric, no bruits, no thyromegaly LUNGS:  Clear to auscultation bilaterally CHEST:  Unremarkable HEART:  PMI not displaced or sustained,S1 and S2 within normal limits, no S3, no S4, no clicks, no rubs, no murmurs ABD:  Flat, positive bowel sounds normal in frequency in pitch, no bruits, no rebound, no guarding, no midline pulsatile mass, no hepatomegaly, no splenomegaly EXT:  2 plus pulses throughout, no edema, no cyanosis no clubbing   EKG:  EKG is  ordered today. The ekg ordered today demonstrates sinus rhythm, rate 53, axis within normal limits, right bundle branch block, no acute ST-T wave changes, no change from previous.   Recent Labs: 03/12/2019: ALT 11; TSH 1.79 01/25/2020: BUN 16; Creatinine, Ser 1.17; Hemoglobin 14.4; Platelets 335; Potassium 3.9; Sodium 139    Lipid Panel    Component Value Date/Time   CHOL 134 03/12/2019 0914   TRIG 170.0 (H) 03/12/2019 0914   HDL 28.40 (L) 03/12/2019 0914   CHOLHDL 5 03/12/2019 0914   VLDL 34.0 03/12/2019 0914   LDLCALC 71 03/12/2019 0914   LDLDIRECT 73.0 02/08/2015 0906      Wt Readings from Last 3 Encounters:  02/19/20 182 lb (82.6 kg)  02/04/20 181 lb (82.1 kg)  01/25/20 176 lb (79.8 kg)      Other studies Reviewed: Additional studies/ records that were reviewed today include: ED records Review of the above records demonstrates:  Please see elsewhere in the note.     ASSESSMENT AND PLAN:  ATRIAL FIB:Mr.Luke Dennard Woodcockhas a CHA2DS2 - VASc score of 4.He tolerates anticoagulation.  He has had now about 2 or 3 episodes of sustained fibrillation.  At this point I would not suggest that he needs an antiarrhythmic.  Would be difficult to treat him with increased dose of beta-blocker or daily Cardizem so I think as needed Cardizem as was suggested by Dr. Barbie Banner is a good choice.  He and I talked about that at length.   If he is having to use this frequently I would likely need an antiarrhythmic.   HTN: The blood pressure isat target.  No change in therapy.   CAROTID DOPPLER:60 to 79% right carotid stenosis (60% on angiogram) and 40 to 59% left stenosis.   He actually now is having this followed at the [place].  DYSLIPDEMIA: LDL of 71 with an HDL of 28.  No change in therapy.   COVID  EDUCATION: He has had his vaccines.  Current medicines are reviewed at length with the patient today.  The patient does not have concerns regarding medicines.  The following changes have been made:  no change  Labs/ tests ordered today include: None  Orders Placed This Encounter  Procedures  . EKG 12-Lead     Disposition:   FU with me 6 months.   Signed, Minus Breeding, MD  02/19/2020 11:54 AM    [facility]

## 2020-02-19 ENCOUNTER — Encounter: Payer: Self-pay | Admitting: Cardiology

## 2020-02-19 ENCOUNTER — Ambulatory Visit (INDEPENDENT_AMBULATORY_CARE_PROVIDER_SITE_OTHER): Payer: Medicare Other | Admitting: Cardiology

## 2020-02-19 ENCOUNTER — Other Ambulatory Visit: Payer: Self-pay

## 2020-02-19 VITALS — BP 130/72 | HR 53 | Ht 67.0 in | Wt 182.0 lb

## 2020-02-19 DIAGNOSIS — I6523 Occlusion and stenosis of bilateral carotid arteries: Secondary | ICD-10-CM | POA: Diagnosis not present

## 2020-02-19 DIAGNOSIS — I480 Paroxysmal atrial fibrillation: Secondary | ICD-10-CM | POA: Diagnosis not present

## 2020-02-19 DIAGNOSIS — E785 Hyperlipidemia, unspecified: Secondary | ICD-10-CM | POA: Diagnosis not present

## 2020-02-19 DIAGNOSIS — I10 Essential (primary) hypertension: Secondary | ICD-10-CM | POA: Diagnosis not present

## 2020-02-19 NOTE — Patient Instructions (Addendum)
Medication Instructions:  No changes *If you need a refill on your cardiac medications before your next appointment, please call your pharmacy*  Lab Work: None ordered this visit  Testing/Procedures: None ordered this visit  Follow-Up: At [facility], you and your health needs are our priority.  As part of our continuing mission to provide you with exceptional heart care, we have created designated Provider Care Teams.  These Care Teams include your primary Cardiologist (physician) and Advanced Practice Providers (APPs -  Physician Assistants and Nurse Practitioners) who all work together to provide you with the care you need, when you need it.   Your next appointment:   6 months You will receive a reminder letter in the mail two months in advance. If you don't receive a letter, please call our office to schedule the follow-up appointment.  The format for your next appointment:   In Person  Provider:   Minus Breeding, MD

## 2020-02-26 ENCOUNTER — Other Ambulatory Visit: Payer: Self-pay | Admitting: Family Medicine

## 2020-03-08 ENCOUNTER — Ambulatory Visit: Payer: Medicare Other | Admitting: Cardiology

## 2020-03-14 ENCOUNTER — Encounter: Payer: Medicare Other | Admitting: Family Medicine

## 2020-03-15 ENCOUNTER — Ambulatory Visit (INDEPENDENT_AMBULATORY_CARE_PROVIDER_SITE_OTHER): Payer: Medicare Other | Admitting: Family Medicine

## 2020-03-15 ENCOUNTER — Encounter: Payer: Self-pay | Admitting: Family Medicine

## 2020-03-15 ENCOUNTER — Other Ambulatory Visit: Payer: Self-pay

## 2020-03-15 VITALS — BP 130/70 | HR 59 | Temp 98.2°F | Ht 67.0 in | Wt 181.4 lb

## 2020-03-15 DIAGNOSIS — E782 Mixed hyperlipidemia: Secondary | ICD-10-CM

## 2020-03-15 DIAGNOSIS — I10 Essential (primary) hypertension: Secondary | ICD-10-CM

## 2020-03-15 DIAGNOSIS — I480 Paroxysmal atrial fibrillation: Secondary | ICD-10-CM

## 2020-03-15 DIAGNOSIS — I6340 Cerebral infarction due to embolism of unspecified cerebral artery: Secondary | ICD-10-CM | POA: Diagnosis not present

## 2020-03-15 DIAGNOSIS — M5020 Other cervical disc displacement, unspecified cervical region: Secondary | ICD-10-CM

## 2020-03-15 DIAGNOSIS — N401 Enlarged prostate with lower urinary tract symptoms: Secondary | ICD-10-CM | POA: Diagnosis not present

## 2020-03-15 DIAGNOSIS — N138 Other obstructive and reflux uropathy: Secondary | ICD-10-CM

## 2020-03-15 DIAGNOSIS — R739 Hyperglycemia, unspecified: Secondary | ICD-10-CM

## 2020-03-15 DIAGNOSIS — K219 Gastro-esophageal reflux disease without esophagitis: Secondary | ICD-10-CM

## 2020-03-15 DIAGNOSIS — E785 Hyperlipidemia, unspecified: Secondary | ICD-10-CM

## 2020-03-15 DIAGNOSIS — I6523 Occlusion and stenosis of bilateral carotid arteries: Secondary | ICD-10-CM | POA: Diagnosis not present

## 2020-03-15 DIAGNOSIS — E042 Nontoxic multinodular goiter: Secondary | ICD-10-CM | POA: Diagnosis not present

## 2020-03-15 DIAGNOSIS — G2581 Restless legs syndrome: Secondary | ICD-10-CM

## 2020-03-15 LAB — CBC WITH DIFFERENTIAL/PLATELET
Basophils Absolute: 0.1 10*3/uL (ref 0.0–0.1)
Basophils Relative: 0.6 % (ref 0.0–3.0)
Eosinophils Absolute: 0.6 10*3/uL (ref 0.0–0.7)
Eosinophils Relative: 5.7 % — ABNORMAL HIGH (ref 0.0–5.0)
HCT: 41.5 % (ref 39.0–52.0)
Hemoglobin: 13.9 g/dL (ref 13.0–17.0)
Lymphocytes Relative: 23.2 % (ref 12.0–46.0)
Lymphs Abs: 2.5 10*3/uL (ref 0.7–4.0)
MCHC: 33.6 g/dL (ref 30.0–36.0)
MCV: 82.8 fl (ref 78.0–100.0)
Monocytes Absolute: 0.5 10*3/uL (ref 0.1–1.0)
Monocytes Relative: 5.1 % (ref 3.0–12.0)
Neutro Abs: 7 10*3/uL (ref 1.4–7.7)
Neutrophils Relative %: 65.4 % (ref 43.0–77.0)
Platelets: 310 10*3/uL (ref 150.0–400.0)
RBC: 5.01 Mil/uL (ref 4.22–5.81)
RDW: 14 % (ref 11.5–15.5)
WBC: 10.7 10*3/uL — ABNORMAL HIGH (ref 4.0–10.5)

## 2020-03-15 LAB — BASIC METABOLIC PANEL
BUN: 16 mg/dL (ref 6–23)
CO2: 26 mEq/L (ref 19–32)
Calcium: 8.8 mg/dL (ref 8.4–10.5)
Chloride: 107 mEq/L (ref 96–112)
Creatinine, Ser: 0.99 mg/dL (ref 0.40–1.50)
GFR: 75.31 mL/min (ref >60.00)
Glucose, Bld: 102 mg/dL — ABNORMAL HIGH (ref 70–99)
Potassium: 4.1 mEq/L (ref 3.5–5.1)
Sodium: 140 mEq/L (ref 135–145)

## 2020-03-15 LAB — HEPATIC FUNCTION PANEL
ALT: 14 U/L (ref 0–53)
AST: 12 U/L (ref 0–37)
Albumin: 4 g/dL (ref 3.5–5.2)
Alkaline Phosphatase: 90 U/L (ref 39–117)
Bilirubin, Direct: 0.1 mg/dL (ref 0.0–0.3)
Total Bilirubin: 0.4 mg/dL (ref 0.2–1.2)
Total Protein: 6.6 g/dL (ref 6.0–8.3)

## 2020-03-15 LAB — T4, FREE: Free T4: 0.9 ng/dL (ref 0.60–1.60)

## 2020-03-15 LAB — PSA: PSA: 2.5 ng/mL (ref 0.10–4.00)

## 2020-03-15 LAB — LIPID PANEL
Cholesterol: 139 mg/dL (ref 0–200)
HDL: 35.4 mg/dL — ABNORMAL LOW (ref >39.00)
LDL Cholesterol: 69 mg/dL (ref 0–99)
NonHDL: 104.03
Total CHOL/HDL Ratio: 4
Triglycerides: 176 mg/dL — ABNORMAL HIGH (ref 0.0–149.0)
VLDL: 35.2 mg/dL (ref 0.0–40.0)

## 2020-03-15 LAB — T3, FREE: T3, Free: 3 pg/mL (ref 2.3–4.2)

## 2020-03-15 LAB — HEMOGLOBIN A1C: Hgb A1c MFr Bld: 6.2 % (ref 4.6–6.5)

## 2020-03-15 LAB — TSH: TSH: 1.8 u[IU]/mL (ref 0.35–4.50)

## 2020-03-15 MED ORDER — FAMOTIDINE 20 MG PO TABS: 20.0000 mg | ORAL_TABLET | ORAL | 0 refills | Status: DC | PRN

## 2020-03-15 MED ORDER — SILDENAFIL CITRATE 50 MG PO TABS: 50.0000 mg | ORAL_TABLET | ORAL | 3 refills | Status: DC | PRN

## 2020-03-15 NOTE — Progress Notes (Signed)
Subjective:    Patient ID: Luke [name], male    DOB: [birth year], 75 y.o.   MRN: [id number]  HPI Here to follow up on issues. He feels well. He saw Dr. Percival Spanish on 02-19-20 for paroxysmal atrial fibrillation, and he was pleased with his progress. Since he had the bout of a fib with RVR in [place], he has not had any further bouts of this. He has Diltiazem to use as needed, but so far he has not had to take this at all. He now wears an Apple watch to keep track of his heart rates. He sees the [place] clinic regularly for some of his medications. He had a clear colonoscopy in March, and he will not need any further studies.    Review of Systems  Constitutional: Negative.   HENT: Negative.   Eyes: Negative.   Respiratory: Negative.   Cardiovascular: Negative.   Gastrointestinal: Negative.   Genitourinary: Negative.   Musculoskeletal: Negative.   Skin: Negative.   Neurological: Negative.   Psychiatric/Behavioral: Negative.        Objective:   Physical Exam Constitutional:      General: He is not in acute distress.    Appearance: He is well-developed and well-nourished. He is not diaphoretic.  HENT:     Head: Normocephalic and atraumatic.     Right Ear: External ear normal.     Left Ear: External ear normal.     Nose: Nose normal.     Mouth/Throat:     Mouth: Oropharynx is clear and moist.     Pharynx: No oropharyngeal exudate.  Eyes:     General: No scleral icterus.       Right eye: No discharge.        Left eye: No discharge.     Extraocular Movements: EOM normal.     Conjunctiva/sclera: Conjunctivae normal.     Pupils: Pupils are equal, round, and reactive to light.  Neck:     Thyroid: No thyromegaly.     Vascular: No JVD.     Trachea: No tracheal deviation.  Cardiovascular:     Rate and Rhythm: Normal rate and regular rhythm.     Pulses: Intact distal pulses.     Heart sounds: Normal heart sounds. No murmur heard. No friction rub. No gallop.   Pulmonary:      Effort: Pulmonary effort is normal. No respiratory distress.     Breath sounds: Normal breath sounds. No wheezing or rales.  Chest:     Chest wall: No tenderness.  Abdominal:     General: Bowel sounds are normal. There is no distension.     Palpations: Abdomen is soft. There is no mass.     Tenderness: There is no abdominal tenderness. There is no guarding or rebound.  Genitourinary:    Penis: Normal. No tenderness.      Testes: Normal.     Prostate: Normal.     Rectum: Normal. Guaiac result negative.  Musculoskeletal:        General: No tenderness or edema. Normal range of motion.     Cervical back: Neck supple.  Lymphadenopathy:     Cervical: No cervical adenopathy.  Skin:    General: Skin is warm and dry.     Coloration: Skin is not pale.     Findings: No erythema or rash.  Neurological:     Mental Status: He is alert and oriented to person, place, and time.     Cranial Nerves: No cranial  nerve deficit.     Motor: No abnormal muscle tone.     Coordination: Coordination normal.     Deep Tendon Reflexes: Reflexes are normal and symmetric. Reflexes normal.  Psychiatric:        Mood and Affect: Mood and affect normal.        Behavior: Behavior normal.        Thought Content: Thought content normal.        Judgment: Judgment normal.           Assessment & Plan:  His HTN and atrial fibrillation are stable. His GERD and BPH are stable. We will get fasting labs to check A1c, lipids, etc. Medications are refilled. His immunizations are UTD.  [name], MD

## 2020-03-22 ENCOUNTER — Ambulatory Visit: Payer: Medicare Other | Admitting: Family Medicine

## 2020-03-28 ENCOUNTER — Other Ambulatory Visit: Payer: Self-pay

## 2020-03-28 ENCOUNTER — Ambulatory Visit: Payer: Medicare Other | Admitting: Cardiology

## 2020-03-29 ENCOUNTER — Ambulatory Visit (INDEPENDENT_AMBULATORY_CARE_PROVIDER_SITE_OTHER): Payer: Medicare Other | Admitting: Family Medicine

## 2020-03-29 ENCOUNTER — Encounter: Payer: Self-pay | Admitting: Family Medicine

## 2020-03-29 VITALS — BP 108/72 | HR 56 | Temp 98.1°F | Ht 67.0 in | Wt 181.4 lb

## 2020-03-29 DIAGNOSIS — R739 Hyperglycemia, unspecified: Secondary | ICD-10-CM | POA: Diagnosis not present

## 2020-03-29 DIAGNOSIS — I10 Essential (primary) hypertension: Secondary | ICD-10-CM

## 2020-03-29 DIAGNOSIS — L989 Disorder of the skin and subcutaneous tissue, unspecified: Secondary | ICD-10-CM | POA: Diagnosis not present

## 2020-03-29 DIAGNOSIS — I6523 Occlusion and stenosis of bilateral carotid arteries: Secondary | ICD-10-CM | POA: Diagnosis not present

## 2020-03-29 DIAGNOSIS — E785 Hyperlipidemia, unspecified: Secondary | ICD-10-CM | POA: Diagnosis not present

## 2020-03-29 DIAGNOSIS — I480 Paroxysmal atrial fibrillation: Secondary | ICD-10-CM | POA: Diagnosis not present

## 2020-03-29 NOTE — Progress Notes (Signed)
° °  Subjective:    Patient ID: Luke [name], male    DOB: 05-04-1945, 75 y.o.   MRN: [id number]  HPI Here to discuss the results of labs he had drawn on 03-15-20 after he had an OV with [place]. His A1c was stable at 6.2. His lipids were stable with an LDL of 69 and slightly elevated TG at 176. Otherwise they looked good. He asks for a referral to Dermatology for a general skin exam.    Review of Systems  Constitutional: Negative.   Respiratory: Negative.   Cardiovascular: Negative.        Objective:   Physical Exam Constitutional:      Appearance: Normal appearance.  Cardiovascular:     Rate and Rhythm: Normal rate and regular rhythm.     Pulses: Normal pulses.     Heart sounds: Normal heart sounds.  Pulmonary:     Effort: Pulmonary effort is normal.     Breath sounds: Normal breath sounds.  Neurological:     Mental Status: He is alert.           Assessment & Plan:  He is doing well with lipids and glucose control. HTN is stable and he has not had any atrial fibrillation for several months. We will refer him to Dermatology as above.  [name], MD

## 2020-04-04 ENCOUNTER — Other Ambulatory Visit: Payer: Self-pay

## 2020-04-08 ENCOUNTER — Other Ambulatory Visit: Payer: Self-pay | Admitting: Family Medicine

## 2020-04-11 ENCOUNTER — Ambulatory Visit (INDEPENDENT_AMBULATORY_CARE_PROVIDER_SITE_OTHER): Payer: Medicare Other | Admitting: Pharmacist

## 2020-04-11 DIAGNOSIS — E782 Mixed hyperlipidemia: Secondary | ICD-10-CM | POA: Diagnosis not present

## 2020-04-11 DIAGNOSIS — I10 Essential (primary) hypertension: Secondary | ICD-10-CM

## 2020-04-11 NOTE — Progress Notes (Signed)
Chronic Care Management Pharmacy  Name: Luke [name]     MRN: [id number]        DOB: Jul 05, 1945  Initial Planning Appointment:  Completed 12/11/19  Initial Questions: 1. Have you seen any other providers since your last visit?n/a 2. Any changes in your medicines or health?No   Chief Complaint/ HPI  Luke [name],  75 y.o. , male presents for their follow up CCM visit with the clinical pharmacist In office.  PCP : Laurey Morale, MD  Their chronic conditions include: HTN, HLD, A fib, GERD, BPH, SOB, ED, muscle spasms, HSV prophylaxis, allergic rhinitis  Office Visits: -03/29/20 [name], MD: Patient presented for follow up to discuss lab results.  -03/15/20 [name], MD: Patient presented for annual visit.  No changes made.  -02/04/20 [name], MD: Patient presented for ED visit follow up. Prescribed diltiazem for HR > 100 bpm.  -12/23/19 Ofilia Neas, LPN: Patient presented for medicare annual wellness visit.  -12/07/19 [name], MD: Patient presented for ringing in ears evaluation. Pt reports it is ongoing for the past 6 months and only bothers him at night. [facility] provider recommended increase in Lipitor dose due to carotid disease and decided against due to concern for side effects.  Consult Visit: -02/19/20 Minus Breeding, MD (cardiology): Patient presented for Afib follow up.  -01/25/20 Patient presented to the ED with tachycardia.  -07/24/19 Minus Breeding, MD (cardiology): Patient presented for Afib follow up. Pt reports rare palpitations and denies chest pressure, neck, or arm discomfort. Follow up in one year.  -05/19/19 Mali Rund (pathology): Patient presented for evaluation of colon polyp. Unable to access notes.  Medications:  Current Outpatient Medications on File Prior to Visit  Medication Sig Dispense Refill  . albuterol (VENTOLIN HFA) 108 (90 Base) MCG/ACT inhaler 1 puff    . amLODipine (NORVASC) 10 MG tablet TAKE 1 TABLET DAILY 90  tablet 3  . apixaban (ELIQUIS) 5 MG TABS tablet Take 1 tablet (5 mg total) by mouth 2 (two) times daily for 30 days. 60 tablet 0  . atorvastatin (LIPITOR) 10 MG tablet TAKE 1 TABLET AT BEDTIME 90 tablet 1  . carvedilol (COREG) 6.25 MG tablet TAKE 1 TABLET TWICE DAILY  WITH MEALS 180 tablet 3  . diltiazem (CARDIZEM) 30 MG tablet Take one tablet as needed for heart rates greater than 100 30 tablet 0  . famotidine (PEPCID) 20 MG tablet Take 1 tablet (20 mg total) by mouth as needed for heartburn or indigestion. 1 tablet 0  . losartan (COZAAR) 100 MG tablet TAKE 1 TABLET DAILY 90 tablet 3  . sildenafil (VIAGRA) 50 MG tablet Take 1 tablet (50 mg total) by mouth as needed. 30 tablet 3   No current facility-administered medications on file prior to visit.   Patient had an episode in January and took 2 tablets and last 10-12 hours -   Goals Addressed            This Visit's Progress   . Pharmacy Care Plan       CARE PLAN ENTRY (see longitudinal plan of care for additional care plan information)  Current Barriers:  . Chronic Disease Management support, education, and care coordination needs related to Hypertension, Hyperlipidemia, Atrial Fibrillation, and GERD   Hypertension BP Readings from Last 3 Encounters:  12/07/19 120/70  07/24/19 138/66  03/19/19 120/62   . Pharmacist Clinical Goal(s): o Over the next 150 days, patient will work with PharmD and providers to maintain BP  goal <130/80 . Current regimen:  . Amlodipine 10 mg 1 tablet daily  . Losartan 100 mg 1 tablet daily  . Carvedilol 6.25 mg 1 tablet twice daily . Interventions: o We discussed taking carvedilol with food to minimize possible side effects and slow absorption o We discussed the importance of monitoring blood pressure at home regularly o We discussed maintaining a low salt diet and increasing exercise in order to help manage high blood pressure . Patient self care activities - Over the next 150 days, patient  will: o Check blood pressure weekly, document, and provide at future appointments o Ensure daily salt intake < 2300 mg/day o Take carvedilol with food or around a mealtime  Hyperlipidemia Lab Results  Component Value Date/Time   LDLCALC 71 03/12/2019 09:14 AM   LDLDIRECT 73.0 02/08/2015 09:06 AM   . Pharmacist Clinical Goal(s): o Over the next 150 days, patient will work with PharmD and providers to achieve LDL goal < 70 . Current regimen:  o Atorvastatin 10 mg 1 tablet at bedtime . Interventions: o We discussed increasing exercise in order to help lower cholesterol o We discussed incorporating 10-25 g of fiber in diet to help lower cholesterol through foods such as green leafy vegetables, fruit, and oatmeal . Patient self care activities - Over the next 150 days, patient will: o Continue current medications  A fib . Pharmacist Clinical Goal(s): o Over the next 150 days, patient will work with PharmD and providers to maintain heart rate < 110 beats per minute . Current regimen:  . Eliquis 5 mg 1 tablet twice daily . Carvedilol 6.25 mg 1 tablet twice daily . Interventions: o We discussed monitoring for bleeding such as blood in urine or stool, nosebleeds, or unexpected bruising . Patient self care activities - Over the next 150 days, patient will: o Check heart rate weekly along with blood pressure o Contact provider with any episodes of bleeding  GERD . Pharmacist Clinical Goal(s) o Over the next 150 days, patient will work with PharmD and providers to maintain symptoms of indigestions/heartburn . Current regimen:  o Omeprazole 20 mg 1 capsule every other day . Interventions: o We discussed trying Tums over the counter as needed for relief of symptoms o We discussed other ways to manage symptoms such as avoiding food 2 hours before bedtime, avoiding spicy/acidic/fatty foods, and elevating head of bed . Patient self care activities - Over the next 150 days, patient  will: o Continue current medications o Can use Tums as needed for symptoms in addition to omeprazole  Medication management . Pharmacist Clinical Goal(s): o Over the next 150 days, patient will work with PharmD and providers to maintain optimal medication adherence . [facility] . Interventions o Comprehensive medication review performed. o Continue current medication management strategy . Patient self care activities - Over the next 150 days, patient will: o Take medications as prescribed o Report any questions or concerns to PharmD and/or provider(s)  Please see past updates related to this goal by clicking on the "Past Updates" button in the selected goal        Hypertension   BP goal is:  <140/90  Office blood pressures are  BP Readings from Last 3 Encounters:  03/29/20 108/72  03/15/20 130/70  02/19/20 130/72   Patient checks BP at home infrequently Patient home BP readings are ranging: n/a  Patient has failed these meds in the past: none Patient is currently controlled on the following medications:  Amlodipine 10 mg 1 tablet daily - in AM  Losartan 100 mg 1 tablet daily - in AM  Carvedilol 6.25 mg 1 tablet twice daily  We discussed diet and exercise extensively  -DASH eating plan recommendations: . Emphasizes vegetables, fruits, and whole-grains . Includes fat-free or low-fat dairy products, fish, poultry, beans, nuts, and vegetable oils . Limits foods that are high in saturated fat. These foods include fatty meats, full-fat dairy products, and tropical oils such as coconut, palm kernel, and palm oils. . Limits sugar-sweetened beverages and sweets . Limiting sodium intake to < 1500 mg/day -Patient confirmed he is taking carvedilol at meal times   Plan  Continue current medications  BP assessment in 2 months.    AFIB   Patient is currently rate controlled. Office heart rates are  Pulse Readings from Last 3 Encounters:  03/29/20  (!) 56  03/15/20 (!) 59  02/19/20 (!) 53   HR: upper 55-60s HR in Afib 100-145  CHA2DS2-VASc Score =   4 The patient's score is based upon: age, CVA, HTN   Patient has failed these meds in past: metoprolol (unknown) Patient is currently controlled on the following medications:   Eliquis 5 mg 1 tablet twice daily  Carvedilol 6.25 mg 1 tablet twice daily  We discussed:  monitoring HR along with BP   Plan Continue current medications  Hyperlipidemia   LDL goal < 70  Last lipids Lab Results  Component Value Date   CHOL 139 03/15/2020   HDL 35.40 (L) 03/15/2020   LDLCALC 69 03/15/2020   LDLDIRECT 73.0 02/08/2015   TRIG 176.0 (H) 03/15/2020   CHOLHDL 4 03/15/2020   Hepatic Function Latest Ref Rng & Units 03/15/2020 03/12/2019 04/19/2018  Total Protein 6.0 - 8.3 g/dL 6.6 6.4 6.8  Albumin 3.5 - 5.2 g/dL 4.0 3.8 3.5  AST 0 - 37 U/L _0 ALT 0 - 53 U/L _1 Alk Phosphatase 39 - 117 U/L 90 91 82  Total Bilirubin 0.2 - 1.2 mg/dL 0.4 0.4 0.5  Bilirubin, Direct 0.0 - 0.3 mg/dL 0.1 0.1 -     The ASCVD Risk score (Hercules., et al., 2013) failed to calculate for the following reasons:   The patient has a prior MI or stroke diagnosis   Patient has failed these meds in past: simvastatin Patient is currently controlled on the following medications:   Atorvastatin 10 mg 1 tablet at bedtime  We discussed:  diet and exercise extensively  -Lowering cholesterol through diet by: Marland Kitchen Limiting foods with cholesterol such as liver and other organ meats, egg yolks, shrimp, and whole milk dairy products . Avoiding saturated fats and trans fats and incorporating healthier fats, such as lean meat, nuts, and unsaturated oils like canola and olive oils . Eating foods with soluble fiber such as whole-grain cereals such as oatmeal and oat bran, fruits such as apples, bananas, oranges, pears, and prunes, legumes such as kidney beans, lentils, chick peas, black-eyed peas, and lima beans,  and green leafy vegetables . Limiting alcohol intake   Plan  Continue current medications   Pre-Diabetes   A1c goal <6.5%  Recent Relevant Labs: Lab Results  Component Value Date/Time   HGBA1C 6.2 03/15/2020 11:22 AM   HGBA1C 6.1 03/12/2019 09:14 AM   GFR 75.31 03/15/2020 11:22 AM   GFR 68.48 03/12/2019 09:14 AM   MICROALBUR 1.3 10/27/2013 09:27 AM    Last diabetic Eye exam: No results found for: HMDIABEYEEXA  Last  diabetic Foot exam: No results found for: HMDIABFOOTEX   Checking BG: Never  Patient has failed these meds in past: none Patient is currently controlled on the following medications: . No medications  We discussed: diet and exercise extensively  -Following the healthy plate method which includes: . Fill half of your plate with nonstarchy vegetables, such as spinach, broccoli, carrots and tomatoes. Venida Jarvis a quarter of your plate with a protein, such as tuna, lean pork or chicken. Venida Jarvis the last quarter with a whole-grain item, such as brown rice, or a starchy vegetable, such as green peas or potatoes. . Include "good" fats such as nuts or avocados in small amounts.   Plan  Continue control with diet and exercise   GERD   Patient has failed these meds in past: none Patient is currently controlled on the following medications:   Famotidine 20 mg 1 tablet as needed  We discussed:  Non-pharmacologic management of symptoms such as elevating the head of your bed, avoiding eating 2-3 hours before bed, avoiding triggering foods such as acidic, spicy, or fatty foods, eating smaller meals, and wearing clothes that are loose around the waist  Plan Continue current medications  Asthma/SOB   Patient has failed these meds in past: none Patient is currently controlled on the following medications:   Albuterol HFA inhale 2 puffs every 6 hours as needed (not used in several years)  Using maintenance inhaler regularly? No - does not need one Frequency of rescue  inhaler use:  prn - has not used in several years  Plan  Continue current medications  ED   Patient has failed these meds in past: none Patient is currently controlled on the following medications:   Sildenafil 50 mg 1 tablet as needed  Plan  Continue current medications  Muscle spasms   Patient has failed these meds in past: Flexeril (completed short course) Patient is currently controlled on the following medications:   Metaxalone 800 mg 1 tablet twice daily as needed  We discussed:  Patient has not used lately but has some on hand just in case  Plan  Continue current medications  HSV prophylaxis   Patient has failed these meds in past: none Patient is currently controlled on the following medications:   Acyclovir 200 mg 1 capsule every other day  Plan Requested new rx to be sent in. Continue current medications  BPH   PSA  Date Value Ref Range Status  03/15/2020 2.50 0.10 - 4.00 ng/mL Final    Comment:    Test performed using Access Hybritech PSA Assay, a parmagnetic partical, chemiluminecent immunoassay.  03/12/2019 2.28 0.10 - 4.00 ng/mL Final    Comment:    Test performed using Access Hybritech PSA Assay, a parmagnetic partical, chemiluminecent immunoassay.  02/24/2018 3.13 0.10 - 4.00 ng/mL Final    Comment:    Test performed using Access Hybritech PSA Assay, a parmagnetic partical, chemiluminecent immunoassay.     Patient has failed these meds in past: none Patient is currently controlled on the following medications:  . No medications   Plan  Continue current management.  Allergic rhinitis   Patient has failed these meds in past: none Patient is currently controlled on the following medications:   No medications  We discussed:  Patient has used claritin and Flonase in the past and has found relief; discussed avoiding benadryl in older adults due to increased risk of falls  Plan  Continue current management.  Vaccines    Reviewed and  discussed patient's vaccination history.    Immunization History  Administered Date(s) Administered  . Fluad Quad(high Dose 65+) 11/11/2018  . Influenza Split 12/18/2010, 01/04/2012  . Influenza Whole 12/20/2009  . Influenza, High Dose Seasonal PF 12/17/2012, 12/13/2015, 11/26/2016, 12/06/2017, 12/18/2019  . Influenza,inj,Quad PF,6+ Mos 11/12/2013, 01/06/2015  . PFIZER(Purple Top)SARS-COV-2 Vaccination 03/25/2019, 04/15/2019, 12/02/2019  . Pneumococcal Conjugate-13 11/12/2013  . Pneumococcal Polysaccharide-23 06/13/2011  . Td 04/23/2003  . Tdap 11/27/2016  . Zoster 01/16/2013     Plan  Recommended patient receive Shingrix vaccine at pharmacy.  Medication Management   Patient's preferred pharmacy is:  [facility], [place] [address] Phone: [phone] Fax: [phone]  [facility] [address] [place], [place]. [place] [address] Phone: [phone] Fax: [phone]  [facility] [place] [place] [facility] [address] [place] [address] Phone: [phone] Fax: [phone]  Uses pill box? Yes - pill box (once weekly); timer to remember to take Pt endorses 100% compliance  We discussed: Current pharmacy is preferred with insurance plan and patient is satisfied with pharmacy services  Plan  Continue current medication management strategy   Follow up:  5 month phone visit  [name], PharmD [facility] Pharmacist [place] [facility] [place]

## 2020-04-13 ENCOUNTER — Telehealth: Payer: Self-pay | Admitting: Family Medicine

## 2020-04-13 MED ORDER — ACYCLOVIR 200 MG PO CAPS: 200.0000 mg | ORAL_CAPSULE | Freq: Every day | ORAL | 11 refills | Status: DC

## 2020-04-13 NOTE — Addendum Note (Signed)
Addended by: [name] A on: 04/13/2020 12:47 PM   Modules accepted: Orders

## 2020-04-13 NOTE — Telephone Encounter (Signed)
acyclovir (ZOVIRAX) 200 MG capsule   [place], [place] Phone:  [phone]  Fax:  [phone]

## 2020-04-13 NOTE — Telephone Encounter (Signed)
Done

## 2020-04-15 NOTE — Patient Instructions (Addendum)
Hi Lennart!  It was such a pleasure getting to speak with you again! As we discussed, I think it would be helpful to consistently check your blood pressure at home maybe a few times a month just to keep an eye on the numbers. I will probably have my assistant reach out to you in a couple of months just to see how that's going. Don't hesitate to give me a call if you need anything in the meantime!  Best, Maddie  [name], PharmD [facility] Clinical Pharmacist [place] [facility] [place]    Visit Information  Goals Addressed            This Visit's Progress   . Pharmacy Care Plan       CARE PLAN ENTRY (see longitudinal plan of care for additional care plan information)  Current Barriers:  . Chronic Disease Management support, education, and care coordination needs related to Hypertension, Hyperlipidemia, Atrial Fibrillation, and GERD   Hypertension BP Readings from Last 3 Encounters:  12/07/19 120/70  07/24/19 138/66  03/19/19 120/62   . Pharmacist Clinical Goal(s): o Over the next 150 days, patient will work with PharmD and providers to maintain BP goal <130/80 . Current regimen:  . Amlodipine 10 mg 1 tablet daily  . Losartan 100 mg 1 tablet daily  . Carvedilol 6.25 mg 1 tablet twice daily . Interventions: o We discussed taking carvedilol with food to minimize possible side effects and slow absorption o We discussed the importance of monitoring blood pressure at home regularly o We discussed maintaining a low salt diet and increasing exercise in order to help manage high blood pressure . Patient self care activities - Over the next 150 days, patient will: o Check blood pressure weekly, document, and provide at future appointments o Ensure daily salt intake < 2300 mg/day o Take carvedilol with food or around a mealtime  Hyperlipidemia Lab Results  Component Value Date/Time   LDLCALC 71 03/12/2019 09:14 AM   LDLDIRECT 73.0 02/08/2015 09:06 AM    . Pharmacist Clinical Goal(s): o Over the next 150 days, patient will work with PharmD and providers to achieve LDL goal < 70 . Current regimen:  o Atorvastatin 10 mg 1 tablet at bedtime . Interventions: o We discussed increasing exercise in order to help lower cholesterol o We discussed incorporating 10-25 g of fiber in diet to help lower cholesterol through foods such as green leafy vegetables, fruit, and oatmeal . Patient self care activities - Over the next 150 days, patient will: o Continue current medications  A fib . Pharmacist Clinical Goal(s): o Over the next 150 days, patient will work with PharmD and providers to maintain heart rate < 110 beats per minute . Current regimen:  . Eliquis 5 mg 1 tablet twice daily . Carvedilol 6.25 mg 1 tablet twice daily . Interventions: o We discussed monitoring for bleeding such as blood in urine or stool, nosebleeds, or unexpected bruising . Patient self care activities - Over the next 150 days, patient will: o Check heart rate weekly along with blood pressure o Contact provider with any episodes of bleeding  GERD . Pharmacist Clinical Goal(s) o Over the next 150 days, patient will work with PharmD and providers to maintain symptoms of indigestions/heartburn . Current regimen:  o Omeprazole 20 mg 1 capsule every other day . Interventions: o We discussed trying Tums over the counter as needed for relief of symptoms o We discussed other ways to manage symptoms such as avoiding food  2 hours before bedtime, avoiding spicy/acidic/fatty foods, and elevating head of bed . Patient self care activities - Over the next 150 days, patient will: o Continue current medications o Can use Tums as needed for symptoms in addition to omeprazole  Medication management . Pharmacist Clinical Goal(s): o Over the next 150 days, patient will work with PharmD and providers to maintain optimal medication adherence . [facility] . Interventions o Comprehensive medication review performed. o Continue current medication management strategy . Patient self care activities - Over the next 150 days, patient will: o Take medications as prescribed o Report any questions or concerns to PharmD and/or provider(s)  Please see past updates related to this goal by clicking on the "Past Updates" button in the selected goal         Patient verbalizes understanding of instructions provided today and agrees to view in [place].   Telephone follow up appointment with pharmacy team member scheduled for: 5 months  Viona Gilmore, [facility]  How to Take Your Blood Pressure Blood pressure measures how strongly your blood is pressing against the walls of your arteries. Arteries are blood vessels that carry blood from your heart throughout your body. You can take your blood pressure at home with a machine. You may need to check your blood pressure at home:  To check if you have high blood pressure (hypertension).  To check your blood pressure over time.  To make sure your blood pressure medicine is working. Supplies needed:  Blood pressure machine, or monitor.  Dining room chair to sit in.  Table or desk.  Small notebook.  Pencil or pen. How to prepare Avoid these things for 30 minutes before checking your blood pressure:  Having drinks with caffeine in them, such as coffee or tea.  Drinking alcohol.  Eating.  Smoking.  Exercising. Do these things five minutes before checking your blood pressure:  Go to the bathroom and pee (urinate).  Sit in a dining chair. Do not sit in a soft couch or an armchair.  Be quiet. Do not talk. How to take your blood pressure Follow the instructions that came with your machine. If you have a digital blood pressure monitor, these may be the instructions: 1. Sit up straight. 2. Place your feet on the floor. Do not cross your ankles or legs. 3. Rest your left arm at the level  of your heart. You may rest it on a table, desk, or chair. 4. Pull up your shirt sleeve. 5. Wrap the blood pressure cuff around the upper part of your left arm. The cuff should be 1 inch (2.5 cm) above your elbow. It is best to wrap the cuff around bare skin. 6. Fit the cuff snugly around your arm. You should be able to place only one finger between the cuff and your arm. 7. Place the cord so that it rests in the bend of your elbow. 8. Press the power button. 9. Sit quietly while the cuff fills with air and loses air. 10. Write down the numbers on the screen. 11. Wait 2-3 minutes and then repeat steps 1-10.   What do the numbers mean? Two numbers make up your blood pressure. The first number is called systolic pressure. The second is called diastolic pressure. An example of a blood pressure reading is "120 over 80" (or 120/80). If you are an adult and do not have a medical condition, use this guide to find out if your blood pressure  is normal: Normal  First number: below 120.  Second number: below 80. Elevated  First number: 120-129.  Second number: below 80. Hypertension stage 1  First number: 130-139.  Second number: 80-89. Hypertension stage 2  First number: 140 or above.  Second number: 67 or above. Your blood pressure is above normal even if only the top or bottom number is above normal. Follow these instructions at home:  Check your blood pressure as often as your doctor tells you to.  Check your blood pressure at the same time every day.  Take your monitor to your next doctor's appointment. Your doctor will: ? Make sure you are using it correctly. ? Make sure it is working right.  Make sure you understand what your blood pressure numbers should be.  Tell your doctor if your medicine is causing side effects.  Keep all follow-up visits as told by your doctor. This is important. General tips:  You will need a blood pressure machine, or monitor. Your doctor can  suggest a monitor. You can buy one at a drugstore or online. When choosing one: ? Choose one with an arm cuff. ? Choose one that wraps around your upper arm. Only one finger should fit between your arm and the cuff. ? Do not choose one that measures your blood pressure from your wrist or finger. Where to find more information American Heart Association: www.heart.org Contact a doctor if:  Your blood pressure keeps being high. Get help right away if:  Your first blood pressure number is higher than 180.  Your second blood pressure number is higher than 120. Summary  Check your blood pressure at the same time every day.  Avoid caffeine, alcohol, smoking, and exercise for 30 minutes before checking your blood pressure.  Make sure you understand what your blood pressure numbers should be. This information is not intended to replace advice given to you by your health care provider. Make sure you discuss any questions you have with your health care provider. Document Revised: 02/13/2019 Document Reviewed: 02/13/2019 Elsevier Patient Education  2021 [employer].

## 2020-04-25 DIAGNOSIS — L821 Other seborrheic keratosis: Secondary | ICD-10-CM | POA: Diagnosis not present

## 2020-04-25 DIAGNOSIS — L819 Disorder of pigmentation, unspecified: Secondary | ICD-10-CM | POA: Diagnosis not present

## 2020-04-25 DIAGNOSIS — D229 Melanocytic nevi, unspecified: Secondary | ICD-10-CM | POA: Diagnosis not present

## 2020-04-25 DIAGNOSIS — D1801 Hemangioma of skin and subcutaneous tissue: Secondary | ICD-10-CM | POA: Diagnosis not present

## 2020-05-04 ENCOUNTER — Other Ambulatory Visit: Payer: Self-pay

## 2020-05-04 ENCOUNTER — Encounter: Payer: Self-pay | Admitting: Family Medicine

## 2020-05-04 ENCOUNTER — Ambulatory Visit (INDEPENDENT_AMBULATORY_CARE_PROVIDER_SITE_OTHER): Payer: Medicare Other | Admitting: Family Medicine

## 2020-05-04 VITALS — BP 118/70 | HR 60 | Temp 98.7°F | Wt 182.4 lb

## 2020-05-04 DIAGNOSIS — I480 Paroxysmal atrial fibrillation: Secondary | ICD-10-CM

## 2020-05-04 DIAGNOSIS — I6523 Occlusion and stenosis of bilateral carotid arteries: Secondary | ICD-10-CM | POA: Diagnosis not present

## 2020-05-04 NOTE — Progress Notes (Signed)
   Subjective:    Patient ID: Luke [name], male    DOB: [birth year], 75 y.o.   MRN: [id number]  HPI Here for help with some paperwork. Several years ago he and his wife had purchased tickets to go on a cruise to [place], but then he had a stroke and developed atrial fibrillation. He has not felt comfortable with travelling since then, and infact both myself and his cardiologist have advised him not to travel. He had purchased travels insurance, and now he needs a medical statement attesting to his medical condition. He feels well today.    Review of Systems  Constitutional: Negative.   Respiratory: Negative.   Cardiovascular: Negative.   Neurological: Negative.        Objective:   Physical Exam Constitutional:      Appearance: Normal appearance.  Cardiovascular:     Rate and Rhythm: Normal rate and regular rhythm.     Pulses: Normal pulses.     Heart sounds: Normal heart sounds.  Pulmonary:     Effort: Pulmonary effort is normal.     Breath sounds: Normal breath sounds.  Neurological:     Mental Status: He is alert.           Assessment & Plan:  His PAF is stable, but it does preclude him from travelling at least for now. We filled out the insurance form to document this. Follow up as cheduled.  [name], MD

## 2020-06-14 ENCOUNTER — Telehealth: Payer: Self-pay | Admitting: Pharmacist

## 2020-06-14 NOTE — Chronic Care Management (AMB) (Signed)
Chronic Care Management Pharmacy Assistant   Name: Luke [name]  MRN: [id number] DOB: Feb 21, 1946  Reason for Encounter: Disease State   Conditions to be addressed/monitored: HTN  Recent office visits:  . 03.02.2022 Laurey Morale, MD Family Medicine  Recent consult visits:  None  Hospital visits:  None in previous 6 months  Medications: Outpatient Encounter Medications as of 06/14/2020  Medication Sig  . acyclovir (ZOVIRAX) 200 MG capsule Take 1 capsule (200 mg total) by mouth 5 (five) times daily.  Marland Kitchen albuterol (VENTOLIN HFA) 108 (90 Base) MCG/ACT inhaler 1 puff  . amLODipine (NORVASC) 10 MG tablet TAKE 1 TABLET DAILY  . apixaban (ELIQUIS) 5 MG TABS tablet Take 1 tablet (5 mg total) by mouth 2 (two) times daily for 30 days.  Marland Kitchen atorvastatin (LIPITOR) 10 MG tablet TAKE 1 TABLET AT BEDTIME  . carvedilol (COREG) 6.25 MG tablet TAKE 1 TABLET TWICE DAILY  WITH MEALS  . diltiazem (CARDIZEM) 30 MG tablet Take one tablet as needed for heart rates greater than 100  . famotidine (PEPCID) 20 MG tablet Take 1 tablet (20 mg total) by mouth as needed for heartburn or indigestion.  Marland Kitchen losartan (COZAAR) 100 MG tablet TAKE 1 TABLET DAILY  . sildenafil (VIAGRA) 50 MG tablet Take 1 tablet (50 mg total) by mouth as needed.   No facility-administered encounter medications on file as of 06/14/2020.   Reviewed chart prior to disease state call. Spoke with patient regarding BP  Recent Office Vitals: BP Readings from Last 3 Encounters:  05/04/20 118/70  03/29/20 108/72  03/15/20 130/70   Pulse Readings from Last 3 Encounters:  05/04/20 60  03/29/20 (!) 56  03/15/20 (!) 59    Wt Readings from Last 3 Encounters:  05/04/20 182 lb 6.4 oz (82.7 kg)  03/29/20 181 lb 6.4 oz (82.3 kg)  03/15/20 181 lb 6.4 oz (82.3 kg)     Kidney Function Lab Results  Component Value Date/Time   CREATININE 0.99 03/15/2020 11:22 AM   CREATININE 1.17 01/25/2020 04:55 PM   GFR 75.31 03/15/2020 11:22 AM    GFRNONAA >60 01/25/2020 04:55 PM   GFRAA >60 04/19/2018 03:24 PM    BMP Latest Ref Rng & Units 03/15/2020 01/25/2020 03/12/2019  Glucose 70 - 99 mg/dL 102(H) 122(H) 103(H)  BUN 6 - 23 mg/dL 16 16 21   Creatinine 0.40 - 1.50 mg/dL 0.99 1.17 1.06  Sodium 135 - 145 mEq/L 140 139 142  Potassium 3.5 - 5.1 mEq/L 4.1 3.9 4.2  Chloride 96 - 112 mEq/L 107 103 107  CO2 19 - 32 mEq/L 26 25 26   Calcium 8.4 - 10.5 mg/dL 8.8 8.8(L) 9.1   . Current antihypertensive regimen:   Amlodipine 10 mg 1 tablet daily- in AM  Losartan 100 mg 1 tablet daily- in AM  Carvedilol 6.25 mg 1 tablet twice daily . How often are you checking your Blood Pressure? infrequently . Current home BP readings: None . What recent interventions/DTPs have been made by any provider to improve Blood Pressure control since last CPP Visit: None . Any recent hospitalizations or ED visits since last visit with CPP? No . What diet changes have been made to improve Blood Pressure Control?  o Increase water intake . What exercise is being done to improve your Blood Pressure Control?  o No change in exercise  Adherence Review: Is the patient currently on ACE/ARB medication? Yes Does the patient have >5 day gap between last estimated fill dates? No  I  spoke with the patient and discussed medication adherence. He does have no issue currently with his current medication. The patient states that he has been doing well. He has been taking his blood pressure readings at home and frequently. He did not have any readings today. He states that he has increased his water intake but has not changed his activities. There have been no changes to his medication. He denies any Ed visit since his last CPP or PCP visit. The patient denies any side effects from his medication. He is not having any issues with his pharmacy.  Star Rating Drugs:  Dispensed Quantity Pharmacy  Losartan 02.04.2022 [address] [place], [place] [phone]

## 2020-08-04 ENCOUNTER — Telehealth: Payer: Self-pay | Admitting: Pharmacist

## 2020-08-04 NOTE — Telephone Encounter (Signed)
Patient called as he thought he had an appointment scheduled with me next week and he will be out of town. Told him our appt isn't until July 5th @11am  and he was ok with that.  He also inquired about taking melatonin with his other current medications. Informed him there are no drug interactions and recommended a trial of a lower dose 2 or 3 mg and timed release given his inability to stay asleep.

## 2020-08-24 ENCOUNTER — Encounter: Payer: Self-pay | Admitting: Cardiology

## 2020-08-24 ENCOUNTER — Ambulatory Visit (INDEPENDENT_AMBULATORY_CARE_PROVIDER_SITE_OTHER): Payer: Medicare Other | Admitting: Cardiology

## 2020-08-24 ENCOUNTER — Other Ambulatory Visit: Payer: Self-pay

## 2020-08-24 VITALS — BP 144/72 | HR 71 | Ht 67.0 in | Wt 178.8 lb

## 2020-08-24 DIAGNOSIS — I6523 Occlusion and stenosis of bilateral carotid arteries: Secondary | ICD-10-CM

## 2020-08-24 DIAGNOSIS — E785 Hyperlipidemia, unspecified: Secondary | ICD-10-CM

## 2020-08-24 DIAGNOSIS — I10 Essential (primary) hypertension: Secondary | ICD-10-CM | POA: Diagnosis not present

## 2020-08-24 DIAGNOSIS — I480 Paroxysmal atrial fibrillation: Secondary | ICD-10-CM

## 2020-08-24 NOTE — Progress Notes (Signed)
Cardiology Office Note    Date:  08/24/2020   ID:  Luke [name] Dec 29, 1945, MRN [id number]  PCP:  Luke Morale, MD  Cardiologist:   Luke Breeding, MD   Chief Complaint  Patient presents with   Palpitations       History of Present Illness: Luke [name] is a 75 y.o. male who presents for follow up of atrial fib.    He was found to have atrial fib in the context of a URI.  He had a negative ischemia work up and unremarkable echo  He did not want to continue with the [place].   He was in the hospital with a CVA.   This appeared to be embolic. He was subsequently put on Eliquis.  Since I last saw him he was in the emergency room in late November with atrial fibrillation.  He left without being seen.  He probably was in fibrillation for about 10 hours and I did see an EKG which confirmed this.  While he was waiting in the emergency room he went back into sinus rhythm.    He returns for follow up.  He did have another episode of about 10 hours of atrial fibrillation.  This was in January.  He took the Cardizem and waited it out.  He said the rate was around 110 or so we will.  He could feel it but he did not have any chest pain or presyncope or syncope.  He is otherwise done well.  He is not being as active as I would like but he is not having any other cardiovascular symptoms such as chest pressure, neck or arm discomfort.  Is not having any new shortness of breath, PND or orthopnea.  He denies any palpitations, presyncope or syncope.    Past Medical History:  Diagnosis Date   Asthma    Depression    ED (erectile dysfunction)    Hyperlipidemia    Hypertension    Paroxysmal atrial fibrillation ([place]) 09/2016   Initial diagnosis was in setting of acute URI/asthma attack   Solitary pulmonary nodule 04/16/2013   03/30/13  CXR Question left nipple shadow.  10 mm ovoid nodular density right upper lobe, cannot exclude  pulmonary mass/ nodule > f/u cxr 04/29/2013 > no nodule      Stroke [facility])    Ulcer     Past Surgical History:  Procedure Laterality Date   COLONOSCOPY  05/15/2019   per Dr. Wilford Corner, clear, no repeats needed    NM MYOVIEW LTD  10/2016   ailable, images/films reviewed: From Epic Chart or Care Everywhere)   TRANSTHORACIC ECHOCARDIOGRAM  93/2671   Normal systolic function. EF 5560%. No RWMA. Gr 1 DD. Mild AoV calcification - no stenosis. Atrial Septum - lipomatous hypertrophy. --Essentially normal.     Current Outpatient Medications  Medication Sig Dispense Refill   acyclovir (ZOVIRAX) 200 MG capsule Take 1 capsule (200 mg total) by mouth 5 (five) times daily. 60 capsule 11   albuterol (VENTOLIN HFA) 108 (90 Base) MCG/ACT inhaler 1 puff     amLODipine (NORVASC) 10 MG tablet TAKE 1 TABLET DAILY 90 tablet 3   atorvastatin (LIPITOR) 10 MG tablet TAKE 1 TABLET AT BEDTIME 90 tablet 1   carvedilol (COREG) 6.25 MG tablet TAKE 1 TABLET TWICE DAILY  WITH MEALS 180 tablet 3   diltiazem (CARDIZEM) 30 MG tablet Take one tablet as needed for heart rates greater than 100 30 tablet 0  famotidine (PEPCID) 20 MG tablet Take 1 tablet (20 mg total) by mouth as needed for heartburn or indigestion. 1 tablet 0   losartan (COZAAR) 100 MG tablet TAKE 1 TABLET DAILY 90 tablet 3   sildenafil (VIAGRA) 50 MG tablet Take 1 tablet (50 mg total) by mouth as needed. 30 tablet 3   apixaban (ELIQUIS) 5 MG TABS tablet Take 1 tablet (5 mg total) by mouth 2 (two) times daily for 30 days. 60 tablet 0   No current facility-administered medications for this visit.    Allergies:   Aspirin, Hydrochlorothiazide, Irbesartan, and Symbicort [budesonide-formoterol fumarate]    ROS:  Please see the history of present illness.   Otherwise, review of systems are positive for none.   All other systems are reviewed and negative.    PHYSICAL EXAM: VS:  BP (!) 144/72   Pulse 71   Ht 5\' 7"  (1.702 m)   Wt 178 lb 12.8 oz (81.1 kg)   SpO2 97%   BMI 28.00 kg/m  , BMI Body mass  index is 28 kg/m. GENERAL:  Well appearing NECK:  No jugular venous distention, waveform within normal limits, carotid upstroke brisk and symmetric, no bruits, no thyromegaly LUNGS:  Clear to auscultation bilaterally CHEST:  Unremarkable HEART:  PMI not displaced or sustained,S1 and S2 within normal limits, no S3, no S4, no clicks, no rubs, no murmurs ABD:  Flat, positive bowel sounds normal in frequency in pitch, no bruits, no rebound, no guarding, no midline pulsatile mass, no hepatomegaly, no splenomegaly EXT:  2 plus pulses throughout, mild edema, no cyanosis no clubbing   EKG:  EKG is  not ordered today.    Recent Labs: 03/15/2020: ALT 14; BUN 16; Creatinine, Ser 0.99; Hemoglobin 13.9; Platelets 310.0; Potassium 4.1; Sodium 140; TSH 1.80    Lipid Panel    Component Value Date/Time   CHOL 139 03/15/2020 1122   TRIG 176.0 (H) 03/15/2020 1122   HDL 35.40 (L) 03/15/2020 1122   CHOLHDL 4 03/15/2020 1122   VLDL 35.2 03/15/2020 1122   LDLCALC 69 03/15/2020 1122   LDLDIRECT 73.0 02/08/2015 0906      Wt Readings from Last 3 Encounters:  08/24/20 178 lb 12.8 oz (81.1 kg)  05/04/20 182 lb 6.4 oz (82.7 kg)  03/29/20 181 lb 6.4 oz (82.3 kg)      Other studies Reviewed: Additional studies/ records that were reviewed today include: None Review of the above records demonstrates:    ASSESSMENT AND PLAN:  ATRIAL FIB:  Luke [name] has a CHA2DS2 - VASc score of  4.    He will let me know if he has any increased symptoms at which point he might need an antiarrhythmic.  We talked about this at length.  He tolerates anticoagulation.  No change in therapy.   HTN: The blood pressure is elevated today but actually is at target at home.  No change in therapy.   CAROTID DOPPLER: 60 to 79% right carotid stenosis (60% on angiogram) and 40 to 59% left stenosis.  This is followed by the [place].  DYSLIPDEMIA:   LDL was 65 with an HDL of 35.  No change in therapy.   Current medicines  are reviewed at length with the patient today.  The patient does not have concerns regarding medicines.  The following changes have been made:    Labs/ tests ordered today include: None  No orders of the defined types were placed in this encounter.    Disposition:  FU with me 6 months.   Signed, Luke Breeding, MD  08/24/2020 9:01 PM    [facility]

## 2020-08-24 NOTE — Patient Instructions (Signed)
Medication Instructions:  Your physician recommends that you continue on your current medications as directed. Please refer to the Current Medication list given to you today.  *If you need a refill on your cardiac medications before your next appointment, please call your pharmacy*   Lab Work: None ordered.    Testing/Procedures: None ordered.   Follow-Up: At [facility], you and your health needs are our priority.  As part of our continuing mission to provide you with exceptional heart care, we have created designated Provider Care Teams.  These Care Teams include your primary Cardiologist (physician) and Advanced Practice Providers (APPs -  Physician Assistants and Nurse Practitioners) who all work together to provide you with the care you need, when you need it.  We recommend signing up for the patient portal called "MyChart".  Sign up information is provided on this After Visit Summary.  MyChart is used to connect with patients for Virtual Visits (Telemedicine).  Patients are able to view lab/test results, encounter notes, upcoming appointments, etc.  Non-urgent messages can be sent to your provider as well.   To learn more about what you can do with MyChart, go to [email or web address].    Your next appointment:   12 month(s)  The format for your next appointment:   In Person  Provider:   Minus Breeding, MD

## 2020-08-26 ENCOUNTER — Ambulatory Visit: Payer: Medicare Other | Admitting: Cardiology

## 2020-09-02 ENCOUNTER — Telehealth: Payer: Self-pay | Admitting: Pharmacist

## 2020-09-02 NOTE — Chronic Care Management (AMB) (Signed)
Date- Patient called to remind of appointment with Watt Climes on July 5th, 2022 at 11:00 am.  No answer, left message of appointment date, time and type of appointment ( telephone). Left message to have all medications, supplements, blood pressure and/or blood sugar logs available during appointment and to return call if need to reschedule.   Star Rating Drug: Medication Dispensed Quantity Pharmacy  Losartan 100 mg 05.04.2022 [facility]    Any gaps in medications fill history?  No   Maia Breslow, [place] Pharmacist Assistant [phone]

## 2020-09-06 ENCOUNTER — Other Ambulatory Visit: Payer: Self-pay

## 2020-09-06 ENCOUNTER — Ambulatory Visit (INDEPENDENT_AMBULATORY_CARE_PROVIDER_SITE_OTHER): Payer: Medicare Other | Admitting: Pharmacist

## 2020-09-06 DIAGNOSIS — I10 Essential (primary) hypertension: Secondary | ICD-10-CM

## 2020-09-06 DIAGNOSIS — E782 Mixed hyperlipidemia: Secondary | ICD-10-CM | POA: Diagnosis not present

## 2020-09-06 DIAGNOSIS — I480 Paroxysmal atrial fibrillation: Secondary | ICD-10-CM | POA: Diagnosis not present

## 2020-09-06 NOTE — Progress Notes (Signed)
Chronic Care Management Pharmacy Note  09/06/2020 Name:  Luke [name] MRN:  [id number] DOB:  1945/08/09  Summary: BP not ideally at goal < 130/80 per last office visit reading Patient reports not getting a full nights sleep  Recommendations/Changes made from today's visit: -Recommended routine monitoring of BP at home -Recommended switching amlodipine to PM to even out BP medications -Recommended trial of melatonin timed release (no more than 10 mg per night) to replace Tylenol PM -Recommended moving carvedilol PM dose to 1-2 hours earlier to help improve sleep.  Plan: -Follow up in 1 month for sleep assessment   Subjective: Luke VALERIANO is an 75 y.o. year old male who is a primary patient of Laurey Morale, MD.  The CCM team was consulted for assistance with disease management and care coordination needs.    Engaged with patient face to face for follow up visit in response to provider referral for pharmacy case management and/or care coordination services.   Consent to Services:  The patient was given information about Chronic Care Management services, agreed to services, and gave verbal consent prior to initiation of services.  Please see initial visit note for detailed documentation.   Patient Care Team: Laurey Morale, MD as PCP - General (Family Medicine) Minus Breeding, MD as PCP - Cardiology (Cardiology) Viona Gilmore, [facility] as Pharmacist (Pharmacist)  Recent office visits: 05/04/20 [name], MD: Patient presented for A fib documentation for travel.  03/29/20 [name], MD: Patient presented for follow up to discuss lab results.  03/15/20 [name], MD: Patient presented for annual visit.  No changes made.  Recent consult visits: 08/24/20 Minus Breeding, MD (cardiology): Patient presented for Afib follow up with palpitations. No medication changes.  Hospital visits: None in previous 6 months   Objective:  Lab Results  Component Value Date    CREATININE 0.99 03/15/2020   BUN 16 03/15/2020   GFR 75.31 03/15/2020   GFRNONAA >60 01/25/2020   GFRAA >60 04/19/2018   NA 140 03/15/2020   K 4.1 03/15/2020   CALCIUM 8.8 03/15/2020   CO2 26 03/15/2020   GLUCOSE 102 (H) 03/15/2020    Lab Results  Component Value Date/Time   HGBA1C 6.2 03/15/2020 11:22 AM   HGBA1C 6.1 03/12/2019 09:14 AM   GFR 75.31 03/15/2020 11:22 AM   GFR 68.48 03/12/2019 09:14 AM   MICROALBUR 1.3 10/27/2013 09:27 AM    Last diabetic Eye exam: No results found for: HMDIABEYEEXA  Last diabetic Foot exam: No results found for: HMDIABFOOTEX   Lab Results  Component Value Date   CHOL 139 03/15/2020   HDL 35.40 (L) 03/15/2020   LDLCALC 69 03/15/2020   LDLDIRECT 73.0 02/08/2015   TRIG 176.0 (H) 03/15/2020   CHOLHDL 4 03/15/2020    Hepatic Function Latest Ref Rng & Units 03/15/2020 03/12/2019 04/19/2018  Total Protein 6.0 - 8.3 g/dL 6.6 6.4 6.8  Albumin 3.5 - 5.2 g/dL 4.0 3.8 3.5  AST 0 - 37 U/L _0 ALT 0 - 53 U/L _1 Alk Phosphatase 39 - 117 U/L 90 91 82  Total Bilirubin 0.2 - 1.2 mg/dL 0.4 0.4 0.5  Bilirubin, Direct 0.0 - 0.3 mg/dL 0.1 0.1 -    Lab Results  Component Value Date/Time   TSH 1.80 03/15/2020 11:22 AM   TSH 1.79 03/12/2019 09:14 AM   FREET4 0.90 03/15/2020 11:22 AM    CBC Latest Ref Rng & Units 03/15/2020 01/25/2020 03/12/2019  WBC 4.0 -  10.5 K/uL 10.7(H) 20.7(H) 10.2  Hemoglobin 13.0 - 17.0 g/dL 13.9 14.4 13.7  Hematocrit 39.0 - 52.0 % 41.5 44.4 41.0  Platelets 150.0 - 400.0 K/uL 310.0 335 304.0    No results found for: VD25OH  Clinical ASCVD: Yes  The ASCVD Risk score Mikey Bussing DC Jr., et al., 2013) failed to calculate for the following reasons:   The patient has a prior MI or stroke diagnosis    Depression screen [facility] 2/9 12/23/2019 02/24/2018 02/21/2017  Decreased Interest 0 0 0  Down, Depressed, Hopeless 0 0 0  PHQ - 2 Score 0 0 0  Altered sleeping 0 - -  Tired, decreased energy 0 - -  Change in appetite 0 - -   Feeling bad or failure about yourself  0 - -  Trouble concentrating 0 - -  Moving slowly or fidgety/restless 0 - -  Suicidal thoughts 0 - -  PHQ-9 Score 0 - -  Difficult doing work/chores Not difficult at all - -    CHA2DS2/VAS Stroke Risk Points  Current as of 4 minutes ago     4 >= 2 Points: High Risk  1 - 1.99 Points: Medium Risk  0 Points: Low Risk    Last Change: N/A      Details    This score determines the patient's risk of having a stroke if the  patient has atrial fibrillation.       Points Metrics  0 Has Congestive Heart Failure:  No    Current as of 4 minutes ago  0 Has Vascular Disease:  No    Current as of 4 minutes ago  1 Has Hypertension:  Yes    Current as of 4 minutes ago  1 Age:  50    Current as of 4 minutes ago  0 Has Diabetes:  No    Current as of 4 minutes ago  2 Had Stroke:  Yes  Had TIA:  No  Had Thromboembolism:  No    Current as of 4 minutes ago  0 Male:  No    Current as of 4 minutes ago       Social History   Tobacco Use  Smoking Status Never  Smokeless Tobacco Never   BP Readings from Last 3 Encounters:  08/24/20 (!) 144/72  05/04/20 118/70  03/29/20 108/72   Pulse Readings from Last 3 Encounters:  08/24/20 71  05/04/20 60  03/29/20 (!) 56   Wt Readings from Last 3 Encounters:  08/24/20 178 lb 12.8 oz (81.1 kg)  05/04/20 182 lb 6.4 oz (82.7 kg)  03/29/20 181 lb 6.4 oz (82.3 kg)   BMI Readings from Last 3 Encounters:  08/24/20 28.00 kg/m  05/04/20 28.57 kg/m  03/29/20 28.41 kg/m    Assessment/Interventions: Review of patient past medical history, allergies, medications, health status, including review of consultants reports, laboratory and other test data, was performed as part of comprehensive evaluation and provision of chronic care management services.   SDOH:  (Social Determinants of Health) assessments and interventions performed: No  SDOH Screenings   Alcohol Screen: Low Risk    Last Alcohol Screening Score  (AUDIT): 3  Depression (PHQ2-9): Low Risk    PHQ-2 Score: 0  Financial Resource Strain: Low Risk    Difficulty of Paying Living Expenses: Not hard at all  Food Insecurity: No Food Insecurity   Worried About Charity fundraiser in the Last Year: Never true   [place] in the Last  Year: Never true  Housing: Low Risk    Last Housing Risk Score: 0  Physical Activity: Insufficiently Active   Days of Exercise per Week: 2 days   Minutes of Exercise per Session: 60 min  Social Connections: Moderately Integrated   Frequency of Communication with Friends and Family: More than three times a week   Frequency of Social Gatherings with Friends and Family: Once a week   Attends Religious Services: More than 4 times per year   Active Member of [employer] or Organizations: No   Attends Music therapist: Never   Marital Status: Married  Stress: No Stress Concern Present   Feeling of Stress : Not at all  Tobacco Use: Low Risk    Smoking Tobacco Use: Never   Smokeless Tobacco Use: Never  Transportation Needs: No Transportation Needs   Lack of Transportation (Medical): No   Lack of Transportation (Non-Medical): No    CCM Care Plan  Allergies  Allergen Reactions   Aspirin Other (See Comments)    REACTION: ULCER _0  DOSES   Hydrochlorothiazide Other (See Comments)    Leg cramps   Irbesartan Other (See Comments)    Headaches    Symbicort [Budesonide-Formoterol Fumarate] Other (See Comments)    legcramps    Medications Reviewed Today     Reviewed by Minus Breeding, MD (Physician) on 08/24/20 at 2100  Med List Status: <None>   Medication Order Taking? Sig Documenting Provider Last Dose Status Informant  acyclovir (ZOVIRAX) 200 MG capsule [id number] Yes Take 1 capsule (200 mg total) by mouth 5 (five) times daily. Laurey Morale, MD Taking Active   albuterol (VENTOLIN HFA) 108 [phone] Base) MCG/ACT inhaler [id number] Yes 1 puff [provider] Taking Active   amLODipine  (NORVASC) 10 MG tablet [id number] Yes TAKE 1 TABLET DAILY Laurey Morale, MD Taking Active   apixaban (ELIQUIS) 5 MG TABS tablet [id number]  Take 1 tablet (5 mg total) by mouth 2 (two) times daily for 30 days. Damita Lack, MD  Expired 12/23/19 2359   atorvastatin (LIPITOR) 10 MG tablet [id number] Yes TAKE 1 TABLET AT BEDTIME Laurey Morale, MD Taking Active   carvedilol (COREG) 6.25 MG tablet [id number] Yes TAKE 1 TABLET TWICE DAILY  WITH MEALS Laurey Morale, MD Taking Active   diltiazem (CARDIZEM) 30 MG tablet [id number] Yes Take one tablet as needed for heart rates greater than 100 Laurey Morale, MD Taking Active   famotidine (PEPCID) 20 MG tablet [id number] Yes Take 1 tablet (20 mg total) by mouth as needed for heartburn or indigestion. Laurey Morale, MD Taking Active   losartan (COZAAR) 100 MG tablet [id number] Yes TAKE 1 TABLET DAILY Laurey Morale, MD Taking Active   sildenafil (VIAGRA) 50 MG tablet [id number] Yes Take 1 tablet (50 mg total) by mouth as needed. Laurey Morale, MD Taking Active             Patient Active Problem List   Diagnosis Date Noted   Tinnitus, bilateral 12/07/2019   Numbness 07/29/2018   Educated about COVID-19 virus infection 07/29/2018   Cerebrovascular accident (CVA) due to embolism of cerebral artery ([place]) 05/16/2018   Dyslipidemia 05/16/2018   Nodule of upper lobe of right lung 04/29/2018   Bilateral carotid artery stenosis 04/29/2018   Multiple thyroid nodules 04/29/2018   Acute CVA (cerebrovascular accident) ([place]) 04/19/2018   BPH with urinary obstruction 02/21/2017   NSVT (nonsustained ventricular tachycardia) ([place]) 09/27/2016   PAF (paroxysmal atrial fibrillation) ([place])  09/25/2016   Genital herpes 02/18/2016   Hyperglycemia 02/18/2016   Left knee pain 02/01/2015   Restless leg syndrome 03/30/2013   Allergic rhinitis 10/31/2009   Headache 10/31/2009   DEGENERATIVE DISC DISEASE, CERVICAL SPINE, W/RADICULOPATHY 07/28/2008   Essential  hypertension 07/08/2007   Asthma 07/02/2007   Hyperlipidemia 05/02/2007   ERECTILE DYSFUNCTION, MILD 05/02/2007   PVC (premature ventricular contraction) 05/02/2007   GERD (gastroesophageal reflux disease) 05/02/2007    Immunization History  Administered Date(s) Administered   Fluad Quad(high Dose 65+) 11/11/2018   Influenza Split 12/18/2010, 01/04/2012   Influenza Whole 12/20/2009   Influenza, High Dose Seasonal PF 12/17/2012, 12/13/2015, 11/26/2016, 12/06/2017, 12/18/2019   Influenza,inj,Quad PF,6+ Mos 11/12/2013, 01/06/2015   PFIZER(Purple Top)SARS-COV-2 Vaccination 03/25/2019, 04/15/2019, 12/02/2019   Pneumococcal Conjugate-13 11/12/2013   Pneumococcal Polysaccharide-23 06/13/2011   Td 04/23/2003   Tdap 11/27/2016   Zoster, Live 01/16/2013   Patient reports his biggest concern right now is with not sleeping very well. He is currently using Tylenol PM but is not sure that is a good long term solution. Recommended avoiding Tylenol PM due to age and trial of timed release melatonin no more than 10 mg/night. Patient has more trouble with staying asleep vs falling asleep. Discussed benefits of having a sleep study to rule out sleep apnea as patient describes feeling the need to gasp for air during the night occasionally.   Conditions to be addressed/monitored:  Hypertension, Hyperlipidemia, Atrial Fibrillation, GERD, BPH, Allergic Rhinitis, and SOB, ED, muscle spasms, HSV prophylaxis  Care Plan : [place]  Updates made by Viona Gilmore, [place] since 09/06/2020 12:00 AM     Problem: Problem: Hypertension, Hyperlipidemia, Atrial Fibrillation, GERD, BPH, Allergic Rhinitis, and SOB, ED, muscle spasms, HSV prophylaxis      Long-Range Goal: Patient-Specific Goal   Start Date: 09/06/2020  Expected End Date: 09/06/2021  This Visit's Progress: On track  Priority: High  Note:   Current Barriers:  Unable to independently monitor therapeutic efficacy  Pharmacist Clinical  Goal(s):  Patient will achieve adherence to monitoring guidelines and medication adherence to achieve therapeutic efficacy through collaboration with PharmD and provider.   Interventions: 1:1 collaboration with Laurey Morale, MD regarding development and update of comprehensive plan of care as evidenced by provider attestation and co-signature Inter-disciplinary care team collaboration (see longitudinal plan of care) Comprehensive medication review performed; medication list updated in electronic medical record  Hypertension (BP goal <130/80) -Not ideally controlled -Current treatment: Amlodipine 10 mg 1 tablet daily - in AM Losartan 100 mg 1 tablet daily - in AM  Carvedilol 6.25 mg 1 tablet twice daily -Medications previously tried: none  -Current home readings: does not check routinely at home -Current dietary habits: did not discuss -Current exercise habits: did not discuss -Denies hypotensive/hypertensive symptoms -Educated on BP goals and benefits of medications for prevention of heart attack, stroke and kidney damage; Importance of home blood pressure monitoring; Proper BP monitoring technique; Symptoms of hypotension and importance of maintaining adequate hydration; -Counseled to monitor BP at home weekly, document, and provide log at future appointments -Counseled on diet and exercise extensively Recommended to continue current medication Recommended moving amlodipine to evening time to even out BP medication coverage  Hyperlipidemia: (LDL goal < 70) -Controlled -Current treatment: Atorvastatin 10 mg 1 tablet at bedtime -Medications previously tried: simvastatin  -Current dietary patterns: did not discuss -Current exercise habits: did not discuss -Educated on Cholesterol goals;  Benefits of statin for ASCVD risk reduction; Importance of limiting foods  high in cholesterol; -Counseled on diet and exercise extensively Recommended to continue current medication Consider  increasing atorvastatin for further TG lowering  Pre-diabetes (A1c goal <6.5%) -Controlled -Current medications: No medications -Medications previously tried: none  -Current home glucose readings fasting glucose: does not need to check post prandial glucose: does not need to check -Denies hypoglycemic/hyperglycemic symptoms -Current meal patterns:  breakfast: did not discuss  lunch: did not discuss   dinner: did not discuss  snacks: did not discuss  drinks: did not discuss  -Current exercise: not routinely -Educated on A1c and blood sugar goals; Counseled to check feet daily and get yearly eye exams -Counseled to check feet daily and get yearly eye exams -Counseled on diet and exercise extensively   Atrial Fibrillation (Goal: prevent stroke and major bleeding) -Controlled -CHADSVASC: 4 -Current treatment: Rate control: Carvedilol 6.25 mg 1 tablet twice daily; diltiazem 30 mg 1 tablet as needed for HR > 100 Anticoagulation: Eliquis 5 mg 1 tablet twice daily -Medications previously tried: metoprolol (unknown) -Home BP and HR readings: does not check routinely  -Counseled on avoidance of NSAIDs due to increased bleeding risk with anticoagulants; -Recommended to continue current medication Recommended moving carvedilol to 1-2 hours earlier to help with sleep  GERD (Goal: minimize symptoms) -Controlled -Current treatment  Famotidine 20 mg 1 tablet as needed -Medications previously tried: none  -Recommended to continue current medication  Shortness of breath (Goal: improve breathing) -Controlled -Current treatment  Albuterol HFA inhale 2 puffs every 6 hours as needed (not used in several years) -Medications previously tried: none  -Recommended to continue current medication  ED (Goal: minimize symptoms) -Controlled -Current treatment  Sildenafil 50 mg 1 tablet as needed -Medications previously tried: none  -Recommended to continue current medication  Muscle spasms  (Goal: minimize symptoms) -Controlled -Current treatment  Metaxalone 800 mg 1 tablet twice daily as needed -Medications previously tried: none  -Recommended to continue current medication  HSV prophylaxis (Goal: prevent HSV infections) -Controlled -Current treatment  Acyclovir 200 mg 1 capsule every other day -Medications previously tried: none  -Recommended to continue current medication   Health Maintenance -Vaccine gaps: shingrix, COVID booster -Current therapy:  None -Educated on Cost vs benefit of each product must be carefully weighed by individual consumer -Patient is satisfied with current therapy and denies issues -Recommended to continue as is  Patient Goals/Self-Care Activities Patient will:  - take medications as prescribed check blood pressure weekly, document, and provide at future appointments target a minimum of 150 minutes of moderate intensity exercise weekly  Follow Up Plan: The care management team will reach out to the patient again over the next 30 days.        Medication Assistance: None required.  Patient affirms current coverage meets needs.  Compliance/Adherence/Medication fill history: Care Gaps: Shingrix, COVID booster  Star-Rating Drugs: Atorvastatin 10 mg - last filled 07/06/20 for 90 ds at [facility] Losartan 100 mg - last filled 07/06/20 for 90 ds at [facility]  Patient's preferred pharmacy is:  [place], [place] [address] Phone: [phone] Fax: [phone]  [facility] [address] - [place], [place]. [place] [address] Phone: [phone] Fax: [phone]  [facility] [place], [place] [facility] [address] [place] [address] Phone: [phone] Fax: [phone]  Uses pill box? Yes - weekly and  uses  timer Pt endorses 100% compliance  We discussed: Current pharmacy is preferred with insurance plan and patient is satisfied with pharmacy services Patient decided to: Continue current medication management strategy  Care Plan and Follow Up Patient Decision:  Patient agrees to Care Plan and Follow-up.  Plan: The care management team will reach out to the patient again over the next 30 days.  [name], PharmD, [place] Pharmacist [place] [facility] [place]

## 2020-09-06 NOTE — Patient Instructions (Addendum)
Hi Luke [name],  It was great to see you in person! Just as a reminder, please check your blood pressure at least once a week at home to keep an eye on it. Also, try timed release melatonin (no more than 10 mg per night) instead of the Tylenol PM as this is a much safer option.  Also, don't forget to move the amlodipine to the evening and try taking carvedilol 1-2 hours earlier in the evening to see if this helps with your sleep.  Please reach out to me if you have any questions or need anything before our follow up!  Best, Maddie  [name], PharmD, [place] [facility] [place]   Visit Information   Goals Addressed   None    Patient Care Plan: CCM Pharmacy Care Plan     Problem Identified: Problem: Hypertension, Hyperlipidemia, Atrial Fibrillation, GERD, BPH, Allergic Rhinitis, and SOB, ED, muscle spasms, HSV prophylaxis      Long-Range Goal: Patient-Specific Goal   Start Date: 09/06/2020  Expected End Date: 09/06/2021  This Visit's Progress: On track  Priority: High  Note:   Current Barriers:  Unable to independently monitor therapeutic efficacy  Pharmacist Clinical Goal(s):  Patient will achieve adherence to monitoring guidelines and medication adherence to achieve therapeutic efficacy through collaboration with PharmD and provider.   Interventions: 1:1 collaboration with Laurey Morale, MD regarding development and update of comprehensive plan of care as evidenced by provider attestation and co-signature Inter-disciplinary care team collaboration (see longitudinal plan of care) Comprehensive medication review performed; medication list updated in electronic medical record  Hypertension (BP goal <130/80) -Not ideally controlled -Current treatment: Amlodipine 10 mg 1 tablet daily - in AM Losartan 100 mg 1 tablet daily - in AM  Carvedilol 6.25 mg 1 tablet twice daily -Medications previously tried: none  -Current home  readings: does not check routinely at home -Current dietary habits: did not discuss -Current exercise habits: did not discuss -Denies hypotensive/hypertensive symptoms -Educated on BP goals and benefits of medications for prevention of heart attack, stroke and kidney damage; Importance of home blood pressure monitoring; Proper BP monitoring technique; Symptoms of hypotension and importance of maintaining adequate hydration; -Counseled to monitor BP at home weekly, document, and provide log at future appointments -Counseled on diet and exercise extensively Recommended to continue current medication Recommended moving amlodipine to evening time to even out BP medication coverage  Hyperlipidemia: (LDL goal < 70) -Controlled -Current treatment: Atorvastatin 10 mg 1 tablet at bedtime -Medications previously tried: simvastatin  -Current dietary patterns: did not discuss -Current exercise habits: did not discuss -Educated on Cholesterol goals;  Benefits of statin for ASCVD risk reduction; Importance of limiting foods high in cholesterol; -Counseled on diet and exercise extensively Recommended to continue current medication Consider increasing atorvastatin for further TG lowering  Pre-diabetes (A1c goal <6.5%) -Controlled -Current medications: No medications -Medications previously tried: none  -Current home glucose readings fasting glucose: does not need to check post prandial glucose: does not need to check -Denies hypoglycemic/hyperglycemic symptoms -Current meal patterns:  breakfast: did not discuss  lunch: did not discuss   dinner: did not discuss  snacks: did not discuss  drinks: did not discuss  -Current exercise: not routinely -Educated on A1c and blood sugar goals; Counseled to check feet daily and get yearly eye exams -Counseled to check feet daily and get yearly eye exams -Counseled on diet and exercise extensively   Atrial Fibrillation (Goal: prevent stroke and  major bleeding) -Controlled -CHADSVASC:  4 -Current treatment: Rate control: Carvedilol 6.25 mg 1 tablet twice daily; diltiazem 30 mg 1 tablet as needed for HR > 100 Anticoagulation: Eliquis 5 mg 1 tablet twice daily -Medications previously tried: metoprolol (unknown) -Home BP and HR readings: does not check routinely  -Counseled on avoidance of NSAIDs due to increased bleeding risk with anticoagulants; -Recommended to continue current medication Recommended moving carvedilol to 1-2 hours earlier to help with sleep  GERD (Goal: minimize symptoms) -Controlled -Current treatment  Famotidine 20 mg 1 tablet as needed -Medications previously tried: none  -Recommended to continue current medication  Shortness of breath (Goal: improve breathing) -Controlled -Current treatment  Albuterol HFA inhale 2 puffs every 6 hours as needed (not used in several years) -Medications previously tried: none  -Recommended to continue current medication  ED (Goal: minimize symptoms) -Controlled -Current treatment  Sildenafil 50 mg 1 tablet as needed -Medications previously tried: none  -Recommended to continue current medication  Muscle spasms (Goal: minimize symptoms) -Controlled -Current treatment  Metaxalone 800 mg 1 tablet twice daily as needed -Medications previously tried: none  -Recommended to continue current medication  HSV prophylaxis (Goal: prevent HSV infections) -Controlled -Current treatment  Acyclovir 200 mg 1 capsule every other day -Medications previously tried: none  -Recommended to continue current medication   Health Maintenance -Vaccine gaps: shingrix, COVID booster -Current therapy:  None -Educated on Cost vs benefit of each product must be carefully weighed by individual consumer -Patient is satisfied with current therapy and denies issues -Recommended to continue as is  Patient Goals/Self-Care Activities Patient will:  - take medications as prescribed check  blood pressure weekly, document, and provide at future appointments target a minimum of 150 minutes of moderate intensity exercise weekly  Follow Up Plan: The care management team will reach out to the patient again over the next 30 days.        Patient verbalizes understanding of instructions provided today and agrees to view in [place].  The pharmacy team will reach out to the patient again over the next 30 days.   Viona Gilmore, [facility]

## 2020-09-26 ENCOUNTER — Other Ambulatory Visit: Payer: Self-pay

## 2020-09-26 ENCOUNTER — Ambulatory Visit (INDEPENDENT_AMBULATORY_CARE_PROVIDER_SITE_OTHER): Payer: Medicare Other | Admitting: Family Medicine

## 2020-09-26 ENCOUNTER — Encounter: Payer: Self-pay | Admitting: Family Medicine

## 2020-09-26 VITALS — BP 138/80 | HR 72 | Temp 98.2°F | Ht 67.0 in | Wt 180.0 lb

## 2020-09-26 DIAGNOSIS — R0602 Shortness of breath: Secondary | ICD-10-CM | POA: Diagnosis not present

## 2020-09-26 DIAGNOSIS — G4700 Insomnia, unspecified: Secondary | ICD-10-CM

## 2020-09-26 DIAGNOSIS — I6523 Occlusion and stenosis of bilateral carotid arteries: Secondary | ICD-10-CM | POA: Diagnosis not present

## 2020-09-26 DIAGNOSIS — R079 Chest pain, unspecified: Secondary | ICD-10-CM | POA: Diagnosis not present

## 2020-09-26 NOTE — Progress Notes (Signed)
   Subjective:    Patient ID: Luke [name], male    DOB: 14-Oct-1945, 75 y.o.   MRN: [id number]  HPI Here for several issues. First he takes a Tylenol PM every night before bed and it helps him sleep. He asks if this is safe to do long term. Also he sometimes notices some mild SOB when lying in bed at night. No coughing. He never feels chest pain at these times. He never feels SOB with exertion during the day. Also he sometimes feels a mild pressure in the left chest area during the day, but this is not associated with exertion and with SOB. He had a Myoview stress test, which was normal, in 2018. He says he has not had any atrial fibrillation for a long time now. He says he has heartburn once awhile, and he takes OTC Pecpdid for this as needed.    Review of Systems  Constitutional: Negative.   Respiratory:  Positive for shortness of breath. Negative for cough and wheezing.   Cardiovascular:  Positive for chest pain. Negative for palpitations and leg swelling.      Objective:   Physical Exam Constitutional:      Appearance: Normal appearance.  Cardiovascular:     Rate and Rhythm: Normal rate and regular rhythm.     Pulses: Normal pulses.     Heart sounds: Normal heart sounds.  Pulmonary:     Effort: Pulmonary effort is normal.     Breath sounds: Normal breath sounds.  Neurological:     Mental Status: He is alert.          Assessment & Plan:  As far as taking Tylenol PM for sleep, I told him this is perfectly safe to do. As for the night time SOB and the daytime chest pains, these are all likely to be  related to GERD. He will start taking Pepcid 20 mg BID on a regular basis for the next 2 weeks, and then he will follow up with [place]. We spent 35 minutes reviewing records and discussing these issues.   [name], MD

## 2020-10-03 ENCOUNTER — Other Ambulatory Visit: Payer: Self-pay | Admitting: Family Medicine

## 2020-10-05 ENCOUNTER — Telehealth: Payer: Self-pay | Admitting: Pharmacist

## 2020-10-05 NOTE — Chronic Care Management (AMB) (Signed)
Chronic Care Management Pharmacy Assistant   Name: Luke [name]  MRN: [id number] DOB: [birth year]    Reason for Encounter: Disease State Hypertension Assessment Call   Conditions to be addressed/monitored: HTN   Recent office visits:  09-26-2020 Laurey Morale, MD- Patient presented for shortness of breath. No medication changes.  Recent consult visits:  None  Hospital visits:  None in previous 6 months  Medications: Outpatient Encounter Medications as of 10/05/2020  Medication Sig   acyclovir (ZOVIRAX) 200 MG capsule Take 1 capsule (200 mg total) by mouth 5 (five) times daily.   albuterol (VENTOLIN HFA) 108 (90 Base) MCG/ACT inhaler 1 puff   amLODipine (NORVASC) 10 MG tablet TAKE 1 TABLET DAILY   apixaban (ELIQUIS) 5 MG TABS tablet Take 1 tablet (5 mg total) by mouth 2 (two) times daily for 30 days.   atorvastatin (LIPITOR) 10 MG tablet TAKE 1 TABLET AT BEDTIME   carvedilol (COREG) 6.25 MG tablet TAKE 1 TABLET TWICE DAILY  WITH MEALS   diltiazem (CARDIZEM) 30 MG tablet Take one tablet as needed for heart rates greater than 100   famotidine (PEPCID) 20 MG tablet Take 1 tablet (20 mg total) by mouth as needed for heartburn or indigestion.   losartan (COZAAR) 100 MG tablet TAKE 1 TABLET DAILY   sildenafil (VIAGRA) 50 MG tablet Take 1 tablet (50 mg total) by mouth as needed.   No facility-administered encounter medications on file as of 10/05/2020.  Reviewed chart prior to disease state call. Spoke with patient regarding BP  Recent Office Vitals: BP Readings from Last 3 Encounters:  09/26/20 138/80  08/24/20 (!) 144/72  05/04/20 118/70   Pulse Readings from Last 3 Encounters:  09/26/20 72  08/24/20 71  05/04/20 60    Wt Readings from Last 3 Encounters:  09/26/20 180 lb (81.6 kg)  08/24/20 178 lb 12.8 oz (81.1 kg)  05/04/20 182 lb 6.4 oz (82.7 kg)     Kidney Function Lab Results  Component Value Date/Time   CREATININE 0.99 03/15/2020 11:22 AM   CREATININE  1.17 01/25/2020 04:55 PM   GFR 75.31 03/15/2020 11:22 AM   GFRNONAA >60 01/25/2020 04:55 PM   GFRAA >60 04/19/2018 03:24 PM    BMP Latest Ref Rng & Units 03/15/2020 01/25/2020 03/12/2019  Glucose 70 - 99 mg/dL 102(H) 122(H) 103(H)  BUN 6 - 23 mg/dL '16 16 21  '$ Creatinine 0.40 - 1.50 mg/dL 0.99 1.17 1.06  Sodium 135 - 145 mEq/L 140 139 142  Potassium 3.5 - 5.1 mEq/L 4.1 3.9 4.2  Chloride 96 - 112 mEq/L 107 103 107  CO2 19 - 32 mEq/L '26 25 26  '$ Calcium 8.4 - 10.5 mg/dL 8.8 8.8(L) 9.1    Current antihypertensive regimen:  Amlodipine 10 mg 1 tablet daily - during nights Losartan 100 mg 1 tablet daily - in AM Carvedilol 6.25 mg 1 tablet twice daily How often are you checking your Blood Pressure?  Patient reports he is not checking his blood pressures as often as he should be, reminded to check about once a week.  Current home BP readings: Patient reports his pressures have been around 138/78 What recent interventions/DTPs have been made by any provider to improve Blood Pressure control since last CPP Visit: Patient reports he has an upcoming appointment with Dr Sarajane Jews and would like to discuss tapering down his Losartan or removing it all together. He is unsure if that is the reason he has not been sleeping but feels that he  is taking too many bp medications. He reports he is often tired and lightheaded. He hadn't taken it for a bit and reports his pressure was ranging around the same as when he was taking it. Patient reports he is now taking his Amlodipine at night.  Any recent hospitalizations or ED visits since last visit with CPP? No What diet changes have been made to improve Blood Pressure Control?  Patient reports he has never used much salt in his meals or with cooking. He eats most of his meals at home and occassionally eats out. He does drink plenty of water.  What exercise is being done to improve your Blood Pressure Control?  Patient reports he is always out and about and stays relatively  active.   Adherence Review: Is the patient currently on ACE/ARB medication? Yes Does the patient have >5 day gap between last estimated fill dates? No  Notes: Reached out to patient per [name] to see if recent changes she recommended as far as sleep goes are improved. Patient reports he has not noticed a difference at he will be following up with PCP  Care Gaps: AWV last 10/21 - MSG sent to Ramond Craver CMA to schedule. Zoster Vaccine - Overdue COVID Booster#4 Therapist, music) - Overdue Flu Vaccine - Overdue   Star Rating Drugs:  Atorvastatin '10mg'$  - Last filled 10-03-2020 90DS at [facility] Losartan 100 mg - Last filled 10-03-2020 90DS at [facility] Pharmacist Assistant [phone]

## 2020-10-10 ENCOUNTER — Other Ambulatory Visit: Payer: Self-pay

## 2020-10-11 ENCOUNTER — Ambulatory Visit: Payer: Medicare Other | Admitting: Family Medicine

## 2020-10-11 ENCOUNTER — Other Ambulatory Visit: Payer: Self-pay

## 2020-10-11 ENCOUNTER — Encounter (HOSPITAL_BASED_OUTPATIENT_CLINIC_OR_DEPARTMENT_OTHER): Payer: Self-pay | Admitting: *Deleted

## 2020-10-11 ENCOUNTER — Emergency Department (HOSPITAL_BASED_OUTPATIENT_CLINIC_OR_DEPARTMENT_OTHER)
Admission: EM | Admit: 2020-10-11 | Discharge: 2020-10-11 | Disposition: A | Discharge status: Home / Self Care | Payer: Medicare Other | Attending: Emergency Medicine | Admitting: Emergency Medicine

## 2020-10-11 ENCOUNTER — Emergency Department (HOSPITAL_BASED_OUTPATIENT_CLINIC_OR_DEPARTMENT_OTHER): Payer: Medicare Other | Admitting: Radiology

## 2020-10-11 DIAGNOSIS — I10 Essential (primary) hypertension: Secondary | ICD-10-CM | POA: Diagnosis not present

## 2020-10-11 DIAGNOSIS — Z7901 Long term (current) use of anticoagulants: Secondary | ICD-10-CM | POA: Diagnosis not present

## 2020-10-11 DIAGNOSIS — Z79899 Other long term (current) drug therapy: Secondary | ICD-10-CM | POA: Diagnosis not present

## 2020-10-11 DIAGNOSIS — R42 Dizziness and giddiness: Secondary | ICD-10-CM | POA: Diagnosis not present

## 2020-10-11 DIAGNOSIS — J45909 Unspecified asthma, uncomplicated: Secondary | ICD-10-CM | POA: Diagnosis not present

## 2020-10-11 DIAGNOSIS — I480 Paroxysmal atrial fibrillation: Secondary | ICD-10-CM | POA: Insufficient documentation

## 2020-10-11 LAB — CBC
HCT: 41.9 % (ref 39.0–52.0)
Hemoglobin: 14 g/dL (ref 13.0–17.0)
MCH: 28 pg (ref 26.0–34.0)
MCHC: 33.4 g/dL (ref 30.0–36.0)
MCV: 83.8 fL (ref 80.0–100.0)
Platelets: 339 K/uL (ref 150–400)
RBC: 5 MIL/uL (ref 4.22–5.81)
RDW: 13.3 % (ref 11.5–15.5)
WBC: 10.3 K/uL (ref 4.0–10.5)
nRBC: 0 % (ref 0.0–0.2)

## 2020-10-11 LAB — BASIC METABOLIC PANEL WITH GFR
Anion gap: 9 (ref 5–15)
BUN: 16 mg/dL (ref 8–23)
CO2: 25 mmol/L (ref 22–32)
Calcium: 8.6 mg/dL — ABNORMAL LOW (ref 8.9–10.3)
Chloride: 106 mmol/L (ref 98–111)
Creatinine, Ser: 1.01 mg/dL (ref 0.61–1.24)
GFR, Estimated: >60 mL/min
Glucose, Bld: 125 mg/dL — ABNORMAL HIGH (ref 70–99)
Potassium: 4.1 mmol/L (ref 3.5–5.1)
Sodium: 140 mmol/L (ref 135–145)

## 2020-10-11 LAB — TROPONIN I (HIGH SENSITIVITY): Troponin I (High Sensitivity): 6 ng/L (ref <18)

## 2020-10-11 MED ORDER — PROPOFOL 10 MG/ML IV BOLUS
0.5000 mg/kg | Freq: Once | INTRAVENOUS | Status: DC
  Filled 2020-10-11: qty 20

## 2020-10-11 NOTE — Discharge Instructions (Addendum)
You went back into rhythm spontaneously today.  Just take your normal medications tonight.  You do not need to take the Cardizem.

## 2020-10-11 NOTE — ED Provider Notes (Signed)
Luke [name] Provider Note   CSN: [id number] Arrival date & time: 10/11/20  1207     History Chief Complaint  Patient presents with   Dizziness    Luke [name] is a 75 y.o. male.  Patient is a 75 year old male with a history of CVA, bilateral carotid artery stenosis, paroxysmal atrial fibrillation on Luke [name], hypertension, hyperlipidemia who is presenting today with complaints of being in A. fib.  He noticed at 3:30 AM this morning that he had gone into A. fib because he notices the irregular beat of his heart.  He reports it makes it difficult for him to sleep, he does not feel quite right and today he even had an episode of dizziness that lasted 2 to 3 seconds where he felt like his heart rate was going really fast and then resolved.  He reports he still feels the unusual beat of his heart but denies any dizziness, shortness of breath or chest pain at this time.  He has had no chest pain throughout this event.  Normally when he goes into atrial fibrillation he can take Cardizem and it will resolve but he has now taken 2 doses and it has not changed.  Last time he was out was in January but he converted spontaneously.  He denies any recent illness, change in medications or poor oral intake.  The history is provided by the patient.  Dizziness     Past Medical History:  Diagnosis Date   Asthma    Depression    ED (erectile dysfunction)    Hyperlipidemia    Hypertension    Paroxysmal atrial fibrillation (Luke [name]) 09/2016   Initial diagnosis was in setting of acute URI/asthma attack   Solitary pulmonary nodule 04/16/2013   03/30/13  CXR Question left nipple shadow.  10 mm ovoid nodular density right upper lobe, cannot exclude  pulmonary mass/ nodule > f/u cxr 04/29/2013 > no nodule     Stroke Luke [name])    Ulcer     Patient Active Problem List   Diagnosis Date Noted   Tinnitus, bilateral 12/07/2019   Numbness 07/29/2018   Educated about COVID-19 virus  infection 07/29/2018   Cerebrovascular accident (CVA) due to embolism of cerebral artery (Luke [name]) 05/16/2018   Dyslipidemia 05/16/2018   Nodule of upper lobe of right lung 04/29/2018   Bilateral carotid artery stenosis 04/29/2018   Multiple thyroid nodules 04/29/2018   Acute CVA (cerebrovascular accident) (Luke [name]) 04/19/2018   BPH with urinary obstruction 02/21/2017   NSVT (nonsustained ventricular tachycardia) (Luke [name]) 09/27/2016   PAF (paroxysmal atrial fibrillation) (Luke [name]) 09/25/2016   Genital herpes 02/18/2016   Hyperglycemia 02/18/2016   Left knee pain 02/01/2015   Restless leg syndrome 03/30/2013   Allergic rhinitis 10/31/2009   Headache 10/31/2009   DEGENERATIVE DISC DISEASE, CERVICAL SPINE, W/RADICULOPATHY 07/28/2008   Essential hypertension 07/08/2007   Asthma 07/02/2007   Hyperlipidemia 05/02/2007   ERECTILE DYSFUNCTION, MILD 05/02/2007   PVC (premature ventricular contraction) 05/02/2007   GERD (gastroesophageal reflux disease) 05/02/2007    Past Surgical History:  Procedure Laterality Date   COLONOSCOPY  05/15/2019   per Dr. Wilford [name], clear, no repeats needed    NM MYOVIEW LTD  10/2016   ailable, images/films reviewed: From Epic Chart or Care Everywhere)   TRANSTHORACIC ECHOCARDIOGRAM  99991111   Normal systolic function. EF 5560%. No RWMA. Gr 1 DD. Mild AoV calcification - no stenosis. Atrial Septum - lipomatous hypertrophy. --Essentially normal.       Family History  Problem  Relation Age of Onset   Stroke Mother    Hypertension Mother    Peripheral vascular disease Mother    Hyperlipidemia Father    Coronary artery disease Father    Peripheral vascular disease Father    Hypertension Brother    Coronary artery disease Brother    Cancer Brother        throat, smoked   Emphysema Brother        smoked   Lung cancer Brother        smoked    Social History   Tobacco Use   Smoking status: Never   Smokeless tobacco: Never  Vaping Use   Vaping Use: Never  used  Substance Use Topics   Alcohol use: Yes    Alcohol/week: 2.0 standard drinks    Types: 2 Standard drinks or equivalent per week    Comment: socially occasionally    Drug use: No    Home Medications Prior to Admission medications   Medication Sig Start Date End Date Taking? Authorizing Provider  acyclovir (ZOVIRAX) 200 MG capsule Take 1 capsule (200 mg total) by mouth 5 (five) times daily. 04/13/20  Yes Luke Morale, MD  amLODipine (NORVASC) 10 MG tablet TAKE 1 TABLET DAILY 02/29/20  Yes Luke Morale, MD  apixaban (Luke [name]) 5 MG TABS tablet Take 1 tablet (5 mg total) by mouth 2 (two) times daily for 30 days. 04/21/18 10/11/20 Yes Amin, Jeanella Flattery, MD  atorvastatin (LIPITOR) 10 MG tablet TAKE 1 TABLET AT BEDTIME 10/03/20  Yes Luke Morale, MD  carvedilol (COREG) 6.25 MG tablet TAKE 1 TABLET TWICE DAILY  WITH MEALS 02/29/20  Yes Luke Morale, MD  diltiazem (CARDIZEM) 30 MG tablet Take one tablet as needed for heart rates greater than 100 02/04/20  Yes Luke Morale, MD  famotidine (PEPCID) 20 MG tablet Take 1 tablet (20 mg total) by mouth as needed for heartburn or indigestion. 03/15/20  Yes Luke Morale, MD  losartan (COZAAR) 100 MG tablet TAKE 1 TABLET DAILY 01/11/20  Yes Luke Morale, MD  albuterol (VENTOLIN HFA) 108 (90 Base) MCG/ACT inhaler 1 puff    [provider]  sildenafil (VIAGRA) 50 MG tablet Take 1 tablet (50 mg total) by mouth as needed. 03/15/20   Luke Morale, MD    Allergies    Aspirin, Hydrochlorothiazide, Irbesartan, and Symbicort [budesonide-formoterol fumarate]  Review of Systems   Review of Systems  Neurological:  Positive for dizziness.  All other systems reviewed and are negative.  Physical Exam Updated Vital Signs BP 125/75 (BP Location: Right Arm)   Pulse 72   Temp 98.1 F (36.7 C)   Resp 20   Ht '5\' 7"'$  (1.702 m)   Wt 80.7 kg   SpO2 99%   BMI 27.88 kg/m   Physical Exam Vitals and nursing note reviewed.  Constitutional:       General: He is not in acute distress.    Appearance: He is well-developed.  HENT:     Head: Normocephalic and atraumatic.  Eyes:     Conjunctiva/sclera: Conjunctivae normal.     Pupils: Pupils are equal, round, and reactive to light.  Cardiovascular:     Rate and Rhythm: Normal rate. Rhythm irregularly irregular.     Heart sounds: No murmur heard. Pulmonary:     Effort: Pulmonary effort is normal. No respiratory distress.     Breath sounds: Normal breath sounds. No wheezing or rales.  Abdominal:     General:  There is no distension.     Palpations: Abdomen is soft.     Tenderness: There is no abdominal tenderness. There is no guarding or rebound.  Musculoskeletal:        General: No tenderness. Normal range of motion.     Cervical back: Normal range of motion and neck supple.  Skin:    General: Skin is warm and dry.     Findings: No erythema or rash.  Neurological:     Mental Status: He is alert and oriented to person, place, and time. Mental status is at baseline.  Psychiatric:        Mood and Affect: Mood normal.        Behavior: Behavior normal.    ED Results / Procedures / Treatments   Labs (all labs ordered are listed, but only abnormal results are displayed) Labs Reviewed  BASIC METABOLIC PANEL - Abnormal; Notable for the following components:      Result Value   Glucose, Bld 125 (*)    Calcium 8.6 (*)    All other components within normal limits  CBC  TROPONIN I (HIGH SENSITIVITY)  TROPONIN I (HIGH SENSITIVITY)    EKG EKG Interpretation  Date/Time:  Tuesday October 11 2020 12:16:36 EDT Ventricular Rate:  84 PR Interval:    QRS Duration: 136 QT Interval:  390 QTC Calculation: 460 R Axis:   -40 Text Interpretation: Atrial fibrillation Left axis deviation Right bundle branch block Abnormal ECG No acute ischemia Confirmed by Lorre Munroe ([id number]) on 10/11/2020 12:22:35 PM  Radiology DG Chest 2 View  Result Date: 10/11/2020 CLINICAL DATA:  AFib, dizziness EXAM:  CHEST - 2 VIEW COMPARISON:  Chest radiograph 01/25/2020 FINDINGS: The cardiomediastinal silhouette is within normal limits. The lungs are clear, with no focal consolidation or pulmonary edema. There is no pleural effusion or pneumothorax. There is scoliotic curvature of the thoracic spine. There is no acute osseous abnormality. IMPRESSION: No radiographic evidence of acute cardiopulmonary process. Electronically Signed   By: Valetta Mole MD   On: 10/11/2020 13:06    Procedures Procedures   Medications Ordered in ED Medications  propofol (DIPRIVAN) 10 mg/mL bolus/IV push 40.4 mg (has no administration in time range)    ED Course  I have reviewed the triage vital signs and the nursing notes.  Pertinent labs & imaging results that were available during my care of the patient were reviewed by me and considered in my medical decision making (see chart for details).    MDM Rules/Calculators/A&P                           Patient is a 75 year old male presenting today in atrial fibrillation.  Patient has been out of rhythm approximately 12 hours and is feeling symptoms of palpitation, dizziness and just not himself.  He denies any chest pain and is well-appearing at this time.  The rate is controlled and he is on Luke [name] which he takes twice daily and has not missed any doses in the last month.  Patient is a candidate for cardioversion today.  Discussed this with the patient versus following up with his cardiologist to have most likely similar procedure done.  Patient is wife agreed to undergo cardioversion here.  All of his labs are normal including a CBC, BMP troponin and chest x-ray.  4:15 PM Patient spontaneously converted.  He is feeling well.  He ambulated without any symptoms.  Heart remains in rhythm.  Patient  discharged home to follow-up with Dr. Percival Spanish as needed  MDM   Amount and/or Complexity of Data Reviewed Clinical lab tests: ordered and reviewed Tests in the radiology section of  CPT: ordered and reviewed Tests in the medicine section of CPT: reviewed and ordered Independent visualization of images, tracings, or specimens: yes     Final Clinical Impression(s) / ED Diagnoses Final diagnoses:  Paroxysmal atrial fibrillation [facility])    Rx / DC Orders ED Discharge Orders     None        Blanchie Dessert, MD 10/11/20 1616

## 2020-10-11 NOTE — ED Triage Notes (Addendum)
Complaint of dizziness, lightheadedness 20-30 mins ago lasting 2-3 seconds.  Has history of a-fib and is taking Eliquis.

## 2020-10-12 ENCOUNTER — Telehealth (HOSPITAL_COMMUNITY): Payer: Self-pay

## 2020-10-12 NOTE — Telephone Encounter (Signed)
Reached out to patient to schedule ED f/u. He stated  that he has an appointment with his PCP on 10/24/2020. I informed him that if he feels like he's back in Afib to give [place] a call and we can schedule an appointment.

## 2020-10-24 ENCOUNTER — Other Ambulatory Visit: Payer: Self-pay

## 2020-10-24 ENCOUNTER — Encounter: Payer: Self-pay | Admitting: Family Medicine

## 2020-10-24 ENCOUNTER — Ambulatory Visit (INDEPENDENT_AMBULATORY_CARE_PROVIDER_SITE_OTHER): Payer: Medicare Other | Admitting: Family Medicine

## 2020-10-24 VITALS — BP 136/74 | HR 68 | Temp 98.8°F | Wt 182.4 lb

## 2020-10-24 DIAGNOSIS — I4891 Unspecified atrial fibrillation: Secondary | ICD-10-CM | POA: Diagnosis not present

## 2020-10-24 DIAGNOSIS — R5382 Chronic fatigue, unspecified: Secondary | ICD-10-CM | POA: Diagnosis not present

## 2020-10-24 DIAGNOSIS — A6000 Herpesviral infection of urogenital system, unspecified: Secondary | ICD-10-CM | POA: Diagnosis not present

## 2020-10-24 DIAGNOSIS — I10 Essential (primary) hypertension: Secondary | ICD-10-CM | POA: Diagnosis not present

## 2020-10-24 DIAGNOSIS — I6523 Occlusion and stenosis of bilateral carotid arteries: Secondary | ICD-10-CM | POA: Diagnosis not present

## 2020-10-24 MED ORDER — AMLODIPINE BESYLATE 10 MG PO TABS: 5.0000 mg | ORAL_TABLET | Freq: Every day | ORAL | 3 refills | Status: DC

## 2020-10-24 NOTE — Progress Notes (Signed)
   Subjective:    Patient ID: Luke [name], male    DOB: 1946/01/09, 75 y.o.   MRN: [id number]  HPI Here to follow up an ER visit on 11-11-20 for a bout of atrial fibrillation. He felt fine but was aware that his heart was beating rapidly that day. This did not respond to an extra dose of Diltiazem like it normally does. At the ER all his labs were normal and an EKG confirmed fibrillation. They were almost ready to perform a cardioversion when he spontaneously converted to sinus rhythm. He has not had any bouts of this ever since (he wears an Apple watch to monitor for this). However he does complain of generalized fatigue  and he cannot figure out why. He thinks this may be a side effect of his medication and he asks if anything can be cut back or eliminated. His BP has been stable. He has not had a Herpes outbreak for at least 30 years.    Review of Systems  Constitutional:  Positive for fatigue.  Respiratory: Negative.    Cardiovascular: Negative.   Gastrointestinal: Negative.   Neurological: Negative.       Objective:   Physical Exam Constitutional:      Appearance: Normal appearance. He is not ill-appearing.  Cardiovascular:     Rate and Rhythm: Normal rate and regular rhythm.     Pulses: Normal pulses.     Heart sounds: Normal heart sounds.  Pulmonary:     Effort: Pulmonary effort is normal.     Breath sounds: Normal breath sounds.  Neurological:     General: No focal deficit present.     Mental Status: He is alert and oriented to person, place, and time.          Assessment & Plan:  He had a recent bout of atrial fibrillation that spontaneously converted. We will watch this closely. His HTN is well controlled. We agreed to stop the Acyclovir completely. We will decrease the Amlodipine to 5 mg daily (or 1/2 a tablet). He will report back in 3-4 weeks. We spent 35 minutes reviewing records and discussing these issues.  [name], MD

## 2020-10-30 IMAGING — CT CT ANGIO NECK
2 of 10 series · 8 of 46 positions shown, 13 images · IV contrast (iopamidol)
Comparison: Prior CT and MRI from earlier same day.

CLINICAL DATA: Follow-up examination for acute stroke.

EXAM:
CT ANGIOGRAPHY HEAD AND NECK
TECHNIQUE: Multidetector CT imaging of the head and neck was performed using
the standard protocol during bolus administration of intravenous
contrast. Multiplanar CT image reconstructions and MIPs were
obtained to evaluate the vascular anatomy. Carotid stenosis
measurements (when applicable) are obtained utilizing NASCET
criteria, using the distal internal carotid diameter as the
denominator.
CONTRAST:  <See Chart> [id number] IOPAMIDOL ([id number]-[id number]) INJECTION
76%

[Series 5: carotid/brain 2.0 i30f 3 · axial · 0.43mm/px · z∈[-346,-70]mm · 6 of 194 slices shown, 11 images]
[im 28/194  soft-tissue]
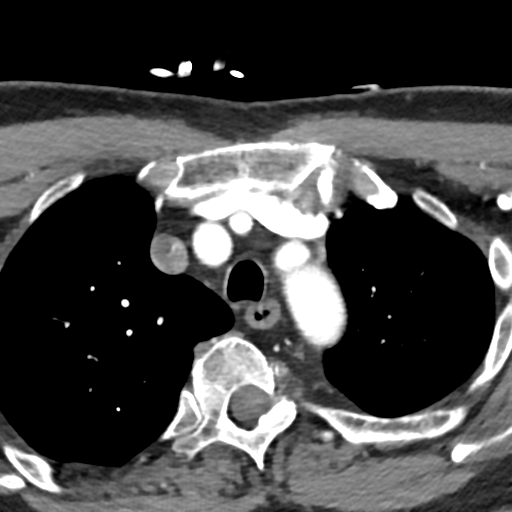
[im 28/194  bone]
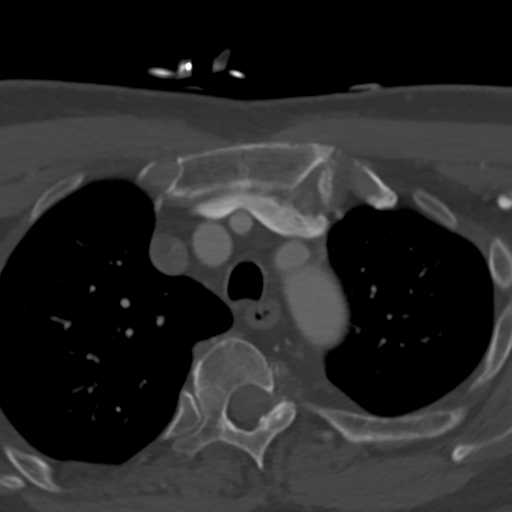
[im 56/194  soft-tissue]
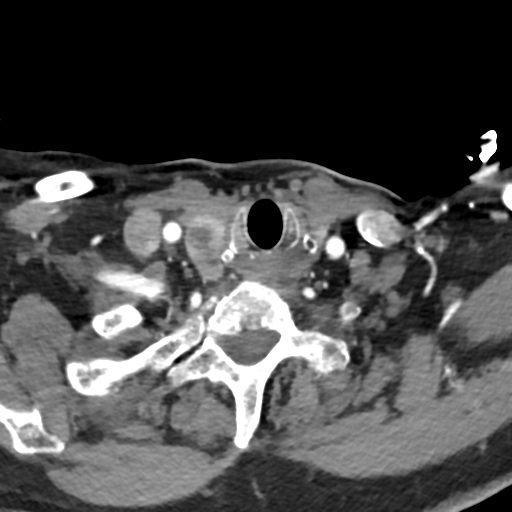
[im 83/194  soft-tissue]
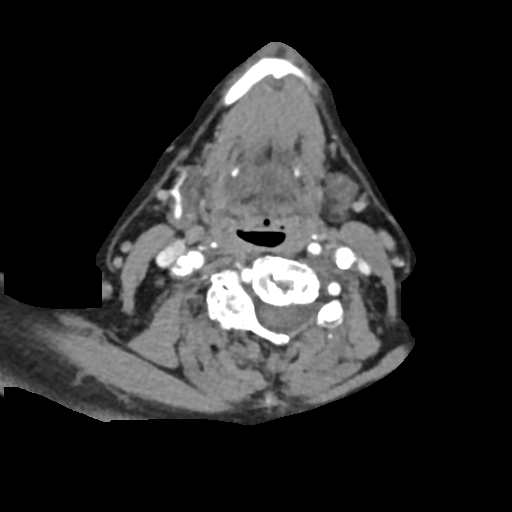
[im 83/194  lung]
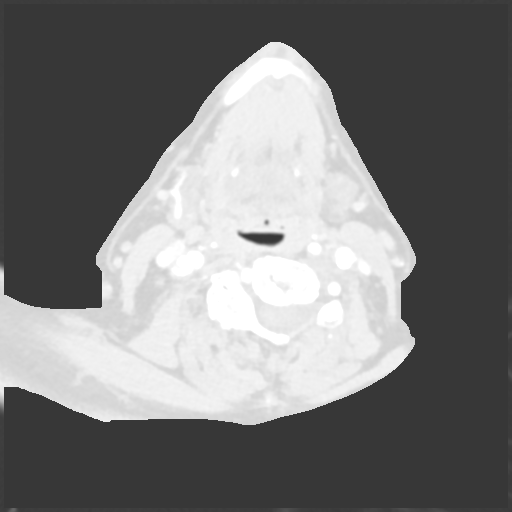
[im 111/194  soft-tissue]
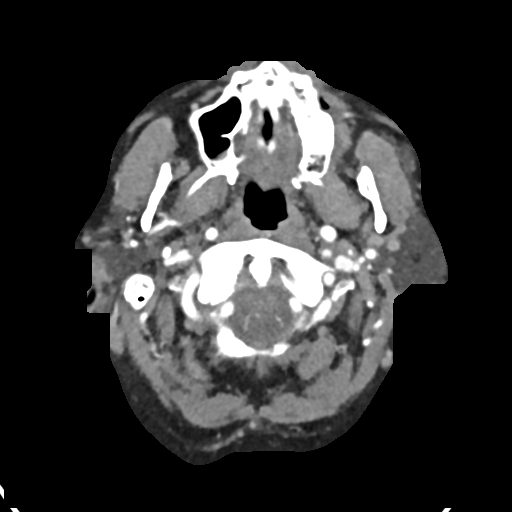
[im 111/194  lung]
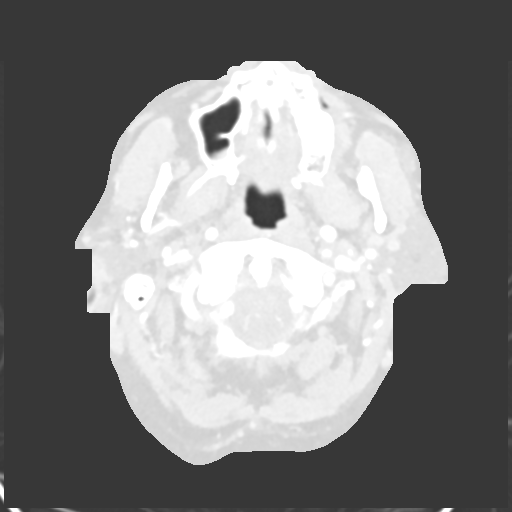
[im 138/194  soft-tissue]
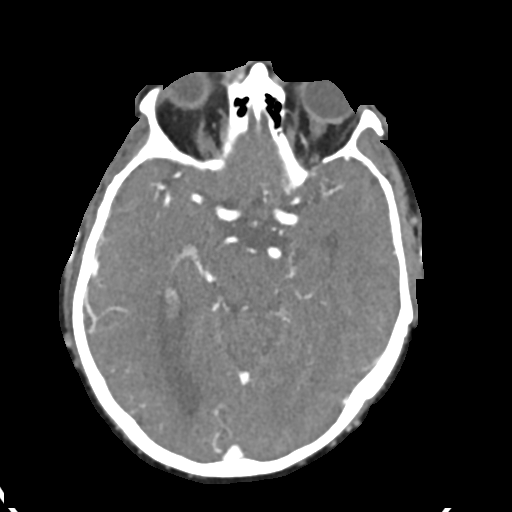
[im 138/194  lung]
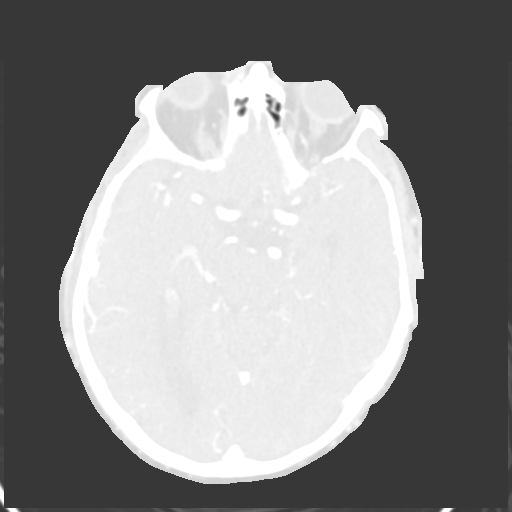
[im 166/194  soft-tissue]
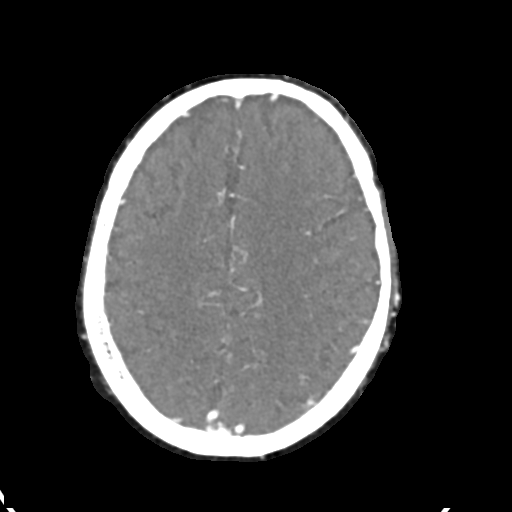
[im 166/194  lung]
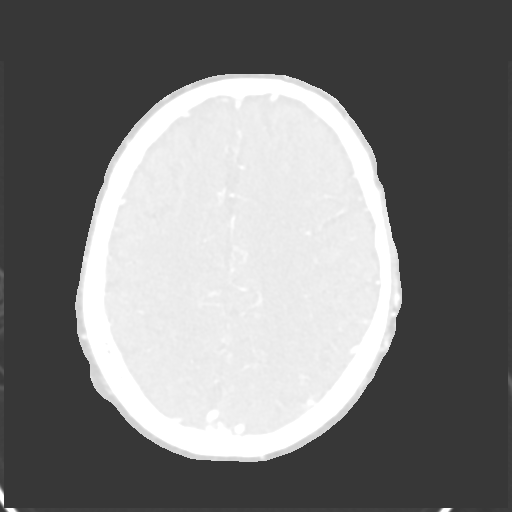

[Series 11: carotid mips (id) · axial · 0.49mm/px · z∈[-275,-145]mm · 2 of 79 slices shown]
[im 27/79  soft-tissue]
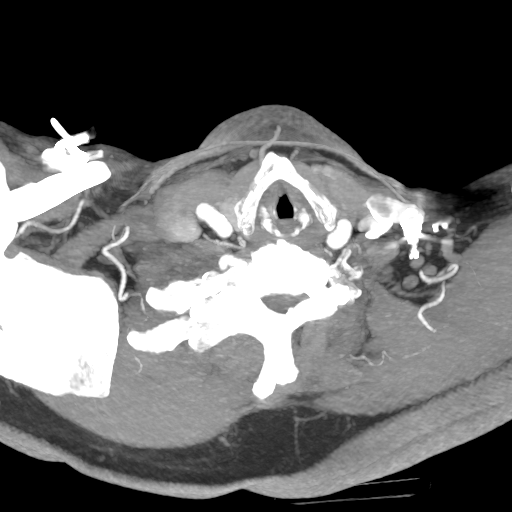
[im 53/79  soft-tissue]
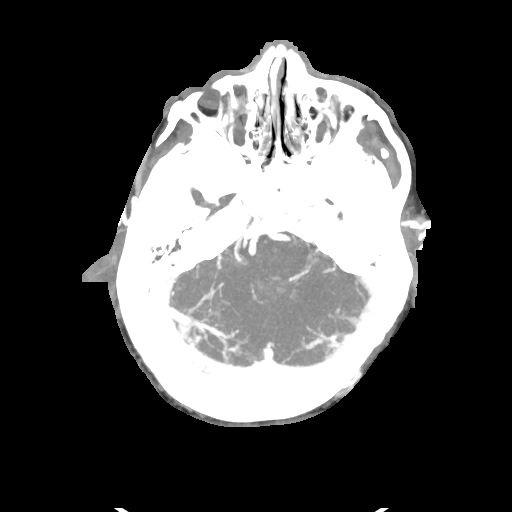

[8 of 46 positions shown; findings below may reference images not displayed]

FINDINGS: CTA NECK FINDINGS

Aortic arch: Visualized aortic arch of normal caliber with normal
branch pattern. Scattered atheromatous plaque within the arch and
visualized proximal descending intrathoracic aorta. No flow-limiting
stenosis about the origin of the great vessels. Atheromatous plaque
at the origin of the right subclavian artery with associated mild
approximate 30% stenosis. Visualized subclavian arteries otherwise
widely patent.

Right carotid system: Right CCA patent from its origin to the
bifurcation without stenosis. Mixed plaque at the right
bifurcation/proximal right ICA with associated stenosis of up to 60%
by NASCET criteria. Right ICA otherwise widely patent distally to
the skull base.

Left carotid system: Left CCA patent from its origin to the
bifurcation without stenosis. Bulky eccentric calcified plaque at
the left bifurcation/proximal left ICA with associated stenosis of
up to approximately 60% by NASCET criteria as well. Left ICA
otherwise widely patent distally to the skull base.

Vertebral arteries: Both of the vertebral arteries arise from the
subclavian arteries. Focal plaque at the origin of the right
vertebral artery with mild stenosis. Soft plaque at the proximal
right V1 segment with associated moderate approximate 50% stenosis.
Vertebral arteries otherwise widely patent within the neck.

Skeleton: No appreciable acute osseous abnormality. Thoracolumbar
scoliosis with multiple segmental anomalies noted. No worrisome
osseous lesions.

Other neck: No other acute soft tissue abnormality within the neck.
Approximate 3.2 cm heterogeneous right thyroid nodule (series 5,
image 50). Additional 15 mm left thyroid nodule (series 5, image
45).

Upper chest: Visualized upper chest demonstrates no acute finding.
Calcified granuloma noted within the anterior right upper lobe. Few
scattered ground-glass nodular densities within the peripheral right
upper lobe noted more superiorly, suspected to reflect mucoid
impaction and/or minimal small airways disease. 4 mm pleural based
right upper lobe nodule noted posteriorly (series 5, image 13).

Review of the MIP images confirms the above findings

CTA HEAD FINDINGS

Anterior circulation: Petrous segments widely patent bilaterally.
Cavernous and supraclinoid left ICA widely patent. Minimal
atheromatous plaque within the cavernous right ICA without stenosis.
Right ICA otherwise unremarkable widely patent to the terminus. A1
segments widely patent. Normal anterior communicating artery.
Anterior cerebral arteries widely patent to their distal aspects. No
M1 stenosis or occlusion. Distal MCA branches well perfused and
symmetric.

Posterior circulation: Focal non stenotic plaque within the right V4
segment just prior to the takeoff of the right PICA. Vertebral
arteries otherwise unremarkable widely patent vertebrobasilar
junction. Posterior inferior cerebral arteries patent bilaterally.
Basilar mildly tortuous but widely patent to its distal aspect.
Superior cerebral arteries patent bilaterally. Both of the posterior
cerebral arteries primarily supplied via the basilar and are widely
patent to their distal aspects. Small left posterior communicating
artery noted.

Venous sinuses: Patent.

Anatomic variants: None significant.

Delayed phase: No abnormal enhancement.

Review of the MIP images confirms the above findings
IMPRESSION: 1. Negative CTA for large vessel occlusion.
2. Approximate 60% atheromatous stenoses about the carotid
bifurcations/proximal ICAs bilaterally.
3. Mild stenosis at the origin of the left vertebral artery, with
more moderate 50% proximal right V1 stenosis.
4. Otherwise wide patency of the major arterial vasculature of the
head and neck.
5. 3.2 cm right thyroid and 1.5 cm left thyroid nodules. Follow-up
examination with nonemergent dedicated thyroid ultrasound
recommended for further characterization.
6. 4 mm pleural based right upper lobe nodule, indeterminate. No
follow-up needed if patient is low-risk. Non-contrast chest CT can
be considered in 12 months if patient is high-risk. This
recommendation follows the consensus statement: Guidelines for
Management of Incidental Pulmonary Nodules Detected on CT Images:

## 2020-10-30 IMAGING — MR MR MRA HEAD W/O CM
12 of 14 series · 34 of 48 positions shown · non-contrast
Comparison: None.

CLINICAL DATA: Right eye blurred vision. Transient left-sided
facial and arm numbness last week.

EXAM:
MRI HEAD WITHOUT CONTRAST
MRA HEAD WITHOUT CONTRAST
TECHNIQUE: Multiplanar, multiecho pulse sequences of the brain and surrounding
structures were obtained without intravenous contrast. Angiographic
images of the head were obtained using MRA technique without
contrast.

[Series 5: DWI · axial · 3.0mm · 0.88mm/px · z∈[-65,+93]mm · 7 of 108 slices shown (1 of 4)]
[im 1/108]
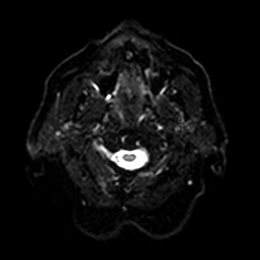
[im 18/108]
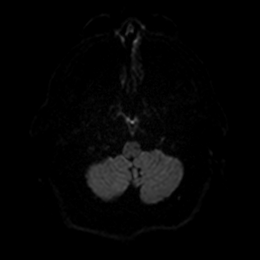
[im 36/108]
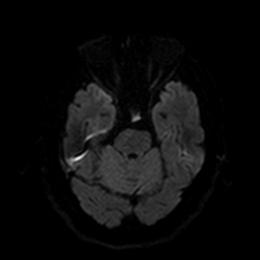
[im 54/108]
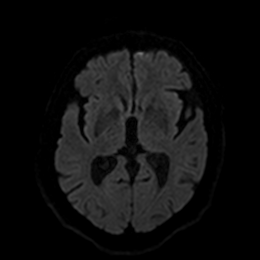
[im 72/108]
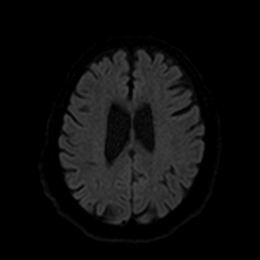
[im 90/108]
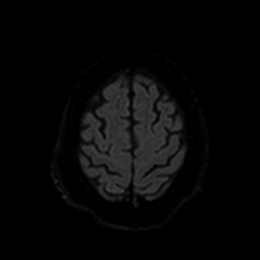
[im 108/108]
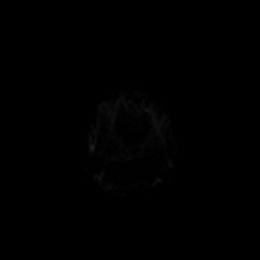

[Series 6: DWI · axial · 3.0mm · 0.88mm/px · z∈[-65,+93]mm · 4 of 54 slices shown (2 of 4)]
[im 1/54]
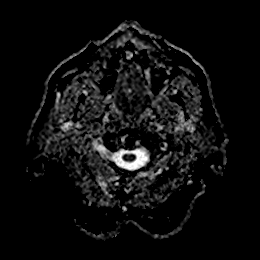
[im 18/54]
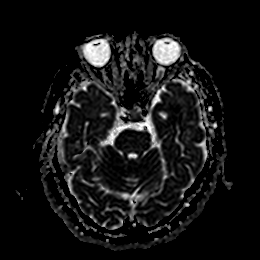
[im 36/54]
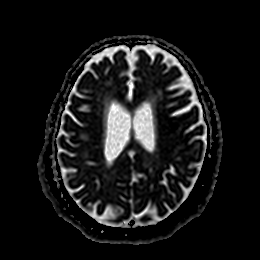
[im 54/54]
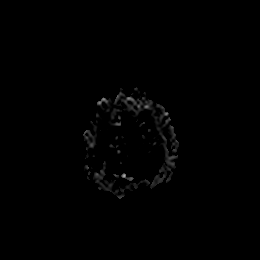

[Series 11: DWI · coronal · 4.0mm · 0.88mm/px · 4 of 76 slices shown (3 of 4)]
[im 1/76]
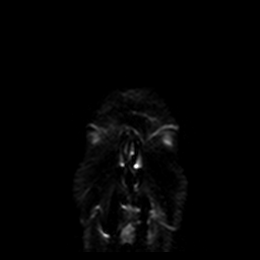
[im 26/76]
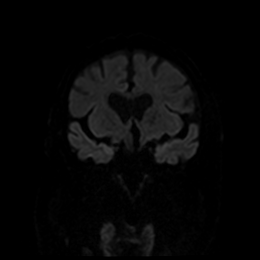
[im 51/76]
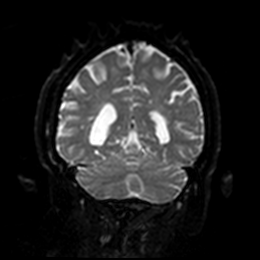
[im 76/76]
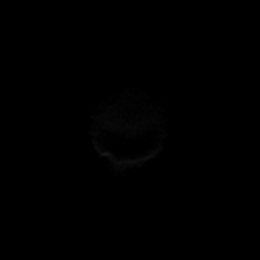

[Series 12: DWI · coronal · 4.0mm · 0.88mm/px · 2 of 38 slices shown (4 of 4)]
[im 1/38]
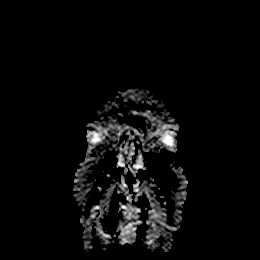
[im 38/38]
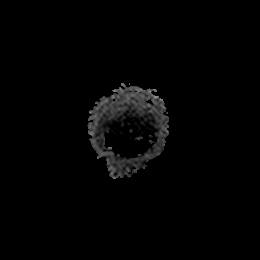

[Series 13: T1 · sagittal · 5.0mm · 0.75mm/px · 1 of 22 slices shown]
[im 1/22]
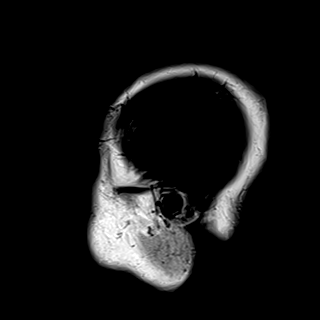

[Series 14: T2 · axial · 5.0mm · 0.72mm/px · 1 of 25 slices shown (1 of 2)]
[im 1/25]
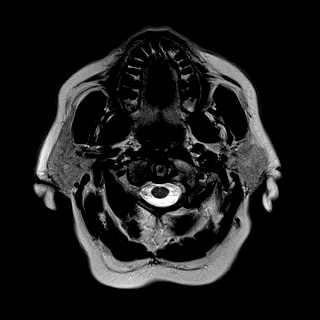

[Series 15: FLAIR · axial · 5.0mm · 0.45mm/px · 1 of 25 slices shown]
[im 1/25]
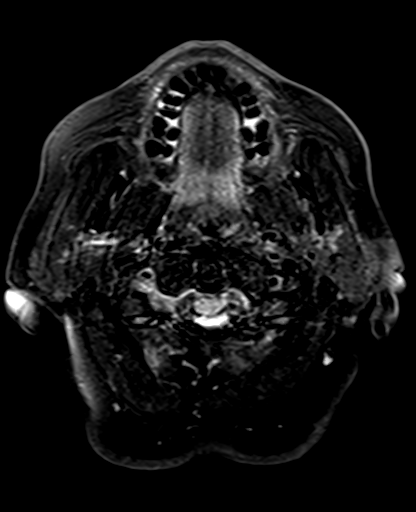

[Series 16: mag_images · axial · 3.0mm · 0.90mm/px · z∈[-83,+93]mm · 3 of 60 slices shown]
[im 1/60]
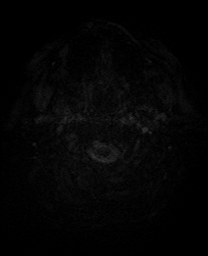
[im 30/60]
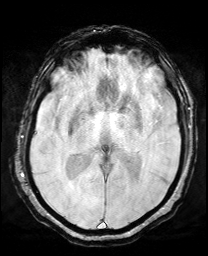
[im 60/60]
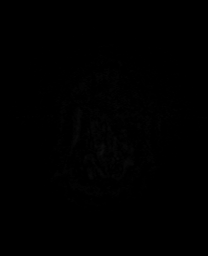

[Series 17: pha_images · axial · 3.0mm · 0.90mm/px · z∈[-83,+93]mm · 3 of 60 slices shown]
[im 1/60]
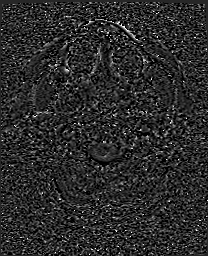
[im 30/60]
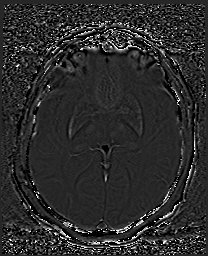
[im 60/60]
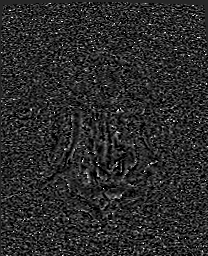

[Series 18: swi_images · axial · 3.0mm · 0.90mm/px · z∈[-83,+93]mm · 3 of 60 slices shown]
[im 1/60]
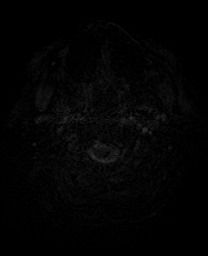
[im 30/60]
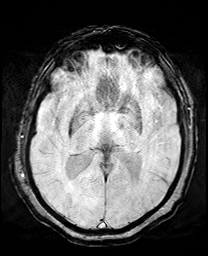
[im 60/60]
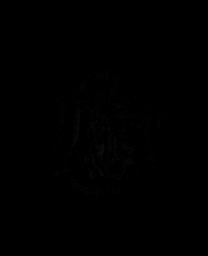

[Series 19: mip_images(sw) · axial · 24.0mm · 0.90mm/px · z∈[-72,+83]mm · 3 of 53 slices shown]
[im 1/53]
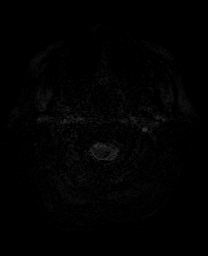
[im 27/53]
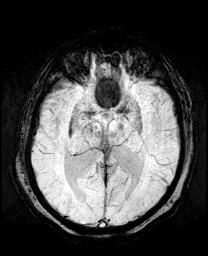
[im 53/53]
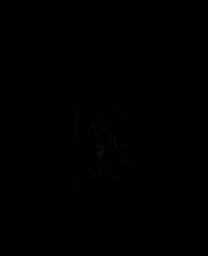

[Series 21: T2 · coronal · 5.0mm · 0.34mm/px · 2 of 29 slices shown (2 of 2)]
[im 1/29]
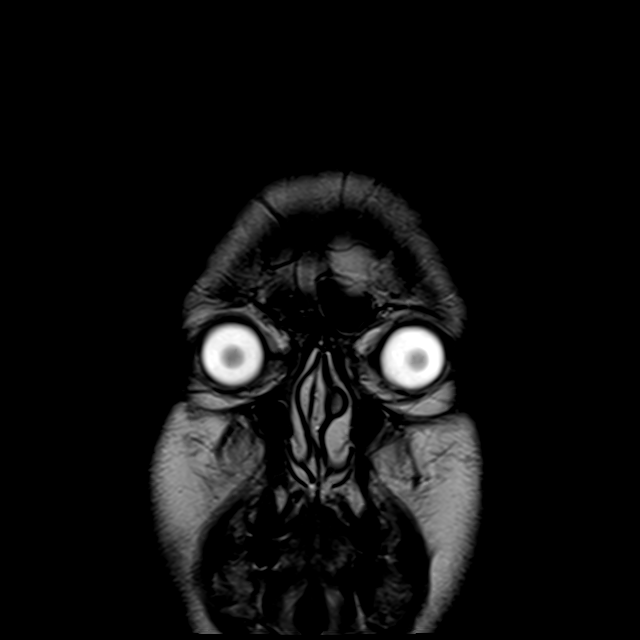
[im 29/29]
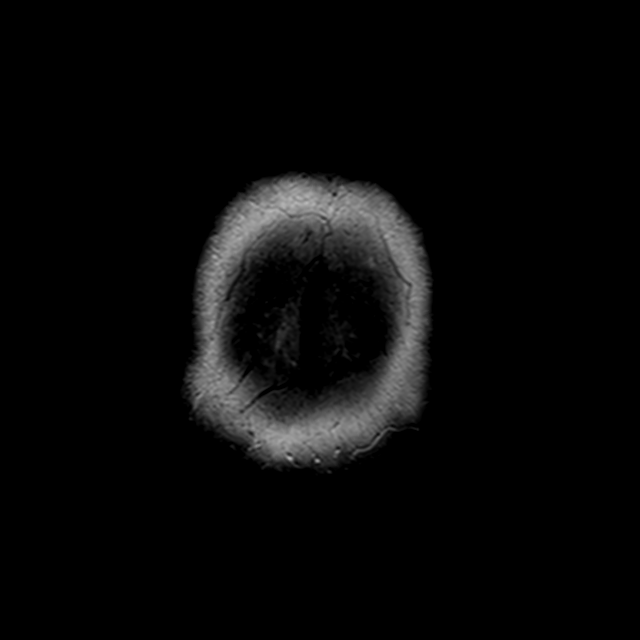

[34 of 48 positions shown; findings below may reference images not displayed]

FINDINGS: MRI HEAD FINDINGS

Brain: There are several punctate acute to early subacute cortical
infarcts in the right parietal and posterior right frontal lobes. No
intracranial hemorrhage, mass, midline shift, or extra-axial fluid
collection is identified. Generalized cerebral atrophy is mild for
age. Patchy T2 hyperintensities in the cerebral white matter
bilaterally are nonspecific but compatible with moderate chronic
small vessel ischemic disease.

Vascular: Major intracranial vascular flow voids are preserved.

Skull and upper cervical spine: Unremarkable bone marrow signal.

Sinuses/Orbits: Unremarkable orbits. Minimal left maxillary sinus
mucosal thickening. Clear mastoid air cells.

Other: None.

MRA HEAD FINDINGS

There is mild motion artifact.

The visualized distal vertebral arteries are patent to the basilar
and codominant. Patent PICA and SCA origins are seen bilaterally.
The basilar artery is widely patent. There is a small left posterior
communicating artery. A right posterior communicating artery is not
identified and may be diminutive or absent. Both PCAs are patent
without evidence of significant proximal stenosis.

The internal carotid arteries are patent from skull base to carotid
termini with suspected artifact rather than stenosis near the
proximal aspects of both petrous segments. ACAs and MCAs are patent
without evidence of proximal branch occlusion or significant
stenosis. No aneurysm is identified.
IMPRESSION: 1. Punctate acute to early subacute infarcts in the right parietal
and posterior frontal lobes.
2. Moderate chronic small vessel ischemic disease.
3. Negative head MRA.

## 2020-11-04 ENCOUNTER — Encounter: Payer: Self-pay | Admitting: Family Medicine

## 2020-11-04 ENCOUNTER — Other Ambulatory Visit: Payer: Self-pay

## 2020-11-04 ENCOUNTER — Ambulatory Visit (INDEPENDENT_AMBULATORY_CARE_PROVIDER_SITE_OTHER): Payer: Medicare Other | Admitting: Family Medicine

## 2020-11-04 VITALS — BP 148/82 | HR 56 | Temp 97.8°F | Wt 177.0 lb

## 2020-11-04 DIAGNOSIS — R001 Bradycardia, unspecified: Secondary | ICD-10-CM | POA: Diagnosis not present

## 2020-11-04 DIAGNOSIS — I6523 Occlusion and stenosis of bilateral carotid arteries: Secondary | ICD-10-CM

## 2020-11-04 DIAGNOSIS — I480 Paroxysmal atrial fibrillation: Secondary | ICD-10-CM | POA: Diagnosis not present

## 2020-11-04 DIAGNOSIS — I10 Essential (primary) hypertension: Secondary | ICD-10-CM

## 2020-11-04 MED ORDER — CARVEDILOL 3.125 MG PO TABS: 3.1250 mg | ORAL_TABLET | Freq: Two times a day (BID) | ORAL | 2 refills | Status: DC

## 2020-11-04 NOTE — Progress Notes (Signed)
   Subjective:    Patient ID: Luke [name], male    DOB: 23-Feb-1946, 75 y.o.   MRN: [id number]  HPI Here with concerns about low heart rates. He had an episode of atrial fibrillation  3 weeks ago, and he spontaneously converted to sinus. He has remained in sinus rhythm ever since. However since then he has had very slow heart rates. These were in the low 50's most of the past week and early this morning the HR dipped to 47. When this happens he feels weak and light headed. No chest pain or SOB. His BP has been stable in the 140's over 80's.   Review of Systems  Constitutional: Negative.   Respiratory: Negative.    Cardiovascular: Negative.   Neurological:  Positive for light-headedness.      Objective:   Physical Exam Constitutional:      Appearance: Normal appearance. He is not ill-appearing.  Cardiovascular:     Rate and Rhythm: Regular rhythm. Bradycardia present.     Pulses: Normal pulses.     Heart sounds: Normal heart sounds.  Pulmonary:     Effort: Pulmonary effort is normal.     Breath sounds: Normal breath sounds.  Neurological:     Mental Status: He is alert. Mental status is at baseline.          Assessment & Plan:  He is having some bradycardia without atrial fibrillation. The BP is stable. We agreed to decrease the Carvedilol to 3.125 mg BID. He will report back to [place] next week. He will see Cardiology on 12-07-20.  [name], MD

## 2020-11-08 ENCOUNTER — Encounter: Payer: Self-pay | Admitting: Family Medicine

## 2020-11-10 NOTE — Telephone Encounter (Signed)
I understand. Tell him to completely stop the Carvedilol. Report back in one week

## 2020-11-10 NOTE — Telephone Encounter (Signed)
Tell him to cut the Amlodipine in half to take 5 mg daily

## 2020-11-28 ENCOUNTER — Other Ambulatory Visit: Payer: Self-pay

## 2020-11-28 ENCOUNTER — Ambulatory Visit (INDEPENDENT_AMBULATORY_CARE_PROVIDER_SITE_OTHER): Payer: Medicare Other

## 2020-11-28 DIAGNOSIS — Z23 Encounter for immunization: Secondary | ICD-10-CM

## 2020-11-29 ENCOUNTER — Encounter: Payer: Self-pay | Admitting: Family Medicine

## 2020-11-29 ENCOUNTER — Telehealth: Payer: Self-pay | Admitting: Pharmacist

## 2020-11-29 NOTE — Telephone Encounter (Signed)
Yes he should go back on full strength medications until he sees Cardiology

## 2020-11-29 NOTE — Chronic Care Management (AMB) (Signed)
Chronic Care Management Pharmacy Assistant   Name: Luke [name]  MRN: [id number] DOB: Jan 06, 1946   Reason for Encounter: General Assessment   Conditions to be addressed/monitored: HTN  Recent office visits:  11-04-2020 Laurey Morale, MD - Patient presented for Bradycardia and other concerns. Changed Carvedilol to 3.125 mg oral 2 times with a meal daily  10-24-2020 Laurey Morale, MD - Patient presented for A-fib and other concerns. Changed amlodipine to 5 mg daily. Stopped Acyclovir 200 mg.  Recent consult visits:  None  Hospital visits:  Medication Reconciliation was completed by comparing discharge summary, patient's EMR and Pharmacy list, and upon discussion with patient.  Patient presented to [facility] on 10-11-2020 for Dizziness Patient was presnet for 2 hours.   New?Medications Started at [facility] Discharge:?? -started  None  Medication Changes at Hospital Discharge: -Changed  None Medications Discontinued at Hospital Discharge: -Stopped  None  Medications that remain the same after Hospital Discharge:??  -All other medications will remain the same.    Medications: Outpatient Encounter Medications as of 11/29/2020  Medication Sig Note   albuterol (VENTOLIN HFA) 108 (90 Base) MCG/ACT inhaler 1 puff    amLODipine (NORVASC) 10 MG tablet Take 0.5 tablets (5 mg total) by mouth daily.    apixaban (ELIQUIS) 5 MG TABS tablet Take 1 tablet (5 mg total) by mouth 2 (two) times daily for 30 days.    atorvastatin (LIPITOR) 10 MG tablet TAKE 1 TABLET AT BEDTIME    carvedilol (COREG) 3.125 MG tablet Take 1 tablet (3.125 mg total) by mouth 2 (two) times daily with a meal.    diltiazem (CARDIZEM) 30 MG tablet Take one tablet as needed for heart rates greater than 100    famotidine (PEPCID) 20 MG tablet Take 1 tablet (20 mg total) by mouth as needed for heartburn or indigestion.    losartan (COZAAR) 100 MG tablet TAKE 1 TABLET DAILY    sildenafil (VIAGRA)  50 MG tablet Take 1 tablet (50 mg total) by mouth as needed. 10/11/2020: As needed   No facility-administered encounter medications on file as of 11/29/2020.  Reviewed chart prior to disease state call. Spoke with patient regarding BP  Recent Office Vitals: BP Readings from Last 3 Encounters:  11/04/20 (!) 148/82  10/24/20 136/74  10/11/20 122/75   Pulse Readings from Last 3 Encounters:  11/04/20 (!) 56  10/24/20 68  10/11/20 60    Wt Readings from Last 3 Encounters:  11/04/20 177 lb (80.3 kg)  10/24/20 182 lb 6 oz (82.7 kg)  10/11/20 178 lb (80.7 kg)     Kidney Function Lab Results  Component Value Date/Time   CREATININE 1.01 10/11/2020 12:33 PM   CREATININE 0.99 03/15/2020 11:22 AM   GFR 75.31 03/15/2020 11:22 AM   GFRNONAA >60 10/11/2020 12:33 PM   GFRAA >60 04/19/2018 03:24 PM    BMP Latest Ref Rng & Units 10/11/2020 03/15/2020 01/25/2020  Glucose 70 - 99 mg/dL 125(H) 102(H) 122(H)  BUN 8 - 23 mg/dL 16 16 16   Creatinine 0.61 - 1.24 mg/dL 1.01 0.99 1.17  Sodium 135 - 145 mmol/L 140 140 139  Potassium 3.5 - 5.1 mmol/L 4.1 4.1 3.9  Chloride 98 - 111 mmol/L 106 107 103  CO2 22 - 32 mmol/L 25 26 25   Calcium 8.9 - 10.3 mg/dL 8.6(L) 8.8 8.8(L)    Current antihypertensive regimen:  Carvedilol 3.125 (patient reports he is taking half a dose to keep his pulse up) Amlodipine  10 mg  How often are you checking your Blood Pressure? Patient reports he is checking weekly Current home BP readings: 11-29-2020 167/95 patient reports since then readings are averaging 120/70 What recent interventions/DTPs have been made by any provider to improve Blood Pressure control since last CPP Visit: Patient reports he had started the half dose of carvedilol as his pulse had been in the lower range under 50 since halving the dose his pulse does not go under 59 or over 67 Any recent hospitalizations or ED visits since last visit with CPP? Yes Patient reports he did follow up with Dr Sarajane Jews after visit  with Pharmacist and as far as his sleeping patterns he is using tylenol PM at night and that has seemed to help for him.  Adherence Review: Is the patient currently on ACE/ARB medication? Yes Does the patient have >5 day gap between last estimated fill dates? No   Care Gaps: AWV last 10/21 - MSG sent to Ramond Craver CMA to schedule. Zoster Vaccine - Overdue COVID Booster#4 Therapist, music) - Overdue Flu Vaccine - Overdue  AWV - MSG sent to Ramond Craver CMA to schedule. CCM -03-07-21 BP- 142/82  Star Rating Drugs: Atorvastatin 10mg  - Last filled 10-03-2020 90 DS at [facility] Losartan 100 mg - Last filled 10-03-2020 90 DS at [facility] Pharmacist Assistant [phone]

## 2020-12-05 NOTE — Progress Notes (Signed)
Cardiology Office Note:    Date:  12/06/2020   ID:  Luke [name], Luke [name] 07-22-1945, MRN [id number]  PCP:  Laurey Morale, MD   [facility] Providers Cardiologist:  Minus Breeding, MD     Referring MD: Laurey Morale, MD   Follow-up for paroxysmal atrial fibrillation  History of Present Illness:    Luke [name] is a 75 y.o. male with a hx of asthma, depression, ADD, HLD, HTN, paroxysmal atrial fibrillation, and CVA.  He was seen by Dr. Percival Spanish on 08/24/2020.  He was previously found to have atrial fibrillation in the setting of URI.  He underwent ischemic work-up which was negative.  His echocardiogram was unremarkable.  He did not want to continue DOAC.  He had been admitted to the hospital with CVA.  It appeared to be embolic in nature.  He was subsequently placed on Eliquis.  He presented to the emergency department 11/21 and was found to be in atrial fibrillation.  He reportedly was in atrial fibrillation for about 10 hours.  His EKG confirmed his atrial fibrillation.  He converted to sinus rhythm while waiting in the waiting room.  He left without being seen.  On follow-up with Dr. Percival Spanish on 08/24/2020 he reported another episode of around 10 hours of atrial fibrillation.  The episode was in January 2022.  He did take Cardizem which provided relief.  His heart rate has been around 110 bpm.  He denied chest pain presyncope and syncope with the episode.  He was not active but denied other cardiac symptoms.  He denied arm neck back and chest discomfort.  He denied shortness of breath PND and orthopnea.  He denied palpitations.  He presented to the emergency department 10/11/2020.  He reported that he had gone into atrial fibrillation around 3:30 AM.  He reported irregular heartbeat.  His irregular heartbeat was making it hard for him to sleep.  He reported an episode of dizziness that lasted 2 to 3 seconds.  During the episode he felt as if his heart rate had increased.  During  the time of his exam he still reported an unusual feeling heartbeat.  He denied dizziness, shortness of breath, chest pain.  He denied chest discomfort throughout his course of irregular heartbeat.  He reported compliance with his Eliquis and no missed doses.  DCCV was discussed.  His CBC, BMP, troponin, and chest x-ray were normal.  He spontaneously converted to sinus rhythm at 4:15 PM.  He was able to ambulate without symptoms.  He was discharged in stable condition.  He presents to the clinic today for follow-up evaluation states he has noticed some increased fatigue while on carvedilol.  He visited his PCP who recommended that he continue his amlodipine and carvedilol until being seen by cardiology to make adjustments.  He did reduce his carvedilol to 3.125 mg twice daily.  He continued his amlodipine at 10 mg daily due to his blood pressure increasing into the 140s.  He has not been on any other type of beta-blocker previously.  He denies recurrence of accelerated or irregular heartbeat.  He is cardiac aware.  He does notice his heartbeat at night when he lays down to go to sleep.  We discussed this and I reassured okay.  I have asked him to avoid triggers for atrial fibrillation and we briefly discussed obstructive sleep apnea.  He does not believe he snores and or suffers from sleep apnea.  I will stop his carvedilol and  start metoprolol succinate 12.5 mg daily.  We will plan to have him follow-up in 1 month.  Today he denies chest pain,  lower extremity edema,  palpitations, melena, hematuria, hemoptysis, diaphoresis, weakness, presyncope, syncope, orthopnea, and PND.   Past Medical History:  Diagnosis Date   Asthma    Depression    ED (erectile dysfunction)    Hyperlipidemia    Hypertension    Paroxysmal atrial fibrillation ([place]) 09/2016   Initial diagnosis was in setting of acute URI/asthma attack   Solitary pulmonary nodule 04/16/2013   03/30/13  CXR Question left nipple shadow.  10 mm  ovoid nodular density right upper lobe, cannot exclude  pulmonary mass/ nodule > f/u cxr 04/29/2013 > no nodule     Stroke [facility])    Ulcer     Past Surgical History:  Procedure Laterality Date   COLONOSCOPY  05/15/2019   per Dr. Wilford Corner, clear, no repeats needed    NM MYOVIEW LTD  10/2016   ailable, images/films reviewed: From Epic Chart or Care Everywhere)   TRANSTHORACIC ECHOCARDIOGRAM  46/6599   Normal systolic function. EF 5560%. No RWMA. Gr 1 DD. Mild AoV calcification - no stenosis. Atrial Septum - lipomatous hypertrophy. --Essentially normal.    Current Medications: No outpatient medications have been marked as taking for the 12/07/20 encounter (Office Visit) with Deberah Pelton, NP.     Allergies:   Aspirin, Hydrochlorothiazide, Irbesartan, and Symbicort [budesonide-formoterol fumarate]   Social History   Socioeconomic History   Marital status: Married    Spouse name: Not on file   Number of children: Not on file   Years of education: Not on file   Highest education level: Not on file  Occupational History   Occupation: Retired Product manager: RETIRED  Tobacco Use   Smoking status: Never   Smokeless tobacco: Never  Vaping Use   Vaping Use: Never used  Substance and Sexual Activity   Alcohol use: Yes    Alcohol/week: 2.0 standard drinks    Types: 2 Standard drinks or equivalent per week    Comment: socially occasionally    Drug use: No   Sexual activity: Not on file  Other Topics Concern   Not on file  Social History Narrative   Not on file   Social Determinants of Health   Financial Resource Strain: Low Risk    Difficulty of Paying Living Expenses: Not hard at all  Food Insecurity: No Food Insecurity   Worried About Charity fundraiser in the Last Year: Never true   [place] in the Last Year: Never true  Transportation Needs: No Transportation Needs   Lack of Transportation (Medical): No   Lack of Transportation (Non-Medical): No   Physical Activity: Insufficiently Active   Days of Exercise per Week: 2 days   Minutes of Exercise per Session: 60 min  Stress: No Stress Concern Present   Feeling of Stress : Not at all  Social Connections: Moderately Integrated   Frequency of Communication with Friends and Family: More than three times a week   Frequency of Social Gatherings with Friends and Family: Once a week   Attends Religious Services: More than 4 times per year   Active Member of [employer] or Organizations: No   Attends Archivist Meetings: Never   Marital Status: Married     Family History: The patient's family history includes Cancer in his brother; Coronary artery disease in his brother and father; Emphysema  in his brother; Hyperlipidemia in his father; Hypertension in his brother and mother; Lung cancer in his brother; Peripheral vascular disease in his father and mother; Stroke in his mother.  ROS:   Please see the history of present illness.     All other systems reviewed and are negative.   Risk Assessment/Calculations:    CHA2DS2-VASc Score = 4    This indicates a 4.8% annual risk of stroke. The patient's score is based upon: CHF History: 0 HTN History: 1 Diabetes History: 0 Stroke History: 2 Vascular Disease History: 0 Age Score: 1 Gender Score: 0         Physical Exam:    VS:  There were no vitals taken for this visit.    Wt Readings from Last 3 Encounters:  11/04/20 177 lb (80.3 kg)  10/24/20 182 lb 6 oz (82.7 kg)  10/11/20 178 lb (80.7 kg)     GEN:  Well nourished, well developed in no acute distress HEENT: Normal NECK: No JVD; No carotid bruits LYMPHATICS: No lymphadenopathy CARDIAC: RRR, no murmurs, rubs, gallops RESPIRATORY:  Clear to auscultation without rales, wheezing or rhonchi  ABDOMEN: Soft, non-tender, non-distended MUSCULOSKELETAL:  No edema; No deformity  SKIN: Warm and dry NEUROLOGIC:  Alert and oriented x 3 PSYCHIATRIC:  Normal affect     EKGs/Labs/Other Studies Reviewed:    The following studies were reviewed today:  Echocardiogram 04/20/2018 IMPRESSIONS     1. The left ventricle has normal systolic function with an ejection  fraction of 60-65%. The cavity size was normal. Left ventricular diastolic  Doppler parameters are consistent with impaired relaxation No evidence of  left ventricular regional wall  motion abnormalities.   2. The right ventricle has normal systolic function. The cavity was  normal. There is no increase in right ventricular wall thickness.   3. The mitral valve is normal in structure.   4. The tricuspid valve is normal in structure.   5. The aortic valve is tricuspid Moderate thickening of the aortic valve  Moderate calcification of the aortic valve.   6. The pulmonic valve was normal in structure.   7. No evidence of left ventricular regional wall motion abnormalities.   8. Right atrial pressure is estimated at 3 mmHg.   EKG:  EKG is not ordered today.    Recent Labs: 03/15/2020: ALT 14; TSH 1.80 10/11/2020: BUN 16; Creatinine, Ser 1.01; Hemoglobin 14.0; Platelets 339; Potassium 4.1; Sodium 140  Recent Lipid Panel    Component Value Date/Time   CHOL 139 03/15/2020 1122   TRIG 176.0 (H) 03/15/2020 1122   HDL 35.40 (L) 03/15/2020 1122   CHOLHDL 4 03/15/2020 1122   VLDL 35.2 03/15/2020 1122   LDLCALC 69 03/15/2020 1122   LDLDIRECT 73.0 02/08/2015 0906    ASSESSMENT & PLAN    Paroxysmal atrial fibrillation-heart rate today 74.  Reported to the emergency department on 10/11/2020.  Patient had not been able to sleep due to irregular heartbeat.  He took 2 doses his Cardizem medication which did not convert him back to sinus rhythm.  He reported to the emergency room.  Plans were discussed for DCCV.  He spontaneously converted and was discharged in stable condition.  Continues to report compliance with apixaban and denies bleeding issues.  Due to his increased fatigue we will discontinue his  carvedilol and start him on metoprolol succinate Continue  Cardizem, apixaban Stop carvedilol Start metoprolol succinate 12.5 mg daily Heart healthy low-sodium diet-salty 6 given Increase physical activity as tolerated  Avoid triggers caffeine, chocolate, EtOH, dehydration etc.  Essential hypertension-BP today 124/68.  Well-controlled at home. Continue carvedilol, amlodipine, losartan Heart healthy low-sodium diet-salty 6 given Increase physical activity as tolerated Maintain blood pressure log  Hyperlipidemia-03/15/2020: Cholesterol 139; HDL 35.40; LDL Cholesterol 69; Triglycerides 176.0; VLDL 35.2 Continue atorvastatin Heart healthy low-sodium high-fiber diet Increase physical activity as tolerated Repeat fasting lipids and LFTs 1/23  Carotid artery stenosis-carotid Doppler showed 60-79% right carotid stenosis and 40-59% left stenosis [facility]   Disposition: Follow-up with Dr. Percival Spanish or me in 1 month.        Medication Adjustments/Labs and Tests Ordered: Current medicines are reviewed at length with the patient today.  Concerns regarding medicines are outlined above.  No orders of the defined types were placed in this encounter.  No orders of the defined types were placed in this encounter.   There are no Patient Instructions on file for this visit.   Signed, Deberah Pelton, NP  12/06/2020 6:30 AM      Notice: This dictation was prepared with Dragon dictation along with smaller phrase technology. Any transcriptional errors that result from this process are unintentional and may not be corrected upon review.  I spent 14 minutes examining this patient, reviewing medications, and using patient centered shared decision making involving her cardiac care.  Prior to her visit I spent greater than 20 minutes reviewing her past medical history,  medications, and prior cardiac tests.

## 2020-12-07 ENCOUNTER — Other Ambulatory Visit: Payer: Self-pay

## 2020-12-07 ENCOUNTER — Encounter (HOSPITAL_BASED_OUTPATIENT_CLINIC_OR_DEPARTMENT_OTHER): Payer: Self-pay | Admitting: General Practice

## 2020-12-07 ENCOUNTER — Ambulatory Visit (INDEPENDENT_AMBULATORY_CARE_PROVIDER_SITE_OTHER): Payer: Medicare Other | Admitting: General Practice

## 2020-12-07 VITALS — BP 124/68 | HR 74 | Ht 67.0 in | Wt 177.6 lb

## 2020-12-07 DIAGNOSIS — I10 Essential (primary) hypertension: Secondary | ICD-10-CM

## 2020-12-07 DIAGNOSIS — I480 Paroxysmal atrial fibrillation: Secondary | ICD-10-CM | POA: Diagnosis not present

## 2020-12-07 DIAGNOSIS — I6523 Occlusion and stenosis of bilateral carotid arteries: Secondary | ICD-10-CM | POA: Diagnosis not present

## 2020-12-07 DIAGNOSIS — E785 Hyperlipidemia, unspecified: Secondary | ICD-10-CM

## 2020-12-07 MED ORDER — METOPROLOL SUCCINATE ER 25 MG PO TB24: 12.5000 mg | ORAL_TABLET | Freq: Every day | ORAL | 1 refills | Status: DC

## 2020-12-07 NOTE — Patient Instructions (Addendum)
Medication Instructions:   STOP Carvedilol  START Metoprolol Succinate (TOPROL-XL) 25 mg---take 1/2 tab (12.5 mg) daily.  *If you need a refill on your cardiac medications before your next appointment, please call your pharmacy*   Follow-Up: At [facility], you and your health needs are our priority.  As part of our continuing mission to provide you with exceptional heart care, we have created designated Provider Care Teams.  These Care Teams include your primary Cardiologist (physician) and Advanced Practice Providers (APPs -  Physician Assistants and Nurse Practitioners) who all work together to provide you with the care you need, when you need it.  We recommend signing up for the patient portal called "MyChart".  Sign up information is provided on this After Visit Summary.  MyChart is used to connect with patients for Virtual Visits (Telemedicine).  Patients are able to view lab/test results, encounter notes, upcoming appointments, etc.  Non-urgent messages can be sent to your provider as well.   To learn more about what you can do with MyChart, go to [email or web address].    Your next appointment:   1 month(s)  The format for your next appointment:   In Person  Provider:   Coletta Memos, NP   Other Instructions  Coletta Memos, NP has recommended that you maintain your current physical activity and avoid triggers as listed below:  Please try to avoid these triggers: Do not use any products that have nicotine or tobacco in them. These include cigarettes, e-cigarettes, and chewing tobacco. If you need help quitting, ask your doctor. Do not use drugs, including cannabis Do not drink alcohol, Caffeine or chocolate.  Eat heart-healthy foods. Talk with your doctor about the right eating plan for you. Exercise regularly as told by your doctor. Stay hydrated Lose weight if you are overweight.  Atrial Fibrillation Atrial fibrillation is a type of heartbeat that is irregular or  fast. If you have this condition, your heart beats without any order. This makes it hard for your heart to pump blood in a normal way. Atrial fibrillation may come and go, or it may become a long-lasting problem. If this condition is not treated, it can put you at higher risk for stroke, heart failure, and other heart problems. What are the causes? This condition may be caused by diseases that damage the heart. They include: High blood pressure. Heart failure. Heart valve disease. Heart surgery. Other causes include: Diabetes. Thyroid disease. Being overweight. Kidney disease. Sometimes the cause is not known. What increases the risk? You are more likely to develop this condition if: You are older. You smoke. You exercise often and very hard. You have a family history of this condition. You are a man. You use drugs. You drink a lot of alcohol. You have lung conditions, such as emphysema, pneumonia, or COPD. You have sleep apnea. What are the signs or symptoms? Common symptoms of this condition include: A feeling that your heart is beating very fast. Chest pain or discomfort. Feeling short of breath. Suddenly feeling light-headed or weak. Getting tired easily during activity. Fainting. Sweating. In some cases, there are no symptoms. How is this treated? Treatment for this condition depends on underlying conditions and how you feel when you have atrial fibrillation. They include: Medicines to: Prevent blood clots. Treat heart rate or heart rhythm problems. Using devices, such as a pacemaker, to correct heart rhythm problems. Doing surgery to remove the part of the heart that sends bad signals. Closing an area where clots can form  in the heart (left atrial appendage). In some cases, your doctor will treat other underlying conditions. Follow these instructions at home: Medicines Take over-the-counter and prescription medicines only as told by your doctor. Do not take any  new medicines without first talking to your doctor. If you are taking blood thinners: Talk with your doctor before you take any medicines that have aspirin or NSAIDs, such as ibuprofen, in them. Take your medicine exactly as told by your doctor. Take it at the same time each day. Avoid activities that could hurt or bruise you. Follow instructions about how to prevent falls. Wear a bracelet that says you are taking blood thinners. Or, carry a card that lists what medicines you take. Lifestyle   Do not use any products that have nicotine or tobacco in them. These include cigarettes, e-cigarettes, and chewing tobacco. If you need help quitting, ask your doctor. Eat heart-healthy foods. Talk with your doctor about the right eating plan for you. Exercise regularly as told by your doctor. Do not drink alcohol. Lose weight if you are overweight. Do not use drugs, including cannabis. General instructions If you have a condition that causes breathing to stop for a short period of time (apnea), treat it as told by your doctor. Keep a healthy weight. Do not use diet pills unless your doctor says they are safe for you. Diet pills may make heart problems worse. Keep all follow-up visits as told by your doctor. This is important. Contact a doctor if: You notice a change in the speed, rhythm, or strength of your heartbeat. You are taking a blood-thinning medicine and you get more bruising. You get tired more easily when you move or exercise. You have a sudden change in weight. Get help right away if:  You have pain in your chest or your belly (abdomen). You have trouble breathing. You have side effects of blood thinners, such as blood in your vomit, poop (stool), or pee (urine), or bleeding that cannot stop. You have any signs of a stroke. "BE FAST" is an easy way to remember the main warning signs: B - Balance. Signs are dizziness, sudden trouble walking, or loss of balance. E - Eyes. Signs are  trouble seeing or a change in how you see. F - Face. Signs are sudden weakness or loss of feeling in the face, or the face or eyelid drooping on one side. A - Arms. Signs are weakness or loss of feeling in an arm. This happens suddenly and usually on one side of the body. S - Speech. Signs are sudden trouble speaking, slurred speech, or trouble understanding what people say. T - Time. Time to call emergency services. Write down what time symptoms started. You have other signs of a stroke, such as: A sudden, very bad headache with no known cause. Feeling like you may vomit (nausea). Vomiting. A seizure. These symptoms may be an emergency. Do not wait to see if the symptoms will go away. Get medical help right away. Call your local emergency services (911 in the [place] Do not drive yourself to the hospital. Summary Atrial fibrillation is a type of heartbeat that is irregular or fast. You are at higher risk of this condition if you smoke, are older, have diabetes, or are overweight. Follow your doctor's instructions about medicines, diet, exercise, and follow-up visits. Get help right away if you have signs or symptoms of a stroke. Get help right away if you cannot catch your breath, or you have chest pain  or discomfort. This information is not intended to replace advice given to you by your health care provider. Make sure you discuss any questions you have with your health care provider. Document Revised: 08/13/2018 Document Reviewed: 08/13/2018 Elsevier Patient Education  [place].

## 2020-12-14 ENCOUNTER — Other Ambulatory Visit: Payer: Self-pay | Admitting: Family Medicine

## 2020-12-16 ENCOUNTER — Encounter (HOSPITAL_BASED_OUTPATIENT_CLINIC_OR_DEPARTMENT_OTHER): Payer: Self-pay

## 2020-12-22 NOTE — Telephone Encounter (Signed)
Lm to call back ./cy 

## 2020-12-28 ENCOUNTER — Ambulatory Visit (INDEPENDENT_AMBULATORY_CARE_PROVIDER_SITE_OTHER): Payer: Medicare Other

## 2020-12-28 DIAGNOSIS — Z0000 Encounter for general adult medical examination without abnormal findings: Secondary | ICD-10-CM

## 2020-12-28 NOTE — Patient Instructions (Signed)
Mr. Luke [name] , Thank you for taking time to come for your Medicare Wellness Visit. I appreciate your ongoing commitment to your health goals. Please review the following plan we discussed and let me know if I can assist you in the future.   Screening recommendations/referrals: Colonoscopy: 05/15/2019  due 2026 Recommended yearly ophthalmology/optometry visit for glaucoma screening and checkup Recommended yearly dental visit for hygiene and checkup  Vaccinations: Influenza vaccine: completed  Pneumococcal vaccine: completed  Tdap vaccine: 11/27/2016 Shingles vaccine: declined     Advanced directives: will provide copies   Conditions/risks identified: none   Next appointment: none   Preventive Care 24 Years and Older, Male Preventive care refers to lifestyle choices and visits with your health care provider that can promote health and wellness. What does preventive care include? A yearly physical exam. This is also called an annual well check. Dental exams once or twice a year. Routine eye exams. Ask your health care provider how often you should have your eyes checked. Personal lifestyle choices, including: Daily care of your teeth and gums. Regular physical activity. Eating a healthy diet. Avoiding tobacco and drug use. Limiting alcohol use. Practicing safe sex. Taking low doses of aspirin every day. Taking vitamin and mineral supplements as recommended by your health care provider. What happens during an annual well check? The services and screenings done by your health care provider during your annual well check will depend on your age, overall health, lifestyle risk factors, and family history of disease. Counseling  Your health care provider may ask you questions about your: Alcohol use. Tobacco use. Drug use. Emotional well-being. Home and relationship well-being. Sexual activity. Eating habits. History of falls. Memory and ability to understand (cognition). Work  and work Statistician. Screening  You may have the following tests or measurements: Height, weight, and BMI. Blood pressure. Lipid and cholesterol levels. These may be checked every 5 years, or more frequently if you are over 59 years old. Skin check. Lung cancer screening. You may have this screening every year starting at age 61 if you have a 30-pack-year history of smoking and currently smoke or have quit within the past 15 years. Fecal occult blood test (FOBT) of the stool. You may have this test every year starting at age 12. Flexible sigmoidoscopy or colonoscopy. You may have a sigmoidoscopy every 5 years or a colonoscopy every 10 years starting at age 10. Prostate cancer screening. Recommendations will vary depending on your family history and other risks. Hepatitis C blood test. Hepatitis B blood test. Sexually transmitted disease (STD) testing. Diabetes screening. This is done by checking your blood sugar (glucose) after you have not eaten for a while (fasting). You may have this done every 1-3 years. Abdominal aortic aneurysm (AAA) screening. You may need this if you are a current or former smoker. Osteoporosis. You may be screened starting at age 29 if you are at high risk. Talk with your health care provider about your test results, treatment options, and if necessary, the need for more tests. Vaccines  Your health care provider may recommend certain vaccines, such as: Influenza vaccine. This is recommended every year. Tetanus, diphtheria, and acellular pertussis (Tdap, Td) vaccine. You may need a Td booster every 10 years. Zoster vaccine. You may need this after age 72. Pneumococcal 13-valent conjugate (PCV13) vaccine. One dose is recommended after age 36. Pneumococcal polysaccharide (PPSV23) vaccine. One dose is recommended after age 54. Talk to your health care provider about which screenings and vaccines you need  and how often you need them. This information is not intended  to replace advice given to you by your health care provider. Make sure you discuss any questions you have with your health care provider. Document Released: 03/18/2015 Document Revised: 11/09/2015 Document Reviewed: 12/21/2014 Elsevier Interactive Patient Education  2017 [employer] Prevention in the Home Falls can cause injuries. They can happen to people of all ages. There are many things you can do to make your home safe and to help prevent falls. What can I do on the outside of my home? Regularly fix the edges of walkways and driveways and fix any cracks. Remove anything that might make you trip as you walk through a door, such as a raised step or threshold. Trim any bushes or trees on the path to your home. Use bright outdoor lighting. Clear any walking paths of anything that might make someone trip, such as rocks or tools. Regularly check to see if handrails are loose or broken. Make sure that both sides of any steps have handrails. Any raised decks and porches should have guardrails on the edges. Have any leaves, snow, or ice cleared regularly. Use sand or salt on walking paths during winter. Clean up any spills in your garage right away. This includes oil or grease spills. What can I do in the bathroom? Use night lights. Install grab bars by the toilet and in the tub and shower. Do not use towel bars as grab bars. Use non-skid mats or decals in the tub or shower. If you need to sit down in the shower, use a plastic, non-slip stool. Keep the floor dry. Clean up any water that spills on the floor as soon as it happens. Remove soap buildup in the tub or shower regularly. Attach bath mats securely with double-sided non-slip rug tape. Do not have throw rugs and other things on the floor that can make you trip. What can I do in the bedroom? Use night lights. Make sure that you have a light by your bed that is easy to reach. Do not use any sheets or blankets that are too big  for your bed. They should not hang down onto the floor. Have a firm chair that has side arms. You can use this for support while you get dressed. Do not have throw rugs and other things on the floor that can make you trip. What can I do in the kitchen? Clean up any spills right away. Avoid walking on wet floors. Keep items that you use a lot in easy-to-reach places. If you need to reach something above you, use a strong step stool that has a grab bar. Keep electrical cords out of the way. Do not use floor polish or wax that makes floors slippery. If you must use wax, use non-skid floor wax. Do not have throw rugs and other things on the floor that can make you trip. What can I do with my stairs? Do not leave any items on the stairs. Make sure that there are handrails on both sides of the stairs and use them. Fix handrails that are broken or loose. Make sure that handrails are as long as the stairways. Check any carpeting to make sure that it is firmly attached to the stairs. Fix any carpet that is loose or worn. Avoid having throw rugs at the top or bottom of the stairs. If you do have throw rugs, attach them to the floor with carpet tape. Make sure that you  have a light switch at the top of the stairs and the bottom of the stairs. If you do not have them, ask someone to add them for you. What else can I do to help prevent falls? Wear shoes that: Do not have high heels. Have rubber bottoms. Are comfortable and fit you well. Are closed at the toe. Do not wear sandals. If you use a stepladder: Make sure that it is fully opened. Do not climb a closed stepladder. Make sure that both sides of the stepladder are locked into place. Ask someone to hold it for you, if possible. Clearly mark and make sure that you can see: Any grab bars or handrails. First and last steps. Where the edge of each step is. Use tools that help you move around (mobility aids) if they are needed. These  include: Canes. Walkers. Scooters. Crutches. Turn on the lights when you go into a dark area. Replace any light bulbs as soon as they burn out. Set up your furniture so you have a clear path. Avoid moving your furniture around. If any of your floors are uneven, fix them. If there are any pets around you, be aware of where they are. Review your medicines with your doctor. Some medicines can make you feel dizzy. This can increase your chance of falling. Ask your doctor what other things that you can do to help prevent falls. This information is not intended to replace advice given to you by your health care provider. Make sure you discuss any questions you have with your health care provider. Document Released: 12/16/2008 Document Revised: 07/28/2015 Document Reviewed: 03/26/2014 Elsevier Interactive Patient Education  2017 [employer].

## 2020-12-28 NOTE — Progress Notes (Signed)
Subjective:   Luke [name] is a 75 y.o. male who presents for an Subsequent Medicare Annual Wellness Visit.  I connected with Luke [name] today by telephone and verified that I am speaking with the correct person using two identifiers. Location patient: home Location provider: work Persons participating in the virtual visit: patient, provider.   I discussed the limitations, risks, security and privacy concerns of performing an evaluation and management service by telephone and the availability of in person appointments. I also discussed with the patient that there may be a patient responsible charge related to this service. The patient expressed understanding and verbally consented to this telephonic visit.    Interactive audio and video telecommunications were attempted between this provider and patient, however failed, due to patient having technical difficulties OR patient did not have access to video capability.  We continued and completed visit with audio only.    Review of Systems     Cardiac Risk Factors include: advanced age (>29men, >21 women);hypertension;male gender     Objective:    Today's Vitals   There is no height or weight on file to calculate BMI.  Advanced Directives 12/28/2020 10/11/2020 01/25/2020 12/23/2019 04/20/2018 09/25/2016 09/25/2016  Does Patient Have a Medical Advance Directive? Yes No No No No No No  Type of Paramedic of [place];Living will - - - - - -  Copy of [place] in Chart? No - copy requested - - - - - -  Would patient like information on creating a medical advance directive? - - No - Patient declined No - Patient declined Yes (ED - Information included in AVS) No - Patient declined -    Current Medications (verified) Outpatient Encounter Medications as of 12/28/2020  Medication Sig   albuterol (VENTOLIN HFA) 108 (90 Base) MCG/ACT inhaler 1 puff   amLODipine (NORVASC) 10 MG tablet Take 0.5  tablets (5 mg total) by mouth daily. (Patient taking differently: Take 10 mg by mouth daily.)   atorvastatin (LIPITOR) 10 MG tablet TAKE 1 TABLET AT BEDTIME   diltiazem (CARDIZEM) 30 MG tablet Take one tablet as needed for heart rates greater than 100   famotidine (PEPCID) 20 MG tablet Take 1 tablet (20 mg total) by mouth as needed for heartburn or indigestion.   losartan (COZAAR) 100 MG tablet TAKE 1 TABLET DAILY   metoprolol succinate (TOPROL-XL) 25 MG 24 hr tablet Take 0.5 tablets (12.5 mg total) by mouth daily.   sildenafil (VIAGRA) 50 MG tablet Take 1 tablet (50 mg total) by mouth as needed.   apixaban (ELIQUIS) 5 MG TABS tablet Take 1 tablet (5 mg total) by mouth 2 (two) times daily for 30 days.   No facility-administered encounter medications on file as of 12/28/2020.    Allergies (verified) Aspirin, Hydrochlorothiazide, Irbesartan, and Symbicort [budesonide-formoterol fumarate]   History: Past Medical History:  Diagnosis Date   Asthma    Depression    ED (erectile dysfunction)    Hyperlipidemia    Hypertension    Paroxysmal atrial fibrillation ([place]) 09/2016   Initial diagnosis was in setting of acute URI/asthma attack   Solitary pulmonary nodule 04/16/2013   03/30/13  CXR Question left nipple shadow.  10 mm ovoid nodular density right upper lobe, cannot exclude  pulmonary mass/ nodule > f/u cxr 04/29/2013 > no nodule     Stroke [facility])    Ulcer    Past Surgical History:  Procedure Laterality Date   COLONOSCOPY  05/15/2019  per Dr. Wilford Corner, clear, no repeats needed    NM MYOVIEW LTD  10/2016   ailable, images/films reviewed: From Epic Chart or Care Everywhere)   TRANSTHORACIC ECHOCARDIOGRAM  71/2458   Normal systolic function. EF 5560%. No RWMA. Gr 1 DD. Mild AoV calcification - no stenosis. Atrial Septum - lipomatous hypertrophy. --Essentially normal.   Family History  Problem Relation Age of Onset   Stroke Mother    Hypertension Mother    Peripheral vascular  disease Mother    Hyperlipidemia Father    Coronary artery disease Father    Peripheral vascular disease Father    Hypertension Brother    Coronary artery disease Brother    Cancer Brother        throat, smoked   Emphysema Brother        smoked   Lung cancer Brother        smoked   Social History   Socioeconomic History   Marital status: Married    Spouse name: Not on file   Number of children: Not on file   Years of education: Not on file   Highest education level: Not on file  Occupational History   Occupation: Retired Product manager: RETIRED  Tobacco Use   Smoking status: Never   Smokeless tobacco: Never  Vaping Use   Vaping Use: Never used  Substance and Sexual Activity   Alcohol use: Yes    Alcohol/week: 2.0 standard drinks    Types: 2 Standard drinks or equivalent per week    Comment: socially occasionally    Drug use: No   Sexual activity: Not on file  Other Topics Concern   Not on file  Social History Narrative   Not on file   Social Determinants of Health   Financial Resource Strain: Low Risk    Difficulty of Paying Living Expenses: Not hard at all  Food Insecurity: No Food Insecurity   Worried About Charity fundraiser in the Last Year: Never true   [place] in the Last Year: Never true  Transportation Needs: No Transportation Needs   Lack of Transportation (Medical): No   Lack of Transportation (Non-Medical): No  Physical Activity: Insufficiently Active   Days of Exercise per Week: 3 days   Minutes of Exercise per Session: 30 min  Stress: No Stress Concern Present   Feeling of Stress : Not at all  Social Connections: Moderately Integrated   Frequency of Communication with Friends and Family: Twice a week   Frequency of Social Gatherings with Friends and Family: Twice a week   Attends Religious Services: More than 4 times per year   Active Member of [employer] or Organizations: No   Attends Music therapist: Never   Marital  Status: Married    Tobacco Counseling Counseling given: Not Answered   Clinical Intake:  Pre-visit preparation completed: Yes  Pain : No/denies pain     Nutritional Risks: None Diabetes: No  How often do you need to have someone help you when you read instructions, pamphlets, or other written materials from your doctor or pharmacy?: 1 - Never What is the last grade level you completed in school?: BS  Diabetic?no  Interpreter Needed?: No  Information entered by :: [place] of Daily Living In your present state of health, do you have any difficulty performing the following activities: 12/28/2020  Hearing? N  Vision? N  Difficulty concentrating or making decisions? N  Walking or  climbing stairs? N  Dressing or bathing? N  Doing errands, shopping? N  Preparing Food and eating ? N  Using the Toilet? N  In the past six months, have you accidently leaked urine? N  Do you have problems with loss of bowel control? N  Managing your Medications? N  Managing your Finances? N  Housekeeping or managing your Housekeeping? N  Some recent data might be hidden    Patient Care Team: Laurey Morale, MD as PCP - General (Family Medicine) Minus Breeding, MD as PCP - Cardiology (Cardiology) Viona Gilmore, [facility] as Pharmacist (Pharmacist)  Indicate any recent Medical Services you may have received from other than [facility] providers in the past year (date may be approximate).     Assessment:   This is a routine wellness examination for Luke [name].  Hearing/Vision screen Vision Screening - Comments:: Annual eye exams wears glasses   Dietary issues and exercise activities discussed: Current Exercise Habits: Home exercise routine, Type of exercise: walking, Time (Minutes): 30, Frequency (Times/Week): 3, Weekly Exercise (Minutes/Week): 90, Intensity: Mild, Exercise limited by: cardiac condition(s)   Goals Addressed             This Visit's Progress    Exercise 150  min/wk Moderate Activity   On track      Depression Screen PHQ 2/9 Scores 12/28/2020 12/28/2020 12/23/2019 02/24/2018 02/21/2017 02/17/2016 08/02/2015  PHQ - 2 Score 0 0 0 0 0 0 0  PHQ- 9 Score - - 0 - - - -    Fall Risk Fall Risk  12/28/2020 12/23/2019 02/24/2018 02/21/2017 02/17/2016  Falls in the past year? 0 0 0 No No  Number falls in past yr: 0 0 - - -  Injury with Fall? 0 0 - - -  Comment - - - - -  Risk for fall due to : No Fall Risks - - - -  Follow up Falls evaluation completed Falls evaluation completed;Falls prevention discussed - - -    FALL RISK PREVENTION PERTAINING TO THE HOME:  Any stairs in or around the home? Yes  If so, are there any without handrails? No  Home free of loose throw rugs in walkways, pet beds, electrical cords, etc? Yes  Adequate lighting in your home to reduce risk of falls? Yes   ASSISTIVE DEVICES UTILIZED TO PREVENT FALLS:  Life alert? No  Use of a cane, walker or w/c? No  Grab bars in the bathroom? No  Shower chair or bench in shower? Yes  Elevated toilet seat or a handicapped toilet? Yes    Cognitive Function: Normal cognitive status assessed by direct observation by this Nurse Health Advisor. No abnormalities found.   MMSE - Mini Mental State Exam 02/17/2016  Not completed: (No Data)        Immunizations Immunization History  Administered Date(s) Administered   Fluad Quad(high Dose 65+) 11/11/2018, 11/28/2020   Influenza Split 12/18/2010, 01/04/2012   Influenza Whole 12/20/2009   Influenza, High Dose Seasonal PF 12/17/2012, 12/13/2015, 11/26/2016, 12/06/2017, 12/18/2019   Influenza,inj,Quad PF,6+ Mos 11/12/2013, 01/06/2015   PFIZER(Purple Top)SARS-COV-2 Vaccination 03/25/2019, 04/15/2019, 12/02/2019   Pneumococcal Conjugate-13 11/12/2013   Pneumococcal Polysaccharide-23 06/13/2011   Td 04/23/2003   Tdap 11/27/2016   Zoster, Live 01/16/2013    TDAP status: Up to date  Flu Vaccine status: Up to date  Pneumococcal  vaccine status: Up to date  Covid-19 vaccine status: Completed vaccines  Qualifies for Shingles Vaccine? Yes   Zostavax completed No   Shingrix Completed?:  No.    Education has been provided regarding the importance of this vaccine. Patient has been advised to call insurance company to determine out of pocket expense if they have not yet received this vaccine. Advised may also receive vaccine at local pharmacy or Health Dept. Verbalized acceptance and understanding.  Screening Tests Health Maintenance  Topic Date Due   Zoster Vaccines- Shingrix (1 of 2) Never done   COVID-19 Vaccine (4 - Booster for Pfizer series) 01/27/2020   COLONOSCOPY (Pts 45-29yrs Insurance coverage will need to be confirmed)  05/14/2024   TETANUS/TDAP  11/28/2026   Pneumonia Vaccine 69+ Years old  Completed   INFLUENZA VACCINE  Completed   Hepatitis C Screening  Completed   HPV VACCINES  Aged Out    Health Maintenance  Health Maintenance Due  Topic Date Due   Zoster Vaccines- Shingrix (1 of 2) Never done   COVID-19 Vaccine (4 - Booster for Pfizer series) 01/27/2020    Colorectal cancer screening: Type of screening: Colonoscopy. Completed 05/15/2019. Repeat every 5 years  Lung Cancer Screening: (Low Dose CT Chest recommended if Age 65-80 years, 30 pack-year currently smoking OR have quit w/in 15years.) does not qualify.   Lung Cancer Screening Referral: n/a  Additional Screening:  Hepatitis C Screening: does not qualify; Completed 02/17/2016  Vision Screening: Recommended annual ophthalmology exams for early detection of glaucoma and other disorders of the eye. Is the patient up to date with their annual eye exam?  Yes  Who is the provider or what is the name of the office in which the patient attends annual eye exams? Dr.hutton  If pt is not established with a provider, would they like to be referred to a provider to establish care? No .   Dental Screening: Recommended annual dental exams for proper  oral hygiene  Community Resource Referral / Chronic Care Management: CRR required this visit?  No   CCM required this visit?  No      Plan:     I have personally reviewed and noted the following in the patient's chart:   Medical and social history Use of alcohol, tobacco or illicit drugs  Current medications and supplements including opioid prescriptions. Patient is not currently taking opioid prescriptions. Functional ability and status Nutritional status Physical activity Advanced directives List of other physicians Hospitalizations, surgeries, and ER visits in previous 12 months Vitals Screenings to include cognitive, depression, and falls Referrals and appointments  In addition, I have reviewed and discussed with patient certain preventive protocols, quality metrics, and best practice recommendations. A written personalized care plan for preventive services as well as general preventive health recommendations were provided to patient.     Randel Pigg, LPN   96/[id number]   Nurse Notes: none

## 2021-01-11 DIAGNOSIS — Z23 Encounter for immunization: Secondary | ICD-10-CM | POA: Diagnosis not present

## 2021-01-11 NOTE — Progress Notes (Signed)
Cardiology Office Note:    Date:  01/11/2021   ID:  JOSIYAH TOZZI, DOB [birth year], MRN [id number]  PCP:  Laurey Morale, MD   [facility] Providers Cardiologist:  Minus Breeding, MD      Referring MD: Laurey Morale, MD   Follow-up for paroxysmal atrial fibrillation  History of Present Illness:    Luke [name] is a 75 y.o. male with a hx of asthma, depression, ADD, HLD, HTN, paroxysmal atrial fibrillation, and CVA.   He was seen by Dr. Percival Spanish on 08/24/2020.  He was previously found to have atrial fibrillation in the setting of URI.  He underwent ischemic work-up which was negative.  His echocardiogram was unremarkable.  He did not want to continue DOAC.  He had been admitted to the hospital with CVA.  It appeared to be embolic in nature.  He was subsequently placed on Eliquis.  He presented to the emergency department 11/21 and was found to be in atrial fibrillation.  He reportedly was in atrial fibrillation for about 10 hours.  His EKG confirmed his atrial fibrillation.  He converted to sinus rhythm while waiting in the waiting room.  He left without being seen.   On follow-up with Dr. Percival Spanish on 08/24/2020 he reported another episode of around 10 hours of atrial fibrillation.  The episode was in January 2022.  He did take Cardizem which provided relief.  His heart rate has been around 110 bpm.  He denied chest pain presyncope and syncope with the episode.  He was not active but denied other cardiac symptoms.  He denied arm neck back and chest discomfort.  He denied shortness of breath PND and orthopnea.  He denied palpitations.   He presented to the emergency department 10/11/2020.  He reported that he had gone into atrial fibrillation around 3:30 AM.  He reported irregular heartbeat.  His irregular heartbeat was making it hard for him to sleep.  He reported an episode of dizziness that lasted 2 to 3 seconds.  During the episode he felt as if his heart rate had increased.   During the time of his exam he still reported an unusual feeling heartbeat.  He denied dizziness, shortness of breath, chest pain.  He denied chest discomfort throughout his course of irregular heartbeat.  He reported compliance with his Eliquis and no missed doses.  DCCV was discussed.  His CBC, BMP, troponin, and chest x-ray were normal.  He spontaneously converted to sinus rhythm at 4:15 PM.  He was able to ambulate without symptoms.  He was discharged in stable condition.   He presented to the clinic 12/07/2020 for follow-up evaluation stated he had noticed some increased fatigue while on carvedilol.  He visited his PCP who recommended that he continue his amlodipine and carvedilol until being seen by cardiology to make adjustments.  He did reduce his carvedilol to 3.125 mg twice daily.  He continued his amlodipine at 10 mg daily due to his blood pressure increasing into the 140s.  He had not been on any other type of beta-blocker previously.  He denied recurrence of accelerated or irregular heartbeat.  He was cardiac aware.  He did notice his heartbeat at night when he laid down to go to sleep.  We discussed this and I reassured that it was okay.  I asked him to avoid triggers for atrial fibrillation and we briefly discussed obstructive sleep apnea.  He did not believe he snored and or suffered from sleep apnea.  I  stopped his carvedilol and start metoprolol succinate 12.5 mg daily.  Follow-up was planned for 1 month.  He presents the clinic today for follow-up evaluation and states he has noticed that his blood pressure and heart rate are better.  He feels that his fatigue is just slightly better.  However, he continues to have hard time with his daily activities.  He presented to his PCP who recommended increase physical activity.  He reports compliance with his apixaban and denies bleeding issues.  We will stop his metoprolol succinate and start him on metoprolol tartrate 12.5 twice daily to see if he  has any improvement in his fatigue.  We will continue his current diet and have him increase his physical activity as tolerated.  We will plan for follow-up in 3 months.   Today he denies chest pain,  lower extremity edema,  palpitations, melena, hematuria, hemoptysis, diaphoresis, weakness, presyncope, syncope, orthopnea, and PND.  Past Medical History:  Diagnosis Date   Asthma    Depression    ED (erectile dysfunction)    Hyperlipidemia    Hypertension    Paroxysmal atrial fibrillation ([place]) 09/2016   Initial diagnosis was in setting of acute URI/asthma attack   Solitary pulmonary nodule 04/16/2013   03/30/13  CXR Question left nipple shadow.  10 mm ovoid nodular density right upper lobe, cannot exclude  pulmonary mass/ nodule > f/u cxr 04/29/2013 > no nodule     Stroke [facility])    Ulcer     Past Surgical History:  Procedure Laterality Date   COLONOSCOPY  05/15/2019   per Dr. Wilford Corner, clear, no repeats needed    NM MYOVIEW LTD  10/2016   ailable, images/films reviewed: From Epic Chart or Care Everywhere)   TRANSTHORACIC ECHOCARDIOGRAM  25/9563   Normal systolic function. EF 5560%. No RWMA. Gr 1 DD. Mild AoV calcification - no stenosis. Atrial Septum - lipomatous hypertrophy. --Essentially normal.    Current Medications: No outpatient medications have been marked as taking for the 01/13/21 encounter (Appointment) with Deberah Pelton, NP.     Allergies:   Aspirin, Hydrochlorothiazide, Irbesartan, and Symbicort [budesonide-formoterol fumarate]   Social History   Socioeconomic History   Marital status: Married    Spouse name: Not on file   Number of children: Not on file   Years of education: Not on file   Highest education level: Not on file  Occupational History   Occupation: Retired Product manager: RETIRED  Tobacco Use   Smoking status: Never   Smokeless tobacco: Never  Vaping Use   Vaping Use: Never used  Substance and Sexual Activity   Alcohol use: Yes     Alcohol/week: 2.0 standard drinks    Types: 2 Standard drinks or equivalent per week    Comment: socially occasionally    Drug use: No   Sexual activity: Not on file  Other Topics Concern   Not on file  Social History Narrative   Not on file   Social Determinants of Health   Financial Resource Strain: Low Risk    Difficulty of Paying Living Expenses: Not hard at all  Food Insecurity: No Food Insecurity   Worried About Charity fundraiser in the Last Year: Never true   [place] in the Last Year: Never true  Transportation Needs: No Transportation Needs   Lack of Transportation (Medical): No   Lack of Transportation (Non-Medical): No  Physical Activity: Insufficiently Active   Days of  Exercise per Week: 3 days   Minutes of Exercise per Session: 30 min  Stress: No Stress Concern Present   Feeling of Stress : Not at all  Social Connections: Moderately Integrated   Frequency of Communication with Friends and Family: Twice a week   Frequency of Social Gatherings with Friends and Family: Twice a week   Attends Religious Services: More than 4 times per year   Active Member of [employer] or Organizations: No   Attends Music therapist: Never   Marital Status: Married     Family History: The patient's family history includes Cancer in his brother; Coronary artery disease in his brother and father; Emphysema in his brother; Hyperlipidemia in his father; Hypertension in his brother and mother; Lung cancer in his brother; Peripheral vascular disease in his father and mother; Stroke in his mother.  ROS:   Please see the history of present illness.     All other systems reviewed and are negative.   Risk Assessment/Calculations:    CHA2DS2-VASc Score = 4    This indicates a 4.8% annual risk of stroke. The patient's score is based upon: CHF History: 0 HTN History: 1 Diabetes History: 0 Stroke History: 2 Vascular Disease History: 0 Age Score: 1 Gender Score: 0           Physical Exam:    VS:  There were no vitals taken for this visit.    Wt Readings from Last 3 Encounters:  12/07/20 177 lb 9.6 oz (80.6 kg)  11/04/20 177 lb (80.3 kg)  10/24/20 182 lb 6 oz (82.7 kg)     GEN:  Well nourished, well developed in no acute distress HEENT: Normal NECK: No JVD; No carotid bruits LYMPHATICS: No lymphadenopathy CARDIAC: RRR, no murmurs, rubs, gallops RESPIRATORY:  Clear to auscultation without rales, wheezing or rhonchi  ABDOMEN: Soft, non-tender, non-distended MUSCULOSKELETAL:  No edema; No deformity  SKIN: Warm and dry NEUROLOGIC:  Alert and oriented x 3 PSYCHIATRIC:  Normal affect    EKGs/Labs/Other Studies Reviewed:    The following studies were reviewed today:  Echocardiogram 04/20/2018 IMPRESSIONS     1. The left ventricle has normal systolic function with an ejection  fraction of 60-65%. The cavity size was normal. Left ventricular diastolic  Doppler parameters are consistent with impaired relaxation No evidence of  left ventricular regional wall  motion abnormalities.   2. The right ventricle has normal systolic function. The cavity was  normal. There is no increase in right ventricular wall thickness.   3. The mitral valve is normal in structure.   4. The tricuspid valve is normal in structure.   5. The aortic valve is tricuspid Moderate thickening of the aortic valve  Moderate calcification of the aortic valve.   6. The pulmonic valve was normal in structure.   7. No evidence of left ventricular regional wall motion abnormalities.   8. Right atrial pressure is estimated at 3 mmHg.   EKG:  EKG is  ordered today.  The ekg ordered today demonstrates normal sinus rhythm right bundle branch block 60 bpm  Recent Labs: 03/15/2020: ALT 14; TSH 1.80 10/11/2020: BUN 16; Creatinine, Ser 1.01; Hemoglobin 14.0; Platelets 339; Potassium 4.1; Sodium 140  Recent Lipid Panel    Component Value Date/Time   CHOL 139 03/15/2020 1122   TRIG  176.0 (H) 03/15/2020 1122   HDL 35.40 (L) 03/15/2020 1122   CHOLHDL 4 03/15/2020 1122   VLDL 35.2 03/15/2020 1122   LDLCALC 69 03/15/2020 1122  LDLDIRECT 73.0 02/08/2015 0906    ASSESSMENT & PLAN    Paroxysmal atrial fibrillation-EKG today shows normal sinus rhythm right bundle branch block 60 bpm.  Presented to the emergency department on 10/11/2020.  Patient had not been able to sleep due to irregular heartbeat.  He took 2 doses his Cardizem medication which did not convert him back to sinus rhythm.  He reported to the emergency room.  Plans were discussed for DCCV.  He spontaneously converted and was discharged in stable condition.  He is compliant with apixaban and denies bleeding issues.  Continues to note fatigue with metoprolol succinate.  We will transition to metoprolol tartrate Continue  Cardizem, apixaban,  stop metoprolol succinate 12.5 mg daily Start metoprolol tartrate 12.5 mg twice daily Heart healthy low-sodium diet-salty 6 given Increase physical activity as tolerated Avoid triggers caffeine, chocolate, EtOH, dehydration etc.   Essential hypertension-BP today 122/80.  Well-controlled at home. Continue metoprolol, amlodipine, losartan Heart healthy low-sodium diet-salty 6 given Increase physical activity as tolerated Continue to maintain blood pressure log   Hyperlipidemia-03/15/2020: Cholesterol 139; HDL 35.40; LDL Cholesterol 69; Triglycerides 176.0; VLDL 35.2 Continue atorvastatin Heart healthy low-sodium high-fiber diet Increase physical activity as tolerated Plan to repeat fasting lipids and LFTs 1/23   Carotid artery stenosis-carotid Doppler showed 60-79% right carotid stenosis and 40-59% left stenosis [facility]     Disposition: Follow-up with Dr. Percival Spanish or me in 3 months.       Medication Adjustments/Labs and Tests Ordered: Current medicines are reviewed at length with the patient today.  Concerns regarding medicines are outlined above.  No  orders of the defined types were placed in this encounter.  No orders of the defined types were placed in this encounter.   There are no Patient Instructions on file for this visit.   Signed, Deberah Pelton, NP  01/11/2021 7:04 AM      Notice: This dictation was prepared with Dragon dictation along with smaller phrase technology. Any transcriptional errors that result from this process are unintentional and may not be corrected upon review.  I spent 14 minutes examining this patient, reviewing medications, and using patient centered shared decision making involving her cardiac care.  Prior to her visit I spent greater than 20 minutes reviewing her past medical history,  medications, and prior cardiac tests.

## 2021-01-13 ENCOUNTER — Other Ambulatory Visit (HOSPITAL_BASED_OUTPATIENT_CLINIC_OR_DEPARTMENT_OTHER): Payer: Self-pay | Admitting: General Practice

## 2021-01-13 ENCOUNTER — Other Ambulatory Visit: Payer: Self-pay

## 2021-01-13 ENCOUNTER — Encounter (HOSPITAL_BASED_OUTPATIENT_CLINIC_OR_DEPARTMENT_OTHER): Payer: Self-pay | Admitting: General Practice

## 2021-01-13 ENCOUNTER — Ambulatory Visit (INDEPENDENT_AMBULATORY_CARE_PROVIDER_SITE_OTHER): Payer: Medicare Other | Admitting: General Practice

## 2021-01-13 VITALS — BP 122/80 | HR 60 | Ht 67.0 in | Wt 177.4 lb

## 2021-01-13 DIAGNOSIS — I10 Essential (primary) hypertension: Secondary | ICD-10-CM

## 2021-01-13 DIAGNOSIS — I6523 Occlusion and stenosis of bilateral carotid arteries: Secondary | ICD-10-CM

## 2021-01-13 DIAGNOSIS — E785 Hyperlipidemia, unspecified: Secondary | ICD-10-CM

## 2021-01-13 DIAGNOSIS — I480 Paroxysmal atrial fibrillation: Secondary | ICD-10-CM

## 2021-01-13 MED ORDER — METOPROLOL TARTRATE 25 MG PO TABS: 12.5000 mg | ORAL_TABLET | Freq: Two times a day (BID) | ORAL | 3 refills | Status: DC

## 2021-01-13 NOTE — Patient Instructions (Signed)
Medication Instructions:  Your physician has recommended you make the following change in your medication:  Stop: Metoprolol Succinate  Start: Metoprolol Tartrate 12.5mg  twice daily (1/2 tablet)   *If you need a refill on your cardiac medications before your next appointment, please call your pharmacy*   Lab Work: None today     Testing/Procedures: None today    Follow-Up: At [facility], you and your health needs are our priority.  As part of our continuing mission to provide you with exceptional heart care, we have created designated Provider Care Teams.  These Care Teams include your primary Cardiologist (physician) and Advanced Practice Providers (APPs -  Physician Assistants and Nurse Practitioners) who all work together to provide you with the care you need, when you need it.  We recommend signing up for the patient portal called "MyChart".  Sign up information is provided on this After Visit Summary.  MyChart is used to connect with patients for Virtual Visits (Telemedicine).  Patients are able to view lab/test results, encounter notes, upcoming appointments, etc.  Non-urgent messages can be sent to your provider as well.   To learn more about what you can do with MyChart, go to [email or web address].    Your next appointment:   3 month(s)  The format for your next appointment:   In Person  Provider:   Minus Breeding, MD  or Coletta Memos, FNP      Other Instructions Continue to increase your physical activity as tolerated

## 2021-01-29 NOTE — Progress Notes (Signed)
Cardiology Office Note    Date:  01/31/2021   ID:  Dmitriy, Gair 1945-11-19, MRN [id number]  PCP:  Laurey Morale, MD  Cardiologist:   Minus Breeding, MD   Chief Complaint  Patient presents with   Palpitations        History of Present Illness: Luke [name] is a 75 y.o. male who presents for follow up of atrial fib.    He was found to have atrial fib in the context of a URI.  He had a negative ischemia work up and unremarkable echo  He did not want to continue with the [place].   He was in the hospital with a CVA.   This appeared to be embolic. He was subsequently put on Eliquis.  Since I last saw him he was in the emergency room in August with atrial fibrillation.  He left without being seen.   While he was waiting in the emergency room he went back into sinus rhythm.  He said that he had another episode of atrial fibrillation in October but it went away before he would have presented to the emergency room.  He has some other palpitations that he feels he needs seem to be associated with episodes of fleeting dizziness.  They might last less than 5 minutes.  He does not think this is his fibrillation.  He overall says he just does not feel as well as he thinks he should.  He has some episodes where he wonders if he is heading into a panic attack.  He feels a little lightheaded.  He feels somewhat anxious.  He wonders if he could have his medications reduced.  Of note at the last visit in our office after his August ER visit he did have his carvedilol switched to metoprolol as his heart rate was running low.  He is not having any chest pressure, neck or arm discomfort.   Past Medical History:  Diagnosis Date   Asthma    Depression    ED (erectile dysfunction)    Hyperlipidemia    Hypertension    Paroxysmal atrial fibrillation ([place]) 09/2016   Initial diagnosis was in setting of acute URI/asthma attack   Solitary pulmonary nodule 04/16/2013   03/30/13  CXR Question  left nipple shadow.  10 mm ovoid nodular density right upper lobe, cannot exclude  pulmonary mass/ nodule > f/u cxr 04/29/2013 > no nodule     Stroke [facility])    Ulcer     Past Surgical History:  Procedure Laterality Date   COLONOSCOPY  05/15/2019   per Dr. Wilford Corner, clear, no repeats needed    NM MYOVIEW LTD  10/2016   ailable, images/films reviewed: From Epic Chart or Care Everywhere)   TRANSTHORACIC ECHOCARDIOGRAM  78/2956   Normal systolic function. EF 5560%. No RWMA. Gr 1 DD. Mild AoV calcification - no stenosis. Atrial Septum - lipomatous hypertrophy. --Essentially normal.     Current Outpatient Medications  Medication Sig Dispense Refill   albuterol (VENTOLIN HFA) 108 (90 Base) MCG/ACT inhaler 1 puff     amLODipine (NORVASC) 10 MG tablet Take 10 mg by mouth daily.     apixaban (ELIQUIS) 5 MG TABS tablet Take 1 tablet (5 mg total) by mouth 2 (two) times daily for 30 days. 60 tablet 0   atorvastatin (LIPITOR) 10 MG tablet TAKE 1 TABLET AT BEDTIME 90 tablet 0   diltiazem (CARDIZEM) 30 MG tablet Take one tablet as needed for heart  rates greater than 100 30 tablet 0   famotidine (PEPCID) 20 MG tablet Take 1 tablet (20 mg total) by mouth as needed for heartburn or indigestion. 1 tablet 0   metoprolol tartrate (LOPRESSOR) 25 MG tablet Take 0.5 tablets (12.5 mg total) by mouth 2 (two) times daily. 90 tablet 2   sildenafil (VIAGRA) 50 MG tablet Take 1 tablet (50 mg total) by mouth as needed. 30 tablet 3   losartan (COZAAR) 50 MG tablet Take 1 tablet (50 mg total) by mouth daily. 90 tablet 3   No current facility-administered medications for this visit.    Allergies:   Aspirin, Hydrochlorothiazide, Irbesartan, and Symbicort [budesonide-formoterol fumarate]    ROS:  Please see the history of present illness.   Otherwise, review of systems are positive for none.   All other systems are reviewed and negative.    PHYSICAL EXAM: VS:  BP 119/72   Pulse (!) 58   Ht 5\' 7"  (1.702 m)    Wt 179 lb (81.2 kg)   SpO2 97%   BMI 28.04 kg/m  , BMI Body mass index is 28.04 kg/m. GENERAL:  Well appearing NECK:  No jugular venous distention, waveform within normal limits, carotid upstroke brisk and symmetric, no bruits, no thyromegaly LUNGS:  Clear to auscultation bilaterally CHEST:  Unremarkable HEART:  PMI not displaced or sustained,S1 and S2 within normal limits, no S3, no S4, no clicks, no rubs, no murmurs ABD:  Flat, positive bowel sounds normal in frequency in pitch, no bruits, no rebound, no guarding, no midline pulsatile mass, no hepatomegaly, no splenomegaly EXT:  2 plus pulses throughout, no edema, no cyanosis no clubbing   EKG:  EKG is not ordered today.    Recent Labs: 03/15/2020: ALT 14; TSH 1.80 10/11/2020: BUN 16; Creatinine, Ser 1.01; Hemoglobin 14.0; Platelets 339; Potassium 4.1; Sodium 140    Lipid Panel    Component Value Date/Time   CHOL 139 03/15/2020 1122   TRIG 176.0 (H) 03/15/2020 1122   HDL 35.40 (L) 03/15/2020 1122   CHOLHDL 4 03/15/2020 1122   VLDL 35.2 03/15/2020 1122   LDLCALC 69 03/15/2020 1122   LDLDIRECT 73.0 02/08/2015 0906      Wt Readings from Last 3 Encounters:  01/30/21 179 lb (81.2 kg)  01/13/21 177 lb 6.4 oz (80.5 kg)  12/07/20 177 lb 9.6 oz (80.6 kg)      Other studies Reviewed: Additional studies/ records that were reviewed today include: ED records Review of the above records demonstrates: See elsewhere  ASSESSMENT AND PLAN:  ATRIAL FIB:  Luke [name] has a CHA2DS2 - VASc score of  4.   We had a long discussion about this.  He does not want to consider ablation or antiarrhythmics at this point.  He will continue with the low-dose beta-blocker.  Other meds will be adjusted as below.  In order to understand some of the other symptoms he is having that he does not think he has his fibrillation I would like for him to get a Kardia  HTN: The blood pressure is running low and he would like to reduce his meds so we  will reduce his Cozaar to 50 mg daily.   CAROTID DOPPLER: 60 to 79% right carotid stenosis (60% on angiogram) and 40 to 59% left stenosis.  This is followed by the [place].      DYSLIPDEMIA:   LDL was 69 with an HDL of 35.  No change in therapy.    Current medicines  are reviewed at length with the patient today.  The patient does not have concerns regarding medicines.  The following changes have been made:   As above  Labs/ tests ordered today include: None  No orders of the defined types were placed in this encounter.    Disposition:   FU with APP in six months.   Signed, Minus Breeding, MD  01/31/2021 5:34 PM    [facility]

## 2021-01-30 ENCOUNTER — Ambulatory Visit (INDEPENDENT_AMBULATORY_CARE_PROVIDER_SITE_OTHER): Payer: Medicare Other | Admitting: Cardiology

## 2021-01-30 ENCOUNTER — Encounter: Payer: Self-pay | Admitting: Cardiology

## 2021-01-30 ENCOUNTER — Other Ambulatory Visit: Payer: Self-pay

## 2021-01-30 VITALS — BP 119/72 | HR 58 | Ht 67.0 in | Wt 179.0 lb

## 2021-01-30 DIAGNOSIS — I480 Paroxysmal atrial fibrillation: Secondary | ICD-10-CM | POA: Diagnosis not present

## 2021-01-30 DIAGNOSIS — E785 Hyperlipidemia, unspecified: Secondary | ICD-10-CM | POA: Diagnosis not present

## 2021-01-30 DIAGNOSIS — I6523 Occlusion and stenosis of bilateral carotid arteries: Secondary | ICD-10-CM | POA: Diagnosis not present

## 2021-01-30 DIAGNOSIS — I10 Essential (primary) hypertension: Secondary | ICD-10-CM

## 2021-01-30 MED ORDER — LOSARTAN POTASSIUM 50 MG PO TABS: 50.0000 mg | ORAL_TABLET | Freq: Every day | ORAL | 3 refills | Status: DC

## 2021-01-30 NOTE — Patient Instructions (Addendum)
Medication Instructions:  Decrease losartan (Cozaar) to 50 mg each day.  *If you need a refill on your cardiac medications before your next appointment, please call your pharmacy*   Lab Work: None   Testing/Procedures: None   Follow-Up: At [facility], you and your health needs are our priority.  As part of our continuing mission to provide you with exceptional heart care, we have created designated Provider Care Teams.  These Care Teams include your primary Cardiologist (physician) and Advanced Practice Providers (APPs -  Physician Assistants and Nurse Practitioners) who all work together to provide you with the care you need, when you need it.  We recommend signing up for the patient portal called "MyChart".  Sign up information is provided on this After Visit Summary.  MyChart is used to connect with patients for Virtual Visits (Telemedicine).  Patients are able to view lab/test results, encounter notes, upcoming appointments, etc.  Non-urgent messages can be sent to your provider as well.   To learn more about what you can do with MyChart, go to [email or web address].    Your next appointment:   Keep your August 19, 2021 appointment  The format for your next appointment:   In Person  Provider:   Dr. Vita Barley, MD   Other Instructions Purchase Kardia by [employer] to monitor heart rhythm.

## 2021-01-31 ENCOUNTER — Encounter: Payer: Self-pay | Admitting: Cardiology

## 2021-03-02 ENCOUNTER — Telehealth: Payer: Self-pay | Admitting: Pharmacist

## 2021-03-02 NOTE — Chronic Care Management (AMB) (Addendum)
° ° °  Chronic Care Management Pharmacy Assistant   Name: Luke [name]  MRN: [id number] DOB: Sep 20, 1945  03/07/2021 APPOINTMENT REMINDER   Luke [name] was reminded to have all medications, supplements and any blood glucose and blood pressure readings available for review with [name], Pharm. D, at his telephone visit on 03/07/2021 at 11:00.   Questions: Have you had any recent office visit or specialist visit outside of [place]? Patient denies any visits outside of [facility].  Are there any concerns you would like to discuss during your office visit? Patient denies any concerns at this time.  Are you having any problems obtaining your medications? (Whether it pharmacy issues or cost) Patient denies any issues obtaining medications.  If patient has any PAP medications ask if they are having any problems getting their PAP medication or refill? Patient denies any medications filled by PAP.  Care Gaps: AWV - completed 12/28/2020 Last BP - 119/72 on 01/30/2021 Last A1C - 6.2 on 03/15/2020 Shingrix - overdue  Star Rating Drug: Atorvastatin 10 mg last filled 12/15/2020 90 DS at [facility] Losartan 50 mg last filled 01/30/2021 90 DS at [facility]  Any gaps in medications fill history?  No  Gennie Alma [facility]  Catering manager [phone]

## 2021-03-07 ENCOUNTER — Ambulatory Visit (INDEPENDENT_AMBULATORY_CARE_PROVIDER_SITE_OTHER): Payer: Medicare Other | Admitting: Pharmacist

## 2021-03-07 DIAGNOSIS — I10 Essential (primary) hypertension: Secondary | ICD-10-CM

## 2021-03-07 DIAGNOSIS — I4891 Unspecified atrial fibrillation: Secondary | ICD-10-CM

## 2021-03-07 NOTE — Patient Instructions (Signed)
Hi Jaymason,  It was great to catch up with you again!  Please reach out to me if you have any questions or need anything before our follow up!  Best, Maddie  [name], PharmD, [facility]   Visit Information   Goals Addressed   None    Patient Care Plan: [facility] Pharmacy Care Plan     Problem Identified: Problem: Hypertension, Hyperlipidemia, Atrial Fibrillation, GERD, BPH, Allergic Rhinitis, and SOB, ED, muscle spasms, HSV prophylaxis      Long-Range Goal: Patient-Specific Goal   Start Date: 09/06/2020  Expected End Date: 09/06/2021  Recent Progress: On track  Priority: High  Note:   Current Barriers:  Unable to independently monitor therapeutic efficacy  Pharmacist Clinical Goal(s):  Patient will achieve adherence to monitoring guidelines and medication adherence to achieve therapeutic efficacy through collaboration with PharmD and provider.   Interventions: 1:1 collaboration with Laurey Morale, MD regarding development and update of comprehensive plan of care as evidenced by provider attestation and co-signature Inter-disciplinary care team collaboration (see longitudinal plan of care) Comprehensive medication review performed; medication list updated in electronic medical record  Hypertension (BP goal <130/80) -Not ideally controlled -Current treatment: Amlodipine 10 mg 1 tablet daily - in AM - appropriate, effective, safe, accessible Losartan 50 mg 1 tablet daily - in AM  - appropriate, effective, safe, accessible Metoprolol tartrate 25 mg 1/2 tablet twice daily - appropriate, effective, safe, accessible -Medications previously tried: none  -Current home readings: does not check routinely at home -Current dietary habits: did not discuss -Current exercise habits: did not discuss -Denies hypotensive/hypertensive symptoms -Educated on BP goals and benefits of medications for prevention of heart attack,  stroke and kidney damage; Importance of home blood pressure monitoring; Proper BP monitoring technique; Symptoms of hypotension and importance of maintaining adequate hydration; -Counseled to monitor BP at home weekly, document, and provide log at future appointments -Counseled on diet and exercise extensively Recommended to continue current medication Recommended moving amlodipine to evening time to even out BP medication coverage  Hyperlipidemia: (LDL goal < 70) -Controlled -Current treatment: Atorvastatin 10 mg 1 tablet at bedtime - appropriate, effective, safe, accessible -Medications previously tried: simvastatin  -Current dietary patterns: did not discuss -Current exercise habits: did not discuss -Educated on Cholesterol goals;  Benefits of statin for ASCVD risk reduction; Importance of limiting foods high in cholesterol; -Counseled on diet and exercise extensively Recommended to continue current medication Consider increasing atorvastatin for further TG lowering  Pre-diabetes (A1c goal <6.5%) -Controlled -Current medications: No medications -Medications previously tried: none  -Current home glucose readings fasting glucose: does not need to check post prandial glucose: does not need to check -Denies hypoglycemic/hyperglycemic symptoms -Current meal patterns:  breakfast: did not discuss  lunch: did not discuss   dinner: did not discuss  snacks: did not discuss  drinks: did not discuss  -Current exercise: not routinely -Educated on A1c and blood sugar goals; Counseled to check feet daily and get yearly eye exams -Counseled to check feet daily and get yearly eye exams -Counseled on diet and exercise extensively   Atrial Fibrillation (Goal: prevent stroke and major bleeding) -Controlled -CHADSVASC: 4 -Current treatment: Rate control: metoprolol tartrate 25 mg 1/2 tablet twice daily; diltiazem 30 mg 1 tablet as needed for HR > 100 - appropriate, effective, safe,  accessible Anticoagulation: Eliquis 5 mg 1 tablet twice daily - appropriate, effective, safe, accessible -Medications previously tried: metoprolol (unknown) -Home BP and HR readings: 55-68; 128/70,  139/77, 125/71, 132/72, 120/66, 119/71 -Counseled on avoidance of NSAIDs due to increased bleeding risk with anticoagulants; -Recommended to continue current medication  GERD (Goal: minimize symptoms) -Controlled -Current treatment  Famotidine 20 mg 1 tablet as needed -Medications previously tried: none  -Recommended to continue current medication  Shortness of breath (Goal: improve breathing) -Controlled -Current treatment  Albuterol HFA inhale 2 puffs every 6 hours as needed (not used in several years) - appropriate, effective, safe, accessible -Medications previously tried: none  -Recommended to continue current medication  ED (Goal: minimize symptoms) -Controlled -Current treatment  Sildenafil 50 mg 1 tablet as needed - appropriate, effective, safe, accessible -Medications previously tried: none  -Recommended to continue current medication  Muscle spasms (Goal: minimize symptoms) -Controlled -Current treatment  Metaxalone 800 mg 1 tablet twice daily as needed -Medications previously tried: none  -Recommended to continue current medication  HSV prophylaxis (Goal: prevent HSV infections) -Controlled -Current treatment  Acyclovir 200 mg 1 capsule every other day -Medications previously tried: none  -Recommended to continue current medication  Insomnia (Goal: improve quality and quantity of sleep) -Not ideally controlled -Current treatment  Tylenol PM at bedtime as needed - query appropriate -Medications previously tried: melatonin (ineffective)  -Recommended discussion with PCP for safer alternative.   Health Maintenance -Vaccine gaps: shingrix, COVID booster -Current therapy:  None -Educated on Cost vs benefit of each product must be carefully weighed by individual  consumer -Patient is satisfied with current therapy and denies issues -Recommended to continue as is  Patient Goals/Self-Care Activities Patient will:  - take medications as prescribed check blood pressure weekly, document, and provide at future appointments target a minimum of 150 minutes of moderate intensity exercise weekly  Follow Up Plan: Telephone follow up appointment with care management team member scheduled for:6 months       Patient verbalizes understanding of instructions provided today and agrees to view in [place].  Telephone follow up appointment with pharmacy team member scheduled for: 6 months  Viona Gilmore, [facility]

## 2021-03-07 NOTE — Progress Notes (Signed)
Chronic Care Management Pharmacy Note  03/07/2021 Name:  Luke [name] MRN:  [id number] DOB:  08/29/1945  Summary: BP mostly at goal < 130/80 per home readings  Recommendations/Changes made from today's visit: -Recommended routine monitoring of BP at home -Recommended discussing sleep medications with PCP next week -Consider sleep study for insomnia  Plan: -Follow up in 3 months for BP assessment   Subjective: Luke [name] is an 76 y.o. year old male who is a primary patient of Laurey Morale, MD.  The CCM team was consulted for assistance with disease management and care coordination needs.    Engaged with patient face to face for follow up visit in response to provider referral for pharmacy case management and/or care coordination services.   Consent to Services:  The patient was given information about Chronic Care Management services, agreed to services, and gave verbal consent prior to initiation of services.  Please see initial visit note for detailed documentation.   Patient Care Team: Laurey Morale, MD as PCP - General (Family Medicine) Minus Breeding, MD as PCP - Cardiology (Cardiology) Viona Gilmore, [facility] as Pharmacist (Pharmacist)  Recent office visits: 12/28/20 Randel Pigg, LPN: Patient presented for AWV.  11/04/20 Laurey Morale, MD - Patient presented for Bradycardia and other concerns. Changed Carvedilol to 3.125 mg oral 2 times with a meal daily   10/24/20 Laurey Morale, MD - Patient presented for A-fib and other concerns. Changed amlodipine to 5 mg daily. Stopped Acyclovir 200 mg.  Recent consult visits: 01/30/21 Minus Breeding, MD (cardiology): Patient presented for Afib follow up with palpitations. Decreased losartan to 50 mg daily.  01/24/21 Fortunato Curling ([place] PCP): Patient presented for transfer of care to this provider. Discussed reducing losartan dose but deferred to cardiology.  01/13/21 Coletta Memos, NP (cardiology): Patient presented  for Afib follow up. D'c/d metoprolol succinate and switched to metoprolol tartrate 12.5 mg daily.   12/07/20 Coletta Memos, NP (cardiology): Patient presented for Afib follow up. Stopped carvedilol and switched to metoprolol succinate 25 mg 1/2 tablet daily.  Hospital visits: Medication Reconciliation was completed by comparing discharge summary, patients EMR and Pharmacy list, and upon discussion with patient.   Patient presented to [place] ED on 10-11-2020 for Dizziness Patient was presnet for 2 hours.    New?Medications Started at [facility] Discharge:?? -started  None   Medication Changes at Hospital Discharge: -Changed  None Medications Discontinued at Hospital Discharge: -Stopped  None   Medications that remain the same after Hospital Discharge:??  -All other medications will remain the same.     Objective:  Lab Results  Component Value Date   CREATININE 1.01 10/11/2020   BUN 16 10/11/2020   GFR 75.31 03/15/2020   GFRNONAA >60 10/11/2020   GFRAA >60 04/19/2018   NA 140 10/11/2020   K 4.1 10/11/2020   CALCIUM 8.6 (L) 10/11/2020   CO2 25 10/11/2020   GLUCOSE 125 (H) 10/11/2020    Lab Results  Component Value Date/Time   HGBA1C 6.2 03/15/2020 11:22 AM   HGBA1C 6.1 03/12/2019 09:14 AM   GFR 75.31 03/15/2020 11:22 AM   GFR 68.48 03/12/2019 09:14 AM   MICROALBUR 1.3 10/27/2013 09:27 AM    Last diabetic Eye exam: No results found for: HMDIABEYEEXA  Last diabetic Foot exam: No results found for: HMDIABFOOTEX   Lab Results  Component Value Date   CHOL 139 03/15/2020   HDL 35.40 (L) 03/15/2020   LDLCALC 69 03/15/2020   LDLDIRECT  73.0 02/08/2015   TRIG 176.0 (H) 03/15/2020   CHOLHDL 4 03/15/2020    Hepatic Function Latest Ref Rng & Units 03/15/2020 03/12/2019 04/19/2018  Total Protein 6.0 - 8.3 g/dL 6.6 6.4 6.8  Albumin 3.5 - 5.2 g/dL 4.0 3.8 3.5  AST 0 - 37 U/L '12 10 15  ' ALT 0 - 53 U/L '14 11 14  ' Alk Phosphatase 39 - 117 U/L 90 91 82  Total  Bilirubin 0.2 - 1.2 mg/dL 0.4 0.4 0.5  Bilirubin, Direct 0.0 - 0.3 mg/dL 0.1 0.1 -    Lab Results  Component Value Date/Time   TSH 1.80 03/15/2020 11:22 AM   TSH 1.79 03/12/2019 09:14 AM   FREET4 0.90 03/15/2020 11:22 AM    CBC Latest Ref Rng & Units 10/11/2020 03/15/2020 01/25/2020  WBC 4.0 - 10.5 K/uL 10.3 10.7(H) 20.7(H)  Hemoglobin 13.0 - 17.0 g/dL 14.0 13.9 14.4  Hematocrit 39.0 - 52.0 % 41.9 41.5 44.4  Platelets 150 - 400 K/uL 339 310.0 335    No results found for: VD25OH  Clinical ASCVD: Yes  The ASCVD Risk score (Arnett DK, et al., 2019) failed to calculate for the following reasons:   The patient has a prior MI or stroke diagnosis    Depression screen [facility] 2/9 12/28/2020 12/28/2020 12/23/2019  Decreased Interest 0 0 0  Down, Depressed, Hopeless 0 0 0  PHQ - 2 Score 0 0 0  Altered sleeping - - 0  Tired, decreased energy - - 0  Change in appetite - - 0  Feeling bad or failure about yourself  - - 0  Trouble concentrating - - 0  Moving slowly or fidgety/restless - - 0  Suicidal thoughts - - 0  PHQ-9 Score - - 0  Difficult doing work/chores - - Not difficult at all    CHA2DS2/VAS Stroke Risk Points  Current as of 4 minutes ago     4 >= 2 Points: High Risk  1 - 1.99 Points: Medium Risk  0 Points: Low Risk    Last Change: N/A      Details    This score determines the patient's risk of having a stroke if the  patient has atrial fibrillation.       Points Metrics  0 Has Congestive Heart Failure:  No    Current as of 4 minutes ago  0 Has Vascular Disease:  No    Current as of 4 minutes ago  1 Has Hypertension:  Yes    Current as of 4 minutes ago  1 Age:  29    Current as of 4 minutes ago  0 Has Diabetes:  No    Current as of 4 minutes ago  2 Had Stroke:  Yes  Had TIA:  No  Had Thromboembolism:  No    Current as of 4 minutes ago  0 Male:  No    Current as of 4 minutes ago       Social History   Tobacco Use  Smoking Status Never  Smokeless Tobacco  Never   BP Readings from Last 3 Encounters:  01/30/21 119/72  01/13/21 122/80  12/07/20 124/68   Pulse Readings from Last 3 Encounters:  01/30/21 (!) 58  01/13/21 60  12/07/20 74   Wt Readings from Last 3 Encounters:  01/30/21 179 lb (81.2 kg)  01/13/21 177 lb 6.4 oz (80.5 kg)  12/07/20 177 lb 9.6 oz (80.6 kg)   BMI Readings from Last 3 Encounters:  01/30/21 28.04 kg/m  01/13/21 27.78 kg/m  12/07/20 27.82 kg/m    Assessment/Interventions: Review of patient past medical history, allergies, medications, health status, including review of consultants reports, laboratory and other test data, was performed as part of comprehensive evaluation and provision of chronic care management services.   SDOH:  (Social Determinants of Health) assessments and interventions performed: No  SDOH Screenings   Alcohol Screen: Low Risk    Last Alcohol Screening Score (AUDIT): 0  Depression (PHQ2-9): Low Risk    PHQ-2 Score: 0  Financial Resource Strain: Low Risk    Difficulty of Paying Living Expenses: Not hard at all  Food Insecurity: No Food Insecurity   Worried About Charity fundraiser in the Last Year: Never true   Ran Out of Food in the Last Year: Never true  Housing: Low Risk    Last Housing Risk Score: 0  Physical Activity: Insufficiently Active   Days of Exercise per Week: 3 days   Minutes of Exercise per Session: 30 min  Social Connections: Moderately Integrated   Frequency of Communication with Friends and Family: Twice a week   Frequency of Social Gatherings with Friends and Family: Twice a week   Attends Religious Services: More than 4 times per year   Active Member of [employer] or Organizations: No   Attends Music therapist: Never   Marital Status: Married  Stress: No Stress Concern Present   Feeling of Stress : Not at all  Tobacco Use: Low Risk    Smoking Tobacco Use: Never   Smokeless Tobacco Use: Never   Passive Exposure: Not on file  Transportation  Needs: No Transportation Needs   Lack of Transportation (Medical): No   Lack of Transportation (Non-Medical): No    CCM Care Plan  Allergies  Allergen Reactions   Aspirin Other (See Comments)    REACTION: ULCER '@[email or web address]'  DOSES   Hydrochlorothiazide Other (See Comments)    Leg cramps   Irbesartan Other (See Comments)    Headaches    Symbicort [Budesonide-Formoterol Fumarate] Other (See Comments)    legcramps    Medications Reviewed Today     Reviewed by Viona Gilmore, [facility] (Pharmacist) on 03/07/21 at 1120  Med List Status: <None>   Medication Order Taking? Sig Documenting Provider Last Dose Status Informant  albuterol (VENTOLIN HFA) 108 (90 Base) MCG/ACT inhaler [id number]  1 puff [provider]  Active            Med Note Johnette Abraham Claiborne Rigg Jan 30, 2021 12:05 PM) As needed.  amLODipine (NORVASC) 10 MG tablet [id number]  Take 10 mg by mouth daily. [provider]  Active   apixaban (ELIQUIS) 5 MG TABS tablet [id number]  Take 1 tablet (5 mg total) by mouth 2 (two) times daily for 30 days. Damita Lack, MD  Expired 01/30/21 2359   atorvastatin (LIPITOR) 10 MG tablet [id number]  TAKE 1 TABLET AT BEDTIME Laurey Morale, MD  Active   diltiazem (CARDIZEM) 30 MG tablet [id number]  Take one tablet as needed for heart rates greater than 100 Laurey Morale, MD  Active            Med Note [facility] Claiborne Rigg Jan 30, 2021 12:05 PM) As needed.  famotidine (PEPCID) 20 MG tablet [id number]  Take 1 tablet (20 mg total) by mouth as needed for heartburn or indigestion. Laurey Morale, MD  Active  Med Note Johnette Abraham Claiborne Rigg Jan 30, 2021 12:06 PM) As needed.  losartan (COZAAR) 50 MG tablet [id number] Yes Take 1 tablet (50 mg total) by mouth daily. Minus Breeding, MD Taking Active   metoprolol tartrate (LOPRESSOR) 25 MG tablet [id number] Yes Take 0.5 tablets (12.5 mg total) by mouth 2 (two) times daily. Deberah Pelton, NP Taking  Active   sildenafil (VIAGRA) 50 MG tablet [id number]  Take 1 tablet (50 mg total) by mouth as needed. Laurey Morale, MD  Active            Med Note Nigel Mormon, [facility]   Tue Oct 11, 2020  1:41 PM) As needed            Patient Active Problem List   Diagnosis Date Noted   Tinnitus, bilateral 12/07/2019   Numbness 07/29/2018   Educated about COVID-19 virus infection 07/29/2018   Cerebrovascular accident (CVA) due to embolism of cerebral artery ([place]) 05/16/2018   Dyslipidemia 05/16/2018   Nodule of upper lobe of right lung 04/29/2018   Bilateral carotid artery stenosis 04/29/2018   Multiple thyroid nodules 04/29/2018   Acute CVA (cerebrovascular accident) ([place]) 04/19/2018   BPH with urinary obstruction 02/21/2017   NSVT (nonsustained ventricular tachycardia) 09/27/2016   PAF (paroxysmal atrial fibrillation) ([place]) 09/25/2016   Genital herpes 02/18/2016   Hyperglycemia 02/18/2016   Left knee pain 02/01/2015   Restless leg syndrome 03/30/2013   Allergic rhinitis 10/31/2009   Headache 10/31/2009   DEGENERATIVE [place] DISEASE, CERVICAL SPINE, W/RADICULOPATHY 07/28/2008   Essential hypertension 07/08/2007   Asthma 07/02/2007   Hyperlipidemia 05/02/2007   ERECTILE DYSFUNCTION, MILD 05/02/2007   PVC (premature ventricular contraction) 05/02/2007   GERD (gastroesophageal reflux disease) 05/02/2007    Immunization History  Administered Date(s) Administered   Fluad Quad(high Dose 65+) 11/11/2018, 11/28/2020   Influenza Split 12/18/2010, 01/04/2012   Influenza Whole 12/20/2009   Influenza, High Dose Seasonal PF 12/17/2012, 12/13/2015, 11/26/2016, 12/06/2017, 12/18/2019   Influenza,inj,Quad PF,6+ Mos 11/12/2013, 01/06/2015   PFIZER(Purple Top)SARS-COV-2 Vaccination 03/25/2019, 04/15/2019, 12/02/2019   Pneumococcal Conjugate-13 11/12/2013   Pneumococcal Polysaccharide-23 06/13/2011   Td 04/23/2003   Tdap 11/27/2016   Zoster, Live 01/16/2013    Conditions to be addressed/monitored:   Hypertension, Hyperlipidemia, Atrial Fibrillation, GERD, BPH, Allergic Rhinitis, and SOB, ED, muscle spasms, HSV prophylaxis  Conditions addressed this visit: Hypertension, Afib, insomnia  Care Plan : [place]  Updates made by Viona Gilmore, [place] since 03/07/2021 12:00 AM     Problem: Problem: Hypertension, Hyperlipidemia, Atrial Fibrillation, GERD, BPH, Allergic Rhinitis, and SOB, ED, muscle spasms, HSV prophylaxis      Long-Range Goal: Patient-Specific Goal   Start Date: 09/06/2020  Expected End Date: 09/06/2021  Recent Progress: On track  Priority: High  Note:   Current Barriers:  Unable to independently monitor therapeutic efficacy  Pharmacist Clinical Goal(s):  Patient will achieve adherence to monitoring guidelines and medication adherence to achieve therapeutic efficacy through collaboration with PharmD and provider.   Interventions: 1:1 collaboration with Laurey Morale, MD regarding development and update of comprehensive plan of care as evidenced by provider attestation and co-signature Inter-disciplinary care team collaboration (see longitudinal plan of care) Comprehensive medication review performed; medication list updated in electronic medical record  Hypertension (BP goal <130/80) -Not ideally controlled -Current treatment: Amlodipine 10 mg 1 tablet daily - in AM - appropriate, effective, safe, accessible Losartan 50 mg 1 tablet daily - in AM  - appropriate, effective, safe, accessible Metoprolol  tartrate 25 mg 1/2 tablet twice daily - appropriate, effective, safe, accessible -Medications previously tried: none  -Current home readings: does not check routinely at home -Current dietary habits: did not discuss -Current exercise habits: did not discuss -Denies hypotensive/hypertensive symptoms -Educated on BP goals and benefits of medications for prevention of heart attack, stroke and kidney damage; Importance of home blood pressure  monitoring; Proper BP monitoring technique; Symptoms of hypotension and importance of maintaining adequate hydration; -Counseled to monitor BP at home weekly, document, and provide log at future appointments -Counseled on diet and exercise extensively Recommended to continue current medication Recommended moving amlodipine to evening time to even out BP medication coverage  Hyperlipidemia: (LDL goal < 70) -Controlled -Current treatment: Atorvastatin 10 mg 1 tablet at bedtime - appropriate, effective, safe, accessible -Medications previously tried: simvastatin  -Current dietary patterns: did not discuss -Current exercise habits: did not discuss -Educated on Cholesterol goals;  Benefits of statin for ASCVD risk reduction; Importance of limiting foods high in cholesterol; -Counseled on diet and exercise extensively Recommended to continue current medication Consider increasing atorvastatin for further TG lowering  Pre-diabetes (A1c goal <6.5%) -Controlled -Current medications: No medications -Medications previously tried: none  -Current home glucose readings fasting glucose: does not need to check post prandial glucose: does not need to check -Denies hypoglycemic/hyperglycemic symptoms -Current meal patterns:  breakfast: did not discuss  lunch: did not discuss   dinner: did not discuss  snacks: did not discuss  drinks: did not discuss  -Current exercise: not routinely -Educated on A1c and blood sugar goals; Counseled to check feet daily and get yearly eye exams -Counseled to check feet daily and get yearly eye exams -Counseled on diet and exercise extensively   Atrial Fibrillation (Goal: prevent stroke and major bleeding) -Controlled -CHADSVASC: 4 -Current treatment: Rate control: metoprolol tartrate 25 mg 1/2 tablet twice daily; diltiazem 30 mg 1 tablet as needed for HR > 100 - appropriate, effective, safe, accessible Anticoagulation: Eliquis 5 mg 1 tablet twice daily -  appropriate, effective, safe, accessible -Medications previously tried: metoprolol (unknown) -Home BP and HR readings: 55-68; 128/70, 139/77, 125/71, 132/72, 120/66, 119/71 -Counseled on avoidance of NSAIDs due to increased bleeding risk with anticoagulants; -Recommended to continue current medication  GERD (Goal: minimize symptoms) -Controlled -Current treatment  Famotidine 20 mg 1 tablet as needed -Medications previously tried: none  -Recommended to continue current medication  Shortness of breath (Goal: improve breathing) -Controlled -Current treatment  Albuterol HFA inhale 2 puffs every 6 hours as needed (not used in several years) - appropriate, effective, safe, accessible -Medications previously tried: none  -Recommended to continue current medication  ED (Goal: minimize symptoms) -Controlled -Current treatment  Sildenafil 50 mg 1 tablet as needed - appropriate, effective, safe, accessible -Medications previously tried: none  -Recommended to continue current medication  Muscle spasms (Goal: minimize symptoms) -Controlled -Current treatment  Metaxalone 800 mg 1 tablet twice daily as needed -Medications previously tried: none  -Recommended to continue current medication  HSV prophylaxis (Goal: prevent HSV infections) -Controlled -Current treatment  Acyclovir 200 mg 1 capsule every other day -Medications previously tried: none  -Recommended to continue current medication  Insomnia (Goal: improve quality and quantity of sleep) -Not ideally controlled -Current treatment  Tylenol PM at bedtime as needed - query appropriate -Medications previously tried: melatonin (ineffective)  -Recommended discussion with PCP for safer alternative.   Health Maintenance -Vaccine gaps: shingrix, COVID booster -Current therapy:  None -Educated on Cost vs benefit of each product must be carefully  weighed by individual consumer -Patient is satisfied with current therapy and denies  issues -Recommended to continue as is  Patient Goals/Self-Care Activities Patient will:  - take medications as prescribed check blood pressure weekly, document, and provide at future appointments target a minimum of 150 minutes of moderate intensity exercise weekly  Follow Up Plan: Telephone follow up appointment with care management team member scheduled for:6 months        Medication Assistance: None required.  Patient affirms current coverage meets needs.  Compliance/Adherence/Medication fill history: Care Gaps: Shingrix Last BP - 119/72 on 01/30/2021 Last A1C - 6.2 on 03/15/2020  [facility] Drugs: Atorvastatin 10 mg last filled 12/15/2020 90 DS at [facility] Losartan 50 mg last filled 01/30/2021 90 DS at [facility]  Patient's preferred pharmacy is:  [place], [place] [address] Phone: [phone] Fax: [phone]  [facility] [address] - [place], [place]. [place] [address] Phone: [phone] Fax: [phone]  [facility] [place], [place] [facility] [address] [facility] [address] [place] [address] Phone: [phone] Fax: [phone]   Uses pill box? Yes - weekly and uses timer Pt endorses 100% compliance  We discussed: Current pharmacy is preferred with insurance plan and patient is satisfied with pharmacy services Patient decided to: Continue current medication management strategy  Care Plan and Follow Up Patient Decision:  Patient agrees to Care Plan and Follow-up.  Plan: Telephone follow up appointment with care management team member scheduled for:  6 months  [name], PharmD, [place] Pharmacist [facility] [facility] [phone]

## 2021-03-17 ENCOUNTER — Encounter: Payer: Self-pay | Admitting: Family Medicine

## 2021-03-17 ENCOUNTER — Ambulatory Visit (INDEPENDENT_AMBULATORY_CARE_PROVIDER_SITE_OTHER): Payer: Medicare Other | Admitting: Family Medicine

## 2021-03-17 VITALS — BP 120/78 | HR 53 | Temp 98.6°F | Ht 67.0 in | Wt 182.4 lb

## 2021-03-17 DIAGNOSIS — I6340 Cerebral infarction due to embolism of unspecified cerebral artery: Secondary | ICD-10-CM | POA: Diagnosis not present

## 2021-03-17 DIAGNOSIS — N401 Enlarged prostate with lower urinary tract symptoms: Secondary | ICD-10-CM

## 2021-03-17 DIAGNOSIS — E785 Hyperlipidemia, unspecified: Secondary | ICD-10-CM | POA: Diagnosis not present

## 2021-03-17 DIAGNOSIS — N138 Other obstructive and reflux uropathy: Secondary | ICD-10-CM

## 2021-03-17 DIAGNOSIS — J4520 Mild intermittent asthma, uncomplicated: Secondary | ICD-10-CM | POA: Diagnosis not present

## 2021-03-17 DIAGNOSIS — I10 Essential (primary) hypertension: Secondary | ICD-10-CM

## 2021-03-17 DIAGNOSIS — R739 Hyperglycemia, unspecified: Secondary | ICD-10-CM

## 2021-03-17 DIAGNOSIS — K219 Gastro-esophageal reflux disease without esophagitis: Secondary | ICD-10-CM

## 2021-03-17 DIAGNOSIS — M5020 Other cervical disc displacement, unspecified cervical region: Secondary | ICD-10-CM

## 2021-03-17 DIAGNOSIS — I480 Paroxysmal atrial fibrillation: Secondary | ICD-10-CM | POA: Diagnosis not present

## 2021-03-17 DIAGNOSIS — E042 Nontoxic multinodular goiter: Secondary | ICD-10-CM | POA: Diagnosis not present

## 2021-03-17 LAB — BASIC METABOLIC PANEL
BUN: 18 mg/dL (ref 6–23)
CO2: 28 mEq/L (ref 19–32)
Calcium: 9 mg/dL (ref 8.4–10.5)
Chloride: 104 mEq/L (ref 96–112)
Creatinine, Ser: 1.08 mg/dL (ref 0.40–1.50)
GFR: 67.37 mL/min (ref >60.00)
Glucose, Bld: 99 mg/dL (ref 70–99)
Potassium: 4 mEq/L (ref 3.5–5.1)
Sodium: 141 mEq/L (ref 135–145)

## 2021-03-17 LAB — CBC WITH DIFFERENTIAL/PLATELET
Basophils Absolute: 0 10*3/uL (ref 0.0–0.1)
Basophils Relative: 0.4 % (ref 0.0–3.0)
Eosinophils Absolute: 0.4 10*3/uL (ref 0.0–0.7)
Eosinophils Relative: 4.2 % (ref 0.0–5.0)
HCT: 43.7 % (ref 39.0–52.0)
Hemoglobin: 14.4 g/dL (ref 13.0–17.0)
Lymphocytes Relative: 25.3 % (ref 12.0–46.0)
Lymphs Abs: 2.5 10*3/uL (ref 0.7–4.0)
MCHC: 32.8 g/dL (ref 30.0–36.0)
MCV: 84.6 fl (ref 78.0–100.0)
Monocytes Absolute: 0.5 10*3/uL (ref 0.1–1.0)
Monocytes Relative: 5.5 % (ref 3.0–12.0)
Neutro Abs: 6.4 10*3/uL (ref 1.4–7.7)
Neutrophils Relative %: 64.6 % (ref 43.0–77.0)
Platelets: 332 10*3/uL (ref 150.0–400.0)
RBC: 5.17 Mil/uL (ref 4.22–5.81)
RDW: 14.1 % (ref 11.5–15.5)
WBC: 9.9 10*3/uL (ref 4.0–10.5)

## 2021-03-17 LAB — HEPATIC FUNCTION PANEL
ALT: 18 U/L (ref 0–53)
AST: 15 U/L (ref 0–37)
Albumin: 4.2 g/dL (ref 3.5–5.2)
Alkaline Phosphatase: 81 U/L (ref 39–117)
Bilirubin, Direct: 0.1 mg/dL (ref 0.0–0.3)
Total Bilirubin: 0.5 mg/dL (ref 0.2–1.2)
Total Protein: 6.8 g/dL (ref 6.0–8.3)

## 2021-03-17 LAB — LIPID PANEL
Cholesterol: 160 mg/dL (ref 0–200)
HDL: 35.1 mg/dL — ABNORMAL LOW (ref >39.00)
LDL Cholesterol: 86 mg/dL (ref 0–99)
NonHDL: 125.25
Total CHOL/HDL Ratio: 5
Triglycerides: 196 mg/dL — ABNORMAL HIGH (ref 0.0–149.0)
VLDL: 39.2 mg/dL (ref 0.0–40.0)

## 2021-03-17 LAB — TSH: TSH: 1.55 u[IU]/mL (ref 0.35–5.50)

## 2021-03-17 LAB — T3, FREE: T3, Free: 3.3 pg/mL (ref 2.3–4.2)

## 2021-03-17 LAB — T4, FREE: Free T4: 0.92 ng/dL (ref 0.60–1.60)

## 2021-03-17 LAB — HEMOGLOBIN A1C: Hgb A1c MFr Bld: 6.3 % (ref 4.6–6.5)

## 2021-03-17 LAB — PSA: PSA: 2.56 ng/mL (ref 0.10–4.00)

## 2021-03-17 MED ORDER — APIXABAN 5 MG PO TABS: 5.0000 mg | ORAL_TABLET | Freq: Two times a day (BID) | ORAL | 0 refills | Status: AC

## 2021-03-17 MED ORDER — DILTIAZEM HCL 30 MG PO TABS: ORAL_TABLET | ORAL | 0 refills | Status: DC

## 2021-03-17 MED ORDER — ATORVASTATIN CALCIUM 10 MG PO TABS: 10.0000 mg | ORAL_TABLET | Freq: Every day | ORAL | 3 refills | Status: DC

## 2021-03-17 MED ORDER — METOPROLOL TARTRATE 25 MG PO TABS: 12.5000 mg | ORAL_TABLET | Freq: Two times a day (BID) | ORAL | 3 refills | Status: DC

## 2021-03-17 MED ORDER — AMLODIPINE BESYLATE 10 MG PO TABS: 10.0000 mg | ORAL_TABLET | Freq: Every day | ORAL | 3 refills | Status: DC

## 2021-03-17 MED ORDER — LOSARTAN POTASSIUM 50 MG PO TABS: 50.0000 mg | ORAL_TABLET | Freq: Every day | ORAL | 3 refills | Status: DC

## 2021-03-17 NOTE — Progress Notes (Signed)
Subjective:    Patient ID: Luke [name], male    DOB: 22-Feb-1946, 76 y.o.   MRN: [id number]  HPI Here to follow up on issues. He is doing well. He saw Dr. Percival Spanish in November, and he decreased the dose of Losartan due to some low BP's. He has not had a bout of atrial fibrillation in at least 5 months. Not only can he feel them, but he also wears a Fit Bit at all times to monitor himself. He is walking for exercise. His asthma and GERD are stable. His BPH is stable.    Review of Systems  Constitutional: Negative.   HENT: Negative.    Eyes: Negative.   Respiratory: Negative.    Cardiovascular: Negative.   Gastrointestinal: Negative.   Genitourinary: Negative.   Musculoskeletal: Negative.   Skin: Negative.   Neurological: Negative.   Psychiatric/Behavioral: Negative.        Objective:   Physical Exam Constitutional:      General: He is not in acute distress.    Appearance: Normal appearance. He is well-developed. He is not diaphoretic.  HENT:     Head: Normocephalic and atraumatic.     Right Ear: External ear normal.     Left Ear: External ear normal.     Nose: Nose normal.     Mouth/Throat:     Pharynx: No oropharyngeal exudate.  Eyes:     General: No scleral icterus.       Right eye: No discharge.        Left eye: No discharge.     Conjunctiva/sclera: Conjunctivae normal.     Pupils: Pupils are equal, round, and reactive to light.  Neck:     Thyroid: No thyromegaly.     Vascular: No JVD.     Trachea: No tracheal deviation.  Cardiovascular:     Rate and Rhythm: Normal rate and regular rhythm.     Heart sounds: Normal heart sounds. No murmur heard.   No friction rub. No gallop.  Pulmonary:     Effort: Pulmonary effort is normal. No respiratory distress.     Breath sounds: Normal breath sounds. No wheezing or rales.  Chest:     Chest wall: No tenderness.  Abdominal:     General: Bowel sounds are normal. There is no distension.     Palpations: Abdomen is  soft. There is no mass.     Tenderness: There is no abdominal tenderness. There is no guarding or rebound.  Genitourinary:    Penis: No tenderness.   Musculoskeletal:        General: No tenderness. Normal range of motion.     Cervical back: Neck supple.  Lymphadenopathy:     Cervical: No cervical adenopathy.  Skin:    General: Skin is warm and dry.     Coloration: Skin is not pale.     Findings: No erythema or rash.  Neurological:     Mental Status: He is alert and oriented to person, place, and time.     Cranial Nerves: No cranial nerve deficit.     Motor: No abnormal muscle tone.     Coordination: Coordination normal.     Deep Tendon Reflexes: Reflexes are normal and symmetric. Reflexes normal.  Psychiatric:        Behavior: Behavior normal.        Thought Content: Thought content normal.        Judgment: Judgment normal.          Assessment &  Plan:  He is doing well as far as CAD, HTN, and PAF. His asthma and GERD are stable. We will get fasting labs to day to check lipids, A1c, etc. We spent a total of (   ) minutes reviewing records and discussing these issues.  [name], MD

## 2021-03-20 ENCOUNTER — Other Ambulatory Visit: Payer: Self-pay | Admitting: Family Medicine

## 2021-03-21 ENCOUNTER — Ambulatory Visit (INDEPENDENT_AMBULATORY_CARE_PROVIDER_SITE_OTHER): Payer: Medicare Other | Admitting: Family Medicine

## 2021-03-21 ENCOUNTER — Encounter: Payer: Self-pay | Admitting: Family Medicine

## 2021-03-21 VITALS — BP 128/78 | HR 59 | Temp 98.3°F | Wt 183.0 lb

## 2021-03-21 DIAGNOSIS — I6340 Cerebral infarction due to embolism of unspecified cerebral artery: Secondary | ICD-10-CM | POA: Diagnosis not present

## 2021-03-21 DIAGNOSIS — E785 Hyperlipidemia, unspecified: Secondary | ICD-10-CM

## 2021-03-21 NOTE — Progress Notes (Signed)
° °  Subjective:    Patient ID: Luke [name], male    DOB: Mar 08, 1945, 76 y.o.   MRN: [id number]  HPI He was here for a well exam on 03-17-21 and he had labs drawn. He is here to day to go over these results. These were unremarkable except for the CBC. As usual his LDL was excellent (he takes Atorvastatin) but the TG were slightly high at 196 and the HDL slightly low at 35. He asks what he can do about these. He already tries to eat a low fat diet.    Review of Systems  Constitutional: Negative.       Objective:   Physical Exam Constitutional:      Appearance: Normal appearance.  Cardiovascular:     Rate and Rhythm: Normal rate and regular rhythm.     Pulses: Normal pulses.     Heart sounds: Normal heart sounds.  Pulmonary:     Effort: Pulmonary effort is normal.     Breath sounds: Normal breath sounds.  Neurological:     Mental Status: He is alert.          Assessment & Plan:  Dyslipidemia. He will stay on Atorvastatin. I also suggested he get more aerobic exercise and he can take Omega 3 fatty acids OTC. Recheck labs in 6 months.  [name], MD

## 2021-03-29 ENCOUNTER — Telehealth: Payer: Self-pay | Admitting: Pharmacist

## 2021-03-29 NOTE — Telephone Encounter (Signed)
Patient called me to ask about his fish oil supplement that he just started. He inquired about starting the fish oil with him being on a blood thinner as the warning on the back of the bottle says to consult a healthcare provider about this.  Recommended not taking more than 2 of the 500 mg dose of fish oil to limit increased risk of bleeding with him being on Eliquis already.

## 2021-04-04 DIAGNOSIS — I4891 Unspecified atrial fibrillation: Secondary | ICD-10-CM | POA: Diagnosis not present

## 2021-04-04 DIAGNOSIS — I10 Essential (primary) hypertension: Secondary | ICD-10-CM | POA: Diagnosis not present

## 2021-04-24 ENCOUNTER — Ambulatory Visit: Payer: Medicare Other | Admitting: Cardiology

## 2021-05-10 DIAGNOSIS — L814 Other melanin hyperpigmentation: Secondary | ICD-10-CM | POA: Diagnosis not present

## 2021-05-10 DIAGNOSIS — D225 Melanocytic nevi of trunk: Secondary | ICD-10-CM | POA: Diagnosis not present

## 2021-05-10 DIAGNOSIS — L821 Other seborrheic keratosis: Secondary | ICD-10-CM | POA: Diagnosis not present

## 2021-05-10 DIAGNOSIS — L309 Dermatitis, unspecified: Secondary | ICD-10-CM | POA: Diagnosis not present

## 2021-05-21 NOTE — Progress Notes (Signed)
?  ? ?Cardiology Office Note ? ? ? ?Date:  05/22/2021  ? ?ID:  Luke [name], DOB Oct 15, 1945, MRN [id number] ? ?PCP:  Laurey Morale, MD  ?Cardiologist:   Minus Breeding, MD ? ? ?Chief Complaint  ?Patient presents with  ? Atrial Fibrillation  ? ? ?  ?History of Present Illness: ?Luke [name] is a 76 y.o. male who presents for follow up of atrial fib.    He was found to have atrial fib in the context of a URI.  He had a negative ischemia work up and unremarkable echo  He did not want to continue with the [place].   He was in the hospital with a CVA.   This appeared to be embolic. He was subsequently put on Eliquis.  He was in the emergency room in August 2022 with atrial fibrillation.  He left without being seen.   While he was waiting in the emergency room he went back into sinus rhythm .  He had another episode of atrial fibrillation in October 2022.    ? ?He returns now says that his last episode was in January lasted for about 24 hours.  He does not notice any improvement when he takes Cardizem but he might notice some improvement if he takes half of metoprolol.  He does have a trigger which seems to be alcohol.  He has been trying to avoid this.  He has not been exercising as much as I would like.  With his routine of daily living the patient denies any new symptoms such as chest discomfort, neck or arm discomfort. There has been no new shortness of breath, PND or orthopnea. There have been no reported palpitations, presyncope or syncope.  ? ?Past Medical History:  ?Diagnosis Date  ? Asthma   ? Depression   ? ED (erectile dysfunction)   ? Hyperlipidemia   ? Hypertension   ? Paroxysmal atrial fibrillation ([place]) 09/2016  ? Initial diagnosis was in setting of acute URI/asthma attack  ? Solitary pulmonary nodule 04/16/2013  ? 03/30/13  CXR Question left nipple shadow.  10 mm ovoid nodular density right upper lobe, cannot exclude  pulmonary mass/ nodule > f/u cxr 04/29/2013 > no nodule    ? Stroke [facility])   ? Ulcer    ? ? ?Past Surgical History:  ?Procedure Laterality Date  ? COLONOSCOPY  05/15/2019  ? per Dr. Wilford Corner, clear, no repeats needed   ? NM MYOVIEW LTD  10/2016  ? ailable, images/films reviewed: From Epic Chart or Care Everywhere)  ? TRANSTHORACIC ECHOCARDIOGRAM  09/2016  ? Normal systolic function. EF 5560%. No RWMA. Gr 1 DD. Mild AoV calcification - no stenosis. Atrial Septum - lipomatous hypertrophy. --Essentially normal.  ? ? ? ?Current Outpatient Medications  ?Medication Sig Dispense Refill  ? albuterol (VENTOLIN HFA) 108 (90 Base) MCG/ACT inhaler 1 puff    ? amLODipine (NORVASC) 10 MG tablet Take 1 tablet (10 mg total) by mouth daily. 90 tablet 3  ? apixaban (ELIQUIS) 5 MG TABS tablet Take 1 tablet (5 mg total) by mouth 2 (two) times daily. 60 tablet 0  ? atorvastatin (LIPITOR) 20 MG tablet Take 1 tablet (20 mg total) by mouth daily. 90 tablet 3  ? diltiazem (CARDIZEM) 30 MG tablet Take one tablet as needed for heart rates greater than 100 30 tablet 0  ? losartan (COZAAR) 50 MG tablet Take 1 tablet (50 mg total) by mouth daily. 90 tablet 3  ? metoprolol tartrate (LOPRESSOR)  25 MG tablet Take 0.5 tablets (12.5 mg total) by mouth 2 (two) times daily. 90 tablet 3  ? sildenafil (VIAGRA) 50 MG tablet Take 1 tablet (50 mg total) by mouth as needed. 30 tablet 3  ? ?No current facility-administered medications for this visit.  ? ? ?Allergies:   Aspirin, Hydrochlorothiazide, Irbesartan, and Symbicort [budesonide-formoterol fumarate]  ? ? ?ROS:  Please see the history of present illness.   Otherwise, review of systems are positive for none.   All other systems are reviewed and negative.  ? ? ?PHYSICAL EXAM: ?VS:  BP 132/76   Pulse (!) 55   Ht '5\' 7"'$  (1.702 m)   Wt 182 lb (82.6 kg)   SpO2 98%   BMI 28.51 kg/m?  , BMI Body mass index is 28.51 kg/m?. ?GENERAL:  Well appearing ?NECK:  No jugular venous distention, waveform within normal limits, carotid upstroke brisk and symmetric, no bruits, no  thyromegaly ?LUNGS:  Clear to auscultation bilaterally ?CHEST:  Unremarkable ?HEART:  PMI not displaced or sustained,S1 and S2 within normal limits, no S3, no S4, no clicks, no rubs, no murmurs ?ABD:  Flat, positive bowel sounds normal in frequency in pitch, no bruits, no rebound, no guarding, no midline pulsatile mass, no hepatomegaly, no splenomegaly ?EXT:  2 plus pulses throughout, no edema, no cyanosis no clubbing ? ? ?EKG:  EKG is  ordered today. ?Sinus rhythm, rate 55, right bundle branch block, no acute ST-T wave changes, no change from previous. ? ? ?Recent Labs: ?03/17/2021: ALT 18; BUN 18; Creatinine, Ser 1.08; Hemoglobin 14.4; Platelets 332.0; Potassium 4.0; Sodium 141; TSH 1.55  ? ? ?Lipid Panel ?   ?Component Value Date/Time  ? CHOL 160 03/17/2021 1049  ? TRIG 196.0 (H) 03/17/2021 1049  ? HDL 35.10 (L) 03/17/2021 1049  ? CHOLHDL 5 03/17/2021 1049  ? VLDL 39.2 03/17/2021 1049  ? [place] 86 03/17/2021 1049  ? LDLDIRECT 73.0 02/08/2015 0906  ? ?  ? ?Wt Readings from Last 3 Encounters:  ?05/22/21 182 lb (82.6 kg)  ?03/21/21 183 lb (83 kg)  ?03/17/21 182 lb 6 oz (82.7 kg)  ?  ? ? ?Other studies Reviewed: ?Additional studies/ records that were reviewed today include: Labs ?Review of the above records demonstrates: See elsewhere ? ?ASSESSMENT AND PLAN: ? ?ATRIAL FIB:  Mr. Luke [name] has a CHA2DS2 - VASc score of  4.   He tolerates anticoagulation.  He again does not want to consider ablation unless he is having more palpitations.  We talked about avoidance of alcohol.  ? ?HTN: The blood pressure is at target.  No change in therapy.  ? ?CAROTID DOPPLER: 60 to 79% right carotid stenosis (60% on angiogram) and 40 to 59% left stenosis.  He is followed at the [place].  He is due to see the vascular surgeon soon.  This is through the [place] system ? ?DYSLIPDEMIA:   LDL was elevated at 86 earlier this year and I am going to increase his Lipitor to 20 mg daily. \ ? ?ELEVATED A1C: This was up to 6.3 and is being followed  by Dr. Sarajane Jews.  We talked about diet control. ? ? ?Current medicines are reviewed at length with the patient today.  The patient does not have concerns regarding medicines. ? ?The following changes have been made:   As above ? ?Labs/ tests ordered today include:   ? ?Orders Placed This Encounter  ?Procedures  ? Lipid panel  ? Hepatic function panel  ? EKG 12-Lead  ? ? ? ? ?  Disposition:   FU with me in 6 months. ? ? ?Signed, ?Minus Breeding, MD  ?05/22/2021 12:40 PM    ?[place] ? ? ?

## 2021-05-22 ENCOUNTER — Other Ambulatory Visit: Payer: Self-pay

## 2021-05-22 ENCOUNTER — Encounter: Payer: Self-pay | Admitting: Cardiology

## 2021-05-22 ENCOUNTER — Ambulatory Visit (INDEPENDENT_AMBULATORY_CARE_PROVIDER_SITE_OTHER): Payer: Medicare Other | Admitting: Cardiology

## 2021-05-22 VITALS — BP 132/76 | HR 55 | Ht 67.0 in | Wt 182.0 lb

## 2021-05-22 DIAGNOSIS — I6523 Occlusion and stenosis of bilateral carotid arteries: Secondary | ICD-10-CM

## 2021-05-22 DIAGNOSIS — I480 Paroxysmal atrial fibrillation: Secondary | ICD-10-CM

## 2021-05-22 DIAGNOSIS — E785 Hyperlipidemia, unspecified: Secondary | ICD-10-CM

## 2021-05-22 DIAGNOSIS — I10 Essential (primary) hypertension: Secondary | ICD-10-CM

## 2021-05-22 MED ORDER — ATORVASTATIN CALCIUM 20 MG PO TABS: 20.0000 mg | ORAL_TABLET | Freq: Every day | ORAL | 3 refills | Status: DC

## 2021-05-22 NOTE — Patient Instructions (Signed)
Medication Instructions:  ?Increase Lipitor to 20 mg daily ?Continue all other medications ?*If you need a refill on your cardiac medications before your next appointment, please call your pharmacy* ? ? ?Lab Work: ?Have Lipid and Hepatic panels in 10 weeks ? ? ?Testing/Procedures: ?None ordered ? ? ?Follow-Up: ?At [facility], you and your health needs are our priority.  As part of our continuing mission to provide you with exceptional heart care, we have created designated Provider Care Teams.  These Care Teams include your primary Cardiologist (physician) and Advanced Practice Providers (APPs -  Physician Assistants and Nurse Practitioners) who all work together to provide you with the care you need, when you need it. ? ?We recommend signing up for the patient portal called "MyChart".  Sign up information is provided on this After Visit Summary.  MyChart is used to connect with patients for Virtual Visits (Telemedicine).  Patients are able to view lab/test results, encounter notes, upcoming appointments, etc.  Non-urgent messages can be sent to your provider as well.   ?To learn more about what you can do with MyChart, go to [email or web address].   ? ?Your next appointment:  6 months ?  ? ?The format for your next appointment: Office ? ? ?Provider:  Dr.Hochrein ? ? ?

## 2021-05-25 ENCOUNTER — Other Ambulatory Visit: Payer: Self-pay | Admitting: Family Medicine

## 2021-05-25 ENCOUNTER — Telehealth: Payer: Self-pay | Admitting: Pharmacist

## 2021-05-25 NOTE — Telephone Encounter (Signed)
Patient called me as he was wondering if he could get a refill on the albuterol. It is listed on his meds as historical as he does not use it often but would like to have one on hand in case he needs it as it was very helpful in the past for shortness of breath. He reports he believes Dr. Sarajane Jews prescribed it in the past but did not have one on hand to confirm. ? ?If possible, patient requested for the refill to be sent to [facility] ?

## 2021-05-26 ENCOUNTER — Telehealth: Payer: Self-pay | Admitting: Family Medicine

## 2021-05-26 MED ORDER — ALBUTEROL SULFATE HFA 108 (90 BASE) MCG/ACT IN AERS: INHALATION_SPRAY | RESPIRATORY_TRACT | 2 refills | Status: DC

## 2021-05-26 NOTE — Telephone Encounter (Signed)
Brooke with [employer] stated that the insurance needs clearer instructions/ order details for the albuterol (VENTOLIN HFA) 108 (90 Base) MCG/ACT inhaler [[id number]].  Jerene Pitch stated that she would need the instructions as well so that they could provide it to the patient when they come to pick it up. ? ?Jerene Pitch could be contacted at [phone]. ? ?Please advise. ?

## 2021-05-26 NOTE — Telephone Encounter (Signed)
Pt called back concerning albuterol and he is aware message was routed to  dr fry  ?

## 2021-05-26 NOTE — Telephone Encounter (Signed)
Last refill- 2020 ?Last OV- 03/21/2021 ? ?No future OV scheduled. ?

## 2021-05-26 NOTE — Telephone Encounter (Signed)
Rx already sent.

## 2021-05-26 NOTE — Telephone Encounter (Signed)
Called patient to make him aware the prescription was sent to [facility]. Patient verbalized his understanding. ?

## 2021-05-26 NOTE — Telephone Encounter (Signed)
I sent this in to [facility]  ?

## 2021-06-07 ENCOUNTER — Other Ambulatory Visit: Payer: Self-pay

## 2021-06-07 MED ORDER — ALBUTEROL SULFATE HFA 108 (90 BASE) MCG/ACT IN AERS: INHALATION_SPRAY | RESPIRATORY_TRACT | 3 refills | Status: AC

## 2021-08-02 DIAGNOSIS — I10 Essential (primary) hypertension: Secondary | ICD-10-CM | POA: Diagnosis not present

## 2021-08-02 DIAGNOSIS — E785 Hyperlipidemia, unspecified: Secondary | ICD-10-CM | POA: Diagnosis not present

## 2021-08-02 DIAGNOSIS — I6523 Occlusion and stenosis of bilateral carotid arteries: Secondary | ICD-10-CM | POA: Diagnosis not present

## 2021-08-02 DIAGNOSIS — I480 Paroxysmal atrial fibrillation: Secondary | ICD-10-CM | POA: Diagnosis not present

## 2021-08-02 LAB — HEPATIC FUNCTION PANEL
ALT: 13 IU/L (ref 0–44)
AST: 13 IU/L (ref 0–40)
Albumin: 4 g/dL (ref 3.7–4.7)
Alkaline Phosphatase: 101 IU/L (ref 44–121)
Bilirubin Total: 0.3 mg/dL (ref 0.0–1.2)
Bilirubin, Direct: 0.1 mg/dL (ref 0.00–0.40)
Total Protein: 6.5 g/dL (ref 6.0–8.5)

## 2021-08-02 LAB — LIPID PANEL
Chol/HDL Ratio: 4.5 ratio (ref 0.0–5.0)
Cholesterol, Total: 139 mg/dL (ref 100–199)
HDL: 31 mg/dL — ABNORMAL LOW (ref >39)
LDL Chol Calc (NIH): 76 mg/dL (ref 0–99)
Triglycerides: 188 mg/dL — ABNORMAL HIGH (ref 0–149)
VLDL Cholesterol Cal: 32 mg/dL (ref 5–40)

## 2021-08-09 DIAGNOSIS — R972 Elevated prostate specific antigen [PSA]: Secondary | ICD-10-CM | POA: Diagnosis not present

## 2021-08-09 DIAGNOSIS — N5201 Erectile dysfunction due to arterial insufficiency: Secondary | ICD-10-CM | POA: Diagnosis not present

## 2021-08-09 DIAGNOSIS — N400 Enlarged prostate without lower urinary tract symptoms: Secondary | ICD-10-CM | POA: Diagnosis not present

## 2021-08-10 ENCOUNTER — Encounter: Payer: Self-pay | Admitting: *Deleted

## 2021-08-25 ENCOUNTER — Ambulatory Visit (INDEPENDENT_AMBULATORY_CARE_PROVIDER_SITE_OTHER): Payer: Medicare Other | Admitting: Family Medicine

## 2021-08-25 ENCOUNTER — Encounter: Payer: Self-pay | Admitting: Family Medicine

## 2021-08-25 VITALS — BP 110/62 | HR 58 | Temp 98.5°F | Wt 181.0 lb

## 2021-08-25 DIAGNOSIS — I6523 Occlusion and stenosis of bilateral carotid arteries: Secondary | ICD-10-CM

## 2021-08-25 DIAGNOSIS — H9313 Tinnitus, bilateral: Secondary | ICD-10-CM

## 2021-08-25 MED ORDER — MECLIZINE HCL 25 MG PO TABS: 25.0000 mg | ORAL_TABLET | Freq: Four times a day (QID) | ORAL | 2 refills | Status: AC | PRN

## 2021-09-04 ENCOUNTER — Telehealth: Payer: Self-pay | Admitting: Pharmacist

## 2021-09-04 NOTE — Chronic Care Management (AMB) (Signed)
    Chronic Care Management Pharmacy Assistant   Name: Luke [name]  MRN: [id number] DOB: 1946/01/26  09/04/21 APPOINTMENT REMINDER  Patient was reminded to have all medications, supplements and any blood glucose and blood pressure readings available for review with [name], Pharm. D, for telephone visit on 09/06/21 at 10:30.    Care Gaps: Zoster Vaccine - Overdue COVID Booster - Overdue AWV- 10/22 BP- 110/62 08/25/21  Star Rating Drug: Atorvastatin 10 mg last filled 08/07/21 90 DS at [facility] Losartan 50 mg last filled 04/12/21 90 DS at [facility] Verified   Medications: Outpatient Encounter Medications as of 09/04/2021  Medication Sig Note   albuterol (VENTOLIN HFA) 108 (90 Base) MCG/ACT inhaler Inhale 2 puffs into the lungs every 6 hrs as needed for Wheezing or SOB    amLODipine (NORVASC) 10 MG tablet Take 1 tablet (10 mg total) by mouth daily.    apixaban (ELIQUIS) 5 MG TABS tablet Take 1 tablet (5 mg total) by mouth 2 (two) times daily. (Patient not taking: Reported on 08/25/2021)    atorvastatin (LIPITOR) 20 MG tablet Take 1 tablet (20 mg total) by mouth daily.    diltiazem (CARDIZEM) 30 MG tablet Take one tablet as needed for heart rates greater than 100    losartan (COZAAR) 50 MG tablet Take 1 tablet (50 mg total) by mouth daily.    meclizine (ANTIVERT) 25 MG tablet Take 1 tablet (25 mg total) by mouth every 6 (six) hours as needed for dizziness.    metoprolol tartrate (LOPRESSOR) 25 MG tablet Take 0.5 tablets (12.5 mg total) by mouth 2 (two) times daily.    sildenafil (VIAGRA) 50 MG tablet Take 1 tablet (50 mg total) by mouth as needed. 10/11/2020: As needed   No facility-administered encounter medications on file as of 09/04/2021.      [place] Clinical Pharmacist Assistant [phone]

## 2021-09-06 ENCOUNTER — Ambulatory Visit (INDEPENDENT_AMBULATORY_CARE_PROVIDER_SITE_OTHER): Payer: Medicare Other | Admitting: Pharmacist

## 2021-09-06 DIAGNOSIS — E785 Hyperlipidemia, unspecified: Secondary | ICD-10-CM

## 2021-09-06 DIAGNOSIS — I10 Essential (primary) hypertension: Secondary | ICD-10-CM

## 2021-09-06 NOTE — Progress Notes (Signed)
Chronic Care Management Pharmacy Note  09/06/2021 Name:  Luke [name] MRN:  [id number] DOB:  11-27-1945  Summary: BP mostly at goal < 130/80 per home readings LDL not quite at goal < 70, TGs not at goal < 150  Recommendations/Changes made from today's visit: -Recommended continued routine monitoring of BP at home -Recommended reaching out to cardiology to discuss holding metoprolol and PRN use due to low HR and fatigue  Plan: Follow up in 3 months for BP assessment Follow up in 6 months   Subjective: Luke [name] is an 76 y.o. year old male who is a primary patient of Laurey Morale, MD.  The CCM team was consulted for assistance with disease management and care coordination needs.    Engaged with patient by telephone for follow up visit in response to provider referral for pharmacy case management and/or care coordination services.   Consent to Services:  The patient was given information about Chronic Care Management services, agreed to services, and gave verbal consent prior to initiation of services.  Please see initial visit note for detailed documentation.   Patient Care Team: Laurey Morale, MD as PCP - General (Family Medicine) Minus Breeding, MD as PCP - Cardiology (Cardiology) Viona Gilmore, [facility] as Pharmacist (Pharmacist)  Recent office visits: 08/25/21 [name], MD: Patient presented for tinnitus. Referred to ENT and prescribed meclizine PRN.  03/21/21 [name], MD: Patient presented for dyslipidemia. Recommended taking fish oil 1000 mg daily and recheck lipids in 6 months.  03/17/21 [name], MD: Patient presented for chronic conditions follow up.  No medication changes.  Recent consult visits: 07/06/21 Raynelle Jan, MD ([facility]): Patient presented for carotid stenosis follow up. Plan for repeat U/S in 1 year.  05/22/21 Minus Breeding, MD (cardiology): Patient presented for Afib follow up with palpitations. Increased atorvastatin to 20 mg daily and  repeat lipid panel in 10 weeks.  05/11/21 Darrick Meigs, MD (dermatology): Patient presented for dermatitis follow up. Unable to access notes.  01/30/21 Minus Breeding, MD (cardiology): Patient presented for Afib follow up with palpitations. Decreased losartan to 50 mg daily.  01/24/21 Fortunato Curling ([place] PCP): Patient presented for transfer of care to this provider. Discussed reducing losartan dose but deferred to cardiology.  01/13/21 Coletta Memos, NP (cardiology): Patient presented for Afib follow up. D'c/d metoprolol succinate and switched to metoprolol tartrate 12.5 mg daily.   12/07/20 Coletta Memos, NP (cardiology): Patient presented for Afib follow up. Stopped carvedilol and switched to metoprolol succinate 25 mg 1/2 tablet daily.  Hospital visits: Medication Reconciliation was completed by comparing discharge summary, patient's EMR and Pharmacy list, and upon discussion with patient.   Patient presented to [facility] on 10-11-2020 for Dizziness Patient was presnet for 2 hours.    New?Medications Started at [facility] Discharge:?? -started  None   Medication Changes at Hospital Discharge: -Changed  None Medications Discontinued at Hospital Discharge: -Stopped  None   Medications that remain the same after Hospital Discharge:??  -All other medications will remain the same.     Objective:  Lab Results  Component Value Date   CREATININE 1.08 03/17/2021   BUN 18 03/17/2021   GFR 67.37 03/17/2021   GFRNONAA >60 10/11/2020   GFRAA >60 04/19/2018   NA 141 03/17/2021   K 4.0 03/17/2021   CALCIUM 9.0 03/17/2021   CO2 28 03/17/2021   GLUCOSE 99 03/17/2021    Lab Results  Component Value Date/Time   HGBA1C 6.3 03/17/2021 10:49  AM   HGBA1C 6.2 03/15/2020 11:22 AM   GFR 67.37 03/17/2021 10:49 AM   GFR 75.31 03/15/2020 11:22 AM   MICROALBUR 1.3 10/27/2013 09:27 AM    Last diabetic Eye exam: No results found for: "HMDIABEYEEXA"  Last diabetic Foot exam: No  results found for: "HMDIABFOOTEX"   Lab Results  Component Value Date   CHOL 139 08/02/2021   HDL 31 (L) 08/02/2021   LDLCALC 76 08/02/2021   LDLDIRECT 73.0 02/08/2015   TRIG 188 (H) 08/02/2021   CHOLHDL 4.5 08/02/2021       Latest Ref Rng & Units 08/02/2021   10:27 AM 03/17/2021   10:49 AM 03/15/2020   11:22 AM  Hepatic Function  Total Protein 6.0 - 8.5 g/dL 6.5  6.8  6.6   Albumin 3.7 - 4.7 g/dL 4.0  4.2  4.0   AST 0 - 40 IU/L _0 ALT 0 - 44 IU/L _1 Alk Phosphatase 44 - 121 IU/L 101  81  90   Total Bilirubin 0.0 - 1.2 mg/dL 0.3  0.5  0.4   Bilirubin, Direct 0.00 - 0.40 mg/dL 0.10  0.1  0.1     Lab Results  Component Value Date/Time   TSH 1.55 03/17/2021 10:49 AM   TSH 1.80 03/15/2020 11:22 AM   FREET4 0.92 03/17/2021 10:49 AM   FREET4 0.90 03/15/2020 11:22 AM       Latest Ref Rng & Units 03/17/2021   10:49 AM 10/11/2020   12:33 PM 03/15/2020   11:22 AM  CBC  WBC 4.0 - 10.5 K/uL 9.9  10.3  10.7   Hemoglobin 13.0 - 17.0 g/dL 14.4  14.0  13.9   Hematocrit 39.0 - 52.0 % 43.7  41.9  41.5   Platelets 150.0 - 400.0 K/uL 332.0  339  310.0     No results found for: "VD25OH"  Clinical ASCVD: Yes  The ASCVD Risk score (Arnett DK, et al., 2019) failed to calculate for the following reasons:   The patient has a prior MI or stroke diagnosis       03/17/2021   11:44 AM 12/28/2020   11:32 AM 12/28/2020   11:29 AM  Depression screen PHQ 2/9  Decreased Interest 0 0 0  Down, Depressed, Hopeless 0 0 0  PHQ - 2 Score 0 0 0  Altered sleeping 3    Tired, decreased energy 1    Change in appetite 0    Feeling bad or failure about yourself  0    Trouble concentrating 0    Moving slowly or fidgety/restless 0    Suicidal thoughts 0    PHQ-9 Score 4      CHA2DS2/VAS Stroke Risk Points  Current as of 4 minutes ago     4 >= 2 Points: High Risk  1 - 1.99 Points: Medium Risk  0 Points: Low Risk    Last Change: N/A      Details    This score determines the  patient's risk of having a stroke if the  patient has atrial fibrillation.       Points Metrics  0 Has Congestive Heart Failure:  No    Current as of 4 minutes ago  0 Has Vascular Disease:  No    Current as of 4 minutes ago  1 Has Hypertension:  Yes    Current as of 4 minutes ago  1 Age:  86    Current  as of 4 minutes ago  0 Has Diabetes:  No    Current as of 4 minutes ago  2 Had Stroke:  Yes  Had TIA:  No  Had Thromboembolism:  No    Current as of 4 minutes ago  0 Male:  No    Current as of 4 minutes ago       Social History   Tobacco Use  Smoking Status Never  Smokeless Tobacco Never   BP Readings from Last 3 Encounters:  08/25/21 110/62  05/22/21 132/76  03/21/21 128/78   Pulse Readings from Last 3 Encounters:  08/25/21 (!) 58  05/22/21 (!) 55  03/21/21 (!) 59   Wt Readings from Last 3 Encounters:  08/25/21 181 lb (82.1 kg)  05/22/21 182 lb (82.6 kg)  03/21/21 183 lb (83 kg)   BMI Readings from Last 3 Encounters:  08/25/21 28.35 kg/m  05/22/21 28.51 kg/m  03/21/21 28.66 kg/m    Assessment/Interventions: Review of patient past medical history, allergies, medications, health status, including review of consultants reports, laboratory and other test data, was performed as part of comprehensive evaluation and provision of chronic care management services.   SDOH:  (Social Determinants of Health) assessments and interventions performed: No  SDOH Screenings   Alcohol Screen: Low Risk  (12/28/2020)   Alcohol Screen    Last Alcohol Screening Score (AUDIT): 0  Depression (PHQ2-9): Low Risk  (03/17/2021)   Depression (PHQ2-9)    PHQ-2 Score: 4  Financial Resource Strain: Low Risk  (12/28/2020)   Overall Financial Resource Strain (CARDIA)    Difficulty of Paying Living Expenses: Not hard at all  Food Insecurity: No Food Insecurity (12/28/2020)   Hunger Vital Sign    Worried About Running Out of Food in the Last Year: Never true    [place] in the  Last Year: Never true  Housing: Low Risk  (12/28/2020)   Housing    Last Housing Risk Score: 0  Physical Activity: Insufficiently Active (12/28/2020)   Exercise Vital Sign    Days of Exercise per Week: 3 days    Minutes of Exercise per Session: 30 min  Social Connections: Moderately Integrated (12/28/2020)   Social Connection and Isolation Panel [NHANES]    Frequency of Communication with Friends and Family: Twice a week    Frequency of Social Gatherings with Friends and Family: Twice a week    Attends Religious Services: More than 4 times per year    Active Member of [employer] or Organizations: No    Attends Archivist Meetings: Never    Marital Status: Married  Stress: No Stress Concern Present (12/28/2020)   [place]    Feeling of Stress : Not at all  Tobacco Use: Low Risk  (08/25/2021)   Patient History    Smoking Tobacco Use: Never    Smokeless Tobacco Use: Never    Passive Exposure: Not on file  Transportation Needs: No Transportation Needs (12/28/2020)   PRAPARE - Transportation    Lack of Transportation (Medical): No    Lack of Transportation (Non-Medical): No    CCM Care Plan  Allergies  Allergen Reactions   Aspirin Other (See Comments)    REACTION: ULCER _0  DOSES   Hydrochlorothiazide Other (See Comments)    Leg cramps   Irbesartan Other (See Comments)    Headaches    Symbicort [Budesonide-Formoterol Fumarate] Other (See Comments)    legcramps    Medications Reviewed  Today     Reviewed by Viona Gilmore, [place] (Pharmacist) on 09/06/21 at 1107  Med List Status: <None>   Medication Order Taking? Sig Documenting Provider Last Dose Status Informant  albuterol (VENTOLIN HFA) 108 (90 Base) MCG/ACT inhaler [id number]  Inhale 2 puffs into the lungs every 6 hrs as needed for Wheezing or SOB Laurey Morale, MD  Active   amLODipine (NORVASC) 10 MG tablet [id number]  Take 1 tablet (10 mg  total) by mouth daily. Laurey Morale, MD  Active   apixaban Arne Cleveland) 5 MG TABS tablet [id number] Yes Take 1 tablet (5 mg total) by mouth 2 (two) times daily. Laurey Morale, MD Taking Active   atorvastatin (LIPITOR) 20 MG tablet [id number]  Take 1 tablet (20 mg total) by mouth daily. Minus Breeding, MD  Expired 08/25/21 2359   diltiazem (CARDIZEM) 30 MG tablet [id number]  Take one tablet as needed for heart rates greater than 100 Laurey Morale, MD  Active   losartan (COZAAR) 50 MG tablet [id number]  Take 1 tablet (50 mg total) by mouth daily. Laurey Morale, MD  Active   meclizine (ANTIVERT) 25 MG tablet [id number]  Take 1 tablet (25 mg total) by mouth every 6 (six) hours as needed for dizziness. Laurey Morale, MD  Active   metoprolol tartrate (LOPRESSOR) 25 MG tablet [id number]  Take 0.5 tablets (12.5 mg total) by mouth 2 (two) times daily. Laurey Morale, MD  Active   sildenafil (VIAGRA) 50 MG tablet [id number]  Take 1 tablet (50 mg total) by mouth as needed. Laurey Morale, MD  Active            Med Note Nigel Mormon, [facility]   Tue Oct 11, 2020  1:41 PM) As needed            Patient Active Problem List   Diagnosis Date Noted   Tinnitus, bilateral 12/07/2019   Numbness 07/29/2018   Educated about COVID-19 virus infection 07/29/2018   Cerebrovascular accident (CVA) due to embolism of cerebral artery ([place]) 05/16/2018   Dyslipidemia 05/16/2018   Nodule of upper lobe of right lung 04/29/2018   Bilateral carotid artery stenosis 04/29/2018   Multiple thyroid nodules 04/29/2018   Acute CVA (cerebrovascular accident) ([place]) 04/19/2018   BPH with urinary obstruction 02/21/2017   NSVT (nonsustained ventricular tachycardia) ([place]) 09/27/2016   PAF (paroxysmal atrial fibrillation) ([place]) 09/25/2016   Genital herpes 02/18/2016   Hyperglycemia 02/18/2016   Left knee pain 02/01/2015   Restless leg syndrome 03/30/2013   Allergic rhinitis 10/31/2009   Headache 10/31/2009   DEGENERATIVE [place] DISEASE,  CERVICAL SPINE, W/RADICULOPATHY 07/28/2008   Essential hypertension 07/08/2007   Asthma 07/02/2007   Hyperlipidemia 05/02/2007   ERECTILE DYSFUNCTION, MILD 05/02/2007   PVC (premature ventricular contraction) 05/02/2007   GERD (gastroesophageal reflux disease) 05/02/2007    Immunization History  Administered Date(s) Administered   Fluad Quad(high Dose 65+) 11/11/2018, 11/28/2020   Influenza Split 12/18/2010, 01/04/2012   Influenza Whole 12/20/2009   Influenza, High Dose Seasonal PF 12/17/2012, 12/13/2015, 11/26/2016, 12/06/2017, 12/18/2019   Influenza,inj,Quad PF,6+ Mos 11/12/2013, 01/06/2015   PFIZER(Purple Top)SARS-COV-2 Vaccination 03/25/2019, 04/15/2019, 12/02/2019   Pneumococcal Conjugate-13 11/12/2013   Pneumococcal Polysaccharide-23 06/13/2011   Td 04/23/2003   Tdap 11/27/2016   Zoster, Live 01/16/2013   Patient reports he doesn't think his activity level has changed a lot over the last few years. Patient is not doing much exercise right now but he is active during the day. Patient can try  to add in more exercise and he used to be into shag dancing in his 45s and really enjoyed that.   Patient does do a lot of sweets and carbs. He does need to work on cutting back on sweets and carbs and he knows some of it is what is causing his triglycerides to be elevated.  Patient is not drinking alcohol anymore because of it triggering his Afib and he even had 1 drink and this triggered. He does drink a non-alcoholic beer when people have a drink to avoid the Afib. He has an episode every 2-4 months with Afib. He wasn't sure if chocolate triggered it for him.  Patient inquired about medications making him feel fatigued. He wasn't sure if this was related to his medications or could be from his lower heart rate. Discussed mechanism of metoprolol and how this could cause fatigue and he will inquire with cardiology about taking a break from it.  Conditions to be addressed/monitored:   Hypertension, Hyperlipidemia, Atrial Fibrillation, GERD, BPH, Allergic Rhinitis, and SOB, ED, muscle spasms, HSV prophylaxis  Conditions addressed this visit: Hypertension, Afib, hyperlipidemia  Care Plan : CCM Pharmacy Care Plan  Updates made by Viona Gilmore, [place] since 09/06/2021 12:00 AM     Problem: Problem: Hypertension, Hyperlipidemia, Atrial Fibrillation, GERD, BPH, Allergic Rhinitis, and SOB, ED, muscle spasms, HSV prophylaxis      Long-Range Goal: Patient-Specific Goal   Start Date: 09/06/2020  Expected End Date: 09/06/2021  Recent Progress: On track  Priority: High  Note:   Current Barriers:  Unable to independently monitor therapeutic efficacy  Pharmacist Clinical Goal(s):  Patient will achieve adherence to monitoring guidelines and medication adherence to achieve therapeutic efficacy through collaboration with PharmD and provider.   Interventions: 1:1 collaboration with Laurey Morale, MD regarding development and update of comprehensive plan of care as evidenced by provider attestation and co-signature Inter-disciplinary care team collaboration (see longitudinal plan of care) Comprehensive medication review performed; medication list updated in electronic medical record  Hypertension (BP goal <130/80) -Not ideally controlled -Current treatment: Amlodipine 10 mg 1 tablet daily - in AM - Appropriate, Effective, Safe, Accessible Losartan 50 mg 1 tablet daily - in AM  - Appropriate, Effective, Safe, Accessible Metoprolol tartrate 25 mg 1/2 tablet twice daily - Appropriate, Effective, Safe, Accessible -Medications previously tried: none  -Current home readings: 138/78 (usually 130s/70s) -Current dietary habits: did not discuss -Current exercise habits: did not discuss -Denies hypotensive/hypertensive symptoms -Educated on BP goals and benefits of medications for prevention of heart attack, stroke and kidney damage; Importance of home blood pressure monitoring; Proper  BP monitoring technique; Symptoms of hypotension and importance of maintaining adequate hydration; -Counseled to monitor BP at home weekly, document, and provide log at future appointments -Counseled on diet and exercise extensively Recommended to continue current medication  Hyperlipidemia: (LDL goal < 70) -Not ideally controlled -Current treatment: Atorvastatin 20 mg 1 tablet at bedtime - Appropriate, Query effective, Safe, Accessible -Medications previously tried: simvastatin  -Current dietary patterns: did not discuss -Current exercise habits: did not discuss -Educated on Cholesterol goals;  Benefits of statin for ASCVD risk reduction; Importance of limiting foods high in cholesterol; -Counseled on diet and exercise extensively Recommended to continue current medication Counseled on benefits of exercise and limiting carb/sweets intake for triglyceride lowering.  Pre-diabetes (A1c goal <6.5%) -Controlled -Current medications: No medications -Medications previously tried: none  -Current home glucose readings fasting glucose: does not need to check post prandial glucose: does not need to  check -Denies hypoglycemic/hyperglycemic symptoms -Current meal patterns:  breakfast: did not discuss  lunch: did not discuss   dinner: did not discuss  snacks: did not discuss  drinks: did not discuss  -Current exercise: not routinely -Educated on A1c and blood sugar goals; Counseled to check feet daily and get yearly eye exams -Counseled to check feet daily and get yearly eye exams -Counseled on diet and exercise extensively   Atrial Fibrillation (Goal: prevent stroke and major bleeding) -Controlled -CHADSVASC: 4 -Current treatment: Rate control: metoprolol tartrate 25 mg 1/2 tablet twice daily; diltiazem 30 mg 1 tablet as needed for HR > 100 - Appropriate, Effective, Safe, Accessible Anticoagulation: Eliquis 5 mg 1 tablet twice daily - Appropriate, Effective, Safe,  Accessible -Medications previously tried: carvedilol (fatigue, bradycardia) -Home BP and HR readings: 55-68; , refer to above -Counseled on avoidance of NSAIDs due to increased bleeding risk with anticoagulants; -Recommended to continue current medication  GERD (Goal: minimize symptoms) -Controlled -Current treatment  Famotidine 20 mg 1 tablet as needed - Appropriate, Effective, Safe, Accessible -Medications previously tried: none  -Recommended to continue current medication  Shortness of breath (Goal: improve breathing) -Controlled -Current treatment  Albuterol HFA inhale 2 puffs every 6 hours as needed (not used in several years) - Appropriate, Effective, Safe, Accessible -Medications previously tried: none  -Recommended to continue current medication  ED (Goal: minimize symptoms) -Controlled -Current treatment  Sildenafil 50 mg 1 tablet as needed - Appropriate, Effective, Safe, Accessible -Medications previously tried: none  -Recommended to continue current medication  Muscle spasms (Goal: minimize symptoms) -Controlled -Current treatment  Metaxalone 800 mg 1 tablet twice daily as needed -Medications previously tried: none  -Recommended to continue current medication  HSV prophylaxis (Goal: prevent HSV infections) -Controlled -Current treatment  Acyclovir 200 mg 1 capsule every other day - Appropriate, Effective, Safe, Accessible -Medications previously tried: none  -Recommended to continue current medication  Insomnia (Goal: improve quality and quantity of sleep) -Not ideally controlled -Current treatment  Tylenol PM at bedtime as needed - Query Appropriate, Query effective, Query Safe, Accessible -Medications previously tried: melatonin (ineffective)  -Recommended discussion with PCP for safer alternative.   Health Maintenance -Vaccine gaps: shingrix, COVID booster -Current therapy:  None -Educated on Cost vs benefit of each product must be carefully weighed  by individual consumer -Patient is satisfied with current therapy and denies issues -Recommended to continue as is  Patient Goals/Self-Care Activities Patient will:  - take medications as prescribed check blood pressure weekly, document, and provide at future appointments target a minimum of 150 minutes of moderate intensity exercise weekly  Follow Up Plan: Telephone follow up appointment with care management team member scheduled for:6 months      Medication Assistance: None required.  Patient affirms current coverage meets needs.  Compliance/Adherence/Medication fill history: Care Gaps: Shingrix, COVID booster Last BP - 110/62 08/25/21 Last A1C - 6.3 on 03/17/2021  Star-Rating Drugs: Atorvastatin 10 mg last filled 08/07/21 90 DS at [facility] Losartan 50 mg last filled 04/12/21 [facility]  Patient's preferred pharmacy is:  [place], [place] [address] Phone: [phone] Fax: [phone]  [facility] [address] [place], [place]. [place] [address] Phone: [phone] Fax: [phone]  [facility] [place], [place] [facility] [employer] [address] [place] [address] Phone:  [phone] Fax: [phone]  Uses pill box? Yes - weekly and uses timer Pt endorses 100% compliance  We discussed: Current pharmacy is preferred with insurance plan and patient is satisfied with pharmacy services Patient decided to: Continue current medication management strategy  Care Plan and Follow Up Patient Decision:  Patient agrees to Care Plan and Follow-up.  Plan: Telephone follow up appointment with care management team member scheduled for:  6 months  [name], PharmD, [place] Pharmacist [place] at [place] [phone]

## 2021-09-06 NOTE — Patient Instructions (Signed)
Hi Augustin,  It was great to catch up with you again! As we discussed for your cholesterol, I think it would really help to try to add back in some regular exercise. Even if it's just 15-20 minutes per day, this can go a long way for your heart heath. Also, I think cutting back some on the sweets and carbs would probably help improve your fatigue along with your triglycerides. I did attach some more information about foods that can affect these so hopefully that is useful as well.  Please reach out to me if you have any questions or need anything before our follow up!  Best, Maddie  [name], PharmD, [facility]   Visit Information   Goals Addressed   None    Patient Care Plan: CCM Pharmacy Care Plan     Problem Identified: Problem: Hypertension, Hyperlipidemia, Atrial Fibrillation, GERD, BPH, Allergic Rhinitis, and SOB, ED, muscle spasms, HSV prophylaxis      Long-Range Goal: Patient-Specific Goal   Start Date: 09/06/2020  Expected End Date: 09/06/2021  Recent Progress: On track  Priority: High  Note:   Current Barriers:  Unable to independently monitor therapeutic efficacy  Pharmacist Clinical Goal(s):  Patient will achieve adherence to monitoring guidelines and medication adherence to achieve therapeutic efficacy through collaboration with PharmD and provider.   Interventions: 1:1 collaboration with Laurey Morale, MD regarding development and update of comprehensive plan of care as evidenced by provider attestation and co-signature Inter-disciplinary care team collaboration (see longitudinal plan of care) Comprehensive medication review performed; medication list updated in electronic medical record  Hypertension (BP goal <130/80) -Not ideally controlled -Current treatment: Amlodipine 10 mg 1 tablet daily - in AM - Appropriate, Effective, Safe, Accessible Losartan 50 mg 1 tablet daily - in AM  -  Appropriate, Effective, Safe, Accessible Metoprolol tartrate 25 mg 1/2 tablet twice daily - Appropriate, Effective, Safe, Accessible -Medications previously tried: none  -Current home readings: 138/78 (usually 130s/70s) -Current dietary habits: did not discuss -Current exercise habits: did not discuss -Denies hypotensive/hypertensive symptoms -Educated on BP goals and benefits of medications for prevention of heart attack, stroke and kidney damage; Importance of home blood pressure monitoring; Proper BP monitoring technique; Symptoms of hypotension and importance of maintaining adequate hydration; -Counseled to monitor BP at home weekly, document, and provide log at future appointments -Counseled on diet and exercise extensively Recommended to continue current medication  Hyperlipidemia: (LDL goal < 70) -Not ideally controlled -Current treatment: Atorvastatin 20 mg 1 tablet at bedtime - Appropriate, Query effective, Safe, Accessible -Medications previously tried: simvastatin  -Current dietary patterns: did not discuss -Current exercise habits: did not discuss -Educated on Cholesterol goals;  Benefits of statin for ASCVD risk reduction; Importance of limiting foods high in cholesterol; -Counseled on diet and exercise extensively Recommended to continue current medication Counseled on benefits of exercise and limiting carb/sweets intake for triglyceride lowering.  Pre-diabetes (A1c goal <6.5%) -Controlled -Current medications: No medications -Medications previously tried: none  -Current home glucose readings fasting glucose: does not need to check post prandial glucose: does not need to check -Denies hypoglycemic/hyperglycemic symptoms -Current meal patterns:  breakfast: did not discuss  lunch: did not discuss   dinner: did not discuss  snacks: did not discuss  drinks: did not discuss  -Current exercise: not routinely -Educated on A1c and blood sugar goals; Counseled to  check feet daily and get yearly eye exams -Counseled to check feet daily and get yearly eye exams -  Counseled on diet and exercise extensively   Atrial Fibrillation (Goal: prevent stroke and major bleeding) -Controlled -CHADSVASC: 4 -Current treatment: Rate control: metoprolol tartrate 25 mg 1/2 tablet twice daily; diltiazem 30 mg 1 tablet as needed for HR > 100 - Appropriate, Effective, Safe, Accessible Anticoagulation: Eliquis 5 mg 1 tablet twice daily - Appropriate, Effective, Safe, Accessible -Medications previously tried: carvedilol (fatigue, bradycardia) -Home BP and HR readings: 55-68; , refer to above -Counseled on avoidance of NSAIDs due to increased bleeding risk with anticoagulants; -Recommended to continue current medication  GERD (Goal: minimize symptoms) -Controlled -Current treatment  Famotidine 20 mg 1 tablet as needed - Appropriate, Effective, Safe, Accessible -Medications previously tried: none  -Recommended to continue current medication  Shortness of breath (Goal: improve breathing) -Controlled -Current treatment  Albuterol HFA inhale 2 puffs every 6 hours as needed (not used in several years) - Appropriate, Effective, Safe, Accessible -Medications previously tried: none  -Recommended to continue current medication  ED (Goal: minimize symptoms) -Controlled -Current treatment  Sildenafil 50 mg 1 tablet as needed - Appropriate, Effective, Safe, Accessible -Medications previously tried: none  -Recommended to continue current medication  Muscle spasms (Goal: minimize symptoms) -Controlled -Current treatment  Metaxalone 800 mg 1 tablet twice daily as needed -Medications previously tried: none  -Recommended to continue current medication  HSV prophylaxis (Goal: prevent HSV infections) -Controlled -Current treatment  Acyclovir 200 mg 1 capsule every other day - Appropriate, Effective, Safe, Accessible -Medications previously tried: none  -Recommended to  continue current medication  Insomnia (Goal: improve quality and quantity of sleep) -Not ideally controlled -Current treatment  Tylenol PM at bedtime as needed - Query Appropriate, Query effective, Query Safe, Accessible -Medications previously tried: melatonin (ineffective)  -Recommended discussion with PCP for safer alternative.   Health Maintenance -Vaccine gaps: shingrix, COVID booster -Current therapy:  None -Educated on Cost vs benefit of each product must be carefully weighed by individual consumer -Patient is satisfied with current therapy and denies issues -Recommended to continue as is  Patient Goals/Self-Care Activities Patient will:  - take medications as prescribed check blood pressure weekly, document, and provide at future appointments target a minimum of 150 minutes of moderate intensity exercise weekly  Follow Up Plan: Telephone follow up appointment with care management team member scheduled for:6 months       Patient verbalizes understanding of instructions and care plan provided today and agrees to view in [place]. Active MyChart status and patient understanding of how to access instructions and care plan via MyChart confirmed with patient.    Telephone follow up appointment with pharmacy team member scheduled for: 6 months  Viona Gilmore, [facility]

## 2021-09-14 DIAGNOSIS — N5201 Erectile dysfunction due to arterial insufficiency: Secondary | ICD-10-CM | POA: Diagnosis not present

## 2021-10-02 DIAGNOSIS — E785 Hyperlipidemia, unspecified: Secondary | ICD-10-CM | POA: Diagnosis not present

## 2021-10-02 DIAGNOSIS — I10 Essential (primary) hypertension: Secondary | ICD-10-CM | POA: Diagnosis not present

## 2021-10-23 ENCOUNTER — Ambulatory Visit (INDEPENDENT_AMBULATORY_CARE_PROVIDER_SITE_OTHER): Payer: Medicare Other | Admitting: Family Medicine

## 2021-10-23 ENCOUNTER — Encounter: Payer: Self-pay | Admitting: Family Medicine

## 2021-10-23 VITALS — BP 128/76 | HR 59 | Temp 98.6°F | Wt 180.1 lb

## 2021-10-23 DIAGNOSIS — M546 Pain in thoracic spine: Secondary | ICD-10-CM

## 2021-10-23 DIAGNOSIS — I6523 Occlusion and stenosis of bilateral carotid arteries: Secondary | ICD-10-CM

## 2021-10-23 MED ORDER — METHYLPREDNISOLONE 4 MG PO TBPK
ORAL_TABLET | ORAL | 0 refills | Status: DC
Start: 2021-10-23 — End: 2021-11-15

## 2021-10-23 NOTE — Progress Notes (Signed)
   Subjective:    Patient ID: Luke [name], male    DOB: 1946/02/02, 76 y.o.   MRN: [id number]  HPI Here for 10 days of a sharp pain over the right shoulder blade that radiates to the shoulder. No neck or back pain. No recent trauma. Heat and Tylenol help a little.    Review of Systems  Constitutional: Negative.   Respiratory: Negative.    Cardiovascular: Negative.   Musculoskeletal:  Positive for myalgias.       Objective:   Physical Exam Constitutional:      General: He is not in acute distress.    Appearance: Normal appearance.  Cardiovascular:     Rate and Rhythm: Normal rate and regular rhythm.     Pulses: Normal pulses.     Heart sounds: Normal heart sounds.  Pulmonary:     Effort: Pulmonary effort is normal.     Breath sounds: Normal breath sounds.  Musculoskeletal:     Comments: He is very tender over the right upper back at the lower tip of the scapula. The right shoulder is normal with no tenderness and full ROM  Neurological:     Mental Status: He is alert.           Assessment & Plan:  Thoracic back pain. He will continue heat. We will add Metaxolone and a Medrol dose pack. Recheck as needed.  [name], MD

## 2021-11-07 DIAGNOSIS — H903 Sensorineural hearing loss, bilateral: Secondary | ICD-10-CM | POA: Diagnosis not present

## 2021-11-07 DIAGNOSIS — H9313 Tinnitus, bilateral: Secondary | ICD-10-CM | POA: Diagnosis not present

## 2021-11-15 ENCOUNTER — Encounter: Payer: Self-pay | Admitting: Family Medicine

## 2021-11-15 ENCOUNTER — Ambulatory Visit (INDEPENDENT_AMBULATORY_CARE_PROVIDER_SITE_OTHER): Payer: Medicare Other | Admitting: Family Medicine

## 2021-11-15 ENCOUNTER — Ambulatory Visit (INDEPENDENT_AMBULATORY_CARE_PROVIDER_SITE_OTHER): Payer: Medicare Other

## 2021-11-15 VITALS — BP 120/76 | HR 54 | Temp 98.1°F | Wt 181.0 lb

## 2021-11-15 DIAGNOSIS — M5136 Other intervertebral disc degeneration, lumbar region: Secondary | ICD-10-CM | POA: Diagnosis not present

## 2021-11-15 DIAGNOSIS — M546 Pain in thoracic spine: Secondary | ICD-10-CM

## 2021-11-15 DIAGNOSIS — M545 Low back pain, unspecified: Secondary | ICD-10-CM | POA: Diagnosis not present

## 2021-11-15 DIAGNOSIS — M47816 Spondylosis without myelopathy or radiculopathy, lumbar region: Secondary | ICD-10-CM | POA: Diagnosis not present

## 2021-11-15 DIAGNOSIS — I6523 Occlusion and stenosis of bilateral carotid arteries: Secondary | ICD-10-CM

## 2021-11-15 DIAGNOSIS — M47814 Spondylosis without myelopathy or radiculopathy, thoracic region: Secondary | ICD-10-CM | POA: Diagnosis not present

## 2021-11-15 NOTE — Progress Notes (Signed)
   Subjective:    Patient ID: Luke [name], male    DOB: March 06, 1945, 76 y.o.   MRN: [id number]  HPI Here for a recurrence of right sided back pain. We saw him on 10-23-21 for right sided thoracic pain, and took Metaxalone and applied heat. We gave him a Medrol dose pack as well, but he took it because he began to feel better almost immediately. Then several days ago he developed a similar pain further down the right side of his back. No pain in the buttocks or legs.    Review of Systems  Constitutional: Negative.   Respiratory: Negative.    Cardiovascular: Negative.   Musculoskeletal:  Positive for back pain.       Objective:   Physical Exam Constitutional:      Comments: In mild pain, walks with his trunk leaning slightly to the right   Cardiovascular:     Rate and Rhythm: Normal rate and regular rhythm.     Pulses: Normal pulses.     Heart sounds: Normal heart sounds.  Pulmonary:     Effort: Pulmonary effort is normal.     Breath sounds: Normal breath sounds.  Musculoskeletal:     Comments: He is tender along the right side of the lower back with some spasm. ROM is full. SLR are negative.   Neurological:     Mental Status: He is alert.           Assessment & Plan:  Right sided thoracic and lower back pains. He will continue Metaxolone and heat. I advised him to go ahead and take the Medrol dose pack. We will send him for Xrays of the thoracic and lumbar spines today.  [name], MD

## 2021-11-27 DIAGNOSIS — E118 Type 2 diabetes mellitus with unspecified complications: Secondary | ICD-10-CM | POA: Insufficient documentation

## 2021-11-27 NOTE — Progress Notes (Unsigned)
Cardiology Office Note    Date:  11/28/2021   ID:  [name], [name] 1945/12/26, MRN [id number]  PCP:  Laurey Morale, MD  Cardiologist:   Minus Breeding, MD   Chief Complaint  Patient presents with   Atrial Fibrillation      History of Present Illness: Luke [name] is a 76 y.o. male who presents for follow up of atrial fib.    He was found to have atrial fib in the context of a URI.  He had a negative ischemia work up and unremarkable echo  He did not want to continue with the [place].   He was in the hospital with a CVA.   This appeared to be embolic. He was subsequently put on Eliquis.  He was in the emergency room in August 2022 with atrial fibrillation.  He left without being seen.   While he was waiting in the emergency room he went back into sinus rhythm .  He had another episode of atrial fibrillation in October 2022.     He presents for follow up.  Since I last saw him he was in the ED with PAF in August.   He spontaneously converted to NSR.  Since I last saw him he had about 14 hours of atrial fib in April.  This came and went spontaneously.  He is otherwise done okay. The patient denies any new symptoms such as chest discomfort, neck or arm discomfort. There has been no new shortness of breath, PND or orthopnea. There have been no reported palpitations, presyncope or syncope.    Past Medical History:  Diagnosis Date   Asthma    Depression    ED (erectile dysfunction)    Hyperlipidemia    Hypertension    Paroxysmal atrial fibrillation ([place]) 09/2016   Initial diagnosis was in setting of acute URI/asthma attack   Solitary pulmonary nodule 04/16/2013   03/30/13  CXR Question left nipple shadow.  10 mm ovoid nodular density right upper lobe, cannot exclude  pulmonary mass/ nodule > f/u cxr 04/29/2013 > no nodule     Stroke [facility])    Ulcer     Past Surgical History:  Procedure Laterality Date   COLONOSCOPY  05/15/2019   per Dr. Wilford Corner, clear, no  repeats needed    NM MYOVIEW LTD  10/2016   ailable, images/films reviewed: From Epic Chart or Care Everywhere)   TRANSTHORACIC ECHOCARDIOGRAM  41/7408   Normal systolic function. EF 5560%. No RWMA. Gr 1 DD. Mild AoV calcification - no stenosis. Atrial Septum - lipomatous hypertrophy. --Essentially normal.     Current Outpatient Medications  Medication Sig Dispense Refill   albuterol (VENTOLIN HFA) 108 (90 Base) MCG/ACT inhaler Inhale 2 puffs into the lungs every 6 hrs as needed for Wheezing or SOB 18 g 3   amLODipine (NORVASC) 10 MG tablet Take 1 tablet (10 mg total) by mouth daily. 90 tablet 3   apixaban (ELIQUIS) 5 MG TABS tablet Take 1 tablet (5 mg total) by mouth 2 (two) times daily. 60 tablet 0   atorvastatin (LIPITOR) 20 MG tablet Take 1 tablet (20 mg total) by mouth daily. 90 tablet 3   diltiazem (CARDIZEM) 30 MG tablet Take one tablet as needed for heart rates greater than 100 30 tablet 0   famotidine (PEPCID) 20 MG tablet TAKE ONE TABLET BY MOUTH  PRN     losartan (COZAAR) 50 MG tablet Take 1 tablet (50 mg total)  by mouth daily. 90 tablet 3   meclizine (ANTIVERT) 25 MG tablet Take 1 tablet (25 mg total) by mouth every 6 (six) hours as needed for dizziness. 60 tablet 2   metoprolol tartrate (LOPRESSOR) 25 MG tablet Take 0.5 tablets (12.5 mg total) by mouth 2 (two) times daily. 90 tablet 3   sildenafil (VIAGRA) 50 MG tablet Take 1 tablet (50 mg total) by mouth as needed. 30 tablet 3   No current facility-administered medications for this visit.    Allergies:   Aspirin, Hydrochlorothiazide, Irbesartan, and Symbicort [budesonide-formoterol fumarate]    ROS:  Please see the history of present illness.   Otherwise, review of systems are positive for muscle cramps.   All other systems are reviewed and negative.    PHYSICAL EXAM: VS:  BP 124/64   Pulse (!) 58   Ht '5\' 7"'$  (1.702 m)   Wt 183 lb 12.8 oz (83.4 kg)   SpO2 98%   BMI 28.79 kg/m  , BMI Body mass index is 28.79  kg/m. GENERAL:  Well appearing NECK:  No jugular venous distention, waveform within normal limits, carotid upstroke brisk and symmetric, no bruits, no thyromegaly LUNGS:  Clear to auscultation bilaterally CHEST:  Unremarkable HEART:  PMI not displaced or sustained,S1 and S2 within normal limits, no S3, no S4, no clicks, no rubs, no murmurs ABD:  Flat, positive bowel sounds normal in frequency in pitch, no bruits, no rebound, no guarding, no midline pulsatile mass, no hepatomegaly, no splenomegaly EXT:  2 plus pulses throughout, no edema, no cyanosis no clubbing  EKG:  NA   Recent Labs: 03/17/2021: BUN 18; Creatinine, Ser 1.08; Hemoglobin 14.4; Platelets 332.0; Potassium 4.0; Sodium 141; TSH 1.55 08/02/2021: ALT 13    Lipid Panel    Component Value Date/Time   CHOL 139 08/02/2021 1027   TRIG 188 (H) 08/02/2021 1027   HDL 31 (L) 08/02/2021 1027   CHOLHDL 4.5 08/02/2021 1027   CHOLHDL 5 03/17/2021 1049   VLDL 39.2 03/17/2021 1049   LDLCALC 76 08/02/2021 1027   LDLDIRECT 73.0 02/08/2015 0906      Wt Readings from Last 3 Encounters:  11/28/21 183 lb 12.8 oz (83.4 kg)  11/15/21 181 lb (82.1 kg)  10/23/21 180 lb 2 oz (81.7 kg)      Other studies Reviewed: Additional studies/ records that were reviewed today include: None Review of the above records demonstrates: See elsewhere  ASSESSMENT AND PLAN:  ATRIAL FIB:  Luke [name] has a CHA2DS2 - VASc score of  4.   He has had rare paroxysms.  No change in therapy.  He will take as needed beta-blocker and we discussed this.  He will let me know if he has increased symptoms.   HTN: The blood pressure is at target.  No change in therapy.   CAROTID DOPPLER: 60 to 79% right carotid stenosis (60% on angiogram) and 40 to 59% left stenosis.  He is followed at the [place].    DYSLIPDEMIA:   LDL was 76.  HDL 31.  No change in therapy.  ELEVATED A1C: This was 6.3.  No change in therapy.   Current medicines are reviewed at length  with the patient today.  The patient does not have concerns regarding medicines.  The following changes have been made:  None  Labs/ tests ordered today include:  None  No orders of the defined types were placed in this encounter.     Disposition:   FU with me in  12 months.   Signed, Minus Breeding, MD  11/28/2021 1:45 PM    [place]

## 2021-11-28 ENCOUNTER — Encounter: Payer: Self-pay | Admitting: Cardiology

## 2021-11-28 ENCOUNTER — Ambulatory Visit: Payer: Medicare Other | Attending: Cardiology | Admitting: Cardiology

## 2021-11-28 VITALS — BP 124/64 | HR 58 | Ht 67.0 in | Wt 183.8 lb

## 2021-11-28 DIAGNOSIS — I480 Paroxysmal atrial fibrillation: Secondary | ICD-10-CM | POA: Diagnosis not present

## 2021-11-28 DIAGNOSIS — I6523 Occlusion and stenosis of bilateral carotid arteries: Secondary | ICD-10-CM | POA: Insufficient documentation

## 2021-11-28 DIAGNOSIS — E118 Type 2 diabetes mellitus with unspecified complications: Secondary | ICD-10-CM | POA: Insufficient documentation

## 2021-11-28 DIAGNOSIS — E785 Hyperlipidemia, unspecified: Secondary | ICD-10-CM | POA: Insufficient documentation

## 2021-11-28 NOTE — Patient Instructions (Signed)
   Follow-Up: At [facility], you and your health needs are our priority.  As part of our continuing mission to provide you with exceptional heart care, we have created designated Provider Care Teams.  These Care Teams include your primary Cardiologist (physician) and Advanced Practice Providers (APPs -  Physician Assistants and Nurse Practitioners) who all work together to provide you with the care you need, when you need it.  We recommend signing up for the patient portal called "MyChart".  Sign up information is provided on this After Visit Summary.  MyChart is used to connect with patients for Virtual Visits (Telemedicine).  Patients are able to view lab/test results, encounter notes, upcoming appointments, etc.  Non-urgent messages can be sent to your provider as well.   To learn more about what you can do with MyChart, go to [email or web address].    Your next appointment:   12 month(s)  The format for your next appointment:   In Person  Provider:   James Hochrein, MD     

## 2021-11-29 ENCOUNTER — Ambulatory Visit (INDEPENDENT_AMBULATORY_CARE_PROVIDER_SITE_OTHER): Payer: Medicare Other

## 2021-11-29 DIAGNOSIS — Z23 Encounter for immunization: Secondary | ICD-10-CM

## 2021-12-21 ENCOUNTER — Telehealth: Payer: Self-pay | Admitting: Pharmacist

## 2021-12-21 NOTE — Chronic Care Management (AMB) (Signed)
Chronic Care Management Pharmacy Assistant   Name: Luke [name]  MRN: [id number] DOB: Apr 21, 1945  Reason for Encounter: Disease State / Hypertension Assessment Call   Conditions to be addressed/monitored: HTN  Recent office visits:  11/15/2021 [name] MD - Patient was seen for right lumbar pain and additional concerns. Discontinued Medrol Dosepak. No follow up noted.   10/23/2021 [name] MD - Patient was seen for Acute right-sided thoracic back pain. Started Metaxolone and Medrol dosepak. No follow up noted.  Recent consult visits:  11/28/2021 Minus Breeding MD (cardiology) - Patient was seen for PAF and additional concerns. Discontinued Diltiazem. Follow up in 12 months.   11/07/2021 [name] PA-C (ENT) - Patient was seen for Sensorineural hearing loss (SNHL) of both ears and Tnnitus of both ears.No medication changes. Follow up in 1 year.   Hospital visits:  None  Medications: Outpatient Encounter Medications as of 12/21/2021  Medication Sig Note   albuterol (VENTOLIN HFA) 108 (90 Base) MCG/ACT inhaler Inhale 2 puffs into the lungs every 6 hrs as needed for Wheezing or SOB    amLODipine (NORVASC) 10 MG tablet Take 1 tablet (10 mg total) by mouth daily.    apixaban (ELIQUIS) 5 MG TABS tablet Take 1 tablet (5 mg total) by mouth 2 (two) times daily.    atorvastatin (LIPITOR) 20 MG tablet Take 1 tablet (20 mg total) by mouth daily.    famotidine (PEPCID) 20 MG tablet TAKE ONE TABLET BY MOUTH  PRN    losartan (COZAAR) 50 MG tablet Take 1 tablet (50 mg total) by mouth daily.    meclizine (ANTIVERT) 25 MG tablet Take 1 tablet (25 mg total) by mouth every 6 (six) hours as needed for dizziness.    metoprolol tartrate (LOPRESSOR) 25 MG tablet Take 0.5 tablets (12.5 mg total) by mouth 2 (two) times daily.    sildenafil (VIAGRA) 50 MG tablet Take 1 tablet (50 mg total) by mouth as needed. 10/11/2020: As needed   No facility-administered encounter medications on file as of  12/21/2021.  Fill History: albuterol sulfate HFA 90 mcg/actuation aerosol inhaler 05/26/2021 30   AMLODIPINE '10MG'$  TAB 11/26/2021 90   ELIQUIS      TAB '5MG'$  11/27/2021 90   atorvastatin 20 mg tablet 11/07/2021 90   METOPROL TAR '25MG'$  TAB 11/27/2021 90   Reviewed chart prior to disease state call. Spoke with patient regarding BP  Recent Office Vitals: BP Readings from Last 3 Encounters:  11/28/21 124/64  11/15/21 120/76  10/23/21 128/76   Pulse Readings from Last 3 Encounters:  11/28/21 (!) 58  11/15/21 (!) 54  10/23/21 (!) 59    Wt Readings from Last 3 Encounters:  11/28/21 183 lb 12.8 oz (83.4 kg)  11/15/21 181 lb (82.1 kg)  10/23/21 180 lb 2 oz (81.7 kg)     Kidney Function Lab Results  Component Value Date/Time   CREATININE 1.08 03/17/2021 10:49 AM   CREATININE 1.01 10/11/2020 12:33 PM   GFR 67.37 03/17/2021 10:49 AM   GFRNONAA >60 10/11/2020 12:33 PM   GFRAA >60 04/19/2018 03:24 PM       Latest Ref Rng & Units 03/17/2021   10:49 AM 10/11/2020   12:33 PM 03/15/2020   11:22 AM  BMP  Glucose 70 - 99 mg/dL 99  125  102   BUN 6 - 23 mg/dL '18  16  16   '$ Creatinine 0.40 - 1.50 mg/dL 1.08  1.01  0.99   Sodium 135 - 145 mEq/L  141  140  140   Potassium 3.5 - 5.1 mEq/L 4.0  4.1  4.1   Chloride 96 - 112 mEq/L 104  106  107   CO2 19 - 32 mEq/L '28  25  26   '$ Calcium 8.4 - 10.5 mg/dL 9.0  8.6  8.8     Current antihypertensive regimen:  Amlodipine 10 mg daily Losartan 50 mg daily  How often are you checking your Blood Pressure? Patient is checking blood pressures 1-2 times per month  Current home BP readings: Patient doesn't keep a log, he states his readings are always around 135/70.   What recent interventions/DTPs have been made by any provider to improve Blood Pressure control since last CPP Visit: Diltiazem discontinued by cardiology on 11/28/2021.  Any recent hospitalizations or ED visits since last visit with CPP? No recent hospital visits.   What diet changes  have been made to improve Blood Pressure Control?  Patient follows no specific way of eating Breakfast - patient will have toast or eggs Lunch - patient will have a sandwich of some sort Dinner - patient will have a meal containing a meat, vegetable and starch.  What exercise is being done to improve your Blood Pressure Control?  Patient stays active all day, nothing specified  Adherence Review: Is the patient currently on ACE/ARB medication? Yes Does the patient have >5 day gap between last estimated fill dates? No  Care Gaps: AWV - completed 12/28/2020 Last BP - 124/64 on 11/28/2021 Last A1C - 6.3 on 03/17/2021 Foot exam - never done Eye exam - never done Shingrix - never done Urine ACR - overdue Covid - overdue HGA1C - overdue  Star Rating Drugs: Atorvastatin  20 mg - last filled 11/07/2021 90 DS at [facility] Losartan 50 mg - last filled 04/12/2021 90 DS at [place], verified with [place] Pharmacist Assistant [phone]

## 2021-12-29 ENCOUNTER — Ambulatory Visit (INDEPENDENT_AMBULATORY_CARE_PROVIDER_SITE_OTHER): Payer: Medicare Other

## 2021-12-29 VITALS — Ht 67.0 in | Wt 180.0 lb

## 2021-12-29 DIAGNOSIS — Z0000 Encounter for general adult medical examination without abnormal findings: Secondary | ICD-10-CM | POA: Diagnosis not present

## 2021-12-29 NOTE — Patient Instructions (Addendum)
Luke [name] , Thank you for taking time to come for your Medicare Wellness Visit. I appreciate your ongoing commitment to your health goals. Please review the following plan we discussed and let me know if I can assist you in the future.   These are the goals we discussed:  Goals       Exercise 150 min/wk Moderate Activity      Weight (lb) < 180 lb (81.6 kg) (pt-stated)      Will try to lose 10 lbs and eat less sugar        This is a list of the screening recommended for you and due dates:  Health Maintenance  Topic Date Due   Eye exam for diabetics  Never done   Yearly kidney health urinalysis for diabetes  10/28/2014   Hemoglobin A1C  09/14/2021   Complete foot exam   12/30/2021*   COVID-19 Vaccine (5 - Pfizer series) 01/14/2022*   Zoster (Shingles) Vaccine (1 of 2) 03/31/2022*   Yearly kidney function blood test for diabetes  03/17/2022   Medicare Annual Wellness Visit  12/30/2022   Colon Cancer Screening  05/14/2024   Tetanus Vaccine  11/28/2026   Pneumonia Vaccine  Completed   Flu Shot  Completed   Hepatitis C Screening: USPSTF Recommendation to screen - Ages 18-79 yo.  Completed   HPV Vaccine  Aged Out  *Topic was postponed. The date shown is not the original due date.    Advanced directives: Please bring a copy of your health care power of attorney and living will to the office to be added to your chart at your convenience.   Conditions/risks identified: None  Next appointment: Follow up in one year for your annual wellness visit.    Preventive Care 17 Years and Older, Male  Preventive care refers to lifestyle choices and visits with your health care provider that can promote health and wellness. What does preventive care include? A yearly physical exam. This is also called an annual well check. Dental exams once or twice a year. Routine eye exams. Ask your health care provider how often you should have your eyes checked. Personal lifestyle choices,  including: Daily care of your teeth and gums. Regular physical activity. Eating a healthy diet. Avoiding tobacco and drug use. Limiting alcohol use. Practicing safe sex. Taking low doses of aspirin every day. Taking vitamin and mineral supplements as recommended by your health care provider. What happens during an annual well check? The services and screenings done by your health care provider during your annual well check will depend on your age, overall health, lifestyle risk factors, and family history of disease. Counseling  Your health care provider may ask you questions about your: Alcohol use. Tobacco use. Drug use. Emotional well-being. Home and relationship well-being. Sexual activity. Eating habits. History of falls. Memory and ability to understand (cognition). Work and work Statistician. Screening  You may have the following tests or measurements: Height, weight, and BMI. Blood pressure. Lipid and cholesterol levels. These may be checked every 5 years, or more frequently if you are over 84 years old. Skin check. Lung cancer screening. You may have this screening every year starting at age 64 if you have a 30-pack-year history of smoking and currently smoke or have quit within the past 15 years. Fecal occult blood test (FOBT) of the stool. You may have this test every year starting at age 31. Flexible sigmoidoscopy or colonoscopy. You may have a sigmoidoscopy every 5 years or a  colonoscopy every 10 years starting at age 61. Prostate cancer screening. Recommendations will vary depending on your family history and other risks. Hepatitis C blood test. Hepatitis B blood test. Sexually transmitted disease (STD) testing. Diabetes screening. This is done by checking your blood sugar (glucose) after you have not eaten for a while (fasting). You may have this done every 1-3 years. Abdominal aortic aneurysm (AAA) screening. You may need this if you are a current or former  smoker. Osteoporosis. You may be screened starting at age 61 if you are at high risk. Talk with your health care provider about your test results, treatment options, and if necessary, the need for more tests. Vaccines  Your health care provider may recommend certain vaccines, such as: Influenza vaccine. This is recommended every year. Tetanus, diphtheria, and acellular pertussis (Tdap, Td) vaccine. You may need a Td booster every 10 years. Zoster vaccine. You may need this after age 107. Pneumococcal 13-valent conjugate (PCV13) vaccine. One dose is recommended after age 58. Pneumococcal polysaccharide (PPSV23) vaccine. One dose is recommended after age 22. Talk to your health care provider about which screenings and vaccines you need and how often you need them. This information is not intended to replace advice given to you by your health care provider. Make sure you discuss any questions you have with your health care provider. Document Released: 03/18/2015 Document Revised: 11/09/2015 Document Reviewed: 12/21/2014 Elsevier Interactive Patient Education  2017 [place] Prevention in the Home Falls can cause injuries. They can happen to people of all ages. There are many things you can do to make your home safe and to help prevent falls. What can I do on the outside of my home? Regularly fix the edges of walkways and driveways and fix any cracks. Remove anything that might make you trip as you walk through a door, such as a raised step or threshold. Trim any bushes or trees on the path to your home. Use bright outdoor lighting. Clear any walking paths of anything that might make someone trip, such as rocks or tools. Regularly check to see if handrails are loose or broken. Make sure that both sides of any steps have handrails. Any raised decks and porches should have guardrails on the edges. Have any leaves, snow, or ice cleared regularly. Use sand or salt on walking paths during  winter. Clean up any spills in your garage right away. This includes oil or grease spills. What can I do in the bathroom? Use night lights. Install grab bars by the toilet and in the tub and shower. Do not use towel bars as grab bars. Use non-skid mats or decals in the tub or shower. If you need to sit down in the shower, use a plastic, non-slip stool. Keep the floor dry. Clean up any water that spills on the floor as soon as it happens. Remove soap buildup in the tub or shower regularly. Attach bath mats securely with double-sided non-slip rug tape. Do not have throw rugs and other things on the floor that can make you trip. What can I do in the bedroom? Use night lights. Make sure that you have a light by your bed that is easy to reach. Do not use any sheets or blankets that are too big for your bed. They should not hang down onto the floor. Have a firm chair that has side arms. You can use this for support while you get dressed. Do not have throw rugs and other things  on the floor that can make you trip. What can I do in the kitchen? Clean up any spills right away. Avoid walking on wet floors. Keep items that you use a lot in easy-to-reach places. If you need to reach something above you, use a strong step stool that has a grab bar. Keep electrical cords out of the way. Do not use floor polish or wax that makes floors slippery. If you must use wax, use non-skid floor wax. Do not have throw rugs and other things on the floor that can make you trip. What can I do with my stairs? Do not leave any items on the stairs. Make sure that there are handrails on both sides of the stairs and use them. Fix handrails that are broken or loose. Make sure that handrails are as long as the stairways. Check any carpeting to make sure that it is firmly attached to the stairs. Fix any carpet that is loose or worn. Avoid having throw rugs at the top or bottom of the stairs. If you do have throw rugs,  attach them to the floor with carpet tape. Make sure that you have a light switch at the top of the stairs and the bottom of the stairs. If you do not have them, ask someone to add them for you. What else can I do to help prevent falls? Wear shoes that: Do not have high heels. Have rubber bottoms. Are comfortable and fit you well. Are closed at the toe. Do not wear sandals. If you use a stepladder: Make sure that it is fully opened. Do not climb a closed stepladder. Make sure that both sides of the stepladder are locked into place. Ask someone to hold it for you, if possible. Clearly mark and make sure that you can see: Any grab bars or handrails. First and last steps. Where the edge of each step is. Use tools that help you move around (mobility aids) if they are needed. These include: Canes. Walkers. Scooters. Crutches. Turn on the lights when you go into a dark area. Replace any light bulbs as soon as they burn out. Set up your furniture so you have a clear path. Avoid moving your furniture around. If any of your floors are uneven, fix them. If there are any pets around you, be aware of where they are. Review your medicines with your doctor. Some medicines can make you feel dizzy. This can increase your chance of falling. Ask your doctor what other things that you can do to help prevent falls. This information is not intended to replace advice given to you by your health care provider. Make sure you discuss any questions you have with your health care provider. Document Released: 12/16/2008 Document Revised: 07/28/2015 Document Reviewed: 03/26/2014 Elsevier Interactive Patient Education  2017 [employer].

## 2021-12-29 NOTE — Progress Notes (Signed)
Subjective:   Luke [name] is a 76 y.o. male who presents for Medicare Annual/Subsequent preventive examination.  Review of Systems    Virtual Visit via Telephone Note  I connected with  Luke [name] on 12/29/21 at  2:00 PM EDT by telephone and verified that I am speaking with the correct person using two identifiers.  Location: Patient: Home Provider: Office Persons participating in the virtual visit: patient/Nurse Health Advisor   I discussed the limitations, risks, security and privacy concerns of performing an evaluation and management service by telephone and the availability of in person appointments. The patient expressed understanding and agreed to proceed.  Interactive audio and video telecommunications were attempted between this nurse and patient, however failed, due to patient having technical difficulties OR patient did not have access to video capability.  We continued and completed visit with audio only.  Some vital signs may be absent or patient reported.   Criselda Peaches, LPN  Cardiac Risk Factors include: advanced age (>14mn, >>34women);hypertension;male gender     Objective:    Today's Vitals   12/29/21 1359  Weight: 180 lb (81.6 kg)  Height: '5\' 7"'$  (1.702 m)   Body mass index is 28.19 kg/m.     12/29/2021    2:05 PM 12/28/2020   11:31 AM 10/11/2020   12:18 PM 01/25/2020    4:54 PM 12/23/2019    2:57 PM 04/20/2018    1:02 AM 09/25/2016    6:18 PM  Advanced Directives  Does Patient Have a Medical Advance Directive? Yes Yes No No No No No  Type of AParamed[place]of [place] will [place] will       Copy of [place]? No - copy requested No - copy requested       Would patient like information on creating a medical advance directive?    No - Patient declined No - Patient declined Yes (ED - Information included in AVS) No - Patient declined    Current  Medications (verified) Outpatient Encounter Medications as of 12/29/2021  Medication Sig   albuterol (VENTOLIN HFA) 108 (90 Base) MCG/ACT inhaler Inhale 2 puffs into the lungs every 6 hrs as needed for Wheezing or SOB   amLODipine (NORVASC) 10 MG tablet Take 1 tablet (10 mg total) by mouth daily.   apixaban (ELIQUIS) 5 MG TABS tablet Take 1 tablet (5 mg total) by mouth 2 (two) times daily.   atorvastatin (LIPITOR) 20 MG tablet Take 1 tablet (20 mg total) by mouth daily.   famotidine (PEPCID) 20 MG tablet TAKE ONE TABLET BY MOUTH  PRN   losartan (COZAAR) 50 MG tablet Take 1 tablet (50 mg total) by mouth daily.   meclizine (ANTIVERT) 25 MG tablet Take 1 tablet (25 mg total) by mouth every 6 (six) hours as needed for dizziness.   metoprolol tartrate (LOPRESSOR) 25 MG tablet Take 0.5 tablets (12.5 mg total) by mouth 2 (two) times daily.   sildenafil (VIAGRA) 50 MG tablet Take 1 tablet (50 mg total) by mouth as needed.   No facility-administered encounter medications on file as of 12/29/2021.    Allergies (verified) Aspirin, Hydrochlorothiazide, Irbesartan, and Symbicort [budesonide-formoterol fumarate]   History: Past Medical History:  Diagnosis Date   Asthma    Depression    ED (erectile dysfunction)    Hyperlipidemia    Hypertension    Paroxysmal atrial fibrillation ([place] 09/2016   Initial diagnosis was in setting of  acute URI/asthma attack   Solitary pulmonary nodule 04/16/2013   03/30/13  CXR Question left nipple shadow.  10 mm ovoid nodular density right upper lobe, cannot exclude  pulmonary mass/ nodule > f/u cxr 04/29/2013 > no nodule     Stroke [facility])    Ulcer    Past Surgical History:  Procedure Laterality Date   COLONOSCOPY  05/15/2019   per Dr. Wilford Corner, clear, no repeats needed    NM MYOVIEW LTD  10/2016   ailable, images/films reviewed: From Epic Chart or Care Everywhere)   TRANSTHORACIC ECHOCARDIOGRAM  35/0093   Normal systolic function. EF 5560%. No RWMA. Gr 1  DD. Mild AoV calcification - no stenosis. Atrial Septum - lipomatous hypertrophy. --Essentially normal.   Family History  Problem Relation Age of Onset   Stroke Mother    Hypertension Mother    Peripheral vascular disease Mother    Hyperlipidemia Father    Coronary artery disease Father    Peripheral vascular disease Father    Hypertension Brother    Coronary artery disease Brother    Cancer Brother        throat, smoked   Emphysema Brother        smoked   Lung cancer Brother        smoked   Social History   Socioeconomic History   Marital status: Married    Spouse name: Not on file   Number of children: Not on file   Years of education: Not on file   Highest education level: Not on file  Occupational History   Occupation: Retired Product manager: RETIRED  Tobacco Use   Smoking status: Never   Smokeless tobacco: Never  Vaping Use   Vaping Use: Never used  Substance and Sexual Activity   Alcohol use: Yes    Alcohol/week: 2.0 standard drinks of alcohol    Types: 2 Standard drinks or equivalent per week    Comment: socially occasionally    Drug use: No   Sexual activity: Not on file  Other Topics Concern   Not on file  Social History Narrative   Not on file   Social Determinants of Health   Financial Resource Strain: Low Risk  (12/29/2021)   Overall Financial Resource Strain (CARDIA)    Difficulty of Paying Living Expenses: Not hard at all  Food Insecurity: No Food Insecurity (12/29/2021)   Hunger Vital Sign    Worried About Running Out of Food in the Last Year: Never true    Ran Out of Food in the Last Year: Never true  Transportation Needs: No Transportation Needs (12/29/2021)   PRAPARE - Hydrologist (Medical): No    Lack of Transportation (Non-Medical): No  Physical Activity: Inactive (12/29/2021)   Exercise Vital Sign    Days of Exercise per Week: 0 days    Minutes of Exercise per Session: 0 min  Stress: No Stress  Concern Present (12/29/2021)   [place]    Feeling of Stress : Not at all  Social Connections: [place] (12/29/2021)   Social Connection and Isolation Panel [NHANES]    Frequency of Communication with Friends and Family: More than three times a week    Frequency of Social Gatherings with Friends and Family: More than three times a week    Attends Religious Services: More than 4 times per year    Active Member of [employer] or Organizations: Yes  Attends Music therapist: More than 4 times per year    Marital Status: Married    Tobacco Counseling Counseling given: Not Answered   Clinical Intake:  Pre-visit preparation completed: No  Pain : No/denies pain     BMI - recorded: 28.19 Nutritional Status: BMI 25 -29 Overweight Nutritional Risks: None Diabetes: No  How often do you need to have someone help you when you read instructions, pamphlets, or other written materials from your doctor or pharmacy?: 1 - Never  Diabetic?  No  Interpreter Needed?: No  Information entered by :: [name] LPN   Activities of Daily Living    12/29/2021    2:03 PM  In your present state of health, do you have any difficulty performing the following activities:  Hearing? 0  Vision? 0  Difficulty concentrating or making decisions? 0  Walking or climbing stairs? 0  Dressing or bathing? 0  Doing errands, shopping? 0  Preparing Food and eating ? N  Using the Toilet? N  In the past six months, have you accidently leaked urine? N  Do you have problems with loss of bowel control? N  Managing your Medications? N  Managing your Finances? N  Housekeeping or managing your Housekeeping? N    Patient Care Team: Laurey Morale, MD as PCP - General (Family Medicine) Minus Breeding, MD as PCP - Cardiology (Cardiology) Viona Gilmore, [facility] as Pharmacist (Pharmacist)  Indicate any recent Medical  Services you may have received from other than [facility] providers in the past year (date may be approximate).     Assessment:   This is a routine wellness examination for Luke [name].  Hearing/Vision screen Hearing Screening - Comments:: Denies hearing difficulties   Vision Screening - Comments:: Wears reading glasses - up to date with routine eye exams with  Dr Laban Emperor  Dietary issues and exercise activities discussed: Current Exercise Habits: The patient does not participate in regular exercise at present, Exercise limited by: None identified   Goals Addressed               This Visit's Progress     Weight (lb) < 180 lb (81.6 kg) (pt-stated)   180 lb (81.6 kg)     Will try to lose 10 lbs and eat less sugar       Depression Screen    12/29/2021    2:02 PM 11/15/2021   11:14 AM 10/23/2021    4:21 PM 03/17/2021   11:44 AM 12/28/2020   11:32 AM 12/28/2020   11:29 AM 12/23/2019    3:01 PM  PHQ 2/9 Scores  PHQ - 2 Score 0 0 0 0 0 0 0  PHQ- 9 Score 0 0 2 4   0    Fall Risk    12/29/2021    2:04 PM 11/15/2021   11:14 AM 10/23/2021    4:19 PM 03/17/2021   11:44 AM 12/28/2020   11:31 AM  [place] in the past year? 0 0 0 0 0  Number falls in past yr: 0 0 0 0 0  Injury with Fall? 0 0 0 0 0  Risk for fall due to : No Fall Risks No Fall Risks No Fall Risks No Fall Risks No Fall Risks  Follow up Falls prevention discussed Falls evaluation completed Falls evaluation completed  Falls evaluation completed    FALL RISK PREVENTION PERTAINING TO THE HOME:  Any stairs in or around the home? Yes  If so,  are there any without handrails? No  Home free of loose throw rugs in walkways, pet beds, electrical cords, etc? Yes  Adequate lighting in your home to reduce risk of falls? Yes   ASSISTIVE DEVICES UTILIZED TO PREVENT FALLS:  Life alert? No  Use of a cane, walker or w/c? No  Grab bars in the bathroom? Yes  Shower chair or bench in shower? No  Elevated toilet seat or a  handicapped toilet? Yes   TIMED UP AND GO:  Was the test performed? No . Audio Visit   Cognitive Function:        12/29/2021    2:05 PM  6CIT Screen  What Year? 0 points  What month? 0 points  What time? 0 points  Count back from 20 0 points  Months in reverse 0 points  Repeat phrase 0 points  Total Score 0 points    Immunizations Immunization History  Administered Date(s) Administered   Fluad Quad(high Dose 65+) 11/11/2018, 11/28/2020, 11/29/2021   Influenza Split 12/18/2010, 01/04/2012   Influenza Whole 12/20/2009   Influenza, High Dose Seasonal PF 12/17/2012, 12/13/2015, 11/26/2016, 12/06/2017, 12/18/2019   Influenza,inj,Quad PF,6+ Mos 11/12/2013, 01/06/2015   PFIZER(Purple Top)SARS-COV-2 Vaccination 03/25/2019, 04/15/2019, 12/02/2019   Pneumococcal Conjugate-13 11/12/2013   Pneumococcal Polysaccharide-23 06/13/2011   Td 04/23/2003   Tdap 11/27/2016   Zoster, Live 01/16/2013    TDAP status: Up to date  Flu Vaccine status: Up to date  Pneumococcal vaccine status: Up to date  Covid-19 vaccine status: Completed vaccines  Qualifies for Shingles Vaccine? Yes   Zostavax completed No   Shingrix Completed?: No.    Education has been provided regarding the importance of this vaccine. Patient has been advised to call insurance company to determine out of pocket expense if they have not yet received this vaccine. Advised may also receive vaccine at local pharmacy or Health Dept. Verbalized acceptance and understanding.  Screening Tests Health Maintenance  Topic Date Due   OPHTHALMOLOGY EXAM  Never done   Diabetic kidney evaluation - Urine ACR  10/28/2014   HEMOGLOBIN A1C  09/14/2021   FOOT EXAM  12/30/2021 (Originally 03/15/1955)   COVID-19 Vaccine (5 - Pfizer series) 01/14/2022 (Originally 05/11/2021)   Zoster Vaccines- Shingrix (1 of 2) 03/31/2022 (Originally 03/15/1996)   Diabetic kidney evaluation - GFR measurement  03/17/2022   Medicare Annual Wellness (AWV)   12/30/2022   COLONOSCOPY (Pts 45-38yr Insurance coverage will need to be confirmed)  05/14/2024   TETANUS/TDAP  11/28/2026   Pneumonia Vaccine 75 Years old  Completed   INFLUENZA VACCINE  Completed   Hepatitis C Screening  Completed   HPV VACCINES  Aged Out    Health Maintenance  Health Maintenance Due  Topic Date Due   OPHTHALMOLOGY EXAM  Never done   Diabetic kidney evaluation - Urine ACR  10/28/2014   HEMOGLOBIN A1C  09/14/2021    Colorectal cancer screening: Type of screening: Colonoscopy. Completed 05/15/19. Repeat every 5 years  Lung Cancer Screening: (Low Dose CT Chest recommended if Age 75-80years, 30 pack-year currently smoking OR have quit w/in 15years.) does not qualify.     Additional Screening:  Hepatitis C Screening: does qualify; Completed 02/17/16  Vision Screening: Recommended annual ophthalmology exams for early detection of glaucoma and other disorders of the eye. Is the patient up to date with their annual eye exam?  Yes  Who is the provider or what is the name of the office in which the patient attends annual eye exams? Dr HLaban Emperor  If pt is not established with a provider, would they like to be referred to a provider to establish care? No .   Dental Screening: Recommended annual dental exams for proper oral hygiene  Community Resource Referral / Chronic Care Management:  CRR required this visit?  No   CCM required this visit?  No      Plan:     I have personally reviewed and noted the following in the patient's chart:   Medical and social history Use of alcohol, tobacco or illicit drugs  Current medications and supplements including opioid prescriptions. Patient is not currently taking opioid prescriptions. Functional ability and status Nutritional status Physical activity Advanced directives List of other physicians Hospitalizations, surgeries, and ER visits in previous 12 months Vitals Screenings to include cognitive, depression, and  falls Referrals and appointments  In addition, I have reviewed and discussed with patient certain preventive protocols, quality metrics, and best practice recommendations. A written personalized care plan for preventive services as well as general preventive health recommendations were provided to patient.     Criselda Peaches, LPN   [id number]   Nurse Notes: Patient due Hemoglobin A1C and Diabetic kidney evaluation-Urine ACR

## 2022-01-08 ENCOUNTER — Other Ambulatory Visit: Payer: Self-pay | Admitting: Family Medicine

## 2022-01-31 ENCOUNTER — Encounter: Payer: Self-pay | Admitting: Family Medicine

## 2022-01-31 ENCOUNTER — Ambulatory Visit (INDEPENDENT_AMBULATORY_CARE_PROVIDER_SITE_OTHER): Payer: Medicare Other | Admitting: Family Medicine

## 2022-01-31 ENCOUNTER — Ambulatory Visit: Payer: Medicare Other

## 2022-01-31 ENCOUNTER — Ambulatory Visit (INDEPENDENT_AMBULATORY_CARE_PROVIDER_SITE_OTHER): Payer: Medicare Other

## 2022-01-31 VITALS — BP 110/70 | HR 56 | Temp 98.1°F | Wt 188.0 lb

## 2022-01-31 DIAGNOSIS — M79661 Pain in right lower leg: Secondary | ICD-10-CM

## 2022-01-31 DIAGNOSIS — M25571 Pain in right ankle and joints of right foot: Secondary | ICD-10-CM | POA: Diagnosis not present

## 2022-01-31 DIAGNOSIS — I6523 Occlusion and stenosis of bilateral carotid arteries: Secondary | ICD-10-CM | POA: Diagnosis not present

## 2022-01-31 DIAGNOSIS — M7989 Other specified soft tissue disorders: Secondary | ICD-10-CM | POA: Diagnosis not present

## 2022-01-31 DIAGNOSIS — Z8739 Personal history of other diseases of the musculoskeletal system and connective tissue: Secondary | ICD-10-CM | POA: Diagnosis not present

## 2022-01-31 DIAGNOSIS — M79662 Pain in left lower leg: Secondary | ICD-10-CM

## 2022-01-31 NOTE — Addendum Note (Signed)
Addended by: [name] A on: 01/31/2022 11:17 AM   Modules accepted: Orders

## 2022-01-31 NOTE — Progress Notes (Addendum)
   Subjective:    Patient ID: Luke [name], male    DOB: [birth year], 76 y.o.   MRN: [id number]  HPI Here for one week of swelling, bruising, and mild pain in the right  lower leg. No recent trauma that he can remember. He is on Eliquis.    Review of Systems  Constitutional: Negative.   Respiratory: Negative.    Cardiovascular: Negative.   Musculoskeletal:  Positive for joint swelling.       Objective:   Physical Exam Constitutional:      Appearance: Normal appearance.     Comments: He walks easily   Cardiovascular:     Rate and Rhythm: Normal rate and regular rhythm.     Pulses: Normal pulses.     Heart sounds: Normal heart sounds.  Pulmonary:     Effort: Pulmonary effort is normal.     Breath sounds: Normal breath sounds.  Musculoskeletal:     Comments: There is mild swelling in the right lower leg just above the ankle and into the right lateral ankle area. He is tender over the right anterior distal tibia. There is ecchymosis over the anterior lower shin and over the lateral right foot   Neurological:     Mental Status: He is alert.           Assessment & Plan:  Bruising and swelling in the right lower leg and ankle. This is most likely the result of a minor trauma, and the Eliquis has accentuated it. We will get Xrays of the right tibia and ankle today.  [name], MD

## 2022-02-23 ENCOUNTER — Telehealth: Payer: Self-pay | Admitting: Family Medicine

## 2022-02-23 DIAGNOSIS — I480 Paroxysmal atrial fibrillation: Secondary | ICD-10-CM

## 2022-02-23 DIAGNOSIS — I10 Essential (primary) hypertension: Secondary | ICD-10-CM

## 2022-02-23 NOTE — Telephone Encounter (Signed)
Done

## 2022-02-28 ENCOUNTER — Other Ambulatory Visit: Payer: Self-pay

## 2022-02-28 MED ORDER — METOPROLOL TARTRATE 25 MG PO TABS: 12.5000 mg | ORAL_TABLET | Freq: Two times a day (BID) | ORAL | 3 refills | Status: DC

## 2022-03-09 ENCOUNTER — Telehealth: Payer: Self-pay | Admitting: Pharmacist

## 2022-03-09 NOTE — Progress Notes (Signed)
Care Management & Coordination Services Pharmacy Team  Name: Luke [name]  MRN: [id number] DOB: 08/07/1945  Reason for Encounter: Appointment Reminder  Contacted patient on 03/09/2022   Medications: Outpatient Encounter Medications as of 03/09/2022  Medication Sig Note   albuterol (VENTOLIN HFA) 108 (90 Base) MCG/ACT inhaler Inhale 2 puffs into the lungs every 6 hrs as needed for Wheezing or SOB    amLODipine (NORVASC) 10 MG tablet TAKE 1 TABLET DAILY    apixaban (ELIQUIS) 5 MG TABS tablet Take 1 tablet (5 mg total) by mouth 2 (two) times daily.    atorvastatin (LIPITOR) 20 MG tablet Take 1 tablet (20 mg total) by mouth daily.    famotidine (PEPCID) 20 MG tablet TAKE ONE TABLET BY MOUTH  PRN    losartan (COZAAR) 50 MG tablet Take 1 tablet (50 mg total) by mouth daily.    meclizine (ANTIVERT) 25 MG tablet Take 1 tablet (25 mg total) by mouth every 6 (six) hours as needed for dizziness.    metoprolol tartrate (LOPRESSOR) 25 MG tablet Take 0.5 tablets (12.5 mg total) by mouth 2 (two) times daily.    sildenafil (VIAGRA) 50 MG tablet Take 1 tablet (50 mg total) by mouth as needed. 10/11/2020: As needed   No facility-administered encounter medications on file as of 03/09/2022.   Lab Results  Component Value Date/Time   HGBA1C 6.3 03/17/2021 10:49 AM   HGBA1C 6.2 03/15/2020 11:22 AM   MICROALBUR 1.3 10/27/2013 09:27 AM    BP Readings from Last 3 Encounters:  01/31/22 110/70  11/28/21 124/64  11/15/21 120/76   Patient contacted to confirm telephone appointment with [name] PharmD, on 03/12/2022 at 10:45.  Do you have any problems getting your medications? Patient denies  What is your top health concern you would like to discuss at your upcoming visit? Patient denies  Have you seen any other providers since your last visit with PCP? Patient denies  Care Gaps: AWV - completed 01/01/2023 Last BP - 110/70 on 01/31/2022 Last A1C - 6.3 on 03/17/2021 Foot exam - never done Eye exam  - never done Urine ACR - overdue HGA1C - overdue Covid - overdue Shingrix - postponed  Star Rating Drugs: Atorvastatin  20 mg - last filled 02/09/2022 90 DS at [facility] Losartan 50 mg - last filled 01/08/2022 90 DS at [place],  [place] Pharmacist Assistant [phone]

## 2022-03-12 ENCOUNTER — Ambulatory Visit: Payer: Medicare Other | Admitting: Pharmacist

## 2022-03-12 DIAGNOSIS — I480 Paroxysmal atrial fibrillation: Secondary | ICD-10-CM

## 2022-03-12 DIAGNOSIS — I10 Essential (primary) hypertension: Secondary | ICD-10-CM

## 2022-03-12 NOTE — Patient Instructions (Signed)
Hi Luke [name],  It was great catching up with you again! Please make sure to bring your blood pressure cuff to your appointment with Dr. Sarajane Jews next Monday to make sure it's accurate.   Please reach out to me if you have any questions or need anything!  Best, Maddie  [name], PharmD, [place] Pharmacist [place] at [place]

## 2022-03-12 NOTE — Progress Notes (Signed)
Care Management & Coordination Services Pharmacy Note  03/12/2022 Name:  Luke [name] MRN:  [id number] DOB:  1945-06-21  Summary: BP mostly at goal < 130/80 per home readings LDL not quite at goal < 70, TGs not at goal < 150   Recommendations/Changes made from today's visit: -Recommended bringing BP cuff to next office visit to ensure accuracy -Recommended routine exercise of walking 20-30 minutes per day -Consider reaching out to cardiology to discuss holding metoprolol due to low HR and fatigue  Follow up plan:    Subjective: Luke [name] is an 77 y.o. year old male who is a primary patient of Luke Morale, [name].  The care coordination team was consulted for assistance with disease management and care coordination needs.    Engaged with patient by telephone for follow up visit.  Patient Care Team: Luke Morale, [name] as PCP - General (Family Medicine) Luke [name] as PCP - Cardiology (Cardiology) Luke [name], [facility] as Pharmacist (Pharmacist)  Recent office visits: 01/31/22 Luke [name]: Patient presented for pain in right lower leg. Completed x-rays of ankle and tibia/fibula and all were normal.  12/29/21 Luke Arbour, LPN: Patient presented for AWV.  11/15/2021 Luke Penna [name] - Patient was seen for right lumbar pain and additional concerns. Discontinued Medrol Dosepak. No follow up noted.    10/23/2021 Luke Penna [name] - Patient was seen for Acute right-sided thoracic back pain. Started Metaxolone and Medrol dosepak. No follow up noted.  Recent consult visits: 01/16/22 Luke Kida, [name] ([facility]): Patient presented for annual exam. Recommended trial of Claritin for congestion.   11/28/2021 Luke Breeding [name] (cardiology) - Patient was seen for PAF and additional concerns. Discontinued Diltiazem. Follow up in 12 months.    11/07/2021 Luke [name] (ENT) - Patient was seen for Sensorineural hearing loss (SNHL) of both ears and Tnnitus of both  ears.No medication changes. Follow up in 1 year.   Hospital visits: None in previous 6 months   Objective:  Lab Results  Component Value Date   CREATININE 1.08 03/17/2021   BUN 18 03/17/2021   GFR 67.37 03/17/2021   GFRNONAA >60 10/11/2020   GFRAA >60 04/19/2018   NA 141 03/17/2021   K 4.0 03/17/2021   CALCIUM 9.0 03/17/2021   CO2 28 03/17/2021   GLUCOSE 99 03/17/2021    Lab Results  Component Value Date/Time   HGBA1C 6.3 03/17/2021 10:49 AM   HGBA1C 6.2 03/15/2020 11:22 AM   GFR 67.37 03/17/2021 10:49 AM   GFR 75.31 03/15/2020 11:22 AM   MICROALBUR 1.3 10/27/2013 09:27 AM    Last diabetic Eye exam: No results found for: "HMDIABEYEEXA"  Last diabetic Foot exam: No results found for: "HMDIABFOOTEX"   Lab Results  Component Value Date   CHOL 139 08/02/2021   HDL 31 (L) 08/02/2021   LDLCALC 76 08/02/2021   LDLDIRECT 73.0 02/08/2015   TRIG 188 (H) 08/02/2021   CHOLHDL 4.5 08/02/2021       Latest Ref Rng & Units 08/02/2021   10:27 AM 03/17/2021   10:49 AM 03/15/2020   11:22 AM  Hepatic Function  Total Protein 6.0 - 8.5 g/dL 6.5  6.8  6.6   Albumin 3.7 - 4.7 g/dL 4.0  4.2  4.0   AST 0 - 40 IU/L '13  15  12   '$ ALT 0 - 44 IU/L '13  18  14   '$ Alk Phosphatase 44 - 121 IU/L 101  81  90  Total Bilirubin 0.0 - 1.2 mg/dL 0.3  0.5  0.4   Bilirubin, Direct 0.00 - 0.40 mg/dL 0.10  0.1  0.1     Lab Results  Component Value Date/Time   TSH 1.55 03/17/2021 10:49 AM   TSH 1.80 03/15/2020 11:22 AM   FREET4 0.92 03/17/2021 10:49 AM   FREET4 0.90 03/15/2020 11:22 AM       Latest Ref Rng & Units 03/17/2021   10:49 AM 10/11/2020   12:33 PM 03/15/2020   11:22 AM  CBC  WBC 4.0 - 10.5 K/uL 9.9  10.3  10.7   Hemoglobin 13.0 - 17.0 g/dL 14.4  14.0  13.9   Hematocrit 39.0 - 52.0 % 43.7  41.9  41.5   Platelets 150.0 - 400.0 K/uL 332.0  339  310.0     No results found for: "VD25OH", "VITAMINB12"  Clinical ASCVD: Yes  The ASCVD Risk score (Arnett DK, et al., 2019) failed to  calculate for the following reasons:   The patient has a prior MI or stroke diagnosis       12/29/2021    2:02 PM 11/15/2021   11:14 AM 10/23/2021    4:21 PM  Depression screen PHQ 2/9  Decreased Interest 0 0 0  Down, Depressed, Hopeless 0 0 0  PHQ - 2 Score 0 0 0  Altered sleeping 0 0 1  Tired, decreased energy 0 0 1  Change in appetite 0 0 0  Feeling bad or failure about yourself  0 0 0  Trouble concentrating 0 0 0  Moving slowly or fidgety/restless 0 0 0  Suicidal thoughts 0 0 0  PHQ-9 Score 0 0 2  Difficult doing work/chores Not difficult at all Not difficult at all Not difficult at all     CHA2DS2/VAS Stroke Risk Points  Current as of 23 minutes ago     6 >= 2 Points: High Risk  1 - 1.99 Points: Medium Risk  0 Points: Low Risk    Last Change:       Details    This score determines the patient's risk of having a stroke if the  patient has atrial fibrillation.       Points Metrics  0 Has Congestive Heart Failure:  No    Current as of 23 minutes ago  0 Has Vascular Disease:  No    Current as of 23 minutes ago  1 Has Hypertension:  Yes    Current as of 23 minutes ago  2 Age:  19    Current as of 23 minutes ago  1 Has Diabetes:  Yes    Current as of 23 minutes ago  2 Had Stroke:  Yes  Had TIA:  No  Had Thromboembolism:  No    Current as of 23 minutes ago  0 Male:  No    Current as of 23 minutes ago     Social History   Tobacco Use  Smoking Status Never  Smokeless Tobacco Never   BP Readings from Last 3 Encounters:  01/31/22 110/70  11/28/21 124/64  11/15/21 120/76   Pulse Readings from Last 3 Encounters:  01/31/22 (!) 56  11/28/21 (!) 58  11/15/21 (!) 54   Wt Readings from Last 3 Encounters:  01/31/22 188 lb (85.3 kg)  12/29/21 180 lb (81.6 kg)  11/28/21 183 lb 12.8 oz (83.4 kg)   BMI Readings from Last 3 Encounters:  01/31/22 29.44 kg/m  12/29/21 28.19 kg/m  11/28/21 28.79 kg/m  Allergies  Allergen Reactions   Aspirin Other (See  Comments)    REACTION: ULCER '@LARGE'$  DOSES   Hydrochlorothiazide Other (See Comments)    Leg cramps   Irbesartan Other (See Comments)    Headaches    Symbicort [Budesonide-Formoterol Fumarate] Other (See Comments)    legcramps    Medications Reviewed Today     Reviewed by Luke Morale, [name] (Physician) on 01/31/22 at 1050  Med List Status: <None>   Medication Order Taking? Sig Documenting Provider Last Dose Status Informant  albuterol (VENTOLIN HFA) 108 (90 Base) MCG/ACT inhaler [id number] Yes Inhale 2 puffs into the lungs every 6 hrs as needed for Wheezing or SOB Luke Morale, [name] Taking Active   amLODipine (NORVASC) 10 MG tablet [id number] Yes TAKE 1 TABLET DAILY Luke Morale, [name] Taking Active   apixaban (ELIQUIS) 5 MG TABS tablet [id number]  Take 1 tablet (5 mg total) by mouth 2 (two) times daily. Luke Morale, [name]  Expired 11/28/21 2359   atorvastatin (LIPITOR) 20 MG tablet [id number]  Take 1 tablet (20 mg total) by mouth daily. Luke [name]  Expired 11/28/21 2359   famotidine (PEPCID) 20 MG tablet [id number] Yes TAKE ONE TABLET BY MOUTH  PRN Provider, Historical, [name] Taking Active   losartan (COZAAR) 50 MG tablet [id number] Yes Take 1 tablet (50 mg total) by mouth daily. Luke Morale, [name] Taking Active   meclizine (ANTIVERT) 25 MG tablet [id number] Yes Take 1 tablet (25 mg total) by mouth every 6 (six) hours as needed for dizziness. Luke Morale, [name] Taking Active   metoprolol tartrate (LOPRESSOR) 25 MG tablet [id number] Yes Take 0.5 tablets (12.5 mg total) by mouth 2 (two) times daily. Luke Morale, [name] Taking Active   sildenafil (VIAGRA) 50 MG tablet [id number] Yes Take 1 tablet (50 mg total) by mouth as needed. Luke Morale, [name] Taking Active            Med Note Eino Farber   Tue Oct 11, 2020  1:41 PM) As needed            Patient Active Problem List   Diagnosis Date Noted   Type 2 diabetes mellitus with complication, without long-term current use of insulin  ([place]) 11/27/2021   Tinnitus, bilateral 12/07/2019   Numbness 07/29/2018   Educated about COVID-19 virus infection 07/29/2018   Cerebrovascular accident (CVA) due to embolism of cerebral artery ([place]) 05/16/2018   Dyslipidemia 05/16/2018   Nodule of upper lobe of right lung 04/29/2018   Bilateral carotid artery stenosis 04/29/2018   Multiple thyroid nodules 04/29/2018   Acute CVA (cerebrovascular accident) ([place]) 04/19/2018   BPH with urinary obstruction 02/21/2017   NSVT (nonsustained ventricular tachycardia) ([place]) 09/27/2016   PAF (paroxysmal atrial fibrillation) ([place]) 09/25/2016   Genital herpes 02/18/2016   Hyperglycemia 02/18/2016   Left knee pain 02/01/2015   Restless leg syndrome 03/30/2013   Allergic rhinitis 10/31/2009   Headache 10/31/2009   DEGENERATIVE [place] DISEASE, CERVICAL SPINE, W/RADICULOPATHY 07/28/2008   Essential hypertension 07/08/2007   Asthma 07/02/2007   Hyperlipidemia 05/02/2007   ERECTILE DYSFUNCTION, MILD 05/02/2007   PVC (premature ventricular contraction) 05/02/2007   GERD (gastroesophageal reflux disease) 05/02/2007    Immunization History  Administered Date(s) Administered   Fluad Quad(high Dose 65+) 11/11/2018, 11/28/2020, 11/29/2021   Influenza Split 12/18/2010, 01/04/2012   Influenza Whole 12/20/2009   Influenza, High Dose Seasonal PF 12/17/2012, 12/13/2015, 11/26/2016, 12/06/2017, 12/18/2019   Influenza,inj,Quad PF,6+ Mos 11/12/2013, 01/06/2015   PFIZER(Purple  Top)SARS-COV-2 Vaccination 03/25/2019, 04/15/2019, 12/02/2019   Pneumococcal Conjugate-13 11/12/2013   Pneumococcal Polysaccharide-23 06/13/2011   Td 04/23/2003   Tdap 11/27/2016   Zoster, Live 01/16/2013     Compliance/Adherence/Medication fill history: Care Gaps: Foot exam, eye exam, urine microalbumin, A1c, COVID booster, shingrix Last BP - 110/70 on 01/31/2022 Last A1C - 6.3 on 03/17/2021  Star-Rating Drugs: Atorvastatin  20 mg - last filled 02/09/2022 90 DS at [place] 50 mg - last filled 01/08/2022 90 DS at [facility]  SDOH:  (Social Determinants of Health) assessments and interventions performed: Yes  SDOH Interventions    [place] Coordination from 03/12/2022 in [place] from 12/29/2021 in [place] at [place] from 12/28/2020 in [place] at [place] from 12/23/2019 in [place] at [place] Management from 12/14/2019 in [place] at [place] Interventions -- Intervention Not Indicated Intervention Not Indicated Intervention Not Indicated --  Housing Interventions -- Intervention Not Indicated Intervention Not Indicated Intervention Not Indicated --  Transportation Interventions -- Intervention Not Indicated Intervention Not Indicated Intervention Not Indicated Intervention Not Indicated  Utilities Interventions -- Intervention Not Indicated -- -- --  Alcohol Usage Interventions -- Intervention Not Indicated (Score <7) -- -- --  Depression Interventions/Treatment  -- -- -- PHQ2-9 Score <4 Follow-up Not Indicated --  Financial Strain Interventions Intervention Not Indicated Intervention Not Indicated Intervention Not Indicated Intervention Not Indicated Intervention Not Indicated  Physical Activity Interventions -- Intervention Not Indicated Intervention Not Indicated Intervention Not Indicated --  Stress Interventions -- Intervention Not Indicated Intervention Not Indicated Intervention Not Indicated --  Social Connections Interventions -- Intervention Not Indicated Intervention Not Indicated Intervention Not Indicated --      SDOH Screenings   Food Insecurity: No Food Insecurity (12/29/2021)  Housing: Low Risk  (12/29/2021)  Transportation Needs: No Transportation Needs (12/29/2021)  Utilities: Not At Risk (12/29/2021)  Alcohol Screen: Low Risk  (12/29/2021)   Depression (PHQ2-9): Low Risk  (12/29/2021)  Financial Resource Strain: Low Risk  (03/12/2022)  Physical Activity: Inactive (12/29/2021)  Social Connections: Socially Integrated (12/29/2021)  Stress: No Stress Concern Present (12/29/2021)  Tobacco Use: Low Risk  (01/31/2022)    Medication Assistance: None required.  Patient affirms current coverage meets needs.  Medication Access: Within the past 30 days, how often has patient missed a dose of medication? once Is a pillbox or other method used to improve adherence? Yes  and uses a timer Factors that may affect medication adherence? no barriers identified Are meds synced by current pharmacy? No  Are meds delivered by current pharmacy? Yes  Does patient experience delays in picking up medications due to transportation concerns? No   [facility] Services Reviewed: Is patient disadvantaged to use [facility]?: Yes  Current Rx insurance plan: [employer] Name and location of Current pharmacy:  [place], [place] [address] Phone: [phone] Fax: [phone]  [facility] [address] [place], [place]. [place] [address] Phone: [phone] Fax: [phone]  [facility] [place], [place] [facility] [address] [facility] [address] [place] [address] Phone: [phone] Fax: [phone]  [facility] services reviewed with patient today?: No  Patient requests to transfer care to [facility] Pharmacy?: No  Reason  patient declined to change pharmacies: Disadvantaged due to insurance/mail order and Not mentioned at this visit  Patient is having some family coming in from out of town this weekend for his birthday and him and his wife are going out to eat on Thursday as well. He is looking forward to  this.  Patient doesn't think he had any more sweets than usual and doesn't think he made any progress with that because of the last 2 months of holidays. He has been doing more moving around but not more exercise per say. Him and his wife try to remain active during the day and don't turn on the TV until later in the day to avoid sitting and watching. He does do walking when he can. He will try to get back into even walking around the neighborhood for 20-30 minutes.   Patient is still feeling tired and wasn't sure if this was age or his heart rate. He usually sees readings around 55-65 . He hasn't had an Afib incident since last spring so he is hesitant to make any medication changes. He will try to pay attention to the days he is feeling more fatigued to check his BP and his HR to make sure they aren't low. He does check his BP periodically and it's usually higher than the office readings (around 130-140s/70-80s). Patient has a physical coming up next Monday and will bring his cuff to compare to make sure it's accurate.  Assessment/Plan  Patient Care Plan: [facility] Pharmacy Assessment/Plan     Problem Identified: Problem: Hypertension, Hyperlipidemia, Atrial Fibrillation, GERD, BPH, Allergic Rhinitis, and SOB, ED, muscle spasms, HSV prophylaxis      Long-Range Goal: Patient-Specific Goal   Start Date: 09/06/2020  Expected End Date: 09/06/2021  Recent Progress: On track  Priority: High  Note:   Hypertension (BP goal <130/80) -Controlled -Current treatment: Amlodipine 10 mg 1 tablet daily - in AM - Appropriate, Effective, Safe, Accessible Losartan 50 mg 1 tablet daily - in AM  - Appropriate, Effective, Safe, Accessible Metoprolol tartrate 25 mg 1/2 tablet twice daily - Appropriate, Effective, Safe, Accessible -Medications previously tried: none  -Current home readings: 130-140/70-80s (checking periodically) -Current dietary habits: did not discuss -Current exercise habits: did not  discuss -Denies hypotensive/hypertensive symptoms -Educated on BP goals and benefits of medications for prevention of heart attack, stroke and kidney damage; Importance of home blood pressure monitoring; Proper BP monitoring technique; Symptoms of hypotension and importance of maintaining adequate hydration; -Counseled to monitor BP at home weekly, document, and provide log at future appointments -Counseled on diet and exercise extensively Recommended to continue current medication Recommended bringing BP cuff to next office visit to ensure accuracy.  Hyperlipidemia: (LDL goal < 70) -Not ideally controlled -Current treatment: Atorvastatin 20 mg 1 tablet at bedtime - Appropriate, Query effective, Safe, Accessible -Medications previously tried: simvastatin  -Current dietary patterns: did not discuss -Current exercise habits: did not discuss -Educated on Cholesterol goals;  Benefits of statin for ASCVD risk reduction; Importance of limiting foods high in cholesterol; -Counseled on diet and exercise extensively Recommended to continue current medication Counseled on benefits of exercise and limiting carb/sweets intake for triglyceride lowering.  Pre-diabetes (A1c goal <6.5%) -Controlled -Current medications: No medications -Medications previously tried: none  -Current home glucose readings fasting glucose: does not need to check post prandial glucose: does not need to check -Denies hypoglycemic/hyperglycemic symptoms -Current meal patterns:  breakfast: did not discuss  lunch: did not discuss   dinner: did not discuss  snacks: did not discuss  drinks: did not discuss  -Current exercise: not routinely -Educated on A1c and blood sugar goals; Counseled to check feet daily and get yearly eye exams -Counseled to check feet daily and get yearly eye exams -Counseled on diet and exercise extensively   Atrial Fibrillation (Goal: prevent stroke and major  bleeding) -Controlled -CHADSVASC: 4 -Current treatment: Rate control: metoprolol tartrate 25 mg 1/2 tablet twice daily - Appropriate, Effective, Safe, Accessible Anticoagulation: Eliquis 5 mg 1 tablet twice daily - Appropriate, Effective, Safe, Accessible -Medications previously tried: carvedilol (fatigue, bradycardia) -Home BP and HR readings: 55-68; , refer to above -Counseled on avoidance of NSAIDs due to increased bleeding risk with anticoagulants; -Recommended to continue current medication  GERD (Goal: minimize symptoms) -Controlled -Current treatment  Famotidine 20 mg 1 tablet as needed - Appropriate, Effective, Safe, Accessible -Medications previously tried: none  -Recommended to continue current medication  Shortness of breath (Goal: improve breathing) -Controlled -Current treatment  Albuterol HFA inhale 2 puffs every 6 hours as needed (not used in several years) - Appropriate, Effective, Safe, Accessible -Medications previously tried: none  -Recommended to continue current medication  ED (Goal: minimize symptoms) -Controlled -Current treatment  Sildenafil 50 mg 1 tablet as needed - Appropriate, Effective, Safe, Accessible -Medications previously tried: none  -Recommended to continue current medication  Muscle spasms (Goal: minimize symptoms) -Controlled -Current treatment  None -Medications previously tried: metaxolone (not needed) -Recommended to continue current medication  HSV prophylaxis (Goal: prevent HSV infections) -Controlled -Current treatment  Acyclovir 200 mg 1 capsule every other day - Appropriate, Effective, Safe, Accessible -Medications previously tried: none  -Recommended to continue current medication  Insomnia (Goal: improve quality and quantity of sleep) -Not ideally controlled -Current treatment  Tylenol PM at bedtime as needed - Query Appropriate, Query effective, Query Safe, Accessible -Medications previously tried: melatonin  (ineffective)  -Recommended discussion with PCP for safer alternative.   Health Maintenance -Vaccine gaps: shingrix, COVID booster -Current therapy:  None -Educated on Cost vs benefit of each product must be carefully weighed by individual consumer -Patient is satisfied with current therapy and denies issues -Recommended to continue as is      [name], PharmD, [facility] [place]

## 2022-03-19 ENCOUNTER — Encounter: Payer: Self-pay | Admitting: Family Medicine

## 2022-03-19 ENCOUNTER — Ambulatory Visit (INDEPENDENT_AMBULATORY_CARE_PROVIDER_SITE_OTHER): Payer: Medicare Other | Admitting: Family Medicine

## 2022-03-19 VITALS — BP 120/70 | HR 56 | Temp 98.2°F | Ht 65.25 in | Wt 185.0 lb

## 2022-03-19 DIAGNOSIS — E785 Hyperlipidemia, unspecified: Secondary | ICD-10-CM

## 2022-03-19 DIAGNOSIS — N138 Other obstructive and reflux uropathy: Secondary | ICD-10-CM | POA: Diagnosis not present

## 2022-03-19 DIAGNOSIS — I10 Essential (primary) hypertension: Secondary | ICD-10-CM | POA: Diagnosis not present

## 2022-03-19 DIAGNOSIS — G2581 Restless legs syndrome: Secondary | ICD-10-CM

## 2022-03-19 DIAGNOSIS — I480 Paroxysmal atrial fibrillation: Secondary | ICD-10-CM

## 2022-03-19 DIAGNOSIS — J4520 Mild intermittent asthma, uncomplicated: Secondary | ICD-10-CM

## 2022-03-19 DIAGNOSIS — I6523 Occlusion and stenosis of bilateral carotid arteries: Secondary | ICD-10-CM

## 2022-03-19 DIAGNOSIS — I6340 Cerebral infarction due to embolism of unspecified cerebral artery: Secondary | ICD-10-CM | POA: Diagnosis not present

## 2022-03-19 DIAGNOSIS — A6000 Herpesviral infection of urogenital system, unspecified: Secondary | ICD-10-CM

## 2022-03-19 DIAGNOSIS — K219 Gastro-esophageal reflux disease without esophagitis: Secondary | ICD-10-CM | POA: Diagnosis not present

## 2022-03-19 DIAGNOSIS — E042 Nontoxic multinodular goiter: Secondary | ICD-10-CM

## 2022-03-19 DIAGNOSIS — N401 Enlarged prostate with lower urinary tract symptoms: Secondary | ICD-10-CM

## 2022-03-19 DIAGNOSIS — E118 Type 2 diabetes mellitus with unspecified complications: Secondary | ICD-10-CM

## 2022-03-19 LAB — CBC WITH DIFFERENTIAL/PLATELET
Basophils Absolute: 0.1 10*3/uL (ref 0.0–0.1)
Basophils Relative: 0.7 % (ref 0.0–3.0)
Eosinophils Absolute: 0.5 10*3/uL (ref 0.0–0.7)
Eosinophils Relative: 4.8 % (ref 0.0–5.0)
HCT: 43 % (ref 39.0–52.0)
Hemoglobin: 14.4 g/dL (ref 13.0–17.0)
Lymphocytes Relative: 25.3 % (ref 12.0–46.0)
Lymphs Abs: 2.6 10*3/uL (ref 0.7–4.0)
MCHC: 33.4 g/dL (ref 30.0–36.0)
MCV: 84.5 fl (ref 78.0–100.0)
Monocytes Absolute: 0.5 10*3/uL (ref 0.1–1.0)
Monocytes Relative: 5 % (ref 3.0–12.0)
Neutro Abs: 6.6 10*3/uL (ref 1.4–7.7)
Neutrophils Relative %: 64.2 % (ref 43.0–77.0)
Platelets: 325 10*3/uL (ref 150.0–400.0)
RBC: 5.09 Mil/uL (ref 4.22–5.81)
RDW: 13.4 % (ref 11.5–15.5)
WBC: 10.3 10*3/uL (ref 4.0–10.5)

## 2022-03-19 LAB — BASIC METABOLIC PANEL
BUN: 18 mg/dL (ref 6–23)
CO2: 24 mEq/L (ref 19–32)
Calcium: 9.1 mg/dL (ref 8.4–10.5)
Chloride: 106 mEq/L (ref 96–112)
Creatinine, Ser: 1.02 mg/dL (ref 0.40–1.50)
GFR: 71.64 mL/min (ref >60.00)
Glucose, Bld: 113 mg/dL — ABNORMAL HIGH (ref 70–99)
Potassium: 4.1 mEq/L (ref 3.5–5.1)
Sodium: 140 mEq/L (ref 135–145)

## 2022-03-19 LAB — LIPID PANEL
Cholesterol: 139 mg/dL (ref 0–200)
HDL: 32.6 mg/dL — ABNORMAL LOW (ref >39.00)
LDL Cholesterol: 68 mg/dL (ref 0–99)
NonHDL: 106.74
Total CHOL/HDL Ratio: 4
Triglycerides: 194 mg/dL — ABNORMAL HIGH (ref 0.0–149.0)
VLDL: 38.8 mg/dL (ref 0.0–40.0)

## 2022-03-19 LAB — HEPATIC FUNCTION PANEL
ALT: 14 U/L (ref 0–53)
AST: 12 U/L (ref 0–37)
Albumin: 4.1 g/dL (ref 3.5–5.2)
Alkaline Phosphatase: 89 U/L (ref 39–117)
Bilirubin, Direct: 0.1 mg/dL (ref 0.0–0.3)
Total Bilirubin: 0.5 mg/dL (ref 0.2–1.2)
Total Protein: 6.8 g/dL (ref 6.0–8.3)

## 2022-03-19 LAB — HEMOGLOBIN A1C: Hgb A1c MFr Bld: 6.6 % — ABNORMAL HIGH (ref 4.6–6.5)

## 2022-03-19 LAB — TSH: TSH: 1.29 u[IU]/mL (ref 0.35–5.50)

## 2022-03-19 LAB — PSA: PSA: 3.65 ng/mL (ref 0.10–4.00)

## 2022-03-19 MED ORDER — VALACYCLOVIR HCL 1 G PO TABS: 1000.0000 mg | ORAL_TABLET | Freq: Two times a day (BID) | ORAL | 5 refills | Status: DC | PRN

## 2022-03-19 NOTE — Progress Notes (Signed)
Subjective:    Patient ID: Luke [name], male    DOB: 11/13/1945, 77 y.o.   MRN: [id number]  HPI Here to follow up on issues. He recently had an outbreak of herpes, which he has not had for many years. His BP is stable. He has not been in atrial fibrillation since April of last year. He sees Dr. Percival Spanish once a year. His asthma, GERD, and restless legs are stable.    Review of Systems  Constitutional: Negative.   HENT: Negative.    Eyes: Negative.   Respiratory: Negative.    Cardiovascular: Negative.   Gastrointestinal: Negative.   Genitourinary: Negative.   Musculoskeletal: Negative.   Skin:  Positive for rash.  Neurological: Negative.   Psychiatric/Behavioral: Negative.         Objective:   Physical Exam Constitutional:      General: He is not in acute distress.    Appearance: Normal appearance. He is well-developed. He is not diaphoretic.  HENT:     Head: Normocephalic and atraumatic.     Right Ear: External ear normal.     Left Ear: External ear normal.     Nose: Nose normal.     Mouth/Throat:     Pharynx: No oropharyngeal exudate.  Eyes:     General: No scleral icterus.       Right eye: No discharge.        Left eye: No discharge.     Conjunctiva/sclera: Conjunctivae normal.     Pupils: Pupils are equal, round, and reactive to light.  Neck:     Thyroid: No thyromegaly.     Vascular: No JVD.     Trachea: No tracheal deviation.  Cardiovascular:     Rate and Rhythm: Normal rate and regular rhythm.     Heart sounds: Normal heart sounds. No murmur heard.    No friction rub. No gallop.  Pulmonary:     Effort: Pulmonary effort is normal. No respiratory distress.     Breath sounds: Normal breath sounds. No wheezing or rales.  Chest:     Chest wall: No tenderness.  Abdominal:     General: Bowel sounds are normal. There is no distension.     Palpations: Abdomen is soft. There is no mass.     Tenderness: There is no abdominal tenderness. There is no guarding  or rebound.  Genitourinary:    Penis: No tenderness.      Testes: Normal.     Prostate: Normal.     Rectum: Normal. Guaiac result negative.     Comments: There are several tiny red vesicles on the penis  Musculoskeletal:        General: No tenderness. Normal range of motion.     Cervical back: Neck supple.  Lymphadenopathy:     Cervical: No cervical adenopathy.  Skin:    General: Skin is warm and dry.     Coloration: Skin is not pale.     Findings: No erythema or rash.  Neurological:     Mental Status: He is alert and oriented to person, place, and time.     Cranial Nerves: No cranial nerve deficit.     Motor: No abnormal muscle tone.     Coordination: Coordination normal.     Deep Tendon Reflexes: Reflexes are normal and symmetric. Reflexes normal.  Psychiatric:        Behavior: Behavior normal.        Thought Content: Thought content normal.  Judgment: Judgment normal.           Assessment & Plan:  His HTN and atrial fibrillation are stable. His asthma and GERD are stable. We will supply him with some Valtrex to use for the herpes. Get fasting labs to check lipids, A1c, etc. We spent a total of (33   ) minutes reviewing records and discussing these issues.  [name], MD

## 2022-03-26 ENCOUNTER — Ambulatory Visit (INDEPENDENT_AMBULATORY_CARE_PROVIDER_SITE_OTHER): Payer: Medicare Other | Admitting: Family Medicine

## 2022-03-26 ENCOUNTER — Other Ambulatory Visit: Payer: Self-pay

## 2022-03-26 ENCOUNTER — Encounter: Payer: Self-pay | Admitting: Family Medicine

## 2022-03-26 VITALS — BP 124/76 | HR 58 | Temp 98.3°F | Wt 185.0 lb

## 2022-03-26 DIAGNOSIS — I6523 Occlusion and stenosis of bilateral carotid arteries: Secondary | ICD-10-CM

## 2022-03-26 DIAGNOSIS — H6123 Impacted cerumen, bilateral: Secondary | ICD-10-CM | POA: Diagnosis not present

## 2022-03-26 DIAGNOSIS — N489 Disorder of penis, unspecified: Secondary | ICD-10-CM | POA: Diagnosis not present

## 2022-03-26 DIAGNOSIS — R7303 Prediabetes: Secondary | ICD-10-CM | POA: Diagnosis not present

## 2022-03-26 NOTE — Progress Notes (Signed)
   Subjective:    Patient ID: Luke [name], male    DOB: [birth year], 77 y.o.   MRN: [id number]  HPI Here with several issues. First he has concerns about recent lab work done here which was normal except for a TG of 194 and an A1c of 6.6%. He says he eats whatever he wants. Second he asks [place] to check a lesion on the penis that appeared 2 weeks ago. This is not painful. It has been slowly growing larger. He has been taking Valtrex pills and applying Acyclovir ointment thinking this was one of his Herpes breakouts, but they have not been helping. Third he asks to check his ears. He has had some muffled hearing and some slight dizziness for a few weeks. No ear pain.    Review of Systems  Constitutional: Negative.   HENT:  Positive for hearing loss.   Respiratory: Negative.    Cardiovascular: Negative.   Neurological:  Positive for dizziness.       Objective:   Physical Exam Constitutional:      Appearance: Normal appearance.  HENT:     Right Ear: There is impacted cerumen.     Left Ear: There is impacted cerumen.  Cardiovascular:     Rate and Rhythm: Normal rate.     Pulses: Normal pulses.     Heart sounds: Normal heart sounds.  Pulmonary:     Effort: Pulmonary effort is normal.     Breath sounds: Normal breath sounds.  Skin:    Comments: There is a single slightly raised erythematous lesion on the penile shaft that measures about 1.0 cm across    Neurological:     Mental Status: He is alert.           Assessment & Plan:  First he is prediabetic, and we discussed how the TG often mirror what the glucoses are doing. He will limit his intake of sugars and carbs, and we will repeat an A1c and lipid panel in 90 days. Second he has bilateral cerumen impactions. After informed consent was obtained, these were irrigated clear with water. He tolerated the procedure well. Afterwards the canals were clear on re-exam and he said he could hear better. Lastly I told him the penile  lesion is not due to Herpes, so he can stop using the medications he has been trying. We will refer him to Dermatology to evaluate.  [name], MD

## 2022-03-30 ENCOUNTER — Encounter: Payer: Self-pay | Admitting: Family Medicine

## 2022-04-10 ENCOUNTER — Other Ambulatory Visit: Payer: Self-pay | Admitting: Family Medicine

## 2022-05-08 ENCOUNTER — Other Ambulatory Visit: Payer: Self-pay | Admitting: Cardiology

## 2022-05-21 DIAGNOSIS — L821 Other seborrheic keratosis: Secondary | ICD-10-CM | POA: Diagnosis not present

## 2022-05-21 DIAGNOSIS — D225 Melanocytic nevi of trunk: Secondary | ICD-10-CM | POA: Diagnosis not present

## 2022-05-21 DIAGNOSIS — L814 Other melanin hyperpigmentation: Secondary | ICD-10-CM | POA: Diagnosis not present

## 2022-06-20 ENCOUNTER — Telehealth: Payer: Self-pay

## 2022-06-20 NOTE — Progress Notes (Signed)
Care Management & Coordination Services Pharmacy Team  Reason for Encounter: Hypertension  Contacted patient to discuss hypertension disease state. Spoke with patient on 06/20/2022     Current antihypertensive regimen:  Amlodipine 10 mg daily Losartan 50 mg daily Patient verbally confirms he is taking the above medications as directed. Yes  How often are you checking your Blood Pressure? Patient is checking his blood pressures about once a month.   he checks his blood pressure at various times  Current home BP readings: Patient doesn't keep a log of his blood pressures, he states his last readings was around 138/78  Wrist or arm cuff: Arm cuff  OTC medications including pseudoephedrine or NSAIDs? Patient denies  Any readings above 180/100? No  What recent interventions/DTPs have been made by any provider to improve Blood Pressure control since last CPP Visit: No recent interventions  Any recent hospitalizations or ED visits since last visit with CPP?  No recent hospital visits.   What diet changes have been made to improve Blood Pressure Control?  Patient follows no specific way of eating Breakfast - toast or eggs Lunch - sandwich of some sort Dinner - a meat, vegetable and starch Caffeine intake - he will have an occasional tea of soda Salt intake - doesn't add salt often  What exercise is being done to improve your Blood Pressure Control?  Patient stays active all day  Adherence Review: Is the patient currently on ACE/ARB medication? Yes Does the patient have >5 day gap between last estimated fill dates? No  Care Gaps: AWV - completed 12/29/2021 Last eye exam - never done Last foot exam - never done Last BP - 124/76 on 03/26/2022 Last A1C - 6.6 on 03/19/2022 Shingrix - never done Urine ACR - overdue Covid - overdue  Star Rating Drugs: Atorvastatin  20 mg - last filled 05/08/2022 90 DS at [facility] verified with pharm tech Losartan 50 mg - last filled  04/11/2022 90 DS at [facility]  Chart Updates: Recent office visits:  03/26/2022 Gershon Crane MD - Patient was seen for prediabetes and additional concerns. No medication changes.   03/19/2022 Gershon Crane MD - Patient was seen for essential hypertension and additional concerns. Started Valacyclovir 1000 mg twice daily prn. Discontinued Sildenafil.   Recent consult visits:  None  Hospital visits:  None  Medications: Outpatient Encounter Medications as of 06/20/2022  Medication Sig   albuterol (VENTOLIN HFA) 108 (90 Base) MCG/ACT inhaler Inhale 2 puffs into the lungs every 6 hrs as needed for Wheezing or SOB   amLODipine (NORVASC) 10 MG tablet TAKE 1 TABLET DAILY   apixaban (ELIQUIS) 5 MG TABS tablet Take 1 tablet (5 mg total) by mouth 2 (two) times daily.   atorvastatin (LIPITOR) 20 MG tablet TAKE ONE TABLET BY MOUTH DAILY   losartan (COZAAR) 50 MG tablet TAKE 1 TABLET DAILY   meclizine (ANTIVERT) 25 MG tablet Take 1 tablet (25 mg total) by mouth every 6 (six) hours as needed for dizziness.   metoprolol tartrate (LOPRESSOR) 25 MG tablet Take 0.5 tablets (12.5 mg total) by mouth 2 (two) times daily.   valACYclovir (VALTREX) 1000 MG tablet Take 1 tablet (1,000 mg total) by mouth 2 (two) times daily as needed (breakouts).   No facility-administered encounter medications on file as of 06/20/2022.  Fill History:  Dispensed Days Supply Quantity Provider Pharmacy  albuterol sulfate HFA 90 mcg/actuation aerosol inhaler 05/26/2021 30 8.5 g      Dispensed Days Supply Quantity Provider Pharmacy  AMLODIPINE  TAB 05/28/2022 90 90 tablet      Dispensed Days Supply Quantity Provider Pharmacy  ELIQUIS      TAB  02/22/2022 90 180 each      Dispensed Days Supply Quantity Provider Pharmacy  atorvastatin 20 mg tablet 05/08/2022 90 90 each      Dispensed Days Supply Quantity Provider Pharmacy  LOSARTAN  TAB 04/11/2022 90 90 tablet      Dispensed Days Supply Quantity Provider Pharmacy   METOPROL TAR  TAB 06/04/2022 90 90 tablet      Dispensed Days Supply Quantity Provider Pharmacy  valacyclovir 1 gram tablet 03/19/2022 30 60 each     Recent Office Vitals: BP Readings from Last 3 Encounters:  03/26/22 124/76  03/19/22 120/70  01/31/22 110/70   Pulse Readings from Last 3 Encounters:  03/26/22 (!) 58  03/19/22 (!) 56  01/31/22 (!) 56    Wt Readings from Last 3 Encounters:  03/26/22 185 lb (83.9 kg)  03/19/22 185 lb (83.9 kg)  01/31/22 188 lb (85.3 kg)     Kidney Function Lab Results  Component Value Date/Time   CREATININE 1.02 03/19/2022 10:58 AM   CREATININE 1.08 03/17/2021 10:49 AM   GFR 71.64 03/19/2022 10:58 AM   GFRNONAA >60 10/11/2020 12:33 PM   GFRAA >60 04/19/2018 03:24 PM       Latest Ref Rng & Units 03/19/2022   10:58 AM 03/17/2021   10:49 AM 10/11/2020   12:33 PM  BMP  Glucose 70 - 99 mg/dL 161  99  096   BUN 6 - 23 mg/dL Creatinine 0.40 - 1.50 mg/dL 0.45  4.09  8.11   Sodium 135 - 145 mEq/L 140  141  140   Potassium 3.5 - 5.1 mEq/L 4.1  4.0  4.1   Chloride 96 - 112 mEq/L 106  104  106   CO2 19 - 32 mEq/L Calcium 8.4 - 10.5 mg/dL 9.1  9.0  8.6    Inetta Fermo [facility]  Clinical Pharmacist Assistant [phone]

## 2022-08-07 IMAGING — DX DG CHEST 2V
2 series · 2 of 2 positions shown · non-contrast
Comparison: Prior chest radiographs 09/25/2016 and earlier.

CLINICAL DATA: Provided history: Tachycardia, atrial fibrillation.

EXAM:
CHEST - 2 VIEW

[chest pa]
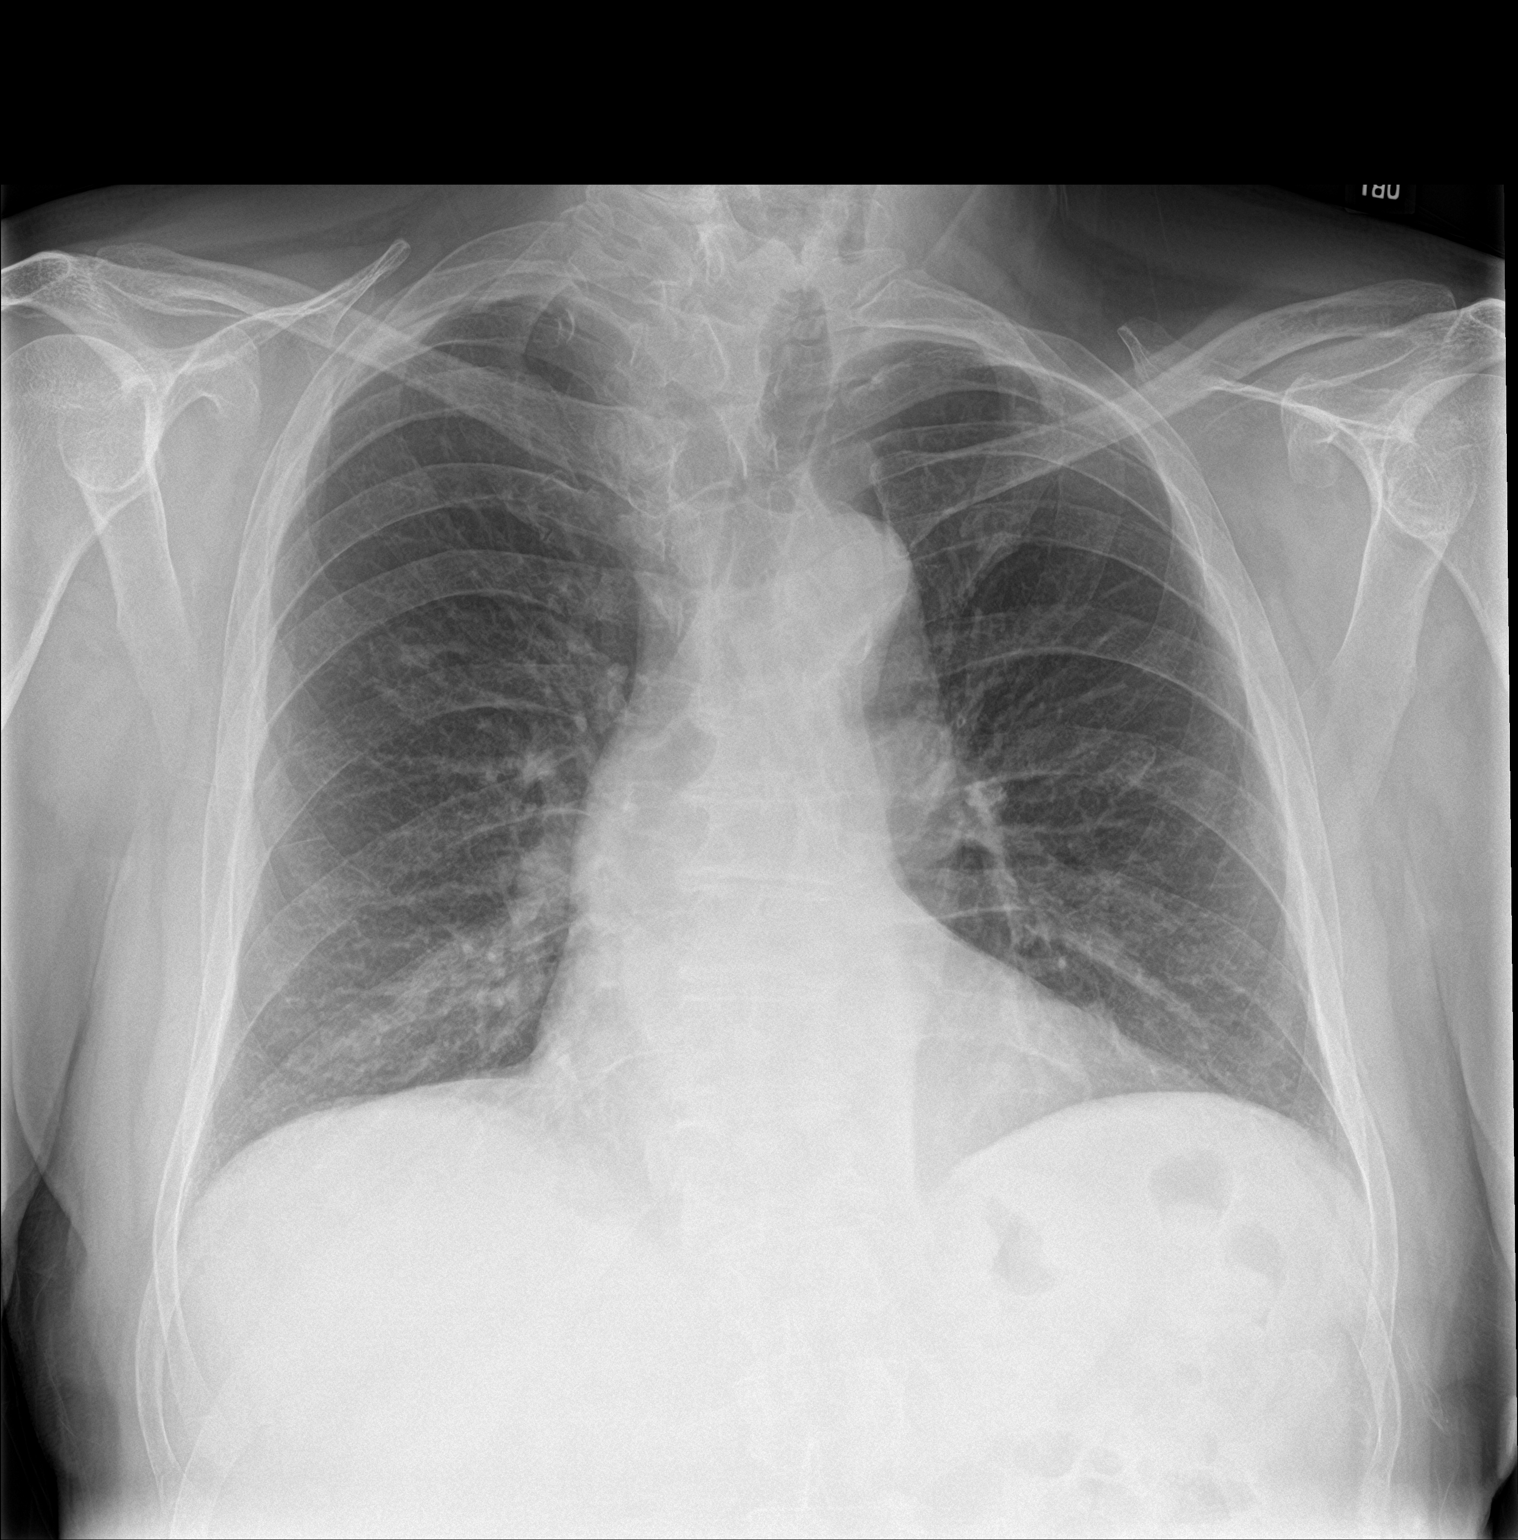

[chest lat]
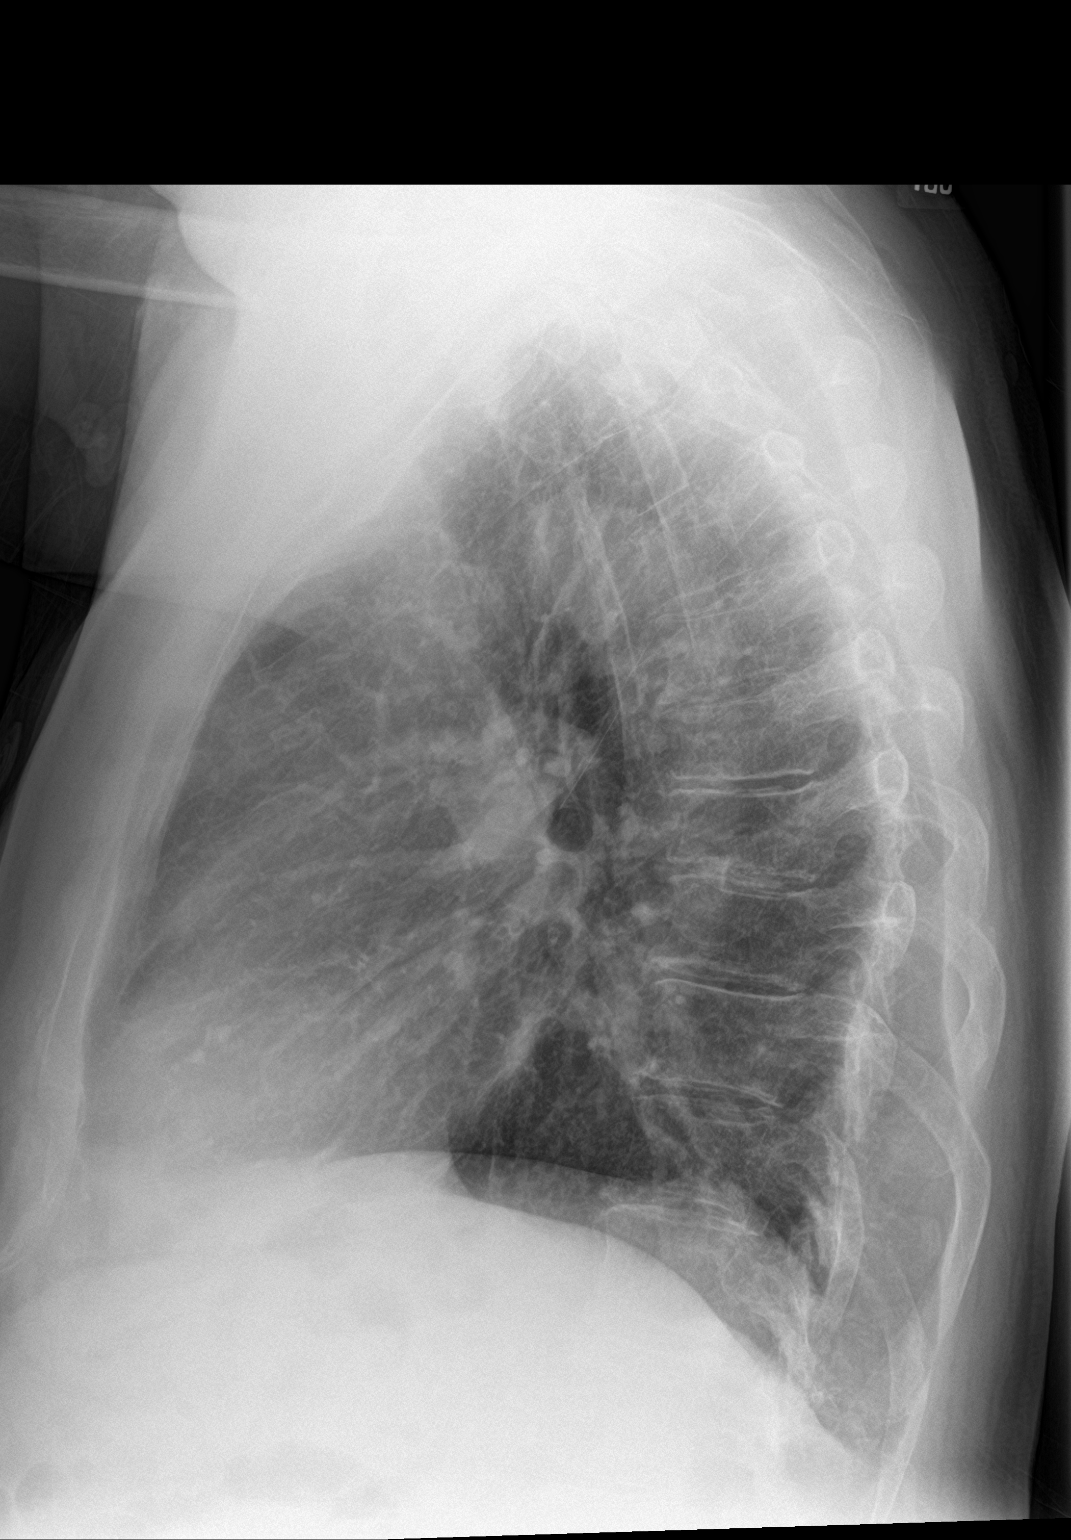

[2 of 2 positions shown; findings below may reference images not displayed]

FINDINGS: Heart size within normal limits. Aortic atherosclerosis. Shallow
inspiration radiograph with minimal right basilar subsegmental
atelectasis. No appreciable airspace consolidation or pulmonary
edema. No evidence of pleural effusion or pneumothorax. No acute
bony abnormality identified.
IMPRESSION: Shallow inspiration radiograph with mild right basilar subsegmental
atelectasis.

Aortic Atherosclerosis ([id number].B).

## 2022-08-21 DIAGNOSIS — H2513 Age-related nuclear cataract, bilateral: Secondary | ICD-10-CM | POA: Diagnosis not present

## 2022-08-31 DIAGNOSIS — N5201 Erectile dysfunction due to arterial insufficiency: Secondary | ICD-10-CM | POA: Diagnosis not present

## 2022-08-31 DIAGNOSIS — R3915 Urgency of urination: Secondary | ICD-10-CM | POA: Diagnosis not present

## 2022-08-31 DIAGNOSIS — N401 Enlarged prostate with lower urinary tract symptoms: Secondary | ICD-10-CM | POA: Diagnosis not present

## 2022-10-03 ENCOUNTER — Ambulatory Visit (INDEPENDENT_AMBULATORY_CARE_PROVIDER_SITE_OTHER): Payer: Medicare Other | Admitting: Family Medicine

## 2022-10-03 ENCOUNTER — Encounter: Payer: Self-pay | Admitting: Family Medicine

## 2022-10-03 VITALS — BP 120/78 | HR 51 | Temp 98.6°F | Resp 16 | Ht 65.25 in | Wt 182.2 lb

## 2022-10-03 DIAGNOSIS — R5383 Other fatigue: Secondary | ICD-10-CM

## 2022-10-03 DIAGNOSIS — I480 Paroxysmal atrial fibrillation: Secondary | ICD-10-CM

## 2022-10-03 DIAGNOSIS — I10 Essential (primary) hypertension: Secondary | ICD-10-CM

## 2022-10-03 DIAGNOSIS — R1310 Dysphagia, unspecified: Secondary | ICD-10-CM | POA: Diagnosis not present

## 2022-10-03 DIAGNOSIS — E042 Nontoxic multinodular goiter: Secondary | ICD-10-CM | POA: Diagnosis not present

## 2022-10-03 MED ORDER — LOSARTAN POTASSIUM 50 MG PO TABS: 25.0000 mg | ORAL_TABLET | Freq: Every day | ORAL | Status: DC

## 2022-10-03 NOTE — Progress Notes (Signed)
   Subjective:    Patient ID: Luke [name], male    DOB: Nov 09, 1945, 77 y.o.   MRN: [id number]  HPI Here for several issues. First for the past year he has had intermittent trouble swallowing. Food often seems to get stuck in the back of his throat for a few minutes before it then move down. No pain with swallowing. Sometimes this chokes him, and a few times he has vomited the food back up. He asks if his thyroid gland could be causing this. Of note he had a thyroid US in March 2020 that showed a 1.9 cm nodule on the isthmus and a 3.0 cm nodule on the right lobe. In June 2020 the largest nodule was biopsied, and it was revealed to a benign follicular nodule. He thinks the nodules have grown slightly larger since then. In fact he has discussed this with his doctor at the [facility] clinic in [place], and he is scheduled to have another thyroid US there on 10-23-22. The last issue he raises is some mild generalized fatigue that he feels at times. He thinks this is a medication side effect. His BP at home averages in the 120's over 70's, and his heart rate is usually in the 50's. He wants to decrease the medications if possible. He has not had any atrial fibrillation for the past 15 months.    Review of Systems  Constitutional:  Positive for fatigue.  HENT:  Positive for trouble swallowing.   Respiratory: Negative.    Cardiovascular: Negative.        Objective:   Physical Exam Constitutional:      Appearance: Normal appearance. He is not ill-appearing.  Neck:     Comments: The thyroid is normal on exam Cardiovascular:     Rate and Rhythm: Normal rate and regular rhythm.     Pulses: Normal pulses.     Heart sounds: Normal heart sounds.  Pulmonary:     Effort: Pulmonary effort is normal.     Breath sounds: Normal breath sounds.  Musculoskeletal:     Cervical back: Neck supple.  Lymphadenopathy:     Cervical: No cervical adenopathy.  Neurological:     Mental Status: He is alert.            Assessment & Plan:  For the thyroid nodules, he will get the [place] at the [facility] clinic as above. I asked him to make sure we get a copy of this report. For the dysphagia, I do not think his thyroid is large enough to cause this. He more likely may have a stenosis of the esophagus. We will refer him to see his GI provider, Dr. Charlott Rakes, to evaluate this. I think he will need an upper endoscopy. As for the fatigue, we will decrease the Losartan to 1/2 a tablet (25 mg) daily. I think the Metoprolol is the most likely culprit but I am hesitant to stop this until he sees Dr. Antoine Poche again in September.  Gershon Crane, MD

## 2022-11-06 ENCOUNTER — Other Ambulatory Visit: Payer: Self-pay | Admitting: Cardiology

## 2022-11-20 DIAGNOSIS — H2513 Age-related nuclear cataract, bilateral: Secondary | ICD-10-CM | POA: Diagnosis not present

## 2022-11-26 ENCOUNTER — Ambulatory Visit: Payer: Medicare Other

## 2022-11-26 DIAGNOSIS — R7309 Other abnormal glucose: Secondary | ICD-10-CM | POA: Insufficient documentation

## 2022-11-26 NOTE — Progress Notes (Unsigned)
Cardiology Office Note:   Date:  11/27/2022  ID:  Luke [name], Luke [name] [birth year], MRN [id number] PCP: Nelwyn Salisbury, MD  [facility] Providers Cardiologist:  Rollene Rotunda, MD {  History of Present Illness:   Luke [name] is a 77 y.o. male who presents for follow up of atrial fib.    He was found to have atrial fib in the context of a URI.  He had a negative ischemia work up and unremarkable echo  He did not want to continue with the DOAC.   He was in the hospital with a CVA.   This appeared to be embolic. He was subsequently put on Eliquis.  He was in the emergency room in August 2022 with atrial fibrillation.  He left without being seen.   While he was waiting in the emergency room he went back into sinus rhythm .  He had another episode of atrial fibrillation in October 2022.      He presents for follow up.  He said that he had a couple of episodes in April 2023.  Most recently had an episode few days ago that lasted for about 24 hours.  He feels as if it is soon as he goes into it.  He started taking 25 mg of metoprolol every 5 hours and initially it slowed his heart rate but still stayed in around the 120 range.  He did not have chest pressure, neck or arm discomfort.  He was not short of breath.  He did not have any presyncope or syncope.  He was not sure whether there was any trigger although he been dancing a lot.  He has noticed in the past that alcohol was triggered so he stopped doing this.  Eventually he popped back into sinus rhythm.  He did not present to the emergency room.  He feels well now.  ROS: As stated in the HPI and negative for all other systems.  Studies Reviewed:    EKG:   EKG Interpretation Date/Time:  Tuesday November 27 2022 15:47:31 EDT Ventricular Rate:  60 PR Interval:  146 QRS Duration:  144 QT Interval:  464 QTC Calculation: 464 R Axis:   3  Text Interpretation: Normal sinus rhythm Right bundle branch block When compared with ECG of  11-Oct-2020 15:50, No significant change since last tracing Confirmed by Rollene Rotunda ([id number]) on 11/27/2022 4:25:42 PM    Risk Assessment/Calculations:    CHA2DS2-VASc Score = 6   This indicates a 9.7% annual risk of stroke. The patient's score is based upon: CHF History: 0 HTN History: 1 Diabetes History: 1 Stroke History: 2 Vascular Disease History: 0 Age Score: 2 Gender Score: 0   Physical Exam:   VS:  BP 136/64   Pulse 60   Ht 5\' 7"  (1.702 m)   Wt 188 lb (85.3 kg)   SpO2 98%   BMI 29.44 kg/m    Wt Readings from Last 3 Encounters:  11/27/22 188 lb (85.3 kg)  10/03/22 182 lb 4 oz (82.7 kg)  03/26/22 185 lb (83.9 kg)     GEN: Well nourished, well developed in no acute distress NECK: No JVD; No carotid bruits CARDIAC: RRR, no murmurs, rubs, gallops RESPIRATORY:  Clear to auscultation without rales, wheezing or rhonchi  ABDOMEN: Soft, non-tender, non-distended EXTREMITIES:  Mild to moderate bilateral leg edema; No deformity   ASSESSMENT AND PLAN:   ATRIAL FIB:  We had a long conversation about this.  At this point his episodes  are infrequent enough that I would not suggest ablation.  He tolerates anticoagulation.  No change in therapy.     HTN: The blood pressure is at target.  No change in therapy.   CAROTID DOPPLER:   He has had moderate right greater than left carotid stenosis and has is followed at the [place].  He says it has been stable.   DYSLIPDEMIA:   LDL was 68 with an HDL of 36.6.  No change in therapy.   ELEVATED A1C: This was 6.6 and is followed by Nelwyn Salisbury, MD  EDEMA: I have suggested compression stockings       Follow up with me in one year.   Signed, Rollene Rotunda, MD

## 2022-11-27 ENCOUNTER — Ambulatory Visit: Payer: Medicare Other | Attending: Cardiology | Admitting: Cardiology

## 2022-11-27 ENCOUNTER — Encounter: Payer: Self-pay | Admitting: Cardiology

## 2022-11-27 VITALS — BP 136/64 | HR 60 | Ht 67.0 in | Wt 188.0 lb

## 2022-11-27 DIAGNOSIS — E785 Hyperlipidemia, unspecified: Secondary | ICD-10-CM | POA: Insufficient documentation

## 2022-11-27 DIAGNOSIS — I480 Paroxysmal atrial fibrillation: Secondary | ICD-10-CM | POA: Insufficient documentation

## 2022-11-27 DIAGNOSIS — R7309 Other abnormal glucose: Secondary | ICD-10-CM | POA: Diagnosis not present

## 2022-11-27 DIAGNOSIS — I10 Essential (primary) hypertension: Secondary | ICD-10-CM | POA: Diagnosis not present

## 2022-11-27 MED ORDER — ATORVASTATIN CALCIUM 20 MG PO TABS: 20.0000 mg | ORAL_TABLET | Freq: Every day | ORAL | 3 refills | Status: DC

## 2022-11-27 NOTE — Patient Instructions (Signed)
Medication Instructions:   No changes - medication refilled  *If you need a refill on your cardiac medications before your next appointment, please call your pharmacy*   Lab Work:    Not needed   Testing/Procedures: Not needed   Follow-Up: At [facility], you and your health needs are our priority.  As part of our continuing mission to provide you with exceptional heart care, we have created designated Provider Care Teams.  These Care Teams include your primary Cardiologist (physician) and Advanced Practice Providers (APPs -  Physician Assistants and Nurse Practitioners) who all work together to provide you with the care you need, when you need it.  We recommend signing up for the patient portal called "MyChart".  Sign up information is provided on this After Visit Summary.  MyChart is used to connect with patients for Virtual Visits (Telemedicine).  Patients are able to view lab/test results, encounter notes, upcoming appointments, etc.  Non-urgent messages can be sent to your provider as well.   To learn more about what you can do with MyChart, go to [email or web address].    Your next appointment:   12 month(s)  The format for your next appointment:   In Person  Provider:   Rollene Rotunda, MD    Other Instructions   recommends you purchase some compression  socks/hose from [employer] in [place] ,[place]. You do not need an prescription to purchase the items.  Address  [address] [place], [place] [address]  Phone  [phone]   Compression   strength    X8-15 mmHg X15-20 mmHg                           20-30 mmHg  30-40 mmHg.  You may also try a medical supply store, department store (i.e.- [employer], [employer], [employer], specialty shoe stores ( shoe market), Engineer, civil (consulting) uniform store.

## 2022-12-03 ENCOUNTER — Ambulatory Visit (INDEPENDENT_AMBULATORY_CARE_PROVIDER_SITE_OTHER): Payer: Medicare Other

## 2022-12-03 DIAGNOSIS — Z23 Encounter for immunization: Secondary | ICD-10-CM | POA: Diagnosis not present

## 2022-12-04 ENCOUNTER — Other Ambulatory Visit: Payer: Self-pay | Admitting: Family Medicine

## 2022-12-07 ENCOUNTER — Telehealth: Payer: Self-pay | Admitting: Cardiology

## 2022-12-07 DIAGNOSIS — R1310 Dysphagia, unspecified: Secondary | ICD-10-CM | POA: Diagnosis not present

## 2022-12-07 NOTE — Telephone Encounter (Signed)
Name: Luke [name]  DOB: [birth year]  MRN: [id number]   Primary Cardiologist: Rollene Rotunda, MD  Chart reviewed as part of pre-operative protocol coverage. Patient was contacted 12/07/2022 in reference to pre-operative risk assessment for pending surgery as outlined below.  Luke [name] was last seen on 11/27/2022 by Dr. Antoine Poche-.  Since that day, Luke [name] has done   Patient had presumed embolic stroke prior to being tx with Eliquis.    Per office protocol, patient can hold Eliquis for 1-2 days prior to procedure.    Therefore, based on ACC/AHA guidelines, the patient would be at acceptable risk for the planned procedure without further cardiovascular testing.   The patient was advised that if he develops new symptoms prior to surgery to contact our office to arrange for a follow-up visit, and he verbalized understanding.  I will route this recommendation to the requesting party via Epic fax function and remove from pre-op pool. Please call with questions.  Joni Reining, NP 12/07/2022, 4:09 PM

## 2022-12-07 NOTE — Telephone Encounter (Signed)
Patient with diagnosis of afib on Eliquis for anticoagulation.    Procedure: endoscopy Date of procedure: 01/02/23   CHA2DS2-VASc Score = 6   This indicates a 9.7% annual risk of stroke. The patient's score is based upon: CHF History: 0 HTN History: 1 Diabetes History: 1 Stroke History: 2 Vascular Disease History: 0 Age Score: 2 Gender Score: 0       Patient had presumed embolic stroke prior to being tx with Eliquis.   CrCl 64 ml/min Platelet count 325  Per office protocol, patient can hold Eliquis for 1-2 days prior to procedure.    **This guidance is not considered finalized until pre-operative APP has relayed final recommendations.**

## 2022-12-07 NOTE — Telephone Encounter (Signed)
Pre-operative Risk Assessment    Patient Name: Luke [name]  DOB: Jul 26, 1945 MRN: [id number]  Last visit 11-27-22 Next visit None      Request for Surgical Clearance    Procedure:   endoscopy  Date of Surgery:  Clearance 01/02/23                                 Surgeon:  Dr. Charlott Rakes Surgeon's Group or Practice Name:  [facility] Phone number:  [phone] Fax number:  [phone]   Type of Clearance Requested:   - Medical  - Pharmacy:  Hold Apixaban (Eliquis) hold time not indicated   Type of Anesthesia:   propofol   Additional requests/questions:    [name]   12/07/2022, 12:08 PM

## 2022-12-07 NOTE — Telephone Encounter (Signed)
Pharmacy please advise on holding Eliquis prior to colonoscopy scheduled for 01/02/2023   Just saw Dr. Antoine Poche on 11/27/2022 will likely just need phone call clearance.  . Thank you.

## 2023-01-02 DIAGNOSIS — R1310 Dysphagia, unspecified: Secondary | ICD-10-CM | POA: Diagnosis not present

## 2023-01-02 DIAGNOSIS — K2100 Gastro-esophageal reflux disease with esophagitis, without bleeding: Secondary | ICD-10-CM | POA: Diagnosis not present

## 2023-01-02 DIAGNOSIS — K222 Esophageal obstruction: Secondary | ICD-10-CM | POA: Diagnosis not present

## 2023-01-02 DIAGNOSIS — K317 Polyp of stomach and duodenum: Secondary | ICD-10-CM | POA: Diagnosis not present

## 2023-01-02 DIAGNOSIS — K2930 Chronic superficial gastritis without bleeding: Secondary | ICD-10-CM | POA: Diagnosis not present

## 2023-01-03 ENCOUNTER — Encounter: Payer: Self-pay | Admitting: Family Medicine

## 2023-01-03 ENCOUNTER — Ambulatory Visit: Payer: Medicare Other | Admitting: Family Medicine

## 2023-01-03 DIAGNOSIS — Z0000 Encounter for general adult medical examination without abnormal findings: Secondary | ICD-10-CM | POA: Diagnosis not present

## 2023-01-03 NOTE — Progress Notes (Signed)
Patient unable to check vitals at home for virtual visit.

## 2023-01-03 NOTE — Progress Notes (Signed)
PATIENT CHECK-IN and HEALTH RISK ASSESSMENT QUESTIONNAIRE:  -completed by phone/video for upcoming Medicare Preventive Visit  Pre-Visit Check-in: 1)Vitals (height, wt, BP, etc) - record in vitals section for visit on day of visit Request home vitals (wt, BP, etc.) and enter into vitals, THEN update Vital Signs SmartPhrase below at the top of the HPI. See below.  2)Review and Update Medications, Allergies PMH, Surgeries, Social history in Epic 3)Hospitalizations in the last year with date/reason? no  4)Review and Update Care Team (patient's specialists) in Epic 5) Complete PHQ9 in Epic  6) Complete Fall Screening in Epic 7)Review all Health Maintenance Due and order under PCP if not done.  Medicare Wellness Patient Questionnaire:  Answer theses question about your habits: Do you drink alcohol? no If yes, how many drinks do you have a day?N/A Have you ever smoked?no Quit date if applicable? N/A  How many packs a day do/did you smoke? N/A Do you use smokeless tobacco?no Do you use an illicit drugs?no Do you exercises? Yes IF so, what type and how many days/minutes per week?2-3 days per week - walking usually goes to [employer] or [employer] or [employer], usually about 15-20 minutes.  Are you sexually active? Yes Number of partners?1 Typical breakfast-varies from oatmeal, grits, egg McMuffin Typical lunch-varies-usually sandwich, soup Typical dinner-varies Typical snacks: nuts  Beverages: decaf coffee, water  Answer theses question about you: Can you perform most household chores?yes Do you find it hard to follow a conversation in a noisy room? sometimes Do you often ask people to speak up or repeat themselves?not often Do you feel that you have a problem with memory?no Do you balance your checkbook and or bank acounts?yes Do you feel safe at home?yes Last dentist visit?September Do you need assistance with any of the following: Please note if so :  Driving? no  Feeding yourself?  no  Getting from bed to chair? no  Getting to the toilet? no  Bathing or showering? no  Dressing yourself? no  Managing money? no  Climbing a flight of stairs-no  Preparing meals? no    Do you have Advanced Directives in place (Living Will, Healthcare Power or Leon)? Yes-living will   Last eye Exam and location?Spring 2024-Dr Hutto - already done, staff to obtain report   Do you currently use prescribed or non-prescribed narcotic or opioid pain medications?no  Do you have a history or close family history of breast, ovarian, tubal or peritoneal cancer or a family member with BRCA (breast cancer susceptibility 1 and 2) gene mutations? no  Request home vitals (wt, BP, etc.) and enter into vitals, THEN update Vital Signs SmartPhrase below at the top of the HPI. See below.   Nurse/Assistant Credentials/time stamp: Ronnald Collum [facility]   ----------------------------------------------------------------------------------------------------------------------------------------------------------------------------------------------------------------------  Because this visit was a virtual/telehealth visit, some criteria may be missing or patient reported. Any vitals not documented were not able to be obtained and vitals that have been documented are patient reported.    MEDICARE ANNUAL PREVENTIVE CARE VISIT WITH PROVIDER (Welcome to Medicare, initial annual wellness or annual wellness exam)  Virtual Visit via Video Note  I connected with Luke [name] on 01/03/23  by a video enabled telemedicine application and verified that I am speaking with the correct person using two identifiers.  Location patient: home Location provider:work or home office Persons participating in the virtual visit: patient, provider  Concerns and/or follow up today: reports all is stable. Had one a. Fib attack in August and saw his cardiologist -  doing well since.    See HM section in Epic for other  details of completed HM.    ROS: negative for report of fevers, unintentional weight loss, vision changes, vision loss, hearing loss or change, chest pain, sob, hemoptysis, melena, hematochezia, hematuria, falls, bleeding or bruising, thoughts of suicide or self harm, memory loss  Patient-completed extensive health risk assessment - reviewed and discussed with the patient: See Health Risk Assessment completed with patient prior to the visit either above or in recent phone note. This was reviewed in detailed with the patient today and appropriate recommendations, orders and referrals were placed as needed per Summary below and patient instructions.   Review of Medical History: -PMH, PSH, Family History and current specialty and care providers reviewed and updated and listed below   Patient Care Team: Nelwyn Salisbury, MD as PCP - General (Family Medicine) Rollene Rotunda, MD as PCP - Cardiology (Cardiology) Verner Chol, [facility] (Inactive) as Pharmacist (Pharmacist)   Past Medical History:  Diagnosis Date   Asthma    Depression    ED (erectile dysfunction)    Hyperlipidemia    Hypertension    Paroxysmal atrial fibrillation (HCC) 09/2016   Initial diagnosis was in setting of acute URI/asthma attack   Solitary pulmonary nodule 04/16/2013   03/30/13  CXR Question left nipple shadow.  10 mm ovoid nodular density right upper lobe, cannot exclude  pulmonary mass/ nodule > f/u cxr 04/29/2013 > no nodule     Stroke [facility])    Ulcer     Past Surgical History:  Procedure Laterality Date   COLONOSCOPY  05/15/2019   per Dr. Charlott Rakes, clear, no repeats needed    NM MYOVIEW LTD  10/2016   ailable, images/films reviewed: From Epic Chart or Care Everywhere)   TRANSTHORACIC ECHOCARDIOGRAM  09/2016   Normal systolic function. EF 5560%. No RWMA. Gr 1 DD. Mild AoV calcification - no stenosis. Atrial Septum - lipomatous hypertrophy. --Essentially normal.    Social History   Socioeconomic  History   Marital status: Married    Spouse name: Not on file   Number of children: Not on file   Years of education: Not on file   Highest education level: Not on file  Occupational History   Occupation: Retired Magazine features editor: RETIRED  Tobacco Use   Smoking status: Never   Smokeless tobacco: Never  Vaping Use   Vaping status: Never Used  Substance and Sexual Activity   Alcohol use: Yes    Alcohol/week: 2.0 standard drinks of alcohol    Types: 2 Standard drinks or equivalent per week    Comment: socially occasionally    Drug use: No   Sexual activity: Not on file  Other Topics Concern   Not on file  Social History Narrative   Not on file   Social Determinants of Health   Financial Resource Strain: Low Risk  (01/03/2023)   Overall Financial Resource Strain (CARDIA)    Difficulty of Paying Living Expenses: Not hard at all  Food Insecurity: No Food Insecurity (01/03/2023)   Hunger Vital Sign    Worried About Running Out of Food in the Last Year: Never true    Ran Out of Food in the Last Year: Never true  Transportation Needs: No Transportation Needs (01/03/2023)   PRAPARE - Administrator, Civil Service (Medical): No    Lack of Transportation (Non-Medical): No  Physical Activity: Insufficiently Active (01/03/2023)   Exercise Vital Sign  Days of Exercise per Week: 2 days    Minutes of Exercise per Session: 20 min  Stress: No Stress Concern Present (01/03/2023)   [employer] of Occupational Health - Occupational Stress Questionnaire    Feeling of Stress : Not at all  Social Connections: Moderately Integrated (01/03/2023)   Social Connection and Isolation Panel [NHANES]    Frequency of Communication with Friends and Family: More than three times a week    Frequency of Social Gatherings with Friends and Family: Twice a week    Attends Religious Services: More than 4 times per year    Active Member of [employer] or Organizations: No    Attends Tax inspector Meetings: Never    Marital Status: Married  Catering manager Violence: Not At Risk (01/03/2023)   Humiliation, Afraid, Rape, and Kick questionnaire    Fear of Current or Ex-Partner: No    Emotionally Abused: No    Physically Abused: No    Sexually Abused: No    Family History  Problem Relation Age of Onset   Stroke Mother    Hypertension Mother    Peripheral vascular disease Mother    Hyperlipidemia Father    Coronary artery disease Father    Peripheral vascular disease Father    Hypertension Brother    Coronary artery disease Brother    Cancer Brother        throat, smoked   Emphysema Brother        smoked   Lung cancer Brother        smoked    Current Outpatient Medications on File Prior to Visit  Medication Sig Dispense Refill   albuterol (VENTOLIN HFA) 108 (90 Base) MCG/ACT inhaler Inhale 2 puffs into the lungs every 6 hrs as needed for Wheezing or SOB 18 g 3   amLODipine (NORVASC) 10 MG tablet TAKE 1 TABLET DAILY 90 tablet 3   atorvastatin (LIPITOR) 20 MG tablet Take 1 tablet (20 mg total) by mouth daily. Please keep scheduled appointment for future refills. Thank you. 90 tablet 3   losartan (COZAAR) 50 MG tablet TAKE 1 TABLET DAILY 90 tablet 1   meclizine (ANTIVERT) 25 MG tablet Take 1 tablet (25 mg total) by mouth every 6 (six) hours as needed for dizziness. 60 tablet 2   metoprolol tartrate (LOPRESSOR) 25 MG tablet Take 0.5 tablets (12.5 mg total) by mouth 2 (two) times daily. 90 tablet 3   valACYclovir (VALTREX) 1000 MG tablet Take 1 tablet (1,000 mg total) by mouth 2 (two) times daily as needed (breakouts). 60 tablet 5   apixaban (ELIQUIS) 5 MG TABS tablet Take 1 tablet (5 mg total) by mouth 2 (two) times daily. 60 tablet 0   No current facility-administered medications on file prior to visit.    Allergies  Allergen Reactions   Aspirin Other (See Comments)    REACTION: ULCER @LARGE  DOSES  Other Reaction(s): gi upset   Hydrochlorothiazide Other  (See Comments)    Leg cramps   Irbesartan Other (See Comments)    Headaches  Other Reaction(s): excessive cough   Symbicort [Budesonide-Formoterol Fumarate] Other (See Comments)    legcramps       Physical Exam Vitals requested from patient and listed below if patient had equipment and was able to obtain at home for this virtual visit: There were no vitals filed for this visit. Estimated body mass index is 29.44 kg/m as calculated from the following:   Height as of 11/27/22: 5\' 7"  (1.702 m).  Weight as of 11/27/22: 188 lb (85.3 kg).  EKG (optional): deferred due to virtual visit  GENERAL: alert, oriented, no acute distress detected; full vision exam deferred due to pandemic and/or virtual encounter  HEENT: atraumatic, conjunttiva clear, no obvious abnormalities on inspection of external nose and ears  NECK: normal movements of the head and neck  LUNGS: on inspection no signs of respiratory distress, breathing rate appears normal, no obvious gross SOB, gasping or wheezing  CV: no obvious cyanosis  MS: moves all visible extremities without noticeable abnormality  PSYCH/NEURO: pleasant and cooperative, no obvious depression or anxiety, speech and thought processing grossly intact, Cognitive function grossly intact  Flowsheet Row Office Visit from 01/03/2023 in [facility] [place]  PHQ-9 Total Score 1           01/03/2023   10:37 AM 10/03/2022   10:58 AM 03/26/2022    1:40 PM 03/19/2022   10:23 AM 12/29/2021    2:02 PM  Depression screen PHQ 2/9  Decreased Interest 0 0 0 0 0  Down, Depressed, Hopeless 0 0 0 0 0  PHQ - 2 Score 0 0 0 0 0  Altered sleeping 0 0 0  0  Tired, decreased energy 1 2 0  0  Change in appetite 0 0 0  0  Feeling bad or failure about yourself  0 0 0  0  Trouble concentrating 0 0 0  0  Moving slowly or fidgety/restless 0 0 0  0  Suicidal thoughts 0 0 0  0  PHQ-9 Score 1 2 0  0  Difficult doing work/chores   Not difficult  at all  Not difficult at all       11/15/2021   11:14 AM 12/29/2021    2:04 PM 03/19/2022   10:22 AM 03/26/2022    1:40 PM 10/03/2022   10:58 AM  Fall Risk  Falls in the past year? 0 0 0 0 0  Was there an injury with Fall? 0 0 0 0 0  Fall Risk Category Calculator 0 0 0 0 0  Fall Risk Category (Retired) Low Low     (RETIRED) Patient Fall Risk Level Low fall risk Low fall risk     Patient at Risk for Falls Due to No Fall Risks No Fall Risks No Fall Risks No Fall Risks No Fall Risks  Fall risk Follow up Falls evaluation completed Falls prevention discussed Falls evaluation completed Falls evaluation completed Falls evaluation completed     SUMMARY AND PLAN:  Encounter for Medicare annual wellness exam    Discussed applicable health maintenance/preventive health measures and advised and referred or ordered per patient preferences: -had his flu shot and his shingles shot recently -plans to gets his labs at his physical with his PCP -reports eye exam done -discussed covid vaccine recommendations and can get at the pharmacy  Health Maintenance  Topic Date Due   FOOT EXAM  Never done   OPHTHALMOLOGY EXAM  Never done   Diabetic kidney evaluation - Urine ACR  10/28/2014   HEMOGLOBIN A1C  09/17/2022   COVID-19 Vaccine (5 - 2023-24 season) 11/04/2022   Zoster Vaccines- Shingrix (1 of 2) 02/25/2023 (Originally 03/15/1995)   Diabetic kidney evaluation - eGFR measurement  03/20/2023   Medicare Annual Wellness (AWV)  01/03/2024   Colonoscopy  05/14/2024   DTaP/Tdap/Td (3 - Td or Tdap) 11/28/2026   Pneumonia Vaccine 8+ Years old  Completed   INFLUENZA VACCINE  Completed   Hepatitis C  Screening  Completed   HPV VACCINES  Aged [employer] and counseling on the following was provided based on the above review of health and a plan/checklist for the patient, along with additional information discussed, was provided for the patient in the patient instructions :   -Advised and  counseled on a healthy lifestyle - including the importance of a healthy diet, regular physical activity, social connections  -Reviewed patient's current diet. Advised and counseled on a whole foods based healthy diet. A summary of a healthy diet was provided in the Patient Instructions.  -reviewed patient's current physical activity level and discussed exercise guidelines for adults. Discussed community resources and ideas for safe exercise at home to assist in meeting exercise guideline recommendations in a safe and healthy way.  -Advise yearly dental visits at minimum and regular eye exams   Follow up: see patient instructions   Patient Instructions  I really enjoyed getting to talk with you today! I am available on Tuesdays and Thursdays for virtual visits if you have any questions or concerns, or if I can be of any further assistance.   CHECKLIST FROM ANNUAL WELLNESS VISIT:  -Follow up (please call to schedule if not scheduled after visit):   -yearly for annual wellness visit with primary care office  Here is a list of your preventive care/health maintenance measures and the plan for each if any are due:  PLAN For any measures below that may be due:    Health Maintenance  Topic Date Due   FOOT EXAM  Never done   OPHTHALMOLOGY EXAM  Never done   Diabetic kidney evaluation - Urine ACR  10/28/2014   HEMOGLOBIN A1C  09/17/2022   COVID-19 Vaccine (5 - 2023-24 season) 11/04/2022   Zoster Vaccines- Shingrix (1 of 2) 02/25/2023 (Originally [birth year]1996)   Diabetic kidney evaluation - eGFR measurement  03/20/2023   Medicare Annual Wellness (AWV)  01/03/2024   Colonoscopy  05/14/2024   DTaP/Tdap/Td (3 - Td or Tdap) 11/28/2026   Pneumonia Vaccine 78+ Years old  Completed   INFLUENZA VACCINE  Completed   Hepatitis C Screening  Completed   HPV VACCINES  Aged Out    -See a dentist at least yearly  -Get your eyes checked and then per your eye specialist's recommendations  -Other issues  addressed today:   -I have included below further information regarding a healthy whole foods based diet, physical activity guidelines for adults, stress management and opportunities for social connections. I hope you find this information useful.   -----------------------------------------------------------------------------------------------------------------------------------------------------------------------------------------------------------------------------------------------------------  NUTRITION: -eat real food: lots of colorful vegetables (half the plate) and fruits -5-7 servings of vegetables and fruits per day (fresh or steamed is best), exp. 2 servings of vegetables with lunch and dinner and 2 servings of fruit per day. Berries and greens such as kale and collards are great choices.  -consume on a regular basis: whole grains (make sure first ingredient on label contains the word "whole"), fresh fruits, fish, nuts, seeds, healthy oils (such as olive oil, avocado oil, grape seed oil) -may eat small amounts of dairy and lean meat on occasion, but avoid processed meats such as ham, bacon, lunch meat, etc. -drink water -try to avoid fast food and pre-packaged foods, processed meat -most experts advise limiting sodium to < 2300mg  per day, should limit further is any chronic conditions such as high blood pressure, heart disease, diabetes, etc. The American Heart Association advised that < 1500mg  is is ideal -try to avoid foods  that contain any ingredients with names you do not recognize  -try to avoid sugar/sweets (except for the natural sugar that occurs in fresh fruit) -try to avoid sweet drinks -try to avoid white rice, white bread, pasta (unless whole grain), white or yellow potatoes  EXERCISE GUIDELINES FOR ADULTS: -if you wish to increase your physical activity, do so gradually and with the approval of your doctor -STOP and seek medical care immediately if you have any chest  pain, chest discomfort or trouble breathing when starting or increasing exercise  -move and stretch your body, legs, feet and arms when sitting for long periods -Physical activity guidelines for optimal health in adults: -least 150 minutes per week of aerobic exercise (can talk, but not sing) once approved by your doctor, 20-30 minutes of sustained activity or two 10 minute episodes of sustained activity every day.  -resistance training at least 2 days per week if approved by your doctor -balance exercises 3+ days per week:   Stand somewhere where you have something sturdy to hold onto if you lose balance.    1) lift up on toes, start with 5x per day and work up to 20x   2) stand and lift on leg straight out to the side so that foot is a few inches of the floor, start with 5x each side and work up to 20x each side   3) stand on one foot, start with 5 seconds each side and work up to 20 seconds on each side  If you need ideas or help with getting more active:  -Silver sneakers https://tools.silversneakers.com  -Walk with a Doc: http://www.duncan-williams.com/  -try to include resistance (weight lifting/strength building) and balance exercises twice per week: or the following link for ideas: http://castillo-powell.com/  BuyDucts.dk  STRESS MANAGEMENT: -can try meditating, or just sitting quietly with deep breathing while intentionally relaxing all parts of your body for 5 minutes daily -if you need further help with stress, anxiety or depression please follow up with your primary doctor or contact the wonderful folks at [facility]: [phone]  SOCIAL CONNECTIONS: -options in [place] if you wish to engage in more social and exercise related activities:  -Silver sneakers https://tools.silversneakers.com  -Walk with a Doc: http://www.duncan-williams.com/  -Check out the [facility] Active  Adults 50+ section on the [place] [employer] (hiking clubs, book clubs, cards and games, chess, exercise classes, aquatic classes and much more) - see the website for details: https://www.[place].gov/departments/parks-recreation/active-adults50  -YouTube has lots of exercise videos for different ages and abilities as well  -[facility] (a variety of indoor and outdoor inperson activities for adults). [phone]. [address].  -Virtual Online Classes (a variety of topics): see seniorplanet.org or call [phone]  -consider volunteering at a school, hospice center, church, senior center or elsewhere           Terressa Koyanagi, DO

## 2023-01-03 NOTE — Patient Instructions (Signed)
I really enjoyed getting to talk with you today! I am available on Tuesdays and Thursdays for virtual visits if you have any questions or concerns, or if I can be of any further assistance.   CHECKLIST FROM ANNUAL WELLNESS VISIT:  -Follow up (please call to schedule if not scheduled after visit):   -yearly for annual wellness visit with primary care office  Here is a list of your preventive care/health maintenance measures and the plan for each if any are due:  PLAN For any measures below that may be due:    Health Maintenance  Topic Date Due   FOOT EXAM  Never done   OPHTHALMOLOGY EXAM  Never done   Diabetic kidney evaluation - Urine ACR  10/28/2014   HEMOGLOBIN A1C  09/17/2022   COVID-19 Vaccine (5 - 2023-24 season) 11/04/2022   Zoster Vaccines- Shingrix (1 of 2) 02/25/2023 (Originally 03/15/1996)   Diabetic kidney evaluation - eGFR measurement  03/20/2023   Medicare Annual Wellness (AWV)  01/03/2024   Colonoscopy  05/14/2024   DTaP/Tdap/Td (3 - Td or Tdap) 11/28/2026   Pneumonia Vaccine 63+ Years old  Completed   INFLUENZA VACCINE  Completed   Hepatitis C Screening  Completed   HPV VACCINES  Aged Out    -See a dentist at least yearly  -Get your eyes checked and then per your eye specialist's recommendations  -Other issues addressed today:   -I have included below further information regarding a healthy whole foods based diet, physical activity guidelines for adults, stress management and opportunities for social connections. I hope you find this information useful.   -----------------------------------------------------------------------------------------------------------------------------------------------------------------------------------------------------------------------------------------------------------  NUTRITION: -eat real food: lots of colorful vegetables (half the plate) and fruits -5-7 servings of vegetables and fruits per day (fresh or steamed is best),  exp. 2 servings of vegetables with lunch and dinner and 2 servings of fruit per day. Berries and greens such as kale and collards are great choices.  -consume on a regular basis: whole grains (make sure first ingredient on label contains the word "whole"), fresh fruits, fish, nuts, seeds, healthy oils (such as olive oil, avocado oil, grape seed oil) -may eat small amounts of dairy and lean meat on occasion, but avoid processed meats such as ham, bacon, lunch meat, etc. -drink water -try to avoid fast food and pre-packaged foods, processed meat -most experts advise limiting sodium to < 2300mg  per day, should limit further is any chronic conditions such as high blood pressure, heart disease, diabetes, etc. The American Heart Association advised that < 1500mg  is is ideal -try to avoid foods that contain any ingredients with names you do not recognize  -try to avoid sugar/sweets (except for the natural sugar that occurs in fresh fruit) -try to avoid sweet drinks -try to avoid white rice, white bread, pasta (unless whole grain), white or yellow potatoes  EXERCISE GUIDELINES FOR ADULTS: -if you wish to increase your physical activity, do so gradually and with the approval of your doctor -STOP and seek medical care immediately if you have any chest pain, chest discomfort or trouble breathing when starting or increasing exercise  -move and stretch your body, legs, feet and arms when sitting for long periods -Physical activity guidelines for optimal health in adults: -least 150 minutes per week of aerobic exercise (can talk, but not sing) once approved by your doctor, 20-30 minutes of sustained activity or two 10 minute episodes of sustained activity every day.  -resistance training at least 2 days per week if approved by  your doctor -balance exercises 3+ days per week:   Stand somewhere where you have something sturdy to hold onto if you lose balance.    1) lift up on toes, start with 5x per day and work  up to 20x   2) stand and lift on leg straight out to the side so that foot is a few inches of the floor, start with 5x each side and work up to 20x each side   3) stand on one foot, start with 5 seconds each side and work up to 20 seconds on each side  If you need ideas or help with getting more active:  -Silver sneakers https://tools.silversneakers.com  -Walk with a Doc: http://www.duncan-williams.com/  -try to include resistance (weight lifting/strength building) and balance exercises twice per week: or the following link for ideas: http://castillo-powell.com/  BuyDucts.dk  STRESS MANAGEMENT: -can try meditating, or just sitting quietly with deep breathing while intentionally relaxing all parts of your body for 5 minutes daily -if you need further help with stress, anxiety or depression please follow up with your primary doctor or contact the wonderful folks at [facility]: [phone]  SOCIAL CONNECTIONS: -options in [place] if you wish to engage in more social and exercise related activities:  -Silver sneakers https://tools.silversneakers.com  -Walk with a Doc: http://www.duncan-williams.com/  -Check out the [facility] Active Adults 50+ section on the [place] [employer] (hiking clubs, book clubs, cards and games, chess, exercise classes, aquatic classes and much more) - see the website for details: https://www.[place].gov/departments/parks-recreation/active-adults50  -YouTube has lots of exercise videos for different ages and abilities as well  -[facility] (a variety of indoor and outdoor inperson activities for adults). 229-730-4434. [address]  -Virtual Online Classes (a variety of topics): see seniorplanet.org or call 605-740-5466  -consider volunteering at a school, hospice center, church, senior center or elsewhere

## 2023-01-09 DIAGNOSIS — K2100 Gastro-esophageal reflux disease with esophagitis, without bleeding: Secondary | ICD-10-CM | POA: Diagnosis not present

## 2023-01-09 DIAGNOSIS — K2930 Chronic superficial gastritis without bleeding: Secondary | ICD-10-CM | POA: Diagnosis not present

## 2023-01-25 ENCOUNTER — Ambulatory Visit (INDEPENDENT_AMBULATORY_CARE_PROVIDER_SITE_OTHER): Payer: Medicare Other | Admitting: Family Medicine

## 2023-01-25 ENCOUNTER — Ambulatory Visit: Payer: Medicare Other

## 2023-01-25 ENCOUNTER — Other Ambulatory Visit: Payer: Self-pay | Admitting: Internal Medicine

## 2023-01-25 ENCOUNTER — Encounter: Payer: Self-pay | Admitting: Family Medicine

## 2023-01-25 VITALS — BP 118/68 | HR 54 | Temp 98.1°F | Wt 186.0 lb

## 2023-01-25 DIAGNOSIS — E042 Nontoxic multinodular goiter: Secondary | ICD-10-CM

## 2023-01-25 DIAGNOSIS — I771 Stricture of artery: Secondary | ICD-10-CM | POA: Diagnosis not present

## 2023-01-25 DIAGNOSIS — R0602 Shortness of breath: Secondary | ICD-10-CM

## 2023-01-25 NOTE — Progress Notes (Signed)
   Subjective:    Patient ID: Luke [name], male    DOB: 04/25/1945, 77 y.o.   MRN: [id number]  HPI Here for trouble breathing that began over a year ago. He feels this almost every day. It has not changed over time. No coughing or wheezing. No chest pain or palpitations. He has a hx of PAF, but he says he can always feel this when it happens. He has not felt this for months. No ankle swelling. He feels this sensation during the day, but he mostly notices it at night when he is lying in bed. He says he feels the need to take deep breaths as if he is not pulling enough air into his lungs. He had a negative Myoview perfusion study of his heart in 2018. He had an ECHO in 2020 showing an EF of 60-65% with slight  diastolic dysfunction. He saw his cardiologist, Dr. Antoine Poche, on 11-27-22 and he had a normal exam. He saw his primary provider at the [facility] recently, and he has been following some thyroid nodules. A recent US revealed the left nodule to be benign, but the right nodule needs a biopsy, so he is arranging for this. His labs there on 01-08-23 were remarkable for a normal Hgb of 13.4, creatinine of 1.01, and TSH of 1.155.    Review of Systems  Constitutional: Negative.   Respiratory:  Positive for shortness of breath. Negative for cough, chest tightness and wheezing.   Cardiovascular: Negative.   Gastrointestinal: Negative.        Objective:   Physical Exam Constitutional:      Appearance: Normal appearance.  Cardiovascular:     Rate and Rhythm: Normal rate and regular rhythm.     Pulses: Normal pulses.     Heart sounds: Normal heart sounds.  Pulmonary:     Effort: Pulmonary effort is normal.     Breath sounds: Normal breath sounds.  Neurological:     General: No focal deficit present.     Mental Status: He is alert and oriented to person, place, and time.           Assessment & Plan:  He has experiencing some mild SOB which is most evident when lying in bed. I think  the most likely etiology of this is anxiety, but we will get a CXR today and will det up another ECHO soon. Gershon Crane, MD

## 2023-02-12 ENCOUNTER — Other Ambulatory Visit (HOSPITAL_BASED_OUTPATIENT_CLINIC_OR_DEPARTMENT_OTHER): Payer: Medicare Other

## 2023-02-14 NOTE — Progress Notes (Signed)
Luke Come, MD  Luke [name] OK for US guided FNA of R mid 2.4 cm TR3 nodule.  No sedation.  Nedra Hai       Previous Messages    ----- Message ----- From: Luke [name] Sent: 02/14/2023  10:55 AM EST To: Luke [name]; Ir Procedure Requests Subject: [place] FNA BX THYROID 1ST LESION AFIRMA            Procedure: [place] FNA BX THYROID 1ST LESION AFIRMA  Reason : NOIDULE #1-RIGHT MOSTLY SOLID 2.4CM NODULE. FNA RECOMMENDED. LOCATED LATERALLY W/IN THE UPPER TO MID ASPECT OF THYROID LOBE. Dx: Nontoxic multinodular goiter [E04.2 (ICD-10-CM)]  History : Thyroid US - in PACS   Provider: Marlene Lard, MD  Provider contact : [phone]

## 2023-03-02 ENCOUNTER — Other Ambulatory Visit: Payer: Self-pay | Admitting: Family Medicine

## 2023-03-14 ENCOUNTER — Ambulatory Visit (INDEPENDENT_AMBULATORY_CARE_PROVIDER_SITE_OTHER): Payer: Medicare Other

## 2023-03-14 DIAGNOSIS — R0602 Shortness of breath: Secondary | ICD-10-CM

## 2023-03-14 LAB — ECHOCARDIOGRAM COMPLETE
Area-P 1/2: 3.48 cm2
S' Lateral: 3.07 cm

## 2023-03-19 ENCOUNTER — Ambulatory Visit (INDEPENDENT_AMBULATORY_CARE_PROVIDER_SITE_OTHER): Payer: Medicare Other | Admitting: Family Medicine

## 2023-03-19 ENCOUNTER — Encounter: Payer: Self-pay | Admitting: Family Medicine

## 2023-03-19 ENCOUNTER — Other Ambulatory Visit: Payer: Self-pay

## 2023-03-19 VITALS — BP 124/70 | HR 59 | Temp 98.2°F | Ht 67.0 in

## 2023-03-19 DIAGNOSIS — E785 Hyperlipidemia, unspecified: Secondary | ICD-10-CM | POA: Diagnosis not present

## 2023-03-19 DIAGNOSIS — K219 Gastro-esophageal reflux disease without esophagitis: Secondary | ICD-10-CM | POA: Diagnosis not present

## 2023-03-19 DIAGNOSIS — I6523 Occlusion and stenosis of bilateral carotid arteries: Secondary | ICD-10-CM | POA: Diagnosis not present

## 2023-03-19 DIAGNOSIS — J4520 Mild intermittent asthma, uncomplicated: Secondary | ICD-10-CM | POA: Diagnosis not present

## 2023-03-19 DIAGNOSIS — G2581 Restless legs syndrome: Secondary | ICD-10-CM

## 2023-03-19 DIAGNOSIS — R911 Solitary pulmonary nodule: Secondary | ICD-10-CM | POA: Diagnosis not present

## 2023-03-19 DIAGNOSIS — I10 Essential (primary) hypertension: Secondary | ICD-10-CM | POA: Diagnosis not present

## 2023-03-19 DIAGNOSIS — N401 Enlarged prostate with lower urinary tract symptoms: Secondary | ICD-10-CM

## 2023-03-19 DIAGNOSIS — N138 Other obstructive and reflux uropathy: Secondary | ICD-10-CM | POA: Diagnosis not present

## 2023-03-19 DIAGNOSIS — I6340 Cerebral infarction due to embolism of unspecified cerebral artery: Secondary | ICD-10-CM

## 2023-03-19 DIAGNOSIS — I4729 Other ventricular tachycardia: Secondary | ICD-10-CM

## 2023-03-19 DIAGNOSIS — I480 Paroxysmal atrial fibrillation: Secondary | ICD-10-CM

## 2023-03-19 DIAGNOSIS — E118 Type 2 diabetes mellitus with unspecified complications: Secondary | ICD-10-CM | POA: Diagnosis not present

## 2023-03-19 LAB — BASIC METABOLIC PANEL
BUN: 20 mg/dL (ref 6–23)
CO2: 27 meq/L (ref 19–32)
Calcium: 9.3 mg/dL (ref 8.4–10.5)
Chloride: 105 meq/L (ref 96–112)
Creatinine, Ser: 1.06 mg/dL (ref 0.40–1.50)
GFR: 67.93 mL/min (ref >60.00)
Glucose, Bld: 110 mg/dL — ABNORMAL HIGH (ref 70–99)
Potassium: 4.3 meq/L (ref 3.5–5.1)
Sodium: 141 meq/L (ref 135–145)

## 2023-03-19 LAB — PSA: PSA: 3.07 ng/mL (ref 0.10–4.00)

## 2023-03-19 LAB — CBC WITH DIFFERENTIAL/PLATELET
Basophils Absolute: 0 10*3/uL (ref 0.0–0.1)
Basophils Relative: 0.4 % (ref 0.0–3.0)
Eosinophils Absolute: 0.6 10*3/uL (ref 0.0–0.7)
Eosinophils Relative: 5.4 % — ABNORMAL HIGH (ref 0.0–5.0)
HCT: 43.4 % (ref 39.0–52.0)
Hemoglobin: 14.2 g/dL (ref 13.0–17.0)
Lymphocytes Relative: 24.4 % (ref 12.0–46.0)
Lymphs Abs: 2.8 10*3/uL (ref 0.7–4.0)
MCHC: 32.8 g/dL (ref 30.0–36.0)
MCV: 85.6 fL (ref 78.0–100.0)
Monocytes Absolute: 0.6 10*3/uL (ref 0.1–1.0)
Monocytes Relative: 5.7 % (ref 3.0–12.0)
Neutro Abs: 7.3 10*3/uL (ref 1.4–7.7)
Neutrophils Relative %: 64.1 % (ref 43.0–77.0)
Platelets: 316 10*3/uL (ref 150.0–400.0)
RBC: 5.07 Mil/uL (ref 4.22–5.81)
RDW: 14.6 % (ref 11.5–15.5)
WBC: 11.3 10*3/uL — ABNORMAL HIGH (ref 4.0–10.5)

## 2023-03-19 LAB — HEPATIC FUNCTION PANEL
ALT: 16 U/L (ref 0–53)
AST: 13 U/L (ref 0–37)
Albumin: 4.1 g/dL (ref 3.5–5.2)
Alkaline Phosphatase: 84 U/L (ref 39–117)
Bilirubin, Direct: 0.1 mg/dL (ref 0.0–0.3)
Total Bilirubin: 0.5 mg/dL (ref 0.2–1.2)
Total Protein: 6.8 g/dL (ref 6.0–8.3)

## 2023-03-19 LAB — LIPID PANEL
Cholesterol: 144 mg/dL (ref 0–200)
HDL: 31.4 mg/dL — ABNORMAL LOW (ref >39.00)
LDL Cholesterol: 67 mg/dL (ref 0–99)
NonHDL: 112.54
Total CHOL/HDL Ratio: 5
Triglycerides: 228 mg/dL — ABNORMAL HIGH (ref 0.0–149.0)
VLDL: 45.6 mg/dL — ABNORMAL HIGH (ref 0.0–40.0)

## 2023-03-19 LAB — HEMOGLOBIN A1C: Hgb A1c MFr Bld: 6.6 % — ABNORMAL HIGH (ref 4.6–6.5)

## 2023-03-19 LAB — TSH: TSH: 1.88 u[IU]/mL (ref 0.35–5.50)

## 2023-03-19 MED ORDER — LOSARTAN POTASSIUM 50 MG PO TABS: 50.0000 mg | ORAL_TABLET | Freq: Every day | ORAL | 3 refills | Status: DC

## 2023-03-19 MED ORDER — AMLODIPINE BESYLATE 10 MG PO TABS: 10.0000 mg | ORAL_TABLET | Freq: Every day | ORAL | 3 refills | Status: DC

## 2023-03-19 MED ORDER — METOPROLOL TARTRATE 25 MG PO TABS: 12.5000 mg | ORAL_TABLET | Freq: Two times a day (BID) | ORAL | 3 refills | Status: DC

## 2023-03-19 NOTE — Progress Notes (Signed)
 Subjective:    Patient ID: Luke [name], male    DOB: 1945-07-02, 78 y.o.   MRN: [id number]  HPI Here to follow up on issues. He is doing well in general. When we last saw him he was complaining of a sensation of SOB when he was lying in bed at night (never during the day). We got a CXR which was clear, and he had an ECHO which showed an EF of 55-60%, Grade I diastolic dysfunction, and normal valve function. Since then he says this has improved, and he no longer thinks about it. He will be due to see Cardiology in the next month or two. His BP has been stable. He watches his diet, and he is limiting his intake of sugars and carbs. He gets up to urinate once or twice at night, and this has been stable. His restless legs have been well controlled.    Review of Systems  Constitutional: Negative.   HENT: Negative.    Eyes: Negative.   Respiratory: Negative.    Cardiovascular: Negative.   Gastrointestinal: Negative.   Genitourinary: Negative.   Musculoskeletal: Negative.   Skin: Negative.   Neurological: Negative.   Psychiatric/Behavioral: Negative.         Objective:   Physical Exam Constitutional:      General: He is not in acute distress.    Appearance: Normal appearance. He is well-developed. He is not diaphoretic.  HENT:     Head: Normocephalic and atraumatic.     Right Ear: External ear normal.     Left Ear: External ear normal.     Nose: Nose normal.     Mouth/Throat:     Pharynx: No oropharyngeal exudate.  Eyes:     General: No scleral icterus.       Right eye: No discharge.        Left eye: No discharge.     Conjunctiva/sclera: Conjunctivae normal.     Pupils: Pupils are equal, round, and reactive to light.  Neck:     Thyroid : No thyromegaly.     Vascular: No JVD.     Trachea: No tracheal deviation.  Cardiovascular:     Rate and Rhythm: Normal rate and regular rhythm.     Pulses: Normal pulses.     Heart sounds: Normal heart sounds. No murmur heard.    No  friction rub. No gallop.  Pulmonary:     Effort: Pulmonary effort is normal. No respiratory distress.     Breath sounds: Normal breath sounds. No wheezing or rales.  Chest:     Chest wall: No tenderness.  Abdominal:     General: Bowel sounds are normal. There is no distension.     Palpations: Abdomen is soft. There is no mass.     Tenderness: There is no abdominal tenderness. There is no guarding or rebound.  Genitourinary:    Penis: Normal. No tenderness.      Testes: Normal.  Musculoskeletal:        General: No tenderness. Normal range of motion.     Cervical back: Neck supple.  Lymphadenopathy:     Cervical: No cervical adenopathy.  Skin:    General: Skin is warm and dry.     Coloration: Skin is not pale.     Findings: No erythema or rash.  Neurological:     General: No focal deficit present.     Mental Status: He is alert and oriented to person, place, and time.  Cranial Nerves: No cranial nerve deficit.     Motor: No abnormal muscle tone.     Coordination: Coordination normal.     Deep Tendon Reflexes: Reflexes are normal and symmetric. Reflexes normal.  Psychiatric:        Mood and Affect: Mood normal.        Behavior: Behavior normal.        Thought Content: Thought content normal.        Judgment: Judgment normal.           Assessment & Plan:  His PAF and HTN are stable. He has been getting his Eliquis  through the [place] [facility]. His restless legs are stable. We will get fasting labs today to check lipids, an A1c, etc. He is scheduled for a thyroid  US  and biopsy this Friday. We spent a total of ( 34  ) minutes reviewing records and discussing these issues.   Garnette Olmsted, MD

## 2023-03-20 ENCOUNTER — Ambulatory Visit: Payer: Self-pay | Admitting: Family Medicine

## 2023-03-20 ENCOUNTER — Other Ambulatory Visit: Payer: Self-pay

## 2023-03-20 MED ORDER — METOPROLOL TARTRATE 25 MG PO TABS: 12.5000 mg | ORAL_TABLET | Freq: Two times a day (BID) | ORAL | 0 refills | Status: DC

## 2023-03-20 NOTE — Telephone Encounter (Signed)
 Copied from CRM [id number]. Topic: Clinical - Medication Question >> Mar 20, 2023 10:07 AM Zacyrah J wrote: Reason for CRM: pt was seen by Dr. Alyne Babinski yesterday. Dr. Alyne Babinski sent pt medication (metoprolol  tartrate (LOPRESSOR ) 25 MG) to [facility]. [facility] is stating that it's going to take 4-5 days to process. Pt would like for Dr. Alyne Babinski to send a 30 days supply of medication (metoprolol  tartrate (LOPRESSOR ) 25 MG) to [facility]

## 2023-03-22 ENCOUNTER — Ambulatory Visit (HOSPITAL_COMMUNITY)
Admission: RE | Admit: 2023-03-22 | Discharge: 2023-03-22 | Disposition: A | Discharge status: Home / Self Care | Payer: Medicare Other | Source: Ambulatory Visit | Attending: Internal Medicine | Admitting: Internal Medicine

## 2023-03-22 ENCOUNTER — Other Ambulatory Visit (HOSPITAL_COMMUNITY)
Admission: RE | Admit: 2023-03-22 | Discharge: 2023-03-22 | Disposition: A | Discharge status: Home / Self Care | Payer: Medicare Other | Source: Ambulatory Visit

## 2023-03-22 DIAGNOSIS — E042 Nontoxic multinodular goiter: Secondary | ICD-10-CM | POA: Insufficient documentation

## 2023-03-22 DIAGNOSIS — E041 Nontoxic single thyroid nodule: Secondary | ICD-10-CM | POA: Diagnosis not present

## 2023-03-22 MED ORDER — LIDOCAINE HCL (PF) 1 % IJ SOLN
2.0000 mL | Freq: Once | INTRAMUSCULAR | Status: AC
  Administered 2023-03-22: 2 mL via INTRADERMAL

## 2023-03-22 NOTE — Procedures (Signed)
PROCEDURE SUMMARY:  Using direct ultrasound guidance, 5 passes were made using 27 g needles into the nodule within the right mid lobe of the thyroid.   Ultrasound was used to confirm needle placements on all occasions.   EBL = trace  Specimens were sent to Pathology for analysis.  See procedure note under Imaging tab in Epic for full procedure details.  Aliyah Abeyta H Nishant Schrecengost PA-C 03/22/2023 1:22 PM

## 2023-03-25 ENCOUNTER — Encounter: Payer: Self-pay | Admitting: *Deleted

## 2023-03-26 LAB — CYTOLOGY - NON PAP

## 2023-04-03 ENCOUNTER — Ambulatory Visit (INDEPENDENT_AMBULATORY_CARE_PROVIDER_SITE_OTHER): Payer: Medicare Other | Admitting: Family Medicine

## 2023-04-03 ENCOUNTER — Encounter: Payer: Self-pay | Admitting: Family Medicine

## 2023-04-03 VITALS — BP 124/70 | HR 61 | Temp 97.9°F | Wt 183.0 lb

## 2023-04-03 DIAGNOSIS — H6123 Impacted cerumen, bilateral: Secondary | ICD-10-CM | POA: Diagnosis not present

## 2023-04-03 NOTE — Progress Notes (Signed)
   Subjective:    Patient ID: Luke [name], male    DOB: October 03, 1945, 78 y.o.   MRN: [id number]  HPI Here to check his left ear and to go over recent lab results. His results were significant for an A1c of 6.6% and an LDL of 67. Also for the past week he has heard crackling noises in the left ear. No pain. This has not affected his hearing.    Review of Systems  Constitutional: Negative.   HENT: Negative.    Respiratory: Negative.    Cardiovascular: Negative.        Objective:   Physical Exam Constitutional:      Appearance: Normal appearance.  HENT:     Right Ear: There is impacted cerumen.     Left Ear: There is impacted cerumen.  Cardiovascular:     Rate and Rhythm: Normal rate and regular rhythm.     Pulses: Normal pulses.     Heart sounds: Normal heart sounds.  Pulmonary:     Effort: Pulmonary effort is normal.     Breath sounds: Normal breath sounds.  Neurological:     Mental Status: He is alert.           Assessment & Plan:  Cerumen impactions. After informed consent was obtained, both ear canals were irrigated clear with water. He tolerated the procedure well.  Gershon Crane, MD

## 2023-05-27 DIAGNOSIS — D2271 Melanocytic nevi of right lower limb, including hip: Secondary | ICD-10-CM | POA: Diagnosis not present

## 2023-05-27 DIAGNOSIS — L853 Xerosis cutis: Secondary | ICD-10-CM | POA: Diagnosis not present

## 2023-05-27 DIAGNOSIS — D2262 Melanocytic nevi of left upper limb, including shoulder: Secondary | ICD-10-CM | POA: Diagnosis not present

## 2023-05-27 DIAGNOSIS — D225 Melanocytic nevi of trunk: Secondary | ICD-10-CM | POA: Diagnosis not present

## 2023-05-27 DIAGNOSIS — D2261 Melanocytic nevi of right upper limb, including shoulder: Secondary | ICD-10-CM | POA: Diagnosis not present

## 2023-05-27 DIAGNOSIS — L821 Other seborrheic keratosis: Secondary | ICD-10-CM | POA: Diagnosis not present

## 2023-05-27 DIAGNOSIS — D1801 Hemangioma of skin and subcutaneous tissue: Secondary | ICD-10-CM | POA: Diagnosis not present

## 2023-05-27 DIAGNOSIS — L814 Other melanin hyperpigmentation: Secondary | ICD-10-CM | POA: Diagnosis not present

## 2023-05-27 DIAGNOSIS — D2272 Melanocytic nevi of left lower limb, including hip: Secondary | ICD-10-CM | POA: Diagnosis not present

## 2023-05-28 ENCOUNTER — Other Ambulatory Visit: Payer: Self-pay

## 2023-05-28 ENCOUNTER — Encounter: Payer: Self-pay | Admitting: Family Medicine

## 2023-05-28 ENCOUNTER — Other Ambulatory Visit: Payer: Self-pay | Admitting: Family Medicine

## 2023-05-28 MED ORDER — METAXALONE 800 MG PO TABS: 800.0000 mg | ORAL_TABLET | Freq: Three times a day (TID) | ORAL | 0 refills | Status: AC

## 2023-05-28 NOTE — Telephone Encounter (Signed)
 Copied from CRM [id number]. Topic: Clinical - Medication Refill >> May 28, 2023  9:29 AM Benetta Spar A wrote: Most Recent Primary Care Visit:  Provider: Gershon Crane A  Department: [facility]  Visit Type: OFFICE VISIT  Date: 04/03/2023  Medication: Metaxalone 800 MG  Has the patient contacted their pharmacy? No (Agent: If no, request that the patient contact the pharmacy for the refill. If patient does not wish to contact the pharmacy document the reason why and proceed with request.) (Agent: If yes, when and what did the pharmacy advise?)  Is this the correct pharmacy for this prescription? Yes If no, delete pharmacy and type the correct one.  This is the patient's preferred pharmacy:  [facility] [place], [place] - [address] [facility] [place] [address] Phone: [phone] Fax: [phone]  [facility] - [place], [place] - [facility] Portal to [facility] [address] [place] [address] Phone: [phone] Fax: [phone]   Has the prescription been filled recently? No  Is the patient out of the medication? No  Has the patient been seen for an appointment in the last year OR does the patient have an upcoming appointment? Yes  Can we respond through MyChart? Yes  Agent: Please be advised that Rx refills may take up to 3 business days. We ask that you follow-up with your pharmacy.

## 2023-06-10 DIAGNOSIS — K222 Esophageal obstruction: Secondary | ICD-10-CM | POA: Diagnosis not present

## 2023-06-10 DIAGNOSIS — R1310 Dysphagia, unspecified: Secondary | ICD-10-CM | POA: Diagnosis not present

## 2023-06-10 DIAGNOSIS — K219 Gastro-esophageal reflux disease without esophagitis: Secondary | ICD-10-CM | POA: Diagnosis not present

## 2023-06-26 ENCOUNTER — Ambulatory Visit: Payer: Self-pay

## 2023-06-26 NOTE — Telephone Encounter (Signed)
 Chief Complaint: Bleeding in ear, decreased hearing Symptoms: see above Frequency: 3 days Pertinent Negatives: Patient denies fever, pain in ear Disposition: [] ED /[] Urgent Care (no appt availability in office) / [x] Appointment(In office/virtual)/ []  [facility]/ [] Home Care/ [] Refused Recommended Disposition /[]  Mobile Bus/ []  Follow-up with PCP Additional Notes: Patient called in asking for an appt for Monday (patient is out of town until Sunday evening) to examine his left ear. Patient has had intermittent bleeding and notices he has had decreased hearing. Patient denies dizziness, pain in ear, injury to ear. Patient appt made for Monday. Patient educated to visit nearest UC if he develops fever, pain, dizziness. Patient verbalized understanding.    Copied from CRM [id number]. Topic: Clinical - Red Word Triage >> Jun 26, 2023 11:55 AM Alethia Huxley E wrote: [facility] that prompted transfer to Nurse Triage: Patient having trouble hearing out of left ear, patient also noticed his ear has been bleeding internally. Symptoms have been going on 2-3 days. Reason for Disposition  [1] Decreased hearing in 1 ear AND [2] gradual onset  Answer Assessment - Initial Assessment Questions 1. DESCRIPTION: "What type of hearing problem are you having? Describe it for me." (e.g., complete hearing loss, partial loss)     Random bleeding in ear and diminished loss of hearing 2. LOCATION: "One or both ears?" If one, ask: "Which ear?"     Left ear 3. SEVERITY: "Can you hear anything?" If Yes, ask: "What can you hear?" (e.g., ticking watch, whisper, talking)   - MILD:  Difficulty hearing soft speech, quiet library sounds, or speech from a distance or over background noise.   - MODERATE: Difficulty hearing normal speech even at closed distances.   - SEVERE: Unable to hear most normal conversation and talking; only able to hear loud sounds such as an alarm clock.     Moderate 4. ONSET: "When did  this begin?" "Did it start suddenly or come on gradually?"     3 days ago 5. PATTERN: "Does this come and go, or has it been constant since it started?"     No 6. PAIN: "Is there any pain in your ear(s)?"  (Scale 1-10; or mild, moderate, severe)   - NONE (0): no pain   - MILD (1-3): doesn't interfere with normal activities    - MODERATE (4-7): interferes with normal activities or awakens from sleep    - SEVERE (8-10): excruciating pain, unable to do any normal activities      No 7. CAUSE: "What do you think is causing this hearing problem?"     Patient thinks blood has dried in ear 8. OTHER SYMPTOMS: "Do you have any other symptoms?" (e.g., dizziness, ringing in ears)     No newness  Protocols used: Hearing Loss or Change-A-AH

## 2023-06-27 NOTE — Telephone Encounter (Signed)
 Noted.

## 2023-06-30 ENCOUNTER — Emergency Department (HOSPITAL_BASED_OUTPATIENT_CLINIC_OR_DEPARTMENT_OTHER)

## 2023-06-30 ENCOUNTER — Emergency Department (HOSPITAL_BASED_OUTPATIENT_CLINIC_OR_DEPARTMENT_OTHER): Admission: EM | Admit: 2023-06-30 | Discharge: 2023-06-30 | Disposition: A | Discharge status: Home / Self Care

## 2023-06-30 ENCOUNTER — Encounter (HOSPITAL_BASED_OUTPATIENT_CLINIC_OR_DEPARTMENT_OTHER): Payer: Self-pay | Admitting: *Deleted

## 2023-06-30 ENCOUNTER — Other Ambulatory Visit: Payer: Self-pay

## 2023-06-30 DIAGNOSIS — R079 Chest pain, unspecified: Secondary | ICD-10-CM | POA: Diagnosis not present

## 2023-06-30 DIAGNOSIS — H9202 Otalgia, left ear: Secondary | ICD-10-CM | POA: Insufficient documentation

## 2023-06-30 DIAGNOSIS — I480 Paroxysmal atrial fibrillation: Secondary | ICD-10-CM | POA: Diagnosis not present

## 2023-06-30 DIAGNOSIS — Z7901 Long term (current) use of anticoagulants: Secondary | ICD-10-CM | POA: Diagnosis not present

## 2023-06-30 DIAGNOSIS — R002 Palpitations: Secondary | ICD-10-CM | POA: Diagnosis present

## 2023-06-30 DIAGNOSIS — I4891 Unspecified atrial fibrillation: Secondary | ICD-10-CM | POA: Diagnosis not present

## 2023-06-30 LAB — BASIC METABOLIC PANEL WITH GFR
Anion gap: 11 (ref 5–15)
BUN: 21 mg/dL (ref 8–23)
CO2: 25 mmol/L (ref 22–32)
Calcium: 9.3 mg/dL (ref 8.9–10.3)
Chloride: 107 mmol/L (ref 98–111)
Creatinine, Ser: 1.28 mg/dL — ABNORMAL HIGH (ref 0.61–1.24)
GFR, Estimated: 58 mL/min — ABNORMAL LOW (ref >60)
Glucose, Bld: 230 mg/dL — ABNORMAL HIGH (ref 70–99)
Potassium: 4.4 mmol/L (ref 3.5–5.1)
Sodium: 143 mmol/L (ref 135–145)

## 2023-06-30 LAB — CBC
HCT: 40 % (ref 39.0–52.0)
Hemoglobin: 13.3 g/dL (ref 13.0–17.0)
MCH: 28.2 pg (ref 26.0–34.0)
MCHC: 33.3 g/dL (ref 30.0–36.0)
MCV: 84.7 fL (ref 80.0–100.0)
Platelets: 296 10*3/uL (ref 150–400)
RBC: 4.72 MIL/uL (ref 4.22–5.81)
RDW: 13.8 % (ref 11.5–15.5)
WBC: 11.9 10*3/uL — ABNORMAL HIGH (ref 4.0–10.5)
nRBC: 0 % (ref 0.0–0.2)

## 2023-06-30 LAB — MAGNESIUM: Magnesium: 2 mg/dL (ref 1.7–2.4)

## 2023-06-30 NOTE — ED Provider Notes (Signed)
 [place] EMERGENCY DEPARTMENT AT [facility] Provider Note   CSN: [id number] Arrival date & time: 06/30/23  1947     History  Chief Complaint  Patient presents with   Atrial Fibrillation    Luke [name] is a 78 y.o. male.  78 year old male with atrial fibrillation presents to the emergency department for atrial fibrillation.  He is on Eliquis .  He reports feeling that he went into A-fib yesterday morning.  He typically goes out of A-fib within 24 hours.  Heart rate was initially in the 130s, but has been taking metoprolol  and heart rate has been down less than 100.  Notes of palpitations but denies lightheadedness, dizziness, near-syncope, syncope, chest pain, shortness of breath.  Also complaining of some pain to his left ear for the past several days    Atrial Fibrillation       Home Medications Prior to Admission medications   Medication Sig Start Date End Date Taking? Authorizing Provider  albuterol  (VENTOLIN  HFA) 108 (90 Base) MCG/ACT inhaler Inhale 2 puffs into the lungs every 6 hrs as needed for Wheezing or SOB 06/07/21   Donley Furth, MD  amLODipine  (NORVASC ) 10 MG tablet Take 1 tablet (10 mg total) by mouth daily. 03/19/23   Donley Furth, MD  apixaban  (ELIQUIS ) 5 MG TABS tablet Take 1 tablet (5 mg total) by mouth 2 (two) times daily. 03/17/21 10/03/22  Donley Furth, MD  atorvastatin  (LIPITOR ) 20 MG tablet Take 1 tablet (20 mg total) by mouth daily. Please keep scheduled appointment for future refills. Thank you. 11/27/22   Eilleen Grates, MD  losartan  (COZAAR ) 50 MG tablet Take 1 tablet (50 mg total) by mouth daily. 03/19/23   Donley Furth, MD  meclizine  (ANTIVERT ) 25 MG tablet Take 1 tablet (25 mg total) by mouth every 6 (six) hours as needed for dizziness. 08/25/21   Donley Furth, MD  metaxalone  (SKELAXIN ) 800 MG tablet Take 1 tablet (800 mg total) by mouth 3 (three) times daily. 05/28/23   Donley Furth, MD  metoprolol  tartrate (LOPRESSOR ) 25 MG  tablet Take 0.5 tablets (12.5 mg total) by mouth 2 (two) times daily. 05/29/23   Donley Furth, MD  valACYclovir  (VALTREX ) 1000 MG tablet Take 1 tablet (1,000 mg total) by mouth 2 (two) times daily as needed (breakouts). 03/19/22   Donley Furth, MD      Allergies    Aspirin , Hydrochlorothiazide , Irbesartan, and Symbicort  [budesonide -formoterol  fumarate]    Review of Systems   Review of Systems  Physical Exam Updated Vital Signs BP 103/70 (BP Location: Right Arm)   Pulse 100   Temp 98.1 F (36.7 C) (Oral)   Resp 20   Ht 5\' 7"  (1.702 m)   Wt 80.7 kg   SpO2 98%   BMI 27.88 kg/m  Physical Exam Vitals and nursing note reviewed.  Constitutional:      General: He is not in acute distress.    Appearance: He is not toxic-appearing.  HENT:     Head: Normocephalic.     Right Ear: Tympanic membrane and ear canal normal.     Left Ear: Tympanic membrane and ear canal normal.     Nose: Nose normal.     Mouth/Throat:     Mouth: Mucous membranes are moist.  Eyes:     Conjunctiva/sclera: Conjunctivae normal.  Cardiovascular:     Rate and Rhythm: Normal rate. Rhythm irregular.  Pulmonary:     Effort: Pulmonary effort is normal.  Breath sounds: Normal breath sounds.  Abdominal:     General: Abdomen is flat. There is no distension.     Palpations: Abdomen is soft.     Tenderness: There is no abdominal tenderness. There is no guarding or rebound.  Musculoskeletal:        General: Normal range of motion.  Skin:    General: Skin is warm.     Capillary Refill: Capillary refill takes less than 2 seconds.  Neurological:     Mental Status: He is alert and oriented to person, place, and time.  Psychiatric:        Mood and Affect: Mood normal.        Behavior: Behavior normal.     ED Results / Procedures / Treatments   Labs (all labs ordered are listed, but only abnormal results are displayed) Labs Reviewed  CBC - Abnormal; Notable for the following components:      Result Value    WBC 11.9 (*)    All other components within normal limits  BASIC METABOLIC PANEL WITH GFR - Abnormal; Notable for the following components:   Glucose, Bld 230 (*)    Creatinine, Ser 1.28 (*)    GFR, Estimated 58 (*)    All other components within normal limits  MAGNESIUM     EKG EKG Interpretation Date/Time:  Sunday June 30 2023 20:00:01 EDT Ventricular Rate:  97 PR Interval:    QRS Duration:  144 QT Interval:  392 QTC Calculation: 497 R Axis:   -27  Text Interpretation: Atrial fibrillation Right bundle branch block Abnormal ECG When compared with ECG of 27-Nov-2022 15:47, Atrial fibrillation has replaced Sinus rhythm Vent. rate has increased BY  37 BPM Confirmed by Elise Guile [phone]) on 06/30/2023 8:20:51 PM  Radiology DG Chest Portable 1 View Result Date: 06/30/2023 CLINICAL DATA:  Chest pain, atrial fibrillation EXAM: PORTABLE CHEST 1 VIEW COMPARISON:  01/25/2023 FINDINGS: Single frontal view of the chest demonstrates an unremarkable cardiac silhouette. No acute airspace disease, effusion, or pneumothorax. No acute bony abnormalities. IMPRESSION: 1. No acute intrathoracic process. Electronically Signed   By: Bobbye Burrow M.D.   On: 06/30/2023 21:10    Procedures Procedures    Medications Ordered in ED Medications - No data to display  ED Course/ Medical Decision Making/ A&P                                 Medical Decision Making This is a 78 year old male presenting emergency department for atrial fibrillation.  Patient is in atrial fibrillation, appears to be rate controlled with heart rate less than 100 on the monitor has not better interpreted by me.  He is largely asymptomatic other than some slight palpitations.  But he is not having chest pain and no shortness of breath near-syncope or syncope.  Chest x-ray normal.  Minor leukocytosis, but no obvious sign of infection.  No significant metabolic derangements.  Minor elevation in his creatinine, but not true AKI.   Magnesium  level normal.  Discussed findings with patient.  He is compliant with his Eliquis .  Offered cardioversion today versus continued rate control with the hope that he converts and follow-up with cardiology.  Patient elected to follow-up outpatient with cardiology.  He was primarily concerned that he would be doing damage to his heart by staying in atrial fibrillation.  His ear does not appear to be infected or with perforation.  Does have some wax  in ear.  Amount and/or Complexity of Data Reviewed Independent Historian:     Details: Family over notes patient taking metoprolol  every 4-6 hours External Data Reviewed:     Details: On Eliquis  Labs: ordered. Decision-making details documented in ED Course. Radiology: ordered and independent interpretation performed. ECG/medicine tests: independent interpretation performed.    Details: Rate controlled atrial fibrillation.  No ST segment changes to indicate ischemia.  Risk Decision regarding hospitalization.          Final Clinical Impression(s) / ED Diagnoses Final diagnoses:  Paroxysmal atrial fibrillation [facility])    Rx / DC Orders ED Discharge Orders     None         Rolinda Climes, DO 06/30/23 2202

## 2023-06-30 NOTE — ED Triage Notes (Addendum)
 Pt states his heart rate has been up since Saturday morning. States he felt a fluttering feeling and "jittery" denies any chest pain or sob. HR 112 Atrial fib on arrival. States he has not had atrial fib since Sept of 2024. Has taken extra lopressor  since his he went into atrial fib. Pt states his left ear has been bothering him as well. Denies any fevers. States he had a stressful situation on Friday night.

## 2023-06-30 NOTE — Discharge Instructions (Signed)
 Please continue metoprolol  and your Eliquis .  Please follow-up with your cardiologist.  Return if develop fevers, chills, chest pain, shortness of breath, lightheadedness, passout your heart rate stays above 115 bpm for more than an hour despite metoprolol  use or any new or worsening symptoms that are concerning to you.

## 2023-07-01 ENCOUNTER — Ambulatory Visit (INDEPENDENT_AMBULATORY_CARE_PROVIDER_SITE_OTHER): Admitting: Family Medicine

## 2023-07-01 ENCOUNTER — Encounter: Payer: Self-pay | Admitting: Family Medicine

## 2023-07-01 ENCOUNTER — Telehealth: Payer: Self-pay | Admitting: Cardiology

## 2023-07-01 VITALS — BP 124/80 | HR 68 | Temp 97.8°F | Wt 186.0 lb

## 2023-07-01 DIAGNOSIS — H6123 Impacted cerumen, bilateral: Secondary | ICD-10-CM | POA: Diagnosis not present

## 2023-07-01 DIAGNOSIS — I480 Paroxysmal atrial fibrillation: Secondary | ICD-10-CM | POA: Diagnosis not present

## 2023-07-01 NOTE — Telephone Encounter (Signed)
 Patient c/o Palpitations: STAT if patient c/o lightheadedness, shortness of breath, or chest pain  How long have you had palpitations/irregular HR/ Afib? Are you having the symptoms now? Started 3 day ago and was at the ER last night  Are you currently experiencing lightheadedness, SOB or CP? No  Do you have a history of afib (atrial fibrillation) or irregular heart rhythm? Yes, but typically only last for one day  Have you checked your BP or HR? (document readings if available): "Currently under 100"  Are you experiencing any other symptoms? Extreme Fatigue

## 2023-07-01 NOTE — Telephone Encounter (Signed)
 Spoke with Luke [name] over the phone and he stated that he was told to contact out-Luke [name] cards office and continue metoprolol  during his ED visit last night. Denies any missed doses of Eliquis . DCCV offered at ED but Luke [name] wanted to see if he could come out of afib on his own but has not been able to. HR has still been under 100, mostly in the 80s. Luke [name] states his normal HR is low 50s/60s. Denies any SOB or CP. Luke [name] denies being sick recently but does states he had a stressful situation on Friday night. Luke [name] states that in the past, alcohol use has been a trigger for his afib but denies any alcohol use since this episode started. Explained to Luke [name] that I will forward this to Dr. Lavonne Prairie and his covering nurse to review and someone will be back in contact with recommendations.

## 2023-07-01 NOTE — Progress Notes (Signed)
   Subjective:    Patient ID: Luke [name], male    DOB: [birth year], 78 y.o.   MRN: [id number]  HPI Here to follow up on an ED visit yesterday and to check his left ear. He was here 3 months ago for bilateral cerumen impactions, and we successfully irrigated both canals clear. However about a week ago he felt some pressure in the left era as well as decreased hearing. Then a few days ago he noticed some bleeding from the left ear canal. He does not use Q-Tips. Yesterday he went to the ED after he had been in atrial fibrillation for 2 days. He felt this begin with fluttering in the chest, but he denies any SOB or chest pain. Normally if he increases his Metoprolol , he can convert to sinus rhythm within 24 hours. This time his heart rate started in the 130's and he was able to get this down to 100 before he went to the ED. At the ED his exam was otherwise normal. Labs and a CXR were normal. It was decided that as long as he can keep the heart rate below 100, they could wait to see if he converts spontaneously. Today he is still in fibrillation, and is taking Metoprolol  tartrate 25 mg every 6 hours.    Review of Systems  Constitutional: Negative.   HENT:  Positive for hearing loss. Negative for ear pain.   Respiratory: Negative.    Cardiovascular:  Positive for palpitations. Negative for chest pain and leg swelling.  Neurological: Negative.        Objective:   Physical Exam Constitutional:      Appearance: Normal appearance. He is not ill-appearing.  HENT:     Right Ear: There is impacted cerumen.     Left Ear: There is impacted cerumen.     Ears:     Comments: The left canal also has some clotted blood in it     Nose: Nose normal.     Mouth/Throat:     Mouth: Mucous membranes are dry.     Pharynx: Oropharynx is clear.  Eyes:     Conjunctiva/sclera: Conjunctivae normal.  Cardiovascular:     Rate and Rhythm: Normal rate. Rhythm irregular.     Pulses: Normal pulses.     Heart  sounds: Normal heart sounds.  Pulmonary:     Effort: Pulmonary effort is normal.     Breath sounds: Normal breath sounds.  Neurological:     Mental Status: He is alert.           Assessment & Plan:  He is on Day 3 of atrial fibrillation, but he is keeping the rate well controlled with Metoprolol . I advised him to go ahead and make an appt with his cardiologist, Dr. Lavonne Prairie, for this. He also has bilateral cerumen impactions, and we will refer him to ENT for these. We spent a total of ( 34  ) minutes reviewing records and discussing these issues.  Corita Diego, MD

## 2023-07-02 ENCOUNTER — Telehealth: Payer: Self-pay | Admitting: Cardiology

## 2023-07-02 NOTE — Telephone Encounter (Signed)
 Spoke with patient and he is aware we are trying to get him in AFIB clinic. Will give him a with appointment

## 2023-07-02 NOTE — Telephone Encounter (Signed)
Duplicate encounter - please see previous encounter.

## 2023-07-02 NOTE — Telephone Encounter (Signed)
 Follow Up:     Patient said he was calling back from yesterday He wanted to know what was decided?

## 2023-07-05 ENCOUNTER — Encounter (INDEPENDENT_AMBULATORY_CARE_PROVIDER_SITE_OTHER): Payer: Self-pay

## 2023-07-05 ENCOUNTER — Ambulatory Visit (INDEPENDENT_AMBULATORY_CARE_PROVIDER_SITE_OTHER): Admitting: Physician Assistant

## 2023-07-05 VITALS — BP 136/75 | HR 71 | Ht 67.0 in | Wt 178.0 lb

## 2023-07-05 DIAGNOSIS — H6123 Impacted cerumen, bilateral: Secondary | ICD-10-CM

## 2023-07-05 NOTE — Progress Notes (Signed)
 Dear Dr. Alyne Babinski, Here is my assessment for our mutual patient, Luke [name]. Thank you for allowing me the opportunity to care for your patient. Please do not hesitate to contact me should you have any other questions. Sincerely, Belma Boxer PA-C  Otolaryngology Clinic Note Referring provider: Dr. Alyne Babinski HPI:  Luke [name] is a 78 y.o. male kindly referred by Dr. Alyne Babinski   The patient is a 78 year old gentleman seen in our office for evaluation of cerumen impaction.  He notes 2 weeks ago he had some itching in his left ear, he notes he scratched it causing some bleeding.  He notes since that time he had had some difficulty with hearing out of the left ear.  Prior to this he notes some very minimal baseline hearing loss.  He notes that he did have a hearing evaluation several years ago at the [place] that showed some minimal hearing loss nothing significant.  He notes that given the decreased hearing out of his ear he went and saw his primary care provider who diagnosed him with bilateral cerumen impaction.  He notes he has had cerumen impactions previously and has had to have his ears cleaned out several times over the last several years.  He denies any significant pain, drainage, fever.  No associated dizziness.  He has had bilateral tonal tinnitus over the last 5 years which has not worsened.  He denies any history of recurrent ear infections, no head or neck surgery, no head or neck trauma.    Independent Review of Additional Tests or Records:  04/03/2023 PCP office visit note   07/01/2023 PCP note    PMH/Meds/All/SocHx/FamHx/ROS:   Past Medical History:  Diagnosis Date   Asthma    Depression    ED (erectile dysfunction)    Hyperlipidemia    Hypertension    Paroxysmal atrial fibrillation (HCC) 09/2016   Initial diagnosis was in setting of acute URI/asthma attack   Solitary pulmonary nodule 04/16/2013   03/30/13  CXR Question left nipple shadow.  10 mm ovoid nodular density right upper lobe,  cannot exclude  pulmonary mass/ nodule > f/u cxr 04/29/2013 > no nodule     Stroke [facility])    Ulcer      Past Surgical History:  Procedure Laterality Date   COLONOSCOPY  05/15/2019   per Dr. Baldo Bonds, clear, no repeats needed    NM MYOVIEW  LTD  10/2016   ailable, images/films reviewed: From Epic Chart or Care Everywhere)   TRANSTHORACIC ECHOCARDIOGRAM  09/2016   Normal systolic function. EF 5560%. No RWMA. Gr 1 DD. Mild AoV calcification - no stenosis. Atrial Septum - lipomatous hypertrophy. --Essentially normal.    Family History  Problem Relation Age of Onset   Stroke Mother    Hypertension Mother    Peripheral vascular disease Mother    Hyperlipidemia Father    Coronary artery disease Father    Peripheral vascular disease Father    Hypertension Brother    Coronary artery disease Brother    Cancer Brother        throat, smoked   Emphysema Brother        smoked   Lung cancer Brother        smoked     Social Connections: Moderately Integrated (01/03/2023)   Social Connection and Isolation Panel [NHANES]    Frequency of Communication with Friends and Family: More than three times a week    Frequency of Social Gatherings with Friends and Family: Twice a week  Attends Religious Services: More than 4 times per year    Active Member of Clubs or Organizations: No    Attends Banker Meetings: Never    Marital Status: Married      Current Outpatient Medications:    albuterol  (VENTOLIN  HFA) 108 (90 Base) MCG/ACT inhaler, Inhale 2 puffs into the lungs every 6 hrs as needed for Wheezing or SOB, Disp: 18 g, Rfl: 3   amLODipine  (NORVASC ) 10 MG tablet, Take 1 tablet (10 mg total) by mouth daily., Disp: 90 tablet, Rfl: 3   atorvastatin  (LIPITOR ) 20 MG tablet, Take 1 tablet (20 mg total) by mouth daily. Please keep scheduled appointment for future refills. Thank you., Disp: 90 tablet, Rfl: 3   losartan  (COZAAR ) 50 MG tablet, Take 1 tablet (50 mg total) by mouth  daily., Disp: 90 tablet, Rfl: 3   meclizine  (ANTIVERT ) 25 MG tablet, Take 1 tablet (25 mg total) by mouth every 6 (six) hours as needed for dizziness., Disp: 60 tablet, Rfl: 2   metaxalone  (SKELAXIN ) 800 MG tablet, Take 1 tablet (800 mg total) by mouth 3 (three) times daily., Disp: 90 tablet, Rfl: 0   metoprolol  tartrate (LOPRESSOR ) 25 MG tablet, Take 0.5 tablets (12.5 mg total) by mouth 2 (two) times daily., Disp: 30 tablet, Rfl: 5   valACYclovir  (VALTREX ) 1000 MG tablet, Take 1 tablet (1,000 mg total) by mouth 2 (two) times daily as needed (breakouts)., Disp: 60 tablet, Rfl: 5   apixaban  (ELIQUIS ) 5 MG TABS tablet, Take 1 tablet (5 mg total) by mouth 2 (two) times daily., Disp: 60 tablet, Rfl: 0   Physical Exam:   BP 136/75 (BP Location: Left Arm, Patient Position: Sitting, Cuff Size: Normal)   Pulse 71   Ht 5\' 7"  (1.702 m)   Wt 178 lb (80.7 kg)   SpO2 92%   BMI 27.88 kg/m   Pertinent Findings  CN II-XII intact Bilateral cerumen impaction Weber 512: equal Rinne 512: AC > BC b/l  Anterior rhinoscopy: Septum midline; bilateral inferior turbinates with no hypertrophy No lesions of oral cavity/oropharynx; dentition wnl No obviously palpable neck masses/lymphadenopathy/thyromegaly No respiratory distress or stridor  Seprately Identifiable Procedures:  Procedure: Bilateral ear microscopy and cerumen removal using microscope (CPT [phone]) - Mod 50 Pre-procedure diagnosis: bilateral cerumen impaction external auditory canals Post-procedure diagnosis: same Indication: bilateral cerumen impaction; given patient's otologic complaints and history as well as for improved and comprehensive examination of external ear and tympanic membrane, bilateral otologic examination using microscope was performed and impacted cerumen removed  Procedure: Patient was placed semi-recumbent. Both ear canals were examined using the microscope with findings above. Cerumen removed from bilateral external auditory  canals using suction and currette with improvement in EAC examination and patency. Left: EAC was patent. TM was intact . Middle ear was aerated. Drainage: none Right: EAC was patent. TM was intact . Middle ear was aerated . Drainage: none Patient tolerated the procedure well.   Impression & Plans:  Luke [name] is a 78 y.o. male with the following   Cerumen impaction-  The patient presented today with cerumen impaction.  This was removed without difficulty.  I see no signs of infection.  The patient will reach out to the office if she develops any new or worsening signs or symptoms.  She does not feel she has any significant change to her baseline hearing and did not elect for audiology evaluation today.    - f/u PRN   Thank you for allowing me the opportunity  to care for your patient. Please do not hesitate to contact me should you have any other questions.  Sincerely, Belma Boxer PA-C [facility] Phone: [phone] Fax: [phone]  07/05/2023, 10:12 AM

## 2023-07-05 NOTE — Patient Instructions (Signed)
 Please place 5 to 10 drops of carbamide peroxide (Debrox) into the affected ear. Keep the drops in your ear for several minutes by keeping your head tilted or placing a cotton ball in your ear. If needed, continue to use carbamide peroxide (Debrox) twice daily for up to 4 days.

## 2023-07-10 ENCOUNTER — Ambulatory Visit (HOSPITAL_COMMUNITY)
Admission: RE | Admit: 2023-07-10 | Discharge: 2023-07-10 | Disposition: A | Discharge status: Home / Self Care | Source: Ambulatory Visit | Attending: Physician Assistant | Admitting: Physician Assistant

## 2023-07-10 ENCOUNTER — Encounter (HOSPITAL_COMMUNITY): Payer: Self-pay | Admitting: Physician Assistant

## 2023-07-10 VITALS — BP 132/88 | HR 56 | Ht 67.0 in | Wt 184.2 lb

## 2023-07-10 DIAGNOSIS — D6869 Other thrombophilia: Secondary | ICD-10-CM | POA: Diagnosis not present

## 2023-07-10 DIAGNOSIS — I480 Paroxysmal atrial fibrillation: Secondary | ICD-10-CM | POA: Diagnosis not present

## 2023-07-10 NOTE — Progress Notes (Signed)
 Primary Care Physician: Donley Furth, MD Primary Cardiologist: Eilleen Grates, MD Electrophysiologist: None  Referring Physician: ED   Luke [name] is a 78 y.o. male with a history of HTN, carotid artery disease, HLD, CVA, atrial fibrillation who presents for follow up in the [facility].  He was found to have afib in the context of a URI.  He had a negative ischemia work up and unremarkable echo. At that time, he did not want to continue with the DOAC. He was later hospitalized with a CVA. Patient is on Eliquis  for stroke prevention.   He was seen at the ED 06/30/23 for atrial fibrillation. He was offered DCCV at that time but elected for outpatient follow up as he was fairly well rate controlled.   Patient presents today for follow up for atrial fibrillation. He is in SR today. His afib episode lasted roughly 3 days. He does admit he was in a stressful situation at the onset of his symptoms. No bleeding issues on anticoagulation.   Today, he denies symptoms of palpitations, chest pain, shortness of breath, orthopnea, PND, lower extremity edema, dizziness, presyncope, syncope, snoring, daytime somnolence, bleeding, or neurologic sequela. The patient is tolerating medications without difficulties and is otherwise without complaint today.    Atrial Fibrillation Risk Factors:  he does not have symptoms or diagnosis of sleep apnea. he does not have a history of rheumatic fever. he does not have a history of alcohol use. The patient does not have a history of early familial atrial fibrillation or other arrhythmias.  Atrial Fibrillation Management history:  Previous antiarrhythmic drugs: none Previous cardioversions: none Previous ablations: none Anticoagulation history: Eliquis   ROS- All systems are reviewed and negative except as per the HPI above.  Past Medical History:  Diagnosis Date   Asthma    Depression    ED (erectile dysfunction)     Hyperlipidemia    Hypertension    Paroxysmal atrial fibrillation (HCC) 09/2016   Initial diagnosis was in setting of acute URI/asthma attack   Solitary pulmonary nodule 04/16/2013   03/30/13  CXR Question left nipple shadow.  10 mm ovoid nodular density right upper lobe, cannot exclude  pulmonary mass/ nodule > f/u cxr 04/29/2013 > no nodule     Stroke [facility])    Ulcer     Current Outpatient Medications  Medication Sig Dispense Refill   albuterol  (VENTOLIN  HFA) 108 (90 Base) MCG/ACT inhaler Inhale 2 puffs into the lungs every 6 hrs as needed for Wheezing or SOB 18 g 3   amLODipine  (NORVASC ) 10 MG tablet Take 1 tablet (10 mg total) by mouth daily. 90 tablet 3   apixaban  (ELIQUIS ) 5 MG TABS tablet Take 1 tablet (5 mg total) by mouth 2 (two) times daily. 60 tablet 0   atorvastatin  (LIPITOR ) 20 MG tablet Take 1 tablet (20 mg total) by mouth daily. Please keep scheduled appointment for future refills. Thank you. 90 tablet 3   losartan  (COZAAR ) 50 MG tablet Take 1 tablet (50 mg total) by mouth daily. 90 tablet 3   meclizine  (ANTIVERT ) 25 MG tablet Take 1 tablet (25 mg total) by mouth every 6 (six) hours as needed for dizziness. 60 tablet 2   metaxalone  (SKELAXIN ) 800 MG tablet Take 1 tablet (800 mg total) by mouth 3 (three) times daily. 90 tablet 0   metoprolol  tartrate (LOPRESSOR ) 25 MG tablet Take 0.5 tablets (12.5 mg total) by mouth 2 (two) times daily. 30 tablet 5  valACYclovir  (VALTREX ) 1000 MG tablet Take 1 tablet (1,000 mg total) by mouth 2 (two) times daily as needed (breakouts). 60 tablet 5   No current facility-administered medications for this encounter.    Physical Exam: BP 132/88   Pulse (!) 56   Ht 5\' 7"  (1.702 m)   Wt 83.6 kg   BMI 28.85 kg/m   GEN: Well nourished, well developed in no acute distress CARDIAC: Regular rate and rhythm, no murmurs, rubs, gallops RESPIRATORY:  Clear to auscultation without rales, wheezing or rhonchi  ABDOMEN: Soft, non-tender,  non-distended EXTREMITIES:  No edema; No deformity   Wt Readings from Last 3 Encounters:  07/10/23 83.6 kg  07/05/23 80.7 kg  07/01/23 84.4 kg     EKG today demonstrates  SB, RBBB Vent. rate 56 BPM PR interval 156 ms QRS duration 140 ms QT/QTcB 458/441 ms   Echo 03/14/23 demonstrated   1. Left ventricular ejection fraction, by estimation, is 55 to 60%. The  left ventricle has normal function. The left ventricle has no regional  wall motion abnormalities. There is mild left ventricular hypertrophy.  Left ventricular diastolic parameters are consistent with Grade I diastolic dysfunction (impaired relaxation).   2. Right ventricular systolic function is normal. The right ventricular  size is normal. Tricuspid regurgitation signal is inadequate for assessing  PA pressure.   3. The mitral valve was not well visualized. No evidence of mitral valve  regurgitation.   4. The aortic valve was not well visualized. Aortic valve regurgitation  is not visualized.   5. There is moderate dilatation of the ascending aorta, measuring 39 mm.   6. The inferior vena cava is normal in size with greater than 50%  respiratory variability, suggesting right atrial pressure of 3 mmHg.     CHA2DS2-VASc Score = 6  The patient's score is based upon: CHF History: 0 HTN History: 1 Diabetes History: 0 Stroke History: 2 Vascular Disease History: 1 Age Score: 2 Gender Score: 0       ASSESSMENT AND PLAN: Paroxysmal Atrial Fibrillation (ICD10:  I48.0) The patient's CHA2DS2-VASc score is 6, indicating a 9.7% annual risk of stroke.   Patient converted to SR.  We discussed rhythm control options including AAD and ablation. Given his relatively infrequent episodes, will continue present therapy for now. If his episodes become more frequent, he would favor ablation.  Continue Lopressor  12.5 mg BID. May take 25 mg q 6 hours when in afib.  Continue Eliquis  5 mg BID Apple Watch for home  monitoring  Secondary Hypercoagulable State (ICD10:  D68.69) The patient is at significant risk for stroke/thromboembolism based upon his CHA2DS2-VASc Score of 6.  Continue Apixaban  (Eliquis ).   HTN Stable on current regimen    Follow up in the AF clinic in 6 months.        Myrtha Ates PA-C Af[facility] [address] [place], [place] [address] [phone]

## 2023-08-21 DIAGNOSIS — R3915 Urgency of urination: Secondary | ICD-10-CM | POA: Diagnosis not present

## 2023-08-21 DIAGNOSIS — N401 Enlarged prostate with lower urinary tract symptoms: Secondary | ICD-10-CM | POA: Diagnosis not present

## 2023-08-21 DIAGNOSIS — N5201 Erectile dysfunction due to arterial insufficiency: Secondary | ICD-10-CM | POA: Diagnosis not present

## 2023-08-26 DIAGNOSIS — H2513 Age-related nuclear cataract, bilateral: Secondary | ICD-10-CM | POA: Diagnosis not present

## 2023-11-27 NOTE — Progress Notes (Unsigned)
 Cardiology Office Note:   Date:  11/28/2023  ID:  Luke [name], Luke [name] [birth year], MRN [id number] PCP: Johnny Garnette LABOR, MD  [facility] Providers Cardiologist:  Lynwood Schilling, MD {  History of Present Illness:   Luke [name] is a 78 y.o. male with a history of HTN, carotid artery disease, HLD, CVA, atrial fibrillation who presents for follow up in the [facility].  He was found to have afib in the context of a URI.  He had a negative ischemia work up and unremarkable echo. At that time, he did not want to continue with the DOAC. He was later hospitalized with a CVA. Patient is on Eliquis  for stroke prevention.    He was seen at the ED 06/30/23 for atrial fibrillation. He was offered DCCV at that time but elected for outpatient follow up as he was fairly well rate controlled.   He was in SR when I saw him in the office.    Since I last saw him he has had no new cardiovascular complaints.  He is not had any fibrillation.  He denies any chest pressure, neck or arm discomfort.  He did see his [place] doctor who is following his carotids.  His right was up to 70%.  It was suggested that he increase his Lipitor  to 40 mg daily.  He wanted to talk about this.  He has had a little lower extremity swelling at times.  However, he has had no new shortness of breath, PND or orthopnea.  He is trying to stay active.  He likes to [place].    ROS: As stated in the HPI and negative for all other systems.  Studies Reviewed:    EKG:   EKG Interpretation Date/Time:  Thursday November 28 2023 14:17:05 EDT Ventricular Rate:  61 PR Interval:  160 QRS Duration:  144 QT Interval:  444 QTC Calculation: 446 R Axis:   -17  Text Interpretation: Normal sinus rhythm Right bundle branch block When compared with ECG of 10-Jul-2023 13:58, No significant change since last tracing Confirmed by Schilling Lynwood ([id number]) on 11/28/2023 2:21:39 PM    Risk Assessment/Calculations:     CHA2DS2-VASc Score = 6   This indicates a 9.7% annual risk of stroke. The patient's score is based upon: CHF History: 0 HTN History: 1 Diabetes History: 0 Stroke History: 2 Vascular Disease History: 1 Age Score: 2 Gender Score: 0  Physical Exam:   VS:  BP 130/68   Pulse 61   Ht 5' 7 (1.702 m)   Wt 185 lb (83.9 kg)   SpO2 97%   BMI 28.98 kg/m    Wt Readings from Last 3 Encounters:  11/28/23 185 lb (83.9 kg)  07/10/23 184 lb 3.2 oz (83.6 kg)  07/05/23 178 lb (80.7 kg)     GEN: Well nourished, well developed in no acute distress NECK: No JVD; No carotid bruits CARDIAC: RRR, no murmurs, rubs, gallops RESPIRATORY:  Clear to auscultation without rales, wheezing or rhonchi  ABDOMEN: Soft, non-tender, non-distended EXTREMITIES:  Mild edema; No deformity   ASSESSMENT AND PLAN:   Paroxysmal Atrial Fibrillation: He tolerates anticoagulation.  I did show him the Watchman video today although he does not have a contraindication to the anticoagulation and he will continue this.  He does not want to consider ablation as he has had infrequent paroxysms.  Otherwise no change in therapy.  Secondary Hypercoagulable State: He tolerates anticoagulation as above and he is on the appropriate  dose.  HTN: The blood pressure is well-controlled.  No change in therapy.  Carotid stenosis: He is having this followed at the [place] as above.  Dyslipidemia: I will increase his Lipitor  to 40 mg daily and he can get a lipid profile in 4 months with a goal LDL in the 50s.     Follow up with me in 1 year or sooner if needed.  Signed, Lynwood Schilling, MD

## 2023-11-28 ENCOUNTER — Encounter: Payer: Self-pay | Admitting: Cardiology

## 2023-11-28 ENCOUNTER — Ambulatory Visit: Attending: Cardiology | Admitting: Cardiology

## 2023-11-28 VITALS — BP 130/68 | HR 61 | Ht 67.0 in | Wt 185.0 lb

## 2023-11-28 DIAGNOSIS — E785 Hyperlipidemia, unspecified: Secondary | ICD-10-CM | POA: Diagnosis not present

## 2023-11-28 DIAGNOSIS — I480 Paroxysmal atrial fibrillation: Secondary | ICD-10-CM | POA: Diagnosis not present

## 2023-11-28 DIAGNOSIS — I10 Essential (primary) hypertension: Secondary | ICD-10-CM | POA: Insufficient documentation

## 2023-11-28 MED ORDER — ATORVASTATIN CALCIUM 40 MG PO TABS: 40.0000 mg | ORAL_TABLET | Freq: Every day | ORAL | 3 refills | Status: AC

## 2023-11-28 NOTE — Patient Instructions (Signed)
 Medication Instructions:  Increase Atorvastatin  to 40 mg daily Continue all other medications  *If you need a refill on your cardiac medications before your next appointment, please call your pharmacy*  Lab Work: Lipid panel in 3 months  Testing/Procedures: None ordered  Follow-Up: At [facility], you and your health needs are our priority.  As part of our continuing mission to provide you with exceptional heart care, our providers are all part of one team.  This team includes your primary Cardiologist (physician) and Advanced Practice Providers or APPs (Physician Assistants and Nurse Practitioners) who all work together to provide you with the care you need, when you need it.  Your next appointment:  1 year    Call in May to schedule Sept appointment     Provider:  Dr.Hochrein   We recommend signing up for the patient portal called MyChart.  Sign up information is provided on this After Visit Summary.  MyChart is used to connect with patients for Virtual Visits (Telemedicine).  Patients are able to view lab/test results, encounter notes, upcoming appointments, etc.  Non-urgent messages can be sent to your provider as well.   To learn more about what you can do with MyChart, go to [email or web address].

## 2023-11-29 ENCOUNTER — Encounter: Payer: Self-pay | Admitting: Radiology

## 2023-11-29 ENCOUNTER — Other Ambulatory Visit (HOSPITAL_COMMUNITY): Payer: Self-pay | Admitting: Internal Medicine

## 2023-11-29 DIAGNOSIS — E042 Nontoxic multinodular goiter: Secondary | ICD-10-CM

## 2023-11-29 NOTE — Progress Notes (Signed)
 Karalee Wilkie POUR, MD  Daralene Ferol FALCON, RT Approved, but please schedule at [facility], not in hospital.  [facility]       Previous Messages    ----- Message ----- From: Daralene Ferol FALCON, RT Sent: 11/29/2023   2:54 PM EDT To: Channing LITTIE Eagles; Georgios Kina F Tahjay Binion, RT; Ir Proc* Subject: US  FNA BX THYROID  1ST LESION AFIRMA            Procedure : US  FNA BX THYROID  1ST LESION AFIRMA  Reason :  Lt sided nodule 2.8 cm TR3 nodule needs FNA with thyroseq or affirma Dx: Nontoxic multinodular goiter [E04.2 (ICD-10-CM)]  History : US  FNA BX THYROID  1ST LESION AFIRMA (Accession [id number]) (Order [id number]), US  ULTRASOUND THYROID  Accession Number [id number] (IN PACS), US  ULTRASOUND THYROID  Accession [id number] (IN PACS)  Provider: Josephina Aures, MD  Provider contact ; Phone Icon [phone]

## 2023-12-09 ENCOUNTER — Encounter: Payer: Self-pay | Admitting: Family Medicine

## 2023-12-09 ENCOUNTER — Ambulatory Visit (INDEPENDENT_AMBULATORY_CARE_PROVIDER_SITE_OTHER): Admitting: Family Medicine

## 2023-12-09 VITALS — BP 138/78 | HR 62 | Temp 97.9°F | Resp 16 | Ht 67.0 in | Wt 186.6 lb

## 2023-12-09 DIAGNOSIS — J069 Acute upper respiratory infection, unspecified: Secondary | ICD-10-CM

## 2023-12-09 DIAGNOSIS — R051 Acute cough: Secondary | ICD-10-CM

## 2023-12-09 LAB — POC COVID19 BINAXNOW: SARS Coronavirus 2 Ag: NEGATIVE

## 2023-12-09 LAB — POCT INFLUENZA A/B
Influenza A, POC: NEGATIVE
Influenza B, POC: NEGATIVE

## 2023-12-09 MED ORDER — BENZONATATE 100 MG PO CAPS: 100.0000 mg | ORAL_CAPSULE | Freq: Two times a day (BID) | ORAL | 0 refills | Status: AC | PRN

## 2023-12-09 MED ORDER — FLUTICASONE PROPIONATE 50 MCG/ACT NA SUSP: 1.0000 | Freq: Every evening | NASAL | 0 refills | Status: AC | PRN

## 2023-12-09 NOTE — Progress Notes (Signed)
 ACUTE VISIT Chief Complaint  Patient presents with   Cough   Discussed the use of AI scribe software for clinical note transcription with the patient, who gave verbal consent to proceed.  History of Present Illness Luke [name] is a 78 year old male with past medical history of CVA, hypertension, paroxysmal atrial fibrillation, GERD, DM2, hyperlipidemia, and OA, who is here today complaining of cough and congestion.  He has experienced a dry, tickling, non productive cough, post nasal drip, and nasal, congestion. No significant sore throat. Symptoms worse in the morning. The cough does not disturb his sleep.  He had a fever of 100.43F yesterday accompanied by chills, but he denies fever today.  He has been hoarse for five to six days , denies stridor.  No difficulty breathing, wheezing, chest pain, headache, or earache.  He has not taken any specific medications for these symptoms except for Tylenol . He is feeling better today.  His wife was ill last week with a stomach virus, but her symptoms were different, involving nausea and gastrointestinal issues.  Review of Systems  Constitutional:  Positive for activity change, appetite change and fatigue.  HENT:  Positive for rhinorrhea. Negative for facial swelling, mouth sores, sinus pain and trouble swallowing.   Eyes:  Negative for discharge and redness.  Cardiovascular:  Negative for chest pain and leg swelling.  Gastrointestinal:  Negative for abdominal pain, nausea and vomiting.  Genitourinary:  Negative for decreased urine volume, dysuria and hematuria.  Musculoskeletal:  Negative for myalgias.  Skin:  Negative for rash.  Neurological:  Negative for syncope and weakness.  Psychiatric/Behavioral:  Negative for confusion and hallucinations.   See other pertinent positives and negatives in HPI.  Current Outpatient Medications on File Prior to Visit  Medication Sig Dispense Refill   albuterol  (VENTOLIN  HFA) 108 (90  Base) MCG/ACT inhaler Inhale 2 puffs into the lungs every 6 hrs as needed for Wheezing or SOB 18 g 3   amLODipine  (NORVASC ) 10 MG tablet Take 1 tablet (10 mg total) by mouth daily. 90 tablet 3   apixaban  (ELIQUIS ) 5 MG TABS tablet Take 1 tablet (5 mg total) by mouth 2 (two) times daily. 60 tablet 0   atorvastatin  (LIPITOR ) 40 MG tablet Take 1 tablet (40 mg total) by mouth daily. 90 tablet 3   losartan  (COZAAR ) 50 MG tablet Take 1 tablet (50 mg total) by mouth daily. 90 tablet 3   meclizine  (ANTIVERT ) 25 MG tablet Take 1 tablet (25 mg total) by mouth every 6 (six) hours as needed for dizziness. 60 tablet 2   metaxalone  (SKELAXIN ) 800 MG tablet Take 1 tablet (800 mg total) by mouth 3 (three) times daily. 90 tablet 0   metoprolol  tartrate (LOPRESSOR ) 25 MG tablet Take 0.5 tablets (12.5 mg total) by mouth 2 (two) times daily. 30 tablet 5   valACYclovir  (VALTREX ) 1000 MG tablet Take 1 tablet (1,000 mg total) by mouth 2 (two) times daily as needed (breakouts). 60 tablet 5   No current facility-administered medications on file prior to visit.    Past Medical History:  Diagnosis Date   Asthma    Depression    ED (erectile dysfunction)    Hyperlipidemia    Hypertension    Paroxysmal atrial fibrillation (HCC) 09/2016   Initial diagnosis was in setting of acute URI/asthma attack   Solitary pulmonary nodule 04/16/2013   03/30/13  CXR Question left nipple shadow.  10 mm ovoid nodular density right upper lobe, cannot exclude  pulmonary  mass/ nodule > f/u cxr 04/29/2013 > no nodule     Stroke (HCC)    Ulcer    Allergies  Allergen Reactions   Aspirin  Other (See Comments)    REACTION: ULCER @LARGE  DOSES  Other Reaction(s): gi upset   Hydrochlorothiazide  Other (See Comments)    Leg cramps   Irbesartan Other (See Comments)    Headaches  Other Reaction(s): excessive cough   Symbicort  [Budesonide -Formoterol  Fumarate] Other (See Comments)    legcramps    Social History   Socioeconomic History    Marital status: Married    Spouse name: Not on file   Number of children: Not on file   Years of education: Not on file   Highest education level: Not on file  Occupational History   Occupation: Retired Magazine features editor: RETIRED  Tobacco Use   Smoking status: Never   Smokeless tobacco: Never   Tobacco comments:    Never smoked 07/10/23  Vaping Use   Vaping status: Never Used  Substance and Sexual Activity   Alcohol use: Yes    Alcohol/week: 2.0 standard drinks of alcohol    Types: 2 Standard drinks or equivalent per week    Comment: socially occasionally    Drug use: No   Sexual activity: Not on file  Other Topics Concern   Not on file  Social History Narrative   Not on file   Social Drivers of Health   Financial Resource Strain: Low Risk  (01/03/2023)   Overall Financial Resource Strain (CARDIA)    Difficulty of Paying Living Expenses: Not hard at all  Food Insecurity: No Food Insecurity (01/03/2023)   Hunger Vital Sign    Worried About Running Out of Food in the Last Year: Never true    Ran Out of Food in the Last Year: Never true  Transportation Needs: No Transportation Needs (01/03/2023)   PRAPARE - Administrator, Civil Service (Medical): No    Lack of Transportation (Non-Medical): No  Physical Activity: Insufficiently Active (01/03/2023)   Exercise Vital Sign    Days of Exercise per Week: 2 days    Minutes of Exercise per Session: 20 min  Stress: No Stress Concern Present (01/03/2023)   [employer] of Occupational Health - Occupational Stress Questionnaire    Feeling of Stress : Not at all  Social Connections: Moderately Integrated (01/03/2023)   Social Connection and Isolation Panel    Frequency of Communication with Friends and Family: More than three times a week    Frequency of Social Gatherings with Friends and Family: Twice a week    Attends Religious Services: More than 4 times per year    Active Member of Clubs or Organizations: No     Attends Banker Meetings: Never    Marital Status: Married   Vitals:   12/09/23 1356  BP: 138/78  Pulse: 62  Resp: 16  Temp: 97.9 F (36.6 C)  SpO2: 96%   Body mass index is 29.23 kg/m.  Physical Exam Vitals and nursing note reviewed.  Constitutional:      General: He is not in acute distress.    Appearance: He is well-developed. He is not ill-appearing.  HENT:     Head: Normocephalic and atraumatic.     Nose: Congestion and rhinorrhea present.     Right Sinus: No maxillary sinus tenderness or frontal sinus tenderness.     Left Sinus: No maxillary sinus tenderness or frontal sinus tenderness.  Mouth/Throat:     Mouth: Mucous membranes are moist.     Pharynx: Postnasal drip present. No pharyngeal swelling, oropharyngeal exudate or posterior oropharyngeal erythema.  Eyes:     Conjunctiva/sclera: Conjunctivae normal.  Cardiovascular:     Rate and Rhythm: Normal rate and regular rhythm.     Heart sounds: No murmur heard. Pulmonary:     Effort: Pulmonary effort is normal. No respiratory distress.     Breath sounds: Normal breath sounds. No stridor.  Lymphadenopathy:     Head:     Right side of head: No submandibular adenopathy.     Left side of head: No submandibular adenopathy.     Cervical: No cervical adenopathy.  Skin:    General: Skin is warm.     Findings: No erythema or rash.  Neurological:     Mental Status: He is alert and oriented to person, place, and time.  Psychiatric:        Mood and Affect: Mood and affect normal.    ASSESSMENT AND PLAN:  URI, acute Symptoms suggests a viral etiology, I explained patient that symptomatic treatment is usually recommended in this case, so I do not think abx is needed at this time. He is feeling better today. Voice rest for dysphonia, mild. Instructed to monitor for signs of complications, including recurrent fever among some, clearly instructed about warning signs. Continue Tylenol  500 mg 3-4 times  per day. Flonase  nasal spray at bedtime for 10 to 14 days then as needed. Nasal saline irrigations also may help with nasal congestion. I also explained that cough and nasal congestion can last a few days and sometimes weeks. F/U as needed.   -     Fluticasone  Propionate; Place 1 spray into both nostrils at bedtime as needed for allergies or rhinitis.  Dispense: 16 g; Refill: 0  Acute cough Problem has been going on for 3 days. No associated dyspnea, wheezing, or CP. Most likely viral. Lung auscultation negative. Fever yesterday, 100.8 F History of DM 2, so recommend chest x-ray, he prefers to hold on this for now. For now recommend symptomatic treatment with benzonatate and OTC plain Mucinex.  -     POC COVID-19 BinaxNow -     POCT Influenza A/B -     Benzonatate; Take 1 capsule (100 mg total) by mouth 2 (two) times daily as needed for up to 10 days.  Dispense: 20 capsule; Refill: 0  Return if symptoms worsen or fail to improve.  Melony Tenpas G. [place], MD  [facility]. [place] office.

## 2023-12-09 NOTE — Patient Instructions (Addendum)
 A few things to remember from today's visit:  Acute cough - Plan: POC COVID-19, POCT Influenza A/B, benzonatate (TESSALON) 100 MG capsule  URI, acute - Plan: fluticasone  (FLONASE ) 50 MCG/ACT nasal spray  viral infections are self-limited and we treat each symptom depending of severity.  Over the counter medications as decongestants and cold medications usually help, they need to be taken with caution if there is a history of high blood pressure or palpitations.In your case I do not recommend it.  Tylenol  also helps with most symptoms (headache, muscle aching, fever,etc), so you can continue it. Plenty of fluids. Honey helps with cough. Nasal irrigations as needed helps with runny nose, nasal congestion, and may prevent sinus infections. Flonase  nasal spray at bedtime for 10-14 days then as needed.  Cough and nasal congestion could last a few days and sometimes weeks.If you change your mind about chest X ray, please let us  know.  Please follow in not any better in 1-2 weeks or if symptoms get worse.  If you send a my chart message, it may take a few days to be addressed, specially if I am not in the office.  Please be sure medication list is accurate. If a new problem present, please set up appointment sooner than planned today.

## 2023-12-23 ENCOUNTER — Other Ambulatory Visit (HOSPITAL_COMMUNITY)
Admission: RE | Admit: 2023-12-23 | Discharge: 2023-12-23 | Disposition: A | Discharge status: Home / Self Care | Source: Ambulatory Visit | Attending: Internal Medicine | Admitting: Internal Medicine

## 2023-12-23 ENCOUNTER — Ambulatory Visit
Admission: RE | Admit: 2023-12-23 | Discharge: 2023-12-23 | Disposition: A | Discharge status: Home / Self Care | Source: Ambulatory Visit | Attending: Internal Medicine | Admitting: Internal Medicine

## 2023-12-23 DIAGNOSIS — E041 Nontoxic single thyroid nodule: Secondary | ICD-10-CM | POA: Diagnosis not present

## 2023-12-23 DIAGNOSIS — E042 Nontoxic multinodular goiter: Secondary | ICD-10-CM

## 2023-12-26 LAB — CYTOLOGY - NON PAP

## 2023-12-30 ENCOUNTER — Other Ambulatory Visit

## 2024-01-06 ENCOUNTER — Encounter (INDEPENDENT_AMBULATORY_CARE_PROVIDER_SITE_OTHER): Payer: Self-pay | Admitting: Physician Assistant

## 2024-01-06 ENCOUNTER — Ambulatory Visit (INDEPENDENT_AMBULATORY_CARE_PROVIDER_SITE_OTHER): Admitting: Physician Assistant

## 2024-01-06 VITALS — BP 138/72 | HR 58

## 2024-01-06 DIAGNOSIS — H6123 Impacted cerumen, bilateral: Secondary | ICD-10-CM | POA: Diagnosis not present

## 2024-01-06 NOTE — Progress Notes (Signed)
 Dear Dr. Johnny, Here is my assessment for our mutual patient, Luke [name]. Thank you for allowing me the opportunity to care for your patient. Please do not hesitate to contact me should you have any other questions. Sincerely, Chyrl Cohen PA-C  Otolaryngology Clinic Note Referring provider: Dr. Johnny HPI:  Luke [name] is a 78 y.o. male kindly referred by Dr. Johnny   Discussed the use of AI scribe software for clinical note transcription with the patient, who gave verbal consent to proceed.  History of Present Illness   Luke [name] is a 78 year old male who presents with earwax buildup. The patient was last seen in the office on 07/05/2023, below is  a recap of that encounter.   The patient is a 78 year old gentleman seen in our office for evaluation of cerumen impaction.  He notes 2 weeks ago he had some itching in his left ear, he notes he scratched it causing some bleeding.  He notes since that time he had had some difficulty with hearing out of the left ear.  Prior to this he notes some very minimal baseline hearing loss.  He notes that he did have a hearing evaluation several years ago at the [facility] that showed some minimal hearing loss nothing significant.  He notes that given the decreased hearing out of his ear he went and saw his primary care provider who diagnosed him with bilateral cerumen impaction.  He notes he has had cerumen impactions previously and has had to have his ears cleaned out several times over the last several years.  He denies any significant pain, drainage, fever.  No associated dizziness.  He has had bilateral tonal tinnitus over the last 5 years which has not worsened.  He denies any history of recurrent ear infections, no head or neck surgery, no head or neck trauma.   Since his last office visit he has not noticed any significant changes in his hearing. No pain or drainage from the ears. He notes that the buildup has occurred again, particularly in the right ear,  which he describes as 'full again'.         Independent Review of Additional Tests or Records:  none   PMH/Meds/All/SocHx/FamHx/ROS:   Past Medical History:  Diagnosis Date   Asthma    Depression    ED (erectile dysfunction)    Hyperlipidemia    Hypertension    Paroxysmal atrial fibrillation (HCC) 09/2016   Initial diagnosis was in setting of acute URI/asthma attack   Solitary pulmonary nodule 04/16/2013   03/30/13  CXR Question left nipple shadow.  10 mm ovoid nodular density right upper lobe, cannot exclude  pulmonary mass/ nodule > f/u cxr 04/29/2013 > no nodule     Stroke [facility])    Ulcer      Past Surgical History:  Procedure Laterality Date   COLONOSCOPY  05/15/2019   per Dr. Jerrell Sol, clear, no repeats needed    NM MYOVIEW  LTD  10/2016   ailable, images/films reviewed: From Epic Chart or Care Everywhere)   TRANSTHORACIC ECHOCARDIOGRAM  09/2016   Normal systolic function. EF 5560%. No RWMA. Gr 1 DD. Mild AoV calcification - no stenosis. Atrial Septum - lipomatous hypertrophy. --Essentially normal.    Family History  Problem Relation Age of Onset   Stroke Mother    Hypertension Mother    Peripheral vascular disease Mother    Hyperlipidemia Father    Coronary artery disease Father    Peripheral vascular disease Father  Hypertension Brother    Coronary artery disease Brother    Cancer Brother        throat, smoked   Emphysema Brother        smoked   Lung cancer Brother        smoked     Social Connections: Moderately Integrated (01/03/2023)   Social Connection and Isolation Panel    Frequency of Communication with Friends and Family: More than three times a week    Frequency of Social Gatherings with Friends and Family: Twice a week    Attends Religious Services: More than 4 times per year    Active Member of [employer] or Organizations: No    Attends Engineer, Structural: Never    Marital Status: Married      Current Outpatient  Medications:    albuterol  (VENTOLIN  HFA) 108 (90 Base) MCG/ACT inhaler, Inhale 2 puffs into the lungs every 6 hrs as needed for Wheezing or SOB, Disp: 18 g, Rfl: 3   amLODipine  (NORVASC ) 10 MG tablet, Take 1 tablet (10 mg total) by mouth daily., Disp: 90 tablet, Rfl: 3   apixaban  (ELIQUIS ) 5 MG TABS tablet, Take 1 tablet (5 mg total) by mouth 2 (two) times daily., Disp: 60 tablet, Rfl: 0   atorvastatin  (LIPITOR ) 40 MG tablet, Take 1 tablet (40 mg total) by mouth daily., Disp: 90 tablet, Rfl: 3   fluticasone  (FLONASE ) 50 MCG/ACT nasal spray, Place 1 spray into both nostrils at bedtime as needed for allergies or rhinitis., Disp: 16 g, Rfl: 0   losartan  (COZAAR ) 50 MG tablet, Take 1 tablet (50 mg total) by mouth daily., Disp: 90 tablet, Rfl: 3   meclizine  (ANTIVERT ) 25 MG tablet, Take 1 tablet (25 mg total) by mouth every 6 (six) hours as needed for dizziness., Disp: 60 tablet, Rfl: 2   metaxalone  (SKELAXIN ) 800 MG tablet, Take 1 tablet (800 mg total) by mouth 3 (three) times daily., Disp: 90 tablet, Rfl: 0   metoprolol  tartrate (LOPRESSOR ) 25 MG tablet, Take 0.5 tablets (12.5 mg total) by mouth 2 (two) times daily., Disp: 30 tablet, Rfl: 5   valACYclovir  (VALTREX ) 1000 MG tablet, Take 1 tablet (1,000 mg total) by mouth 2 (two) times daily as needed (breakouts)., Disp: 60 tablet, Rfl: 5   Physical Exam:   BP 138/72   Pulse (!) 58   SpO2 94%   Pertinent Findings  CN II-XII grossly intact Bilateral cerumen impaction  Anterior rhinoscopy: Septum midline; bilateral inferior turbinates with no hypertrophy No lesions of oral cavity/oropharynx; dentition wnl No obviously palpable neck masses/lymphadenopathy/thyromegaly No respiratory distress or stridor   Seprately Identifiable Procedures:  Procedure: Bilateral ear microscopy and cerumen removal using microscope (CPT [id number]) - Mod 50 Pre-procedure diagnosis: bilateral cerumen impaction external auditory canals Post-procedure diagnosis:  same Indication: bilateral cerumen impaction; given patient's otologic complaints and history as well as for improved and comprehensive examination of external ear and tympanic membrane, bilateral otologic examination using microscope was performed and impacted cerumen removed  Procedure: Patient was placed semi-recumbent. Both ear canals were examined using the microscope with findings above. Cerumen removed from bilateral external auditory canals using suction and currette with improvement in EAC examination and patency. Left: EAC was patent. TM was intact . Middle ear was aerated. Drainage: none Right: EAC was patent. TM was intact . Middle ear was aerated . Drainage: none Patient tolerated the procedure well.   Impression & Plans:  Luke [name] is a 78 y.o. male with the following  Assessment and Plan    Bilateral cerumen impaction Significant cerumen impaction in both ears, more pronounced in the right ear. Hearing unchanged. Cerumen removal performed successfully. - Scheduled follow-up in six months for cerumen management.        - f/u 6 month office visit    Thank you for allowing me the opportunity to care for your patient. Please do not hesitate to contact me should you have any other questions.  Sincerely, Chyrl Cohen PA-C [facility] Phone: [phone] Fax: [phone]  01/06/2024, 2:40 PM

## 2024-01-07 ENCOUNTER — Encounter: Admitting: Family Medicine

## 2024-01-07 ENCOUNTER — Encounter: Payer: Self-pay | Admitting: Family Medicine

## 2024-01-07 NOTE — Progress Notes (Unsigned)
 A user error has taken place: encounter opened in error, closed for administrative reasons.

## 2024-01-09 ENCOUNTER — Ambulatory Visit (HOSPITAL_COMMUNITY)
Admission: RE | Admit: 2024-01-09 | Discharge: 2024-01-09 | Disposition: A | Discharge status: Home / Self Care | Source: Ambulatory Visit | Attending: Physician Assistant | Admitting: Physician Assistant

## 2024-01-09 VITALS — BP 132/68 | HR 59 | Ht 67.0 in | Wt 184.0 lb

## 2024-01-09 DIAGNOSIS — I480 Paroxysmal atrial fibrillation: Secondary | ICD-10-CM | POA: Insufficient documentation

## 2024-01-09 DIAGNOSIS — D6869 Other thrombophilia: Secondary | ICD-10-CM | POA: Insufficient documentation

## 2024-01-09 DIAGNOSIS — I4891 Unspecified atrial fibrillation: Secondary | ICD-10-CM | POA: Diagnosis not present

## 2024-01-09 MED ORDER — METOPROLOL TARTRATE 25 MG PO TABS: 25.0000 mg | ORAL_TABLET | Freq: Two times a day (BID) | ORAL | 6 refills | Status: DC

## 2024-01-09 NOTE — Progress Notes (Signed)
 Primary Care Physician: Johnny Garnette LABOR, MD Primary Cardiologist: Lynwood Schilling, MD Electrophysiologist: None  Referring Physician: ED   Luke [name] is a 78 y.o. male with a history of HTN, carotid artery disease, HLD, CVA, atrial fibrillation who presents for follow up in the [facility].  He was found to have afib in the context of a URI.  He had a negative ischemia work up and unremarkable echo. At that time, he did not want to continue with the DOAC. He was later hospitalized with a CVA. Patient is on Eliquis  for stroke prevention.   He was seen at the ED 06/30/23 for atrial fibrillation. He was offered DCCV at that time but elected for outpatient follow up as he was fairly well rate controlled. He later spontaneously converted.   Patient returns for follow up for atrial fibrillation. He is in SR today and feels well. He denies any interim symptoms of afib. He does have very brief palpitations and when he checks his Apple Watch it shows two beats close together. No bleeding issues on anticoagulation.   Today, he  denies symptoms of chest pain, shortness of breath, orthopnea, PND, lower extremity edema, dizziness, presyncope, syncope, snoring, daytime somnolence, bleeding, or neurologic sequela. The patient is tolerating medications without difficulties and is otherwise without complaint today.    Atrial Fibrillation Risk Factors:  he does not have symptoms or diagnosis of sleep apnea. he does not have a history of rheumatic fever. he does not have a history of alcohol use. The patient does not have a history of early familial atrial fibrillation or other arrhythmias.  Atrial Fibrillation Management history:  Previous antiarrhythmic drugs: none Previous cardioversions: none Previous ablations: none Anticoagulation history: Eliquis   ROS- All systems are reviewed and negative except as per the HPI above.  Past Medical History:  Diagnosis Date    Asthma    Depression    ED (erectile dysfunction)    Hyperlipidemia    Hypertension    Paroxysmal atrial fibrillation (HCC) 09/2016   Initial diagnosis was in setting of acute URI/asthma attack   Solitary pulmonary nodule 04/16/2013   03/30/13  CXR Question left nipple shadow.  10 mm ovoid nodular density right upper lobe, cannot exclude  pulmonary mass/ nodule > f/u cxr 04/29/2013 > no nodule     Stroke [facility])    Ulcer     Current Outpatient Medications  Medication Sig Dispense Refill   albuterol  (VENTOLIN  HFA) 108 (90 Base) MCG/ACT inhaler Inhale 2 puffs into the lungs every 6 hrs as needed for Wheezing or SOB 18 g 3   amLODipine  (NORVASC ) 10 MG tablet Take 1 tablet (10 mg total) by mouth daily. 90 tablet 3   apixaban  (ELIQUIS ) 5 MG TABS tablet Take 1 tablet (5 mg total) by mouth 2 (two) times daily. 60 tablet 0   atorvastatin  (LIPITOR ) 40 MG tablet Take 1 tablet (40 mg total) by mouth daily. 90 tablet 3   fluticasone  (FLONASE ) 50 MCG/ACT nasal spray Place 1 spray into both nostrils at bedtime as needed for allergies or rhinitis. 16 g 0   losartan  (COZAAR ) 50 MG tablet Take 1 tablet (50 mg total) by mouth daily. 90 tablet 3   meclizine  (ANTIVERT ) 25 MG tablet Take 1 tablet (25 mg total) by mouth every 6 (six) hours as needed for dizziness. (Patient taking differently: Take 25 mg by mouth as needed for dizziness.) 60 tablet 2   metaxalone  (SKELAXIN ) 800 MG tablet Take  1 tablet (800 mg total) by mouth 3 (three) times daily. (Patient taking differently: Take 800 mg by mouth as needed.) 90 tablet 0   metoprolol  tartrate (LOPRESSOR ) 25 MG tablet Take 0.5 tablets (12.5 mg total) by mouth 2 (two) times daily. 30 tablet 5   valACYclovir  (VALTREX ) 1000 MG tablet Take 1 tablet (1,000 mg total) by mouth 2 (two) times daily as needed (breakouts). 60 tablet 5   No current facility-administered medications for this encounter.    Physical Exam: BP 132/68   Pulse (!) 59   Ht 5' 7 (1.702 m)   Wt  83.5 kg   BMI 28.82 kg/m   GEN: Well nourished, well developed in no acute distress CARDIAC: Regular rate and rhythm, no murmurs, rubs, gallops RESPIRATORY:  Clear to auscultation without rales, wheezing or rhonchi  ABDOMEN: Soft, non-tender, non-distended EXTREMITIES:  No edema; No deformity    Wt Readings from Last 3 Encounters:  01/09/24 83.5 kg  12/09/23 84.6 kg  11/28/23 83.9 kg     EKG today demonstrates  SB, RBBB Vent. rate 59 BPM PR interval 158 ms QRS duration 144 ms QT/QTcB 450/445 ms   Echo 03/14/23 demonstrated   1. Left ventricular ejection fraction, by estimation, is 55 to 60%. The  left ventricle has normal function. The left ventricle has no regional  wall motion abnormalities. There is mild left ventricular hypertrophy.  Left ventricular diastolic parameters are consistent with Grade I diastolic dysfunction (impaired relaxation).   2. Right ventricular systolic function is normal. The right ventricular  size is normal. Tricuspid regurgitation signal is inadequate for assessing  PA pressure.   3. The mitral valve was not well visualized. No evidence of mitral valve  regurgitation.   4. The aortic valve was not well visualized. Aortic valve regurgitation  is not visualized.   5. There is moderate dilatation of the ascending aorta, measuring 39 mm.   6. The inferior vena cava is normal in size with greater than 50%  respiratory variability, suggesting right atrial pressure of 3 mmHg.    CHA2DS2-VASc Score = 6  The patient's score is based upon: CHF History: 0 HTN History: 1 Diabetes History: 0 Stroke History: 2 Vascular Disease History: 1 Age Score: 2 Gender Score: 0       ASSESSMENT AND PLAN: Paroxysmal Atrial Fibrillation (ICD10:  I48.0) The patient's CHA2DS2-VASc score is 6, indicating a 9.7% annual risk of stroke.   Patient appears to be maintaining SR.  Patient having what sounds like PVCs. Apple Watch strips not available for review today.  Will increase Lopressor  to 25 mg BID. Continue Eliquis  5 mg BID Apple Watch for home monitoring.   Secondary Hypercoagulable State (ICD10:  D68.69) The patient is at significant risk for stroke/thromboembolism based upon his CHA2DS2-VASc Score of 6.  Continue Apixaban  (Eliquis ). No bleeding issues.   HTN Stable on current regimen  Follow up in the AF clinic in 6 months.     Daril Kicks PA-C Afib Clinic [facility] [address] [place], [place] [address] [phone]

## 2024-01-09 NOTE — Patient Instructions (Signed)
 Increase metoprolol to 25mg  twice a day

## 2024-01-22 ENCOUNTER — Encounter: Payer: Self-pay | Admitting: Family Medicine

## 2024-01-22 ENCOUNTER — Ambulatory Visit: Admitting: Family Medicine

## 2024-01-22 VITALS — BP 120/60 | HR 59 | Temp 98.9°F | Wt 184.0 lb

## 2024-01-22 DIAGNOSIS — G4700 Insomnia, unspecified: Secondary | ICD-10-CM | POA: Diagnosis not present

## 2024-01-22 DIAGNOSIS — N401 Enlarged prostate with lower urinary tract symptoms: Secondary | ICD-10-CM

## 2024-01-22 DIAGNOSIS — N138 Other obstructive and reflux uropathy: Secondary | ICD-10-CM

## 2024-01-22 MED ORDER — TRAZODONE HCL 50 MG PO TABS: 50.0000 mg | ORAL_TABLET | Freq: Every day | ORAL | 5 refills | Status: DC

## 2024-01-22 MED ORDER — TAMSULOSIN HCL 0.4 MG PO CAPS: 0.4000 mg | ORAL_CAPSULE | Freq: Every day | ORAL | 5 refills | Status: DC

## 2024-01-22 NOTE — Progress Notes (Signed)
   Subjective:    Patient ID: Luke [name], male    DOB: 11-24-1945, 78 y.o.   MRN: [id number]  HPI Here to discuss sleep issues. These have been gong on for about a year, but they have gotten worse. He falls asleep quickly every night as a result of taking Tylenol  PM. However then wakes up around 2 or 3 am, and he feels the need to urinate. He then falls back asleep and his to get up to urinate again. This happens 4-5 times a night. He does not have urinary urgency during the day, but his stream is slow and it can take a long time to start or to stop passing urine. No discomfort. He had a prostate exam and a normal PSA this ast January.    Review of Systems  Constitutional: Negative.   Respiratory: Negative.    Cardiovascular: Negative.   Genitourinary:  Positive for frequency. Negative for dysuria, hematuria and urgency.       Objective:   Physical Exam Constitutional:      Appearance: Normal appearance.  Cardiovascular:     Rate and Rhythm: Normal rate and regular rhythm.     Pulses: Normal pulses.     Heart sounds: Normal heart sounds.  Pulmonary:     Effort: Pulmonary effort is normal.     Breath sounds: Normal breath sounds.  Neurological:     Mental Status: He is alert.           Assessment & Plan:  He has some BPH, so he will try taking Tamsulosin 0.4 mg at bedtime. For the insomnia, he will stop the Tylenol  PM and instead try Trazodone 50 mg at bedtime.  Garnette Olmsted, MD

## 2024-01-28 ENCOUNTER — Encounter: Payer: Self-pay | Admitting: Family Medicine

## 2024-01-29 NOTE — Telephone Encounter (Signed)
 Rather than another RX medication, I suggest he stay with the Tylenol  PM. He could add some melatonin to this if he wants (5-10 mg )

## 2024-01-29 NOTE — Telephone Encounter (Signed)
 Please advise

## 2024-04-03 ENCOUNTER — Ambulatory Visit

## 2024-04-03 VITALS — BP 118/62 | HR 62 | Temp 98.8°F | Ht 67.0 in | Wt 183.4 lb

## 2024-04-03 DIAGNOSIS — Z0000 Encounter for general adult medical examination without abnormal findings: Secondary | ICD-10-CM | POA: Diagnosis not present

## 2024-04-08 ENCOUNTER — Ambulatory Visit: Admitting: Family Medicine

## 2024-04-08 ENCOUNTER — Encounter: Payer: Self-pay | Admitting: Family Medicine

## 2024-04-08 VITALS — BP 120/68 | HR 48 | Temp 98.5°F | Ht 67.0 in | Wt 184.0 lb

## 2024-04-08 DIAGNOSIS — E785 Hyperlipidemia, unspecified: Secondary | ICD-10-CM

## 2024-04-08 DIAGNOSIS — R739 Hyperglycemia, unspecified: Secondary | ICD-10-CM

## 2024-04-08 DIAGNOSIS — I480 Paroxysmal atrial fibrillation: Secondary | ICD-10-CM

## 2024-04-08 DIAGNOSIS — N401 Enlarged prostate with lower urinary tract symptoms: Secondary | ICD-10-CM

## 2024-04-08 DIAGNOSIS — I10 Essential (primary) hypertension: Secondary | ICD-10-CM

## 2024-04-08 DIAGNOSIS — K219 Gastro-esophageal reflux disease without esophagitis: Secondary | ICD-10-CM

## 2024-04-08 DIAGNOSIS — J4520 Mild intermittent asthma, uncomplicated: Secondary | ICD-10-CM

## 2024-04-08 LAB — LIPID PANEL
Cholesterol: 119 mg/dL (ref 28–200)
HDL: 29.8 mg/dL — ABNORMAL LOW
LDL Cholesterol: 52 mg/dL (ref 10–99)
NonHDL: 89.54
Total CHOL/HDL Ratio: 4
Triglycerides: 188 mg/dL — ABNORMAL HIGH (ref 10.0–149.0)
VLDL: 37.6 mg/dL (ref 0.0–40.0)

## 2024-04-08 LAB — HEPATIC FUNCTION PANEL
ALT: 16 U/L (ref 3–53)
AST: 13 U/L (ref 5–37)
Albumin: 3.8 g/dL (ref 3.5–5.2)
Alkaline Phosphatase: 91 U/L (ref 39–117)
Bilirubin, Direct: 0.1 mg/dL (ref 0.1–0.3)
Total Bilirubin: 0.4 mg/dL (ref 0.2–1.2)
Total Protein: 6.3 g/dL (ref 6.0–8.3)

## 2024-04-08 LAB — CBC WITH DIFFERENTIAL/PLATELET
Basophils Absolute: 0 10*3/uL (ref 0.0–0.1)
Basophils Relative: 0.4 % (ref 0.0–3.0)
Eosinophils Absolute: 0.5 10*3/uL (ref 0.0–0.7)
Eosinophils Relative: 4.5 % (ref 0.0–5.0)
HCT: 41.8 % (ref 39.0–52.0)
Hemoglobin: 13.9 g/dL (ref 13.0–17.0)
Lymphocytes Relative: 26.6 % (ref 12.0–46.0)
Lymphs Abs: 2.8 10*3/uL (ref 0.7–4.0)
MCHC: 33.4 g/dL (ref 30.0–36.0)
MCV: 83.8 fl (ref 78.0–100.0)
Monocytes Absolute: 0.6 10*3/uL (ref 0.1–1.0)
Monocytes Relative: 5.5 % (ref 3.0–12.0)
Neutro Abs: 6.7 10*3/uL (ref 1.4–7.7)
Neutrophils Relative %: 63 % (ref 43.0–77.0)
Platelets: 298 10*3/uL (ref 150.0–400.0)
RBC: 4.99 Mil/uL (ref 4.22–5.81)
RDW: 14.1 % (ref 11.5–15.5)
WBC: 10.6 10*3/uL — ABNORMAL HIGH (ref 4.0–10.5)

## 2024-04-08 LAB — HEMOGLOBIN A1C: Hgb A1c MFr Bld: 6.7 % — ABNORMAL HIGH (ref 4.6–6.5)

## 2024-04-08 LAB — PSA: PSA: 2.7 ng/mL (ref 0.10–4.00)

## 2024-04-08 LAB — BASIC METABOLIC PANEL WITH GFR
BUN: 16 mg/dL (ref 6–23)
CO2: 24 meq/L (ref 19–32)
Calcium: 8.9 mg/dL (ref 8.4–10.5)
Chloride: 107 meq/L (ref 96–112)
Creatinine, Ser: 0.94 mg/dL (ref 0.40–1.50)
GFR: 77.89 mL/min
Glucose, Bld: 108 mg/dL — ABNORMAL HIGH (ref 70–99)
Potassium: 4.2 meq/L (ref 3.5–5.1)
Sodium: 140 meq/L (ref 135–145)

## 2024-04-08 LAB — TSH: TSH: 1.55 u[IU]/mL (ref 0.35–5.50)

## 2024-04-08 MED ORDER — LOSARTAN POTASSIUM 50 MG PO TABS: 50.0000 mg | ORAL_TABLET | Freq: Every day | ORAL | 3 refills | Status: AC

## 2024-04-08 MED ORDER — AMLODIPINE BESYLATE 10 MG PO TABS: 10.0000 mg | ORAL_TABLET | Freq: Every day | ORAL | 3 refills | Status: AC

## 2024-04-08 MED ORDER — METOPROLOL TARTRATE 25 MG PO TABS: 25.0000 mg | ORAL_TABLET | Freq: Two times a day (BID) | ORAL | 3 refills | Status: AC

## 2024-04-08 NOTE — Progress Notes (Signed)
 "  Subjective:    Patient ID: Luke [name], male    DOB: [birth year], 79 y.o.   MRN: [id number]  HPI Here to follow up on issues. He feels well in general. His HTN is stable, and he has been in sinus rhythm for the past 2 years. He sees the atrial fibrillation clinic twice a year. He gets Eliquis  from the [facility]. We recently let him try Trazodone  for sleep, but this made him groggy the next day, so he stopped it. He is now using Tylenol  PM again, and he is sleeping well. He also tried Tamsulosin  for BPH symptoms, but this made him feel weak so he stopped it. His asthma is stable. His GERD is stable.    Review of Systems  Constitutional: Negative.   HENT: Negative.    Eyes: Negative.   Respiratory: Negative.    Cardiovascular: Negative.   Gastrointestinal: Negative.   Genitourinary:  Positive for frequency.  Musculoskeletal: Negative.   Skin: Negative.   Neurological: Negative.   Psychiatric/Behavioral: Negative.         Objective:   Physical Exam Constitutional:      General: He is not in acute distress.    Appearance: Normal appearance. He is well-developed. He is not diaphoretic.  HENT:     Head: Normocephalic and atraumatic.     Right Ear: External ear normal.     Left Ear: External ear normal.     Nose: Nose normal.     Mouth/Throat:     Pharynx: No oropharyngeal exudate.  Eyes:     General: No scleral icterus.       Right eye: No discharge.        Left eye: No discharge.     Conjunctiva/sclera: Conjunctivae normal.     Pupils: Pupils are equal, round, and reactive to light.  Neck:     Thyroid : No thyromegaly.     Vascular: No JVD.     Trachea: No tracheal deviation.  Cardiovascular:     Rate and Rhythm: Normal rate and regular rhythm.     Pulses: Normal pulses.     Heart sounds: Normal heart sounds. No murmur heard.    No friction rub. No gallop.  Pulmonary:     Effort: Pulmonary effort is normal. No respiratory distress.     Breath sounds: Normal breath  sounds. No wheezing or rales.  Chest:     Chest wall: No tenderness.  Abdominal:     General: Bowel sounds are normal. There is no distension.     Palpations: Abdomen is soft. There is no mass.     Tenderness: There is no abdominal tenderness. There is no guarding or rebound.  Genitourinary:    Penis: Normal. No tenderness.      Testes: Normal.  Musculoskeletal:        General: No tenderness. Normal range of motion.     Cervical back: Neck supple.  Lymphadenopathy:     Cervical: No cervical adenopathy.  Skin:    General: Skin is warm and dry.     Coloration: Skin is not pale.     Findings: No erythema or rash.  Neurological:     General: No focal deficit present.     Mental Status: He is alert and oriented to person, place, and time.     Cranial Nerves: No cranial nerve deficit.     Motor: No abnormal muscle tone.     Coordination: Coordination normal.     Deep Tendon Reflexes:  Reflexes are normal and symmetric. Reflexes normal.  Psychiatric:        Mood and Affect: Mood normal.        Behavior: Behavior normal.        Thought Content: Thought content normal.        Judgment: Judgment normal.           Assessment & Plan:  His HTN is stable, and he has not shown any atrial fibrillation for the past 2 years. His GERD and asthma are stable. We will get labs to check lipids, PSA, etc today. I personally spent a total of 34 minutes in the care of the patient today including getting/reviewing separately obtained history, performing a medically appropriate exam/evaluation, counseling and educating, and placing orders.  Garnette Olmsted, MD   "

## 2024-04-09 ENCOUNTER — Ambulatory Visit: Payer: Self-pay | Admitting: Family Medicine

## 2024-04-14 ENCOUNTER — Ambulatory Visit: Admitting: Family Medicine

## 2024-07-06 ENCOUNTER — Ambulatory Visit (INDEPENDENT_AMBULATORY_CARE_PROVIDER_SITE_OTHER): Admitting: Physician Assistant

## 2024-07-09 ENCOUNTER — Ambulatory Visit (HOSPITAL_COMMUNITY): Admitting: Physician Assistant

## 2025-04-09 ENCOUNTER — Ambulatory Visit
